# Patient Record
Sex: Male | Born: 1955
Health system: Southern US, Community
[De-identification: ages and names within clinical notes are randomized; demographics above are authoritative.]

## PROBLEM LIST (undated history)

## (undated) DIAGNOSIS — C61 Malignant neoplasm of prostate: Secondary | ICD-10-CM

## (undated) DIAGNOSIS — R911 Solitary pulmonary nodule: Secondary | ICD-10-CM

## (undated) DIAGNOSIS — I5022 Chronic systolic (congestive) heart failure: Secondary | ICD-10-CM

## (undated) DIAGNOSIS — Z923 Personal history of irradiation: Secondary | ICD-10-CM

## (undated) DIAGNOSIS — Z23 Encounter for immunization: Secondary | ICD-10-CM

## (undated) DIAGNOSIS — E785 Hyperlipidemia, unspecified: Secondary | ICD-10-CM

## (undated) DIAGNOSIS — I428 Other cardiomyopathies: Secondary | ICD-10-CM

## (undated) DIAGNOSIS — I509 Heart failure, unspecified: Secondary | ICD-10-CM

## (undated) DIAGNOSIS — I5042 Chronic combined systolic (congestive) and diastolic (congestive) heart failure: Secondary | ICD-10-CM

## (undated) DIAGNOSIS — N189 Chronic kidney disease, unspecified: Secondary | ICD-10-CM

## (undated) DIAGNOSIS — L409 Psoriasis, unspecified: Secondary | ICD-10-CM

## (undated) DIAGNOSIS — F1010 Alcohol abuse, uncomplicated: Secondary | ICD-10-CM

## (undated) DIAGNOSIS — I10 Essential (primary) hypertension: Secondary | ICD-10-CM

## (undated) DIAGNOSIS — M109 Gout, unspecified: Secondary | ICD-10-CM

## (undated) HISTORY — DX: Other cardiomyopathies: I42.8

## (undated) HISTORY — DX: Encounter for immunization: Z23

## (undated) HISTORY — PX: POLYPECTOMY: SHX149

## (undated) HISTORY — PX: SKIN GRAFT: SHX250

## (undated) HISTORY — DX: Personal history of irradiation: Z92.3

## (undated) HISTORY — PX: KNEE SURGERY: SHX244

## (undated) HISTORY — PX: COLONOSCOPY: SHX174

## (undated) HISTORY — DX: Heart failure, unspecified: I50.9

## (undated) HISTORY — DX: Chronic systolic (congestive) heart failure: I50.22

## (undated) HISTORY — DX: Hyperlipidemia, unspecified: E78.5

## (undated) HISTORY — DX: Solitary pulmonary nodule: R91.1

---

## 2004-08-31 ENCOUNTER — Ambulatory Visit (HOSPITAL_COMMUNITY): Admission: RE | Admit: 2004-08-31 | Discharge: 2004-08-31 | Payer: Self-pay | Admitting: *Deleted

## 2012-05-20 ENCOUNTER — Inpatient Hospital Stay (HOSPITAL_COMMUNITY)
Admission: EM | Admit: 2012-05-20 | Discharge: 2012-05-26 | DRG: 287 | Disposition: A | Discharge status: Home / Self Care | Payer: Medicaid Other | Attending: Family Medicine | Admitting: Family Medicine

## 2012-05-20 ENCOUNTER — Emergency Department (HOSPITAL_COMMUNITY): Payer: Medicaid Other

## 2012-05-20 ENCOUNTER — Encounter (HOSPITAL_COMMUNITY): Payer: Self-pay | Admitting: *Deleted

## 2012-05-20 DIAGNOSIS — I440 Atrioventricular block, first degree: Secondary | ICD-10-CM | POA: Diagnosis present

## 2012-05-20 DIAGNOSIS — I509 Heart failure, unspecified: Secondary | ICD-10-CM | POA: Diagnosis present

## 2012-05-20 DIAGNOSIS — I2510 Atherosclerotic heart disease of native coronary artery without angina pectoris: Secondary | ICD-10-CM | POA: Diagnosis present

## 2012-05-20 DIAGNOSIS — IMO0002 Reserved for concepts with insufficient information to code with codable children: Secondary | ICD-10-CM | POA: Diagnosis present

## 2012-05-20 DIAGNOSIS — I5021 Acute systolic (congestive) heart failure: Principal | ICD-10-CM | POA: Diagnosis present

## 2012-05-20 DIAGNOSIS — I5023 Acute on chronic systolic (congestive) heart failure: Secondary | ICD-10-CM

## 2012-05-20 DIAGNOSIS — I10 Essential (primary) hypertension: Secondary | ICD-10-CM | POA: Diagnosis present

## 2012-05-20 DIAGNOSIS — R0789 Other chest pain: Secondary | ICD-10-CM

## 2012-05-20 DIAGNOSIS — R7989 Other specified abnormal findings of blood chemistry: Secondary | ICD-10-CM

## 2012-05-20 DIAGNOSIS — R17 Unspecified jaundice: Secondary | ICD-10-CM | POA: Diagnosis present

## 2012-05-20 DIAGNOSIS — F17200 Nicotine dependence, unspecified, uncomplicated: Secondary | ICD-10-CM | POA: Diagnosis present

## 2012-05-20 DIAGNOSIS — E875 Hyperkalemia: Secondary | ICD-10-CM | POA: Diagnosis not present

## 2012-05-20 DIAGNOSIS — N289 Disorder of kidney and ureter, unspecified: Secondary | ICD-10-CM

## 2012-05-20 DIAGNOSIS — F1020 Alcohol dependence, uncomplicated: Secondary | ICD-10-CM | POA: Diagnosis present

## 2012-05-20 DIAGNOSIS — R945 Abnormal results of liver function studies: Secondary | ICD-10-CM | POA: Diagnosis present

## 2012-05-20 DIAGNOSIS — N179 Acute kidney failure, unspecified: Secondary | ICD-10-CM | POA: Diagnosis present

## 2012-05-20 DIAGNOSIS — K7030 Alcoholic cirrhosis of liver without ascites: Secondary | ICD-10-CM | POA: Diagnosis present

## 2012-05-20 DIAGNOSIS — L408 Other psoriasis: Secondary | ICD-10-CM | POA: Diagnosis present

## 2012-05-20 DIAGNOSIS — R0989 Other specified symptoms and signs involving the circulatory and respiratory systems: Secondary | ICD-10-CM

## 2012-05-20 DIAGNOSIS — R188 Other ascites: Secondary | ICD-10-CM | POA: Diagnosis present

## 2012-05-20 DIAGNOSIS — Z8249 Family history of ischemic heart disease and other diseases of the circulatory system: Secondary | ICD-10-CM

## 2012-05-20 DIAGNOSIS — R9430 Abnormal result of cardiovascular function study, unspecified: Secondary | ICD-10-CM | POA: Diagnosis present

## 2012-05-20 DIAGNOSIS — F1010 Alcohol abuse, uncomplicated: Secondary | ICD-10-CM

## 2012-05-20 DIAGNOSIS — R140 Abdominal distension (gaseous): Secondary | ICD-10-CM | POA: Diagnosis present

## 2012-05-20 HISTORY — DX: Psoriasis, unspecified: L40.9

## 2012-05-20 HISTORY — DX: Alcohol abuse, uncomplicated: F10.10

## 2012-05-20 HISTORY — DX: Essential (primary) hypertension: I10

## 2012-05-20 LAB — COMPREHENSIVE METABOLIC PANEL
ALT: 64 U/L — ABNORMAL HIGH (ref 0–53)
AST: 48 U/L — ABNORMAL HIGH (ref 0–37)
Albumin: 3.6 g/dL (ref 3.5–5.2)
Alkaline Phosphatase: 73 U/L (ref 39–117)
BUN: 25 mg/dL — ABNORMAL HIGH (ref 6–23)
Calcium: 9.7 mg/dL (ref 8.4–10.5)
Chloride: 104 mEq/L (ref 96–112)
GFR calc Af Amer: 53 mL/min — ABNORMAL LOW (ref >90)
Glucose, Bld: 104 mg/dL — ABNORMAL HIGH (ref 70–99)
Potassium: 4.7 mEq/L (ref 3.5–5.1)
Sodium: 142 mEq/L (ref 135–145)
Total Bilirubin: 1.2 mg/dL (ref 0.3–1.2)
Total Protein: 7 g/dL (ref 6.0–8.3)
Total Protein: 7.3 g/dL (ref 6.0–8.3)

## 2012-05-20 LAB — GLUCOSE, CAPILLARY: Glucose-Capillary: 105 mg/dL — ABNORMAL HIGH (ref 70–99)

## 2012-05-20 LAB — CBC
HCT: 48.9 % (ref 39.0–52.0)
Hemoglobin: 16.7 g/dL (ref 13.0–17.0)
WBC: 6.5 10*3/uL (ref 4.0–10.5)

## 2012-05-20 LAB — CBC WITH DIFFERENTIAL/PLATELET
Eosinophils Absolute: 0.2 10*3/uL (ref 0.0–0.7)
Eosinophils Relative: 2 % (ref 0–5)
HCT: 47.4 % (ref 39.0–52.0)
Lymphocytes Relative: 28 % (ref 12–46)
Lymphs Abs: 1.9 10*3/uL (ref 0.7–4.0)
MCH: 30.9 pg (ref 26.0–34.0)
MCV: 89.9 fL (ref 78.0–100.0)
Monocytes Absolute: 0.9 10*3/uL (ref 0.1–1.0)
RBC: 5.27 MIL/uL (ref 4.22–5.81)
RDW: 15.3 % (ref 11.5–15.5)
WBC: 6.7 10*3/uL (ref 4.0–10.5)

## 2012-05-20 LAB — CARDIAC PANEL(CRET KIN+CKTOT+MB+TROPI)
CK, MB: 2 ng/mL (ref 0.3–4.0)
Troponin I: <0.3 ng/mL (ref <0.30)

## 2012-05-20 LAB — PRO B NATRIURETIC PEPTIDE: Pro B Natriuretic peptide (BNP): 3473 pg/mL — ABNORMAL HIGH (ref 0–125)

## 2012-05-20 MED ORDER — SIMVASTATIN 20 MG PO TABS
20.0000 mg | ORAL_TABLET | Freq: Every day | ORAL | Status: DC
Start: 2012-05-21 — End: 2012-05-26
  Administered 2012-05-21 – 2012-05-25 (×5): 20 mg via ORAL
  Filled 2012-05-20 (×6): qty 1

## 2012-05-20 MED ORDER — SODIUM CHLORIDE 0.9 % IJ SOLN
3.0000 mL | Freq: Two times a day (BID) | INTRAMUSCULAR | Status: DC
  Administered 2012-05-20 – 2012-05-26 (×11): 3 mL via INTRAVENOUS

## 2012-05-20 MED ORDER — ASPIRIN 81 MG PO CHEW
324.0000 mg | CHEWABLE_TABLET | Freq: Once | ORAL | Status: AC
  Administered 2012-05-20: 324 mg via ORAL
  Filled 2012-05-20: qty 4

## 2012-05-20 MED ORDER — ALBUTEROL SULFATE HFA 108 (90 BASE) MCG/ACT IN AERS
2.0000 | INHALATION_SPRAY | RESPIRATORY_TRACT | Status: DC
  Administered 2012-05-21 (×3): 2 via RESPIRATORY_TRACT
  Filled 2012-05-20: qty 6.7

## 2012-05-20 MED ORDER — METOPROLOL TARTRATE 1 MG/ML IV SOLN: 5.0000 mg | Freq: Four times a day (QID) | INTRAVENOUS | Status: DC | PRN

## 2012-05-20 MED ORDER — ATENOLOL 100 MG PO TABS
100.0000 mg | ORAL_TABLET | Freq: Every day | ORAL | Status: DC
  Administered 2012-05-21: 100 mg via ORAL
  Filled 2012-05-20: qty 1

## 2012-05-20 MED ORDER — ACETAMINOPHEN 325 MG PO TABS: 650.0000 mg | ORAL_TABLET | ORAL | Status: DC | PRN

## 2012-05-20 MED ORDER — SODIUM CHLORIDE 0.9 % IJ SOLN: 3.0000 mL | INTRAMUSCULAR | Status: DC | PRN

## 2012-05-20 MED ORDER — LISINOPRIL 20 MG PO TABS
20.0000 mg | ORAL_TABLET | Freq: Every day | ORAL | Status: DC
  Administered 2012-05-21 – 2012-05-26 (×6): 20 mg via ORAL
  Filled 2012-05-20 (×6): qty 1

## 2012-05-20 MED ORDER — NITROGLYCERIN 0.4 MG SL SUBL: 0.4000 mg | SUBLINGUAL_TABLET | SUBLINGUAL | Status: DC | PRN

## 2012-05-20 MED ORDER — ASPIRIN EC 81 MG PO TBEC
81.0000 mg | DELAYED_RELEASE_TABLET | Freq: Every day | ORAL | Status: DC
  Administered 2012-05-21 – 2012-05-26 (×5): 81 mg via ORAL
  Filled 2012-05-20 (×6): qty 1

## 2012-05-20 MED ORDER — ONDANSETRON HCL 4 MG/2ML IJ SOLN: 4.0000 mg | Freq: Four times a day (QID) | INTRAMUSCULAR | Status: DC | PRN

## 2012-05-20 MED ORDER — ENOXAPARIN SODIUM 40 MG/0.4ML ~~LOC~~ SOLN
40.0000 mg | SUBCUTANEOUS | Status: DC
  Administered 2012-05-20 – 2012-05-24 (×5): 40 mg via SUBCUTANEOUS
  Filled 2012-05-20 (×6): qty 0.4

## 2012-05-20 MED ORDER — PANTOPRAZOLE SODIUM 40 MG PO TBEC
40.0000 mg | DELAYED_RELEASE_TABLET | Freq: Every day | ORAL | Status: DC
  Administered 2012-05-21 – 2012-05-26 (×6): 40 mg via ORAL
  Filled 2012-05-20 (×6): qty 1

## 2012-05-20 MED ORDER — SODIUM CHLORIDE 0.9 % IV SOLN: 250.0000 mL | INTRAVENOUS | Status: DC | PRN

## 2012-05-20 NOTE — ED Provider Notes (Cosign Needed)
History     CSN: [id number]  Arrival date & time 05/20/12  1524   First MD Initiated Contact with Patient 05/20/12 1655      Chief Complaint  Patient presents with  . Chest Pain  . Shortness of Breath    (Consider location/radiation/quality/duration/timing/severity/associated sxs/prior treatment) Patient is a 56 y.o. male presenting with chest pain and shortness of breath. The history is provided by the patient.  Chest Pain Primary symptoms include shortness of breath. Pertinent negatives for primary symptoms include no fever, no cough and no abdominal pain.    Shortness of Breath  Associated symptoms include chest pain and shortness of breath. Pertinent negatives include no fever and no cough.  pt c/o episodes chest tightness, and sob/exhaustion w minimal activity/exertion for the past month. States occurs w walking a block or less. w rest, symptoms will resolve. No hx cad. States had unspecific heart tests years ago which were okay then. Denies family hx premature cad. Pain when present is not pleuritic. Pain is intermittent, w exertion, lasting several minutes. No cp currently. Denies cough or uri c/o no fever or chills. No leg pain or swelling. No dvt or pe hx. No hx chf.     Past Medical History  Diagnosis Date  . Hypertension     History reviewed. No pertinent past surgical history.  No family history on file.  History  Substance Use Topics  . Smoking status: Current Some Day Smoker  . Smokeless tobacco: Not on file  . Alcohol Use: Yes     daily      Review of Systems  Constitutional: Negative for fever and chills.  HENT: Negative for neck pain.   Eyes: Negative for redness.  Respiratory: Positive for shortness of breath. Negative for cough.   Cardiovascular: Positive for chest pain. Negative for leg swelling.  Gastrointestinal: Negative for abdominal pain.  Genitourinary: Negative for flank pain.  Musculoskeletal: Negative for back pain.  Skin: Negative  for rash.  Neurological: Negative for headaches.  Hematological: Does not bruise/bleed easily.  Psychiatric/Behavioral: Negative for confusion.    Allergies  Review of patient's allergies indicates no known allergies.  Home Medications   Current Outpatient Rx  Name Route Sig Dispense Refill  . ATENOLOL 100 MG PO TABS Oral Take 100 mg by mouth daily.    Marland Kitchen LISINOPRIL 20 MG PO TABS Oral Take 20 mg by mouth daily.      BP 170/124  Pulse 60  Temp 97.8 F (36.6 C) (Oral)  Resp 21  SpO2 98%  Physical Exam  Nursing note and vitals reviewed. Constitutional: He is oriented to person, place, and time. He appears well-developed and well-nourished. No distress.  HENT:  Head: Atraumatic.  Nose: Nose normal.  Eyes: Conjunctivae are normal. Pupils are equal, round, and reactive to light.  Neck: Neck supple. No tracheal deviation present. No thyromegaly present.  Cardiovascular: Normal rate, regular rhythm, normal heart sounds and intact distal pulses.  Exam reveals no gallop and no friction rub.   No murmur heard. Pulmonary/Chest: Effort normal and breath sounds normal. No accessory muscle usage. No respiratory distress. He exhibits no tenderness.  Abdominal: Soft. Bowel sounds are normal. He exhibits no distension. There is no tenderness.  Musculoskeletal: Normal range of motion. He exhibits no edema and no tenderness.  Neurological: He is alert and oriented to person, place, and time.  Skin: Skin is warm and dry.  Psychiatric: He has a normal mood and affect.    ED Course  Procedures (including critical care time)  Labs Reviewed  CBC WITH DIFFERENTIAL - Abnormal; Notable for the following:    Monocytes Relative 13 (*)     All other components within normal limits  COMPREHENSIVE METABOLIC PANEL      MDM  Iv ns. Chewable asa. Labs. Cxr.   Date: 05/20/2012  Rate: 58  Rhythm: sinus bradycardia  QRS Axis: normal  Intervals: PR prolonged  ST/T Wave abnormalities: nonspecific  T wave changes  Conduction Disutrbances:first-degree A-V block   Narrative Interpretation:   Old EKG Reviewed: none available          Suzi Roots, MD 05/25/12 [phone]

## 2012-05-20 NOTE — Progress Notes (Signed)
PGY2 Progress Note:  CMet at admission showed K+ of 5.8. Prior value was 4.7 a few hours previously. Spoke with lab about the result and tech states there was no hemolysis. We will recheck Bmet to monitor K+. Patient is on telemetry currently. For now, I do not think he needs emergent treatment of his potassium. I will order Albuterol for him since he had dyspnea anyway. If next lab shows higher value, then more aggressive treatment will be necessary.  Yanni Ruberg M. Stace Peace, M.D. 05/20/2012 11:13 PM

## 2012-05-20 NOTE — ED Notes (Signed)
Pt to and from x-ray

## 2012-05-20 NOTE — ED Notes (Signed)
Pt is here with chest pressure and states DOE with walking about 50 feet.  PT states not feeling well for one month.  PT  Feels like head has a lot of pressure in it and light headed with walking.

## 2012-05-20 NOTE — H&P (Signed)
History and Physical Note Family Medicine Teaching Service Tony Name M. Lygia Olaes, MD Service Pager: [phone]  [name] is an 56 y.o. male.   Chief Complaint: Shortness of breath HPI: Patient is a 56 yo M with PMH of HTN who presented to the ED for one history of progressively worsening dyspnea and chest discomfort with exertion. Patient has history of HTN, which based on vitals since arrival, appears to be uncontrolled but otherwise he has no medical problems. This is a new problem for him and he has never experienced it before this episode which has lasted 7 days. He now states he cannot walk 20 feet without having SOB and chest tightness. It resolves with rest, and never comes while he is at rest. States the discomfort starts above his stomach and goes up to sides of his neck and felt like his head was swelling up. He said he felt like he was going to pass out. He does not take any medications for the pain. No known PMH of CAD although he states did have a cardiac workup a few years ago that was unremarkable. 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 Eating does not change the pain. Recently he cannot sleep flat, he has been using more pillows to help him breathe better. Had a recent cold 2 weeks ago with congestion and cough without fevers/chills.   Takes Atenolol and Lisinopril as an outpatient for his HTN. Until last week, he was not taking either of these for approx 6 months. Since then, he has been taking them daily. Denies any OTC medications.   In the ED, he had a negative POCT troponin. He had an EKG which showed flipped T waves in the lateral leads but no old EKG to compare it to. He did not require O2, but he was hypertensive to 170/120. Family medicine was called for admission for rule-out and possible further cardiac testing.   Past Medical History  Diagnosis Date  .  Hypertension    Surgical History: Left knee surgery in high school  Family History: Dad died in 79 from heart failure (age 38). Mother still living overall doing well at age 70. Uncle died in 36's from CHF  Social History: Lives alone. Truck driver, but typically short distances. Smokes Black and Milds occasionally. Drinks beer 2-3/day.  Allergies: No Known Allergies  Prior to Admission medications   Medication Sig Start Date End Date Taking? Authorizing Provider  atenolol (TENORMIN) 100 MG tablet Take 100 mg by mouth daily.   Yes Historical Provider, MD  lisinopril (PRINIVIL,ZESTRIL) 20 MG tablet Take 20 mg by mouth daily.   Yes Historical Provider, MD    Results for orders placed during the hospital encounter of 05/20/12 (from the past 48 hour(s))  COMPREHENSIVE METABOLIC PANEL     Status: Abnormal   Collection Time   05/20/12  4:02 PM      Component Value Range Comment   Sodium 139  135 - 145 mEq/L    Potassium 4.7  3.5 - 5.1 mEq/L    Chloride 104  96 - 112 mEq/L    CO2 25  19 - 32 mEq/L    Glucose, Bld 142 (*) 70 - 99 mg/dL    BUN 25 (*) 6 - 23 mg/dL    Creatinine, Ser 4.54 (*) 0.50 - 1.35 mg/dL    Calcium 9.3  8.4 - 09.8 mg/dL  Total Protein 7.0  6.0 - 8.3 g/dL    Albumin 3.6  3.5 - 5.2 g/dL    AST 48 (*) 0 - 37 U/L    ALT 65 (*) 0 - 53 U/L    Alkaline Phosphatase 73  39 - 117 U/L    Total Bilirubin 1.2  0.3 - 1.2 mg/dL    GFR calc non Af Amer 45 (*) >90 mL/min    GFR calc Af Amer 52 (*) >90 mL/min   CBC WITH DIFFERENTIAL     Status: Abnormal   Collection Time   05/20/12  4:02 PM      Component Value Range Comment   WBC 6.7  4.0 - 10.5 K/uL    RBC 5.27  4.22 - 5.81 MIL/uL    Hemoglobin 16.3  13.0 - 17.0 g/dL    HCT 40.9  81.1 - 91.4 %    MCV 89.9  78.0 - 100.0 fL    MCH 30.9  26.0 - 34.0 pg    MCHC 34.4  30.0 - 36.0 g/dL    RDW 78.2  95.6 - 21.3 %    Platelets 206  150 - 400 K/uL    Neutrophils Relative 56  43 - 77 %    Neutro Abs 3.7  1.7 - 7.7 K/uL     Lymphocytes Relative 28  12 - 46 %    Lymphs Abs 1.9  0.7 - 4.0 K/uL    Monocytes Relative 13 (*) 3 - 12 %    Monocytes Absolute 0.9  0.1 - 1.0 K/uL    Eosinophils Relative 2  0 - 5 %    Eosinophils Absolute 0.2  0.0 - 0.7 K/uL    Basophils Relative 1  0 - 1 %    Basophils Absolute 0.1  0.0 - 0.1 K/uL   POCT I-STAT TROPONIN I     Status: Normal   Collection Time   05/20/12  5:18 PM      Component Value Range Comment   Troponin i, poc 0.02  0.00 - 0.08 ng/mL    Comment 3            TROPONIN I     Status: Normal   Collection Time   05/20/12  5:30 PM      Component Value Range Comment   Troponin I <0.30  <0.30 ng/mL    Dg Chest 2 View  05/20/2012  *RADIOLOGY REPORT*  Clinical Data: Shortness of breath and headache.  CHEST - 2 VIEW  Comparison: None.  Findings: Trachea is midline.  Heart is enlarged.  Pulmonary arteries appear prominent.  Lungs are low in volume with probable vascular crowding rather than mild edema.  Tiny right pleural effusion.  Left hemidiaphragm is elevated with adjacent volume loss, likely chronic in nature.  Difficult to exclude tiny left pleural effusion.  IMPRESSION:  1.  Tiny right pleural effusion. 2.  Probable vascular crowding in the lungs, rather than true edema, in the setting of low lung volumes. 3.  Question pulmonary arterial hypertension.   Original Report Authenticated By: Reyes Ivan, M.D.    ROS Negative except as mentioned in HPI above  Blood pressure 125/79, pulse 60, temperature 98.2 F (36.8 C), temperature source Oral, resp. rate 18, SpO2 100.00%. Physical Exam  Constitutional: He is oriented to person, place, and time. He appears well-developed and well-nourished. No distress.  HENT:  Head: Normocephalic and atraumatic.  Mouth/Throat: Oropharynx is clear and moist.  Eyes: Pupils are equal,  round, and reactive to light.       Yellowing of sclera  Neck: Normal range of motion.  Cardiovascular: Normal rate and regular rhythm.   No murmur  heard. Respiratory: Effort normal and breath sounds normal. No respiratory distress. He has no wheezes.  GI: Soft. There is no tenderness.       Obese  Musculoskeletal: Normal range of motion. He exhibits no edema.  Lymphadenopathy:    He has no cervical adenopathy.  Neurological: He is alert and oriented to person, place, and time. No cranial nerve deficit.  Skin: Skin is warm and dry.       Hypopigmentation of lips, and fingers. Psoriasis plaques of all extremities, chest and back    Assessment/Plan 56 yo M presenting with progressively worsening dyspnea and chest discomfort x 1 week.   # Dyspnea- Although etiology is not certain differential diagnoses include ACS, new onset CHF, post-viral myocarditis, GERD and PE.  - Admit to telemetry, attending Dr. Jennette Kettle - Cycle cardiac enzymes x3 - EKG on admission, and repeat in the morning - Echocardiogram to evaluate EF as well as for myocarditis - Will check Pro-BNP - Risk stratification with A1C, TSH and fasting lipid panel - Start ASA 81mg  - Blood pressure control - Will also start Protonix daily - Supplemental O2 as needed - Will monitor daily weights and Ins/Outs and consider diuresis, if needed, although does not appear fluid overloaded on admission  # HTN- Uncontrolled but he has not been taking medications for 6 months - Restart home Metoprolol and Lisinopril - Hydralazine prn for hypertension - Monitor for chest pain  # FEN/GI- heart healthy diet  # PPx- Lovenox, Protonix  # Dispo- Pending further work up and clinical improvement. Will need to have close PCP follow up as an outpatient. Patient updated on plan at admission.  Code: Full PCP: [facility]  Deon Duer 05/20/2012, 9:02 PM

## 2012-05-20 NOTE — ED Notes (Signed)
MD at bedside. 

## 2012-05-21 ENCOUNTER — Encounter (HOSPITAL_COMMUNITY): Payer: Self-pay | Admitting: Physician Assistant

## 2012-05-21 ENCOUNTER — Inpatient Hospital Stay (HOSPITAL_COMMUNITY): Payer: Medicaid Other

## 2012-05-21 DIAGNOSIS — I10 Essential (primary) hypertension: Secondary | ICD-10-CM | POA: Diagnosis present

## 2012-05-21 DIAGNOSIS — I5021 Acute systolic (congestive) heart failure: Secondary | ICD-10-CM | POA: Diagnosis present

## 2012-05-21 DIAGNOSIS — I509 Heart failure, unspecified: Secondary | ICD-10-CM

## 2012-05-21 DIAGNOSIS — F1010 Alcohol abuse, uncomplicated: Secondary | ICD-10-CM | POA: Diagnosis present

## 2012-05-21 DIAGNOSIS — IMO0002 Reserved for concepts with insufficient information to code with codable children: Secondary | ICD-10-CM | POA: Diagnosis present

## 2012-05-21 DIAGNOSIS — N289 Disorder of kidney and ureter, unspecified: Secondary | ICD-10-CM | POA: Diagnosis present

## 2012-05-21 DIAGNOSIS — R140 Abdominal distension (gaseous): Secondary | ICD-10-CM | POA: Diagnosis present

## 2012-05-21 DIAGNOSIS — R9430 Abnormal result of cardiovascular function study, unspecified: Secondary | ICD-10-CM | POA: Diagnosis present

## 2012-05-21 DIAGNOSIS — R0602 Shortness of breath: Secondary | ICD-10-CM

## 2012-05-21 DIAGNOSIS — R7989 Other specified abnormal findings of blood chemistry: Secondary | ICD-10-CM | POA: Diagnosis present

## 2012-05-21 LAB — CARDIAC PANEL(CRET KIN+CKTOT+MB+TROPI)
CK, MB: 1.7 ng/mL (ref 0.3–4.0)
Troponin I: <0.3 ng/mL (ref <0.30)
Troponin I: <0.3 ng/mL (ref <0.30)

## 2012-05-21 LAB — LIPID PANEL
Cholesterol: 115 mg/dL (ref 0–200)
HDL: 30 mg/dL — ABNORMAL LOW (ref >39)
Total CHOL/HDL Ratio: 3.8 RATIO

## 2012-05-21 LAB — CBC
Platelets: 206 10*3/uL (ref 150–400)
RDW: 15.4 % (ref 11.5–15.5)
WBC: 6.2 10*3/uL (ref 4.0–10.5)

## 2012-05-21 LAB — BASIC METABOLIC PANEL
Chloride: 105 mEq/L (ref 96–112)
GFR calc Af Amer: 55 mL/min — ABNORMAL LOW (ref >90)
Potassium: 4.5 mEq/L (ref 3.5–5.1)
Sodium: 139 mEq/L (ref 135–145)

## 2012-05-21 LAB — HEMOGLOBIN A1C
Hgb A1c MFr Bld: 5.7 % — ABNORMAL HIGH (ref <5.7)
Mean Plasma Glucose: 117 mg/dL — ABNORMAL HIGH (ref <117)

## 2012-05-21 MED ORDER — TECHNETIUM TO 99M ALBUMIN AGGREGATED
3.0000 | Freq: Once | INTRAVENOUS | Status: AC | PRN
  Administered 2012-05-21: 3 via INTRAVENOUS

## 2012-05-21 MED ORDER — CARVEDILOL 6.25 MG PO TABS
6.2500 mg | ORAL_TABLET | Freq: Two times a day (BID) | ORAL | Status: DC
  Administered 2012-05-22: 6.25 mg via ORAL
  Filled 2012-05-21 (×4): qty 1

## 2012-05-21 MED ORDER — ALBUTEROL SULFATE HFA 108 (90 BASE) MCG/ACT IN AERS
2.0000 | INHALATION_SPRAY | Freq: Three times a day (TID) | RESPIRATORY_TRACT | Status: DC
  Administered 2012-05-21 – 2012-05-24 (×9): 2 via RESPIRATORY_TRACT
  Filled 2012-05-21: qty 6.7

## 2012-05-21 MED ORDER — FUROSEMIDE 10 MG/ML IJ SOLN
80.0000 mg | Freq: Once | INTRAMUSCULAR | Status: AC
  Administered 2012-05-21: 80 mg via INTRAVENOUS
  Filled 2012-05-21: qty 8

## 2012-05-21 MED ORDER — CARVEDILOL 6.25 MG PO TABS
6.2500 mg | ORAL_TABLET | Freq: Two times a day (BID) | ORAL | Status: DC
  Filled 2012-05-21 (×2): qty 1

## 2012-05-21 NOTE — Progress Notes (Signed)
Clinical Child psychotherapist (CSW) received a referral for medication assistance. CSW informed MD of referral needing to be for [facility]. CSW informed Unit CM of consult. CSW signing off but available if pt presents with any CSW needs.  Theresia Bough, MSW, Theresia Majors [phone]

## 2012-05-21 NOTE — H&P (Signed)
FMTS Attending Admission Note: Tony Levy MD [phone] pager office [phone] I  have seen and examined this patient, reviewed their chart. I have discussed this patient with the resident. I agree with the resident's findings, assessment and care plan. His symptoms are for about 2 weeks, worsening in last 1 week. His BNP is elevated. I think in the differential is pericarditis, myocarditis---both viral and other etiology such as alcohol, perhaps sarcoid (althoughhe has no known dx and no family members and sarcoid rarely affects heart--it is still in differential.); also unusual etiology such as amyloid etc. ECHO is pending. Unless that is extremely enlightening we will likely need cardiology consult. EKG is unusual with TWI---we have no old to compare with---and QTC slightly elevated. Formal cardiology  Reading is pending. 2. Regarding hi sclera--mild icterus. I agree with further workup ---and would consider hepatitis panel etc if the ECHO unrevealing.  3.  Pulmonary embolus is almost always in the differential with DOE, but I feel like it is very low on the list at this time. We are providing DVT prophylaxis. 4.Noted is vitiligo of skin.

## 2012-05-21 NOTE — Progress Notes (Signed)
Family Medicine Teaching Service Daily Progress Note Service Page: [phone]  Subjective:  Patient continues to report SOB with exertion and some associated chest discomfort. Patient is unable to sleep flat and has been using several pillows at night. No other complaints this am.  Objective: Temp:  [97.8 F (36.6 C)-98.2 F (36.8 C)] 97.8 F (36.6 C) (08/22 0800) Pulse Rate:  [55-115] 58  (08/22 0800) Resp:  [14-27] 17  (08/22 0800) BP: (125-171)/(79-124) 147/92 mmHg (08/22 0800) SpO2:  [92 %-100 %] 94 % (08/22 0800) Weight:  [201 lb (91.173 kg)] 201 lb (91.173 kg) (08/21 2100)  Exam: General: awake, alert, out of bed this am. Cardiovascular: Bradycardic.  Regular rhythm. No murmurs, rubs, or gallops auscultated. Respiratory: CTAB. No rales, rhonchi, or wheeze. Abdomen: soft, nontender, nondistended. +BS Extremities: 1+ pitting ankle edema bilaterally.   Skin: hypopigmentation of lips and fingers noted.  Psoriasis plaques noted on chest/back and extremities.  I have reviewed the patient's medications, labs, imaging, and diagnostic testing.  Notable results are summarized below.  CBC BMET   Lab 05/21/12 0200 05/20/12 2057 05/20/12 1602  WBC 6.2 6.5 6.7  HGB 15.5 16.7 16.3  HCT 45.3 48.9 47.4  PLT 206 216 206    Lab 05/21/12 0200 05/20/12 2057 05/20/12 1602  NA 139 142 139  K 4.5 5.8* 4.7  CL 105 105 104  CO2 22 25 25   BUN 26* 26* 25*  CREATININE 1.56* 1.61* 1.64*  GLUCOSE 105* 104* 142*  CALCIUM 9.0 9.7 9.3     Imaging/Diagnostic Tests:  Lipid Panel     Component Value Date/Time   CHOL 115 05/21/2012 0200   TRIG 75 05/21/2012 0200   HDL 30* 05/21/2012 0200   CHOLHDL 3.8 05/21/2012 0200   VLDL 15 05/21/2012 0200   LDLCALC 70 05/21/2012 0200    Lab 05/20/12 2057  TSH 2.362   ProBNP - 3473  Cardiac Panel (last 3 results)  Basename 05/21/12 0245 05/20/12 2057 05/20/12 1730  CKTOTAL 79 90 --  CKMB 1.9 2.0 --  TROPONINI <0.30 <0.30 <0.30  RELINDX RELATIVE  INDEX IS INVALID RELATIVE INDEX IS INVALID --   EKG: sinus bradycardia with 1st degree AV block, LVH, prolonged QT, anterolateral t wave inversion (V3-V6), inferior lead t wave inversion  Chest 2 View  05/20/2012   IMPRESSION:  1.  Tiny right pleural effusion. 2.  Probable vascular crowding in the lungs, rather than true edema, in the setting of low lung volumes. 3.  Question pulmonary arterial hypertension.   ECHO   - Left ventricle: The cavity size was mildly dilated. Wall thickness was normal. The estimated ejection fraction was 15%. Diffuse hypokinesis. Features are consistent with a pseudonormal left ventricular filling pattern, with concomitant abnormal relaxation and increased filling pressure (grade 2 diastolic dysfunction). - Mitral valve: Calcified annulus. - Left atrium: The atrium was mildly dilated. - Right ventricle: Systolic function was moderately reduced. - Right atrium: The atrium was mildly dilated. - Pulmonary arteries: Systolic pressure was mildly increased. PA peak pressure: 44mm Hg (S).  Assessment/Plan: 56 yo M presenting with progressively worsening dyspnea and chest discomfort x 1 week.   # Dyspnea - DDX: CHF, PE, post viral myocarditis, sarcoidosis, ACS - Cardiac enzymes negative x 3 making ACS highly unlikely - Elevated ProBNP of 3473.   - EKG this am revealed: sinus bradycardia with 1st degree AV block, LVH, prolonged QT, anterolateral t wave inversion (V3-V6), inferior lead t wave inversion - D dimer ordered for potential PE.  If positive will get VQ scan given elevated creatinine - Echo obtained today showed: EF 15 %, diffuse hypokinesis, grade 2 diastolic dysfunction - Will consult cardiology today  # HTN- Uncontrolled but he has not been taking medications for 6 months  - Restart home Metoprolol and Lisinopril  - Hydralazine prn for hypertension   # FEN/GI- heart healthy diet  # PPx- Lovenox, Protonix  # Dispo- Pending further work up and  clinical improvement. Will need to have close PCP follow up as an outpatient.  # Code Status: Full code  Everlene Other, DO 05/21/2012, 8:56 AM

## 2012-05-21 NOTE — Consult Note (Addendum)
CARDIOLOGY CONSULT NOTE  Patient ID: [name], MRN: [id number], DOB/AGE: 58-24-1957 58 y.o. 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 He reports progressive dyspnea with exertion to the point where he cannot walk more than 20 feet without having to stop and rest. He also has substernal chest discomfort/achiness when he walks. He also experiences this chest discomfort when lying flat and describes orthopnea. These symptoms resolve within 5-10 minutes of resting. He denies any rest chest pain. He mostly drives locally for his job but goes out of state 1x/month including a trip to [place] last month. His brother was recently diagnosed with a blood clot, further details unknown. He had been out of his lisinopril and atenolol for 6 months but restarted last week. Initially he thought he had chest congestion 2 weeks ago but now believes this was this current illness brewing (no fevers or chills). He says he's been told he looks like he lost about 20-30 lbs but the scale has told him -5lbs. He has not been trying to lose weight. He denies LEE (has trace), BRBPR, melena, hemoptysis, hematemesis, or syncope.  He was hypertensive to 170/120 on admission. CE's have been negative x 4. D-dimer is elevated at 3.15. VQ scan is pending as he also had some renal insufficiency on admission with BUN/Cr 26/1.56 by last labs (Cr 1.64 on admission). LFTs mildly elevated at 44/64. pBNP 3473. CXR showed enlarged heart, tiny R pleural effusion, probable vascular crowding, question pulmonary arterial hypertension. 2D Echo was obtained demonstrating  EF 15%, grade 2 diastolic dysfuntion, moderately reduced RV systolic function, elevated PA pressure of . EKG shows sinus bradycardia 1st degree AVB with LVH & infero/anterolateral T wave inversion with no prior to compare to. He has been continued on his atenolol, lisinopril, with addition of aspirin, zocor, and protonix. Most recent BP 140/80.  Past Medical History  Diagnosis Date  . Hypertension   . Psoriasis     Possible psoriasis in the past     Most Recent Cardiac Studies: 2D Echo Study Conclusions THIS ADMISSION - Left ventricle: The cavity size was mildly dilated. Wall thickness was normal. The estimated ejection fraction was 15%. Diffuse hypokinesis. Features are consistent with a pseudonormal left ventricular filling pattern, with concomitant abnormal relaxation and increased filling pressure (grade 2 diastolic dysfunction). - Mitral valve: Calcified annulus. - Left atrium: The atrium was mildly dilated. - Right ventricle: Systolic function was moderately reduced. - Right atrium: The atrium was mildly dilated. - Pulmonary arteries: Systolic pressure was mildly increased. PA peak pressure: 44mm Hg (S).   Surgical History:  Past Surgical History  Procedure Date  . Skin graft     L arm got caught in machine in 1979     Home Meds: Prior to Admission medications   Medication Sig Start Date End Date Taking? Authorizing Provider  atenolol (TENORMIN) 100 MG tablet Take 100 mg by mouth daily.   Yes Historical Provider, MD  lisinopril (PRINIVIL,ZESTRIL) 20 MG tablet Take 20 mg by mouth daily.   Yes Historical Provider, MD    Inpatient Medications:     . albuterol  2 puff Inhalation TID  . aspirin  324 mg Oral Once  . aspirin EC  81 mg Oral Daily  . atenolol  100 mg Oral Daily  . enoxaparin (LOVENOX) injection  40 mg Subcutaneous Q24H  . lisinopril  20 mg Oral Daily  . pantoprazole  40 mg Oral Q1200  . simvastatin  20 mg Oral q1800  . sodium chloride  3 mL Intravenous  Q12H  . DISCONTD: albuterol  2 puff Inhalation Q4H    Allergies: No Known Allergies  History   Social History  . Marital Status: Legally Separated    Spouse Name: N/A    Number of Children: N/A  . Years of Education: N/A   Occupational History  . Not on file.   Social History Main Topics  . Smoking status: Current Some Day Smoker    Types: Cigars  . Smokeless tobacco: Not on file   Comment: Rare cigar  . Alcohol Use: Yes     3-4 12oz beers per day  . Drug Use: No  . Sexually Active: Not on file   Other Topics Concern  . Not on file   Social History Narrative   Works as a Naval architect.     Family History  Problem Relation Age of Onset  . Heart failure Father   . Heart failure Other     Uncle. Possible MI?     Review of Systems: General: negative for chills, fever, night sweats. See above  Cardiovascular: see above Dermatological: reports he has been tx in past for psoriasis (he isnt sure if he had a biopsy) but could not afford medicines. Has also had lightening of hands Respiratory: negative for cough or wheezing Urologic: negative for hematuria Abdominal: negative for nausea, vomiting, diarrhea, bright red blood per rectum, melena, or hematemesis Neurologic: negative for visual changes, syncope, or dizziness All other systems reviewed and are otherwise negative except as noted above.  Labs:  [facility] 05/21/12 0901 05/21/12 0245 05/20/12 2057 05/20/12 1730  CKTOTAL 95 79 90 --  CKMB 1.7 1.9 2.0 --  TROPONINI <0.30 <0.30 <0.30 <0.30   Lab Results  Component Value Date   WBC 6.2 05/21/2012   HGB 15.5 05/21/2012   HCT 45.3 05/21/2012   MCV 89.5 05/21/2012   PLT 206 05/21/2012     Lab 05/21/12 0200 05/20/12 2057  NA 139 --  K 4.5 --  CL 105 --  CO2 22 --  BUN 26* --  CREATININE 1.56* --  CALCIUM 9.0 --  PROT -- 7.3  BILITOT -- 1.5*  ALKPHOS -- 76  ALT -- 64*  AST -- 44*  GLUCOSE 105* --   Lab Results  Component Value Date   CHOL 115 05/21/2012    HDL 30* 05/21/2012   LDLCALC 70 05/21/2012   TRIG 75 05/21/2012   Lab Results  Component Value Date   DDIMER 3.15* 05/21/2012    Radiology/Studies:  Dg Chest 2 View 05/20/2012  *RADIOLOGY REPORT*  Clinical Data: Shortness of breath and headache.  CHEST - 2 VIEW  Comparison: None.  Findings: Trachea is midline.  Heart is enlarged.  Pulmonary arteries appear prominent.  Lungs are low in volume with probable vascular crowding rather than mild edema.  Tiny right pleural effusion.  Left hemidiaphragm is elevated with adjacent volume loss, likely chronic in nature.  Difficult to exclude tiny left pleural effusion.  IMPRESSION:  1.  Tiny right pleural effusion. 2.  Probable vascular crowding in the lungs, rather than true  edema, in the setting of low lung volumes. 3.  Question pulmonary arterial hypertension.   Original Report Authenticated By: Reyes Ivan, M.D.    EKG:  8/21: sinus bradycardia 1st degree AVB 58bpm, LVH with TWI I, II, V4-V6. J pt elevation V4, nonspecific T wave changes III & avF. QTc 486 8/22: sinus bradycardia 1st degree AVB 56bpm, LVH with TWI I, II, avF, avL, V3-V6 (deepest at 2mm V2), no acute ST changes. QTc 490  Physical Exam: Blood pressure 140/80, pulse 61, temperature 98.1 F (36.7 C), temperature source Oral, resp. rate 17, height 5\' 11"  (1.803 m), weight 201 lb (91.173 kg), SpO2 95.00%. General: Well developed, well nourished AAM in no acute distress. Head: Normocephalic, atraumatic, no xanthomas, nares are without discharge. Scleral icteris is present. No macroglossia. Neck: Negative for carotid bruits. JVD is moderately elevated. Lungs: Bilateral crackles at bases. No wheezes or rhonchi. Breathing is unlabored. He does get dyspneic with recline of bed. Heart: RRR with S1 S2. No murmurs, rubs, or gallops appreciated. Abdomen: Soft, non-tender but protuberant and rounded with normoactive bowel sounds. No rebound/guarding. No obvious abdominal masses. Msk:  Strength  and tone appear normal for age. Extremities: No clubbing or cyanosis. Tr sockline edema.  Distal pedal pulses are 2+ and equal bilaterally. Skin: He has a patchy area of toughened skin on his L upper arm which he attributes to prior grafting. However, he also has various areas of patchy thickening without overt lichenification ranging in size from small and macular to patches. This is present on extremities and abdomen. No suppuration. He also has vitiliginous lightening of his hands and lips.  Neuro: Alert and oriented X 3. Moves all extremities spontaneously. Psych:  Responds to questions appropriately with a normal affect.   Assessment and Plan:   1. Dyspnea - may be multifactorial in the setting of newly diagnosed CHF but also quite concerning for pulmonary embolism. See below. 2. Newly diagnosed systolic congestive heart failure - would favor changing atenolol to Coreg 6.25mg  BID given low EF and renal function. Continue ACEI. Give Lasix 80mg  IV tonight and reassess Cr/volume in AM. We need to clarify ischemic versus nonischemic etiology in this gentleman given his HTN & family history, although this may be nonischemic due to EtOH use. Infiltrative cardiomyopathy is also a consideration although speckling not seen on 2D echo. Will consider cardiac catheterization to definitively rule out coronary disease based on what his creatinine does with diuresis. Will order abd [place] to r/o ascites. 3. Elevated d-dimer with recent long distance travel - agree with VQ scan to rule out PE given RV systolic dysfunction, elevated PA pressure, recent trip to , & family history of blood clot. If negative, would still consider LE dopplers to r/o DVT. 4. HTN - BP improved. We reinforced medication compliance today and he seems to understand. 5. Elevated transaminases with scleral icterus - primary team is considering w/u for hepatitis per their note, agree with this although this may represent alcoholic  hepatitis. Zocor has been added and would recommend to follow LFT's closely in this case.  6. EtOH abuse - consider CIWA protocol initiation. Watch LFTs with statin. Will need to abstain from alcohol completely. Check abd [place]. 7. Dermatoses (? Possible history of psoriasis) - might need dermatology eval to exclude infiltrative disease. 8. Acute renal insufficiency - watch Cr with diuresis. Question secondary to hypertensive nephropathy.   Signed, Ronie Spies PA-C 05/21/2012, 4:13 PM Patient seen and examined. I agree with the assessment and  plan as detailed above. See also my additional thoughts below.   Patient is seen and examined. His son was in the room. I have reviewed all the data with Ronie Spies PA-C. At this time he has CHF with volume overload related to his poor left ventricular function. The etiology is not yet clear. He does drink significant alcohol. We do not know if he has coronary disease. We will start to diuresis him and try to use appropriate medications for left ventricular dysfunction. I am hopeful that his renal function will improve. If this is the case we will be able to proceed with cardiac catheterization to rule in or out coronary disease.   It is important that pulmonary emboli be ruled out in this is to be done.   I had a long discussion with the patient and his son. It is extremely important that he have total abstinence from alcohol going forward.   I am hopeful that with diuresis his renal function will improve.  Willa Rough, MD, [facility] 05/21/2012 4:20 PM

## 2012-05-21 NOTE — Progress Notes (Addendum)
  Echocardiogram 2D Echocardiogram has been performed.  Jorje Guild 05/21/2012, 11:32 AM

## 2012-05-22 ENCOUNTER — Encounter (HOSPITAL_COMMUNITY): Payer: Self-pay | Admitting: Physician Assistant

## 2012-05-22 DIAGNOSIS — N289 Disorder of kidney and ureter, unspecified: Secondary | ICD-10-CM

## 2012-05-22 DIAGNOSIS — I10 Essential (primary) hypertension: Secondary | ICD-10-CM

## 2012-05-22 DIAGNOSIS — I5041 Acute combined systolic (congestive) and diastolic (congestive) heart failure: Secondary | ICD-10-CM

## 2012-05-22 LAB — BASIC METABOLIC PANEL
BUN: 23 mg/dL (ref 6–23)
CO2: 28 mEq/L (ref 19–32)
Calcium: 8.9 mg/dL (ref 8.4–10.5)
GFR calc non Af Amer: 41 mL/min — ABNORMAL LOW (ref >90)
Glucose, Bld: 80 mg/dL (ref 70–99)
Potassium: 4.2 mEq/L (ref 3.5–5.1)

## 2012-05-22 MED ORDER — CARVEDILOL 3.125 MG PO TABS
3.1250 mg | ORAL_TABLET | Freq: Two times a day (BID) | ORAL | Status: DC
  Administered 2012-05-22 – 2012-05-24 (×5): 3.125 mg via ORAL
  Filled 2012-05-22 (×9): qty 1

## 2012-05-22 MED ORDER — SPIRONOLACTONE 12.5 MG HALF TABLET
12.5000 mg | ORAL_TABLET | Freq: Every day | ORAL | Status: DC
  Administered 2012-05-22 – 2012-05-23 (×2): 12.5 mg via ORAL
  Filled 2012-05-22 (×2): qty 1

## 2012-05-22 MED ORDER — ISOSORBIDE MONONITRATE ER 30 MG PO TB24
30.0000 mg | ORAL_TABLET | Freq: Every day | ORAL | Status: DC
  Administered 2012-05-22 – 2012-05-26 (×5): 30 mg via ORAL
  Filled 2012-05-22 (×5): qty 1

## 2012-05-22 MED ORDER — FUROSEMIDE 10 MG/ML IJ SOLN
80.0000 mg | Freq: Two times a day (BID) | INTRAMUSCULAR | Status: DC
  Administered 2012-05-22 (×2): 80 mg via INTRAVENOUS
  Filled 2012-05-22 (×5): qty 8

## 2012-05-22 MED ORDER — ENSURE COMPLETE PO LIQD
237.0000 mL | Freq: Every day | ORAL | Status: DC
  Administered 2012-05-23 – 2012-05-25 (×3): 237 mL via ORAL

## 2012-05-22 MED ORDER — DIGOXIN 250 MCG PO TABS
0.2500 mg | ORAL_TABLET | Freq: Every day | ORAL | Status: DC
  Administered 2012-05-22 – 2012-05-26 (×5): 0.25 mg via ORAL
  Filled 2012-05-22 (×5): qty 1

## 2012-05-22 MED ORDER — HYDRALAZINE HCL 25 MG PO TABS
12.5000 mg | ORAL_TABLET | Freq: Three times a day (TID) | ORAL | Status: DC
  Administered 2012-05-22 – 2012-05-23 (×3): 12.5 mg via ORAL
  Filled 2012-05-22 (×6): qty 0.5

## 2012-05-22 NOTE — Care Management Note (Signed)
    Page 1 of 2   05/26/2012     11:51:48 AM   CARE MANAGEMENT NOTE 05/26/2012  Patient:  [facility]   Account Number:  [id number]  Date Initiated:  05/22/2012  Documentation initiated by:  GRAVES-BIGELOW,Roan Miklos  Subjective/Objective Assessment:   Pt admitted with SOB. Ef 15 %. New onset CHF. 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 Daughter stated that pt has PCP in [place].     Action/Plan:   Pt has been noncompliant with medicaitons and will benefit from [facility] RN for disease/medication management. It would be great if he could be f/u in the HF clinic due to no inurance and will need assistance with medications.   Anticipated DC Date:  05/26/2012   Anticipated DC Plan:  HOME W HOME HEALTH SERVICES      DC Planning Services  CM consult  Medication Assistance  Indigent [facility]      [facility] Choice  HOME HEALTH   Choice offered to / List presented to:  C-1 Patient        HH arranged  HH-1 RN  HH-10 DISEASE MANAGEMENT      HH agency  [facility]   Status of service:  Completed, signed off Medicare Important Message given?   (If response is "NO", the following Medicare IM given date fields will be blank) Date Medicare IM given:   Date Additional Medicare IM given:    Discharge Disposition:  HOME W HOME HEALTH SERVICES  Per UR Regulation:  Reviewed for med. necessity/level of care/duration of stay  If discussed at Long Length of Stay Meetings, dates discussed:   05/26/2012    Comments:  05-26-12 [address], RN,BSN [phone] CM set pt up with [facility] with [facility]. Will provide telephonic monitoring. CM did send Rx for medicaitons to outpatient pharmacy and RN will pick up when complete. CM did tocuh base with MD to make aware of plans. Pt to be d/c home today no further needs for CM at this time.   Tomi Bamberger, RN,BSN [phone] Pt is approved for 3 day supply of medicaitons via the zz fund. CM will  continue to f/u for disposition needs.

## 2012-05-22 NOTE — Progress Notes (Signed)
Patient ID: [name], male   DOB: 1956-08-19, 56 y.o.   MRN: [id number]   I have asked the heart failure team to see the patient today. It will be helpful to have their full input at this point.

## 2012-05-22 NOTE — Progress Notes (Signed)
Discussed in rounds on 8/22.  Also seen that day by attending, Dr. Denny Levy.  Agree with Dr. Patsey Berthold documentation and management.

## 2012-05-22 NOTE — Progress Notes (Signed)
INITIAL ADULT NUTRITION ASSESSMENT Date: 05/22/2012   Time: 10:58 AM  INTERVENTION:  Ensure Complete daily between meals (350 kcals, 13 gm protein per 8 fl oz bottle)  RD to continue to follow  Reason for Assessment: Malnutrition Screening Tool Report  ASSESSMENT: Male 56 y.o.  Dx: dyspnea, renal insufficiency, elevated transaminases   Hx:  Past Medical History  Diagnosis Date  . Hypertension   . Psoriasis     Possible psoriasis in the past    Related Meds:  Scheduled Meds:   . albuterol  2 puff Inhalation TID  . aspirin EC  81 mg Oral Daily  . carvedilol  6.25 mg Oral BID WC  . enoxaparin (LOVENOX) injection  40 mg Subcutaneous Q24H  . furosemide  80 mg Intravenous Once  . lisinopril  20 mg Oral Daily  . pantoprazole  40 mg Oral Q1200  . simvastatin  20 mg Oral q1800  . sodium chloride  3 mL Intravenous Q12H  . DISCONTD: atenolol  100 mg Oral Daily  . DISCONTD: carvedilol  6.25 mg Oral BID WC   Continuous Infusions:  PRN Meds:.sodium chloride, acetaminophen, metoprolol, nitroGLYCERIN, ondansetron (ZOFRAN) IV, sodium chloride, technetium albumin aggregated   Ht: 5\' 11"  (180.3 cm)  Wt: 195 lb 12.8 oz (88.814 kg)  Ideal Wt: 78.1 kg % Ideal Wt: 114%  Usual Wt: --- % Usual Wt: ---  Body mass index is 27.31 kg/(m^2).  Food/Nutrition Related Hx: recent weight loss and decreased appetite per admission nutrition screen  Labs:  CMP     Component Value Date/Time   NA 142 05/22/2012 0710   K 4.2 05/22/2012 0710   CL 102 05/22/2012 0710   CO2 28 05/22/2012 0710   GLUCOSE 80 05/22/2012 0710   BUN 23 05/22/2012 0710   CREATININE 1.74* 05/22/2012 0710   CALCIUM 8.9 05/22/2012 0710   PROT 7.3 05/20/2012 2057   ALBUMIN 3.8 05/20/2012 2057   AST 44* 05/20/2012 2057   ALT 64* 05/20/2012 2057   ALKPHOS 76 05/20/2012 2057   BILITOT 1.5* 05/20/2012 2057   GFRNONAA 41* 05/22/2012 0710   GFRAA 48* 05/22/2012 0710     Intake/Output Summary (Last 24 hours) at 05/22/12 1059 Last  data filed at 05/22/12 0800  Gross per 24 hour  Intake    640 ml  Output   5175 ml  Net  -4535 ml    CBG (last 3)   Basename 05/20/12 2102  GLUCAP 105*    Diet Order: Cardiac  Supplements/Tube Feeding: N/A  IVF: N/A  Estimated Nutritional Needs:   Kcal: 2100-2300 Protein: 110-120 gm Fluid: 2.1-2.3 L  Patient admitted for progressively worsening dyspnea and chest discomfort with exertion; states prior to admission, was eating poorly; some days he would eat nothing at all; patient is a truck driver; 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  NUTRITION DIAGNOSIS: No nutrition diagnosis at this time  RELATED TO: ---  AS EVIDENCE BY: ---  MONITORING/EVALUATION(Goals): Goal: Oral intake with meals & supplements to meet >/= 90% of estimated nutrition needs Monitor: PO & supplemental intake, weight, labs, I/O's  EDUCATION NEEDS: -No education needs identified at this time  Dietitian #: [phone]  DOCUMENTATION CODES Per approved criteria  -Not Applicable    [name] 05/22/2012, 10:58 AM

## 2012-05-22 NOTE — Progress Notes (Signed)
Family Medicine Teaching Service Daily Progress Note Service Page: [phone]  Subjective:  Patient continues to report SOB; he states that it has improved a little. No other complaints this am.  Objective: Temp:  [98.1 F (36.7 C)-98.9 F (37.2 C)] 98.3 F (36.8 C) (08/23 0400) Pulse Rate:  [58-84] 84  (08/23 0400) Resp:  [17-18] 18  (08/23 0400) BP: (126-162)/(68-110) 126/68 mmHg (08/23 0400) SpO2:  [92 %-97 %] 97 % (08/23 0400) Weight:  [195 lb 12.8 oz (88.814 kg)] 195 lb 12.8 oz (88.814 kg) (08/23 0500)   Intake/Output Summary (Last 24 hours) at 05/22/12 0843 Last data filed at 05/22/12 0458  Gross per 24 hour  Intake    400 ml  Output   5175 ml  Net  -4775 ml    Exam: General: awake, alert, out of bed this am. Cardiovascular: RRR. No murmurs, rubs, or gallops auscultated. Respiratory: Bibasilar rales. Abdomen: soft, nontender, nondistended. +BS Extremities: 2+ pitting ankle edema bilaterally.   Skin: hypopigmentation of lips and fingers noted.  Psoriasis plaques noted on chest/back and extremities.  I have reviewed the patient's medications, labs, imaging, and diagnostic testing.  Notable results are summarized below.  CBC BMET   Lab 05/21/12 0200 05/20/12 2057 05/20/12 1602  WBC 6.2 6.5 6.7  HGB 15.5 16.7 16.3  HCT 45.3 48.9 47.4  PLT 206 216 206    Lab 05/22/12 0710 05/21/12 0200 05/20/12 2057  NA 142 139 142  K 4.2 4.5 5.8*  CL 102 105 105  CO2 28 22 25   BUN 23 26* 26*  CREATININE 1.74* 1.56* 1.61*  GLUCOSE 80 105* 104*  CALCIUM 8.9 9.0 9.7     Imaging/Diagnostic Tests:  Lipid Panel     Component Value Date/Time   CHOL 115 05/21/2012 0200   TRIG 75 05/21/2012 0200   HDL 30* 05/21/2012 0200   CHOLHDL 3.8 05/21/2012 0200   VLDL 15 05/21/2012 0200   LDLCALC 70 05/21/2012 0200    Lab 05/20/12 2057  TSH 2.362   ProBNP - 3473  D-Dimer - 3.15  Cardiac Panel (last 3 results)  Basename 05/21/12 0901 05/21/12 0245 05/20/12 2057  CKTOTAL 95 79 90    CKMB 1.7 1.9 2.0  TROPONINI <0.30 <0.30 <0.30  RELINDX RELATIVE INDEX IS INVALID RELATIVE INDEX IS INVALID RELATIVE INDEX IS INVALID   EKG: sinus bradycardia with 1st degree AV block, LVH, prolonged QT, anterolateral t wave inversion (V3-V6), inferior lead t wave inversion  Chest 2 View  05/20/2012   IMPRESSION:  1.  Tiny right pleural effusion. 2.  Probable vascular crowding in the lungs, rather than true edema, in the setting of low lung volumes. 3.  Question pulmonary arterial hypertension.   ECHO   - Left ventricle: The cavity size was mildly dilated. Wall thickness was normal. The estimated ejection fraction was 15%. Diffuse hypokinesis. Features are consistent with a pseudonormal left ventricular filling pattern, with concomitant abnormal relaxation and increased filling pressure (grade 2 diastolic dysfunction). - Mitral valve: Calcified annulus. - Left atrium: The atrium was mildly dilated. - Right ventricle: Systolic function was moderately reduced. - Right atrium: The atrium was mildly dilated. - Pulmonary arteries: Systolic pressure was mildly increased. PA peak pressure: 44mm Hg (S).  Dg Chest 2 View  05/20/2012  *RADIOLOGY REPORT*  Clinical Data: Shortness of breath and headache.  CHEST - 2 VIEW  Comparison: None.  Findings: Trachea is midline.  Heart is enlarged.  Pulmonary arteries appear prominent.  Lungs are low in volume  with probable vascular crowding rather than mild edema.  Tiny right pleural effusion.  Left hemidiaphragm is elevated with adjacent volume loss, likely chronic in nature.  Difficult to exclude tiny left pleural effusion.  IMPRESSION:  1.  Tiny right pleural effusion. 2.  Probable vascular crowding in the lungs, rather than true edema, in the setting of low lung volumes. 3.  Question pulmonary arterial hypertension.   Original Report Authenticated By: Reyes Ivan, M.D.    Nm Pulmonary Perfusion  05/21/2012 IMPRESSION: Very low probability for  pulmonary embolism.     US Abdomen Complete  05/21/2012 IMPRESSION:  1.  Small volume abdominal ascites. 2.  Cannot exclude mild cirrhosis.  Consider dedicated cross- sectional imaging. 3.  No biliary ductal dilatation. 4.  Gallbladder wall thickening with possible sludge or nonshadowing stone.  No specific evidence of acute cholecystitis. 5. Decreased sensitivity and specificity exam due to technique related factors, as described above.       Assessment/Plan: 56 yo M presenting with progressively worsening dyspnea and chest discomfort x 1 week.  Echo was obtained and revealed significant CHF, EF 15 %.  # CHF, unknown etiology - Cardiac enzymes negative x 3  - Echo revealed low EF of 15 %.  - Concern for PE prompted D-dimer, which was elevated at 3.15. VQ scan was then done (given elevated creatinine) and was read as  Very low probability for pulmonary embolism.    - Cardiology following and we appreciate their help.  Patient given IV Lasix 80 mg with good diuresis. Patients weight is down 6 pounds (195 this am). Will consider further diuresis per cardiology recs. - Cardiology considering Cath when renal function improves.  # HTN - Continue Lisopril.  Metoprolol changed to Coreg 6.25 BID per cardiology yesterday. - Patient is normotensive this am - 126/68.  # Elevated Transaminases  - Mildly elevated AST & ALT - Patient does report daily alcohol use - CIWA protocol initiated - Cards ordered abdominal ultrasound which revealed a small volume abdominal ascites and possible mild cirrhosis.\ - Will continue to monitor AST/ALT given Statin therapy  # Renal insufficiency - Patients creatinrose ine was elevated on admission - 1.64; We have no comparison prior to admission - Creatinine to 1.74 following IV Lasix - Will continue to monitor and be careful with diuresis  # FEN/GI- heart healthy diet  # PPx- Lovenox, Protonix  # Dispo- Pending further work up and clinical improvement. Will need  to have close PCP follow up as an outpatient.  # Code Status: Full code  Everlene Other, DO 05/22/2012, 8:30 AM

## 2012-05-22 NOTE — Progress Notes (Signed)
Seen and examined.  I am impressed by his 15% EF for systolic dysfunction CHF.  I hope this is either temporary (as in viral myocarditis) or reversable (as in ischemic cardiomyopathy with hybernating myocardium)  We have consulted cards to help with WU.  Good response to diuresis.

## 2012-05-22 NOTE — Progress Notes (Signed)
UR Completed Ambre Kobayashi Graves-Bigelow, RN,BSN [phone]  

## 2012-05-22 NOTE — Progress Notes (Signed)
Advanced Heart Failure Rounding Note PCP: [facility]  Subjective:    [name] is a 56 y.o. gentlemen with history of HTN, daily EtOH and recent diagnosis of biventricular heart failure with EF 15%.  Admitted 8/21 with progressive SOB, orthopnea. pBNP 3473.  LFTs mildly elevated at 44/64  8/22 Echo: LVEF 15%, diffuse hypokinesis.  Grade 2 diastolic dysfunction.  LA mildly dilated.  RV moderately reduced.  RA mildly dilated.  PAPP 44 mmHg. 8/22 Abd u/s: Small volume abdominal ascites.  Cannot exclude mild cirrhosis.   8/22 VQ scan: Very low probability for pulmonary embolism.  Given IV lasix 80 mg with significant urine output.  Weight down 6 pounds. UOP>5L.  He is feeling some better but not back to where he was a month ago.  He continues to have abdominal distention and lower extremity edema.  Chest pressure resolved(troponin negative).    Objective:    Vital Signs:   Temp:  [98.1 F (36.7 C)-98.9 F (37.2 C)] 98.6 F (37 C) (08/23 0800) Pulse Rate:  [58-84] 70  (08/23 0800) Resp:  [17-18] 18  (08/23 0800) BP: (126-162)/(68-110) 157/94 mmHg (08/23 0800) SpO2:  [92 %-98 %] 98 % (08/23 0915) Weight:  [88.814 kg (195 lb 12.8 oz)] 88.814 kg (195 lb 12.8 oz) (08/23 0500) Last BM Date: 05/21/12  Weight change: Filed Weights   05/20/12 2100 05/22/12 0500  Weight: 91.173 kg (201 lb) 88.814 kg (195 lb 12.8 oz)    Intake/Output:   Intake/Output Summary (Last 24 hours) at 05/22/12 1007 Last data filed at 05/22/12 0458  Gross per 24 hour  Intake    400 ml  Output   5175 ml  Net  -4775 ml     Physical Exam: General:  Chronically ill appearing. No resp difficulty HEENT: normal with muddy sclera Neck: supple. JVP jaw. Carotids 2+ bilat; no bruits. No lymphadenopathy or thryomegaly appreciated. Cor: PMI laterally. Regular rate & rhythm. +S3 no RV lift Lungs: course breath sounds throughout  Abdomen: soft, nontender, + distention.  No hepatosplenomegaly. No  bruits or masses. Good bowel sounds. Extremities: no cyanosis, clubbing, rash, 1+ bilateral lower extremity edema.  Vitiligo noted at extremities.  Neuro: alert & orientedx3, cranial nerves grossly intact. moves all 4 extremities w/o difficulty. Affect pleasant  Telemetry: NSR 70-80s  Labs: Basic Metabolic Panel:  Lab 05/22/12 1610 05/21/12 0200 05/20/12 2057 05/20/12 1602  NA 142 139 142 139  K 4.2 4.5 5.8* 4.7  CL 102 105 105 104  CO2 28 22 25 25   GLUCOSE 80 105* 104* 142*  BUN 23 26* 26* 25*  CREATININE 1.74* 1.56* 1.61* 1.64*  CALCIUM 8.9 9.0 9.7 --  MG -- -- -- --  PHOS -- -- -- --    Liver Function Tests:  Lab 05/20/12 2057 05/20/12 1602  AST 44* 48*  ALT 64* 65*  ALKPHOS 76 73  BILITOT 1.5* 1.2  PROT 7.3 7.0  ALBUMIN 3.8 3.6   No results found for this basename: LIPASE:5,AMYLASE:5 in the last 168 hours No results found for this basename: AMMONIA:3 in the last 168 hours  CBC:  Lab 05/21/12 0200 05/20/12 2057 05/20/12 1602  WBC 6.2 6.5 6.7  NEUTROABS -- -- 3.7  HGB 15.5 16.7 16.3  HCT 45.3 48.9 47.4  MCV 89.5 90.6 89.9  PLT 206 216 206    Cardiac Enzymes:  Lab 05/21/12 0901 05/21/12 0245 05/20/12 2057 05/20/12 1730  CKTOTAL 95 79 90 --  CKMB 1.7 1.9 2.0 --  CKMBINDEX -- -- -- --  TROPONINI <0.30 <0.30 <0.30 <0.30    BNP: BNP (last 3 results)  Basename 05/20/12 2057  PROBNP 3473.0*     Imaging: Dg Chest 2 View  05/20/2012  *RADIOLOGY REPORT*  Clinical Data: Shortness of breath and headache.  CHEST - 2 VIEW  Comparison: None.  Findings: Trachea is midline.  Heart is enlarged.  Pulmonary arteries appear prominent.  Lungs are low in volume with probable vascular crowding rather than mild edema.  Tiny right pleural effusion.  Left hemidiaphragm is elevated with adjacent volume loss, likely chronic in nature.  Difficult to exclude tiny left pleural effusion.  IMPRESSION:  1.  Tiny right pleural effusion. 2.  Probable vascular crowding in the lungs,  rather than true edema, in the setting of low lung volumes. 3.  Question pulmonary arterial hypertension.   Original Report Authenticated By: Reyes Ivan, M.D.    Nm Pulmonary Perfusion  05/21/2012  *RADIOLOGY REPORT*  Clinical Data: Elevated D-dimer.  Short of breath  NM PULMONARY PERFUSION PARTICULATE  Radiopharmaceutical: CURIE MAA TECHNETIUM TO 56M ALBUMIN AGGREGATED  Comparison: Chest x-ray 05/20/2012  Findings: Elevated left hemidiaphragm is noted on the chest x-ray and accounts for the defect on the perfusion scan.  No segmental or subsegmental perfusion defects are identified. Ventilation was not performed due to the normal perfusion study.  IMPRESSION: Very low probability for pulmonary embolism.   Original Report Authenticated By: Camelia Phenes, M.D.    US Abdomen Complete  05/21/2012  *RADIOLOGY REPORT*  Clinical Data:  Evaluate liver.  Elevated liver function tests.  COMPLETE ABDOMINAL ULTRASOUND  Comparison:  None.  Findings:  Gallbladder:  Partially contracted.  Wall thickening at 7 mm. Nonshadowing stone versus area of tumefactive sludge within the fundus, including on image 64. Sonographic Murphy's sign was not elicited.  No pericholecystic fluid.  Common bile duct: Normal, 6 mm.  Liver: Cannot exclude mild cirrhosis.  No focal lesion identified.  IVC: Negative  Pancreas:  Poorly visualized due to overlying bowel gas.  Spleen:  Normal in size and echogenicity.  Right Kidney:  10.8 cm. No hydronephrosis.  Left Kidney:  12.3 cm. Poorly visualized due to overlying bowel gas.  No hydronephrosis.  Abdominal aorta:  Obscured distally by bowel gas.  No aneurysm.  Small volume ascites.  IMPRESSION:  1.  Small volume abdominal ascites. 2.  Cannot exclude mild cirrhosis.  Consider dedicated cross- sectional imaging. 3.  No biliary ductal dilatation. 4.  Gallbladder wall thickening with possible sludge or nonshadowing stone.  No specific evidence of acute cholecystitis. 5. Decreased  sensitivity and specificity exam due to technique related factors, as described above.   Original Report Authenticated By: Consuello Bossier, M.D.       Medications:     Scheduled Medications:    . albuterol  2 puff Inhalation TID  . aspirin EC  81 mg Oral Daily  . carvedilol  6.25 mg Oral BID WC  . enoxaparin (LOVENOX) injection  40 mg Subcutaneous Q24H  . furosemide  80 mg Intravenous Once  . lisinopril  20 mg Oral Daily  . pantoprazole  40 mg Oral Q1200  . simvastatin  20 mg Oral q1800  . sodium chloride  3 mL Intravenous Q12H  . DISCONTD: atenolol  100 mg Oral Daily  . DISCONTD: carvedilol  6.25 mg Oral BID WC     Infusions:     PRN Medications:  sodium chloride, acetaminophen, metoprolol, nitroGLYCERIN, ondansetron (ZOFRAN) IV, sodium  chloride, technetium albumin aggregated   Assessment:   1. Acute biventricular heart failure 2. Probable NICM, EF 15%    -- will need further cardiac work up    --possible EtOH induced 3. Hypertension 4. Alcohol abuse 5. Acute renal failure 6. Mild hypertransaminase 7. Hyperkalemia, resolved  Plan/Discussion:    Mr. Whiteley has had marked urine output with IV lasix 80 mg.  Will push IV diuresis at this time and monitor renal function closely.  With hypertension patient will need afterload reduction.  If urine output slows will start nesiritide 0.005 mcg/kg/min to help with afterload, if this is needed will hold lisinopril.  With decompensation will decrease carvedilol 3.125 mg BID.    Patient will require further work up with right and left heart cath once volume status is closer to baseline and Cr is stable.  Will continue to monitor, hopefully plan for first of the week.     Length of Stay: 2  Robbi Garter, [facility] 05/22/2012, 10:07 AM  Patient seen and examined with Ulyess Blossom, PA-C. We discussed all aspects of the encounter. I agree with the assessment and plan as stated above.   We had a long talk with Mr. Berrett about  his heart failure and possible etiologies. I suspect he likely has a hypertensive cardiomyopathy with a possible contribution from ETOH. He currently has severe LV dysfunction with significant volume overload. He remain hypertensive and ab u/s shows possible mild cirrhosis. Albumin is 3.8.  At this point he will need aggressive diuresis with lasix 80 IV bid. If not diuresing briskly or Cr continue to climb would have low threshold to add milrinone or nesiritide. I am hesitant to push ACE-I at this time given CRI and need for cath on Monday. Would start hydral/NTG and add low dose spiro and dig. Would not push b-blocker just yet.  We discussed need for control of his BP and total abstinence from ETOH. 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 Plan R & L heart cath Monday.  Watch for DTs.  We will follow.   Daniel Bensimhon,MD 3:01 PM

## 2012-05-23 LAB — COMPREHENSIVE METABOLIC PANEL
Albumin: 3.5 g/dL (ref 3.5–5.2)
BUN: 19 mg/dL (ref 6–23)
Chloride: 99 mEq/L (ref 96–112)
Creatinine, Ser: 1.59 mg/dL — ABNORMAL HIGH (ref 0.50–1.35)
GFR calc Af Amer: 54 mL/min — ABNORMAL LOW (ref >90)
GFR calc non Af Amer: 46 mL/min — ABNORMAL LOW (ref >90)
Glucose, Bld: 135 mg/dL — ABNORMAL HIGH (ref 70–99)
Total Bilirubin: 1.6 mg/dL — ABNORMAL HIGH (ref 0.3–1.2)

## 2012-05-23 LAB — CBC
MCV: 89.4 fL (ref 78.0–100.0)
Platelets: 184 10*3/uL (ref 150–400)
RBC: 4.81 MIL/uL (ref 4.22–5.81)
RDW: 14.8 % (ref 11.5–15.5)
WBC: 5.9 10*3/uL (ref 4.0–10.5)

## 2012-05-23 LAB — PROTIME-INR
INR: 1.27 (ref 0.00–1.49)
Prothrombin Time: 16.2 seconds — ABNORMAL HIGH (ref 11.6–15.2)

## 2012-05-23 MED ORDER — SPIRONOLACTONE 25 MG PO TABS
25.0000 mg | ORAL_TABLET | Freq: Every day | ORAL | Status: DC
  Administered 2012-05-24 – 2012-05-26 (×3): 25 mg via ORAL
  Filled 2012-05-23 (×3): qty 1

## 2012-05-23 MED ORDER — HYDRALAZINE HCL 25 MG PO TABS
25.0000 mg | ORAL_TABLET | Freq: Three times a day (TID) | ORAL | Status: DC
  Administered 2012-05-23 – 2012-05-25 (×6): 25 mg via ORAL
  Filled 2012-05-23 (×10): qty 1

## 2012-05-23 MED ORDER — POTASSIUM CHLORIDE CRYS ER 20 MEQ PO TBCR: 40.0000 meq | EXTENDED_RELEASE_TABLET | Freq: Once | ORAL | Status: DC

## 2012-05-23 MED ORDER — POTASSIUM CHLORIDE CRYS ER 20 MEQ PO TBCR
20.0000 meq | EXTENDED_RELEASE_TABLET | Freq: Two times a day (BID) | ORAL | Status: DC
  Administered 2012-05-24 – 2012-05-26 (×5): 20 meq via ORAL
  Filled 2012-05-23 (×6): qty 1

## 2012-05-23 MED ORDER — FUROSEMIDE 10 MG/ML IJ SOLN
40.0000 mg | Freq: Two times a day (BID) | INTRAMUSCULAR | Status: DC
  Administered 2012-05-23 – 2012-05-24 (×3): 40 mg via INTRAVENOUS
  Filled 2012-05-23 (×2): qty 4

## 2012-05-23 NOTE — Progress Notes (Signed)
Subjective:  Feels well this am. Rhythm is NSR. No evidence of DTs. No chest pain or dyspnea. No labs available this am. BP still high  Objective:  Vital Signs in the last 24 hours: Temp:  [98.1 F (36.7 C)-98.5 F (36.9 C)] 98.1 F (36.7 C) (08/24 0552) Pulse Rate:  [60-82] 69  (08/24 0821) Resp:  [18] 18  (08/24 0552) BP: (132-159)/(67-98) 132/67 mmHg (08/24 0821) SpO2:  [93 %-98 %] 94 % (08/24 0730) Weight:  [196 lb 10.4 oz (89.2 kg)] 196 lb 10.4 oz (89.2 kg) (08/24 0552)  Intake/Output from previous day: 08/23 0701 - 08/24 0700 In: 240 [P.O.:240] Out: 4050 [Urine:4050] Intake/Output from this shift:       . albuterol  2 puff Inhalation TID  . aspirin EC  81 mg Oral Daily  . carvedilol  3.125 mg Oral BID WC  . digoxin  0.25 mg Oral Daily  . enoxaparin (LOVENOX) injection  40 mg Subcutaneous Q24H  . feeding supplement  237 mL Oral Q1500  . furosemide  40 mg Intravenous BID  . hydrALAZINE  25 mg Oral Q8H  . isosorbide mononitrate  30 mg Oral Daily  . lisinopril  20 mg Oral Daily  . pantoprazole  40 mg Oral Q1200  . simvastatin  20 mg Oral q1800  . sodium chloride  3 mL Intravenous Q12H  . spironolactone  12.5 mg Oral Daily  . DISCONTD: carvedilol  6.25 mg Oral BID WC  . DISCONTD: furosemide  80 mg Intravenous BID  . DISCONTD: hydrALAZINE  12.5 mg Oral Q8H      Physical Exam: The patient appears to be in no distress.  Head and neck exam reveals that the pupils are equal and reactive.  The extraocular movements are full.  There is no scleral icterus.  Mouth and pharynx are benign.  No lymphadenopathy.  No carotid bruits.  The jugular venous pressure is elevated.  Thyroid is not enlarged or tender.  Chest reveals few basilar rales bilaterally.  Heart reveals no abnormal lift or heave.  First and second heart sounds are normal.  There is no murmur gallop rub or click.  The abdomen is soft and nontender.  Bowel sounds are normoactive.  There is no  hepatosplenomegaly or mass.  There are no abdominal bruits.  Extremities reveal no phlebitis and trace edema.  Pedal pulses are good.  There is no cyanosis or clubbing.  Neurologic exam is normal strength and no lateralizing weakness.  No sensory deficits. No DTs  Integument reveals no rash  Lab Results:  Basename 05/21/12 0200 05/20/12 2057  WBC 6.2 6.5  HGB 15.5 16.7  PLT 206 216    Basename 05/22/12 0710 05/21/12 0200  NA 142 139  K 4.2 4.5  CL 102 105  CO2 28 22  GLUCOSE 80 105*  BUN 23 26*  CREATININE 1.74* 1.56*    Basename 05/21/12 0901 05/21/12 0245  TROPONINI <0.30 <0.30   Hepatic Function Panel  Basename 05/20/12 2057  PROT 7.3  ALBUMIN 3.8  AST 44*  ALT 64*  ALKPHOS 76  BILITOT 1.5*  BILIDIR --  IBILI --    Basename 05/21/12 0200  CHOL 115   No results found for this basename: PROTIME in the last 72 hours  Imaging: Imaging results have been reviewed  Cardiac Studies: Telemetry shows NSR Assessment/Plan:  Patient Active Hospital Problem List: Acute systolic CHF (congestive heart failure) (05/21/2012)   Assessment: Improved clinically although no change in weight.  Plan: Reduce IV lasix to 40 mg BID because of rising creatinine. Repeat BMET today and daily. For cath Monday. Hypertension (05/21/2012)   Assessment: BP still high   Plan: Increase hydralazine to 25 mg q8h.            Will check digoxin level in am    LOS: 3 days    Cassell Clement 05/23/2012, 8:24 AM

## 2012-05-23 NOTE — Progress Notes (Signed)
Seen and examined.  Agree with Dr. Patsey Berthold documentation and management.  Cards - heart failure team - primarily managing at this point.  Cath planned in two days (Monday).  Diuresis is a bit confusing.  He has only lost 4 lbs since admit per weights.  I would anticipate larger weight loss given O>>> I.  Regardless, we are headed in the right direction.  Cont current rx.

## 2012-05-23 NOTE — Progress Notes (Signed)
Family Medicine Teaching Service Daily Progress Note Service Page: [phone]  Subjective:  Patient reports that he is feeling well this am. SOB improving with diuresis.  Objective: Temp:  [98.1 F (36.7 C)-98.5 F (36.9 C)] 98.1 F (36.7 C) (08/24 0552) Pulse Rate:  [60-82] 69  (08/24 0821) Resp:  [18] 18  (08/24 0552) BP: (132-159)/(67-98) 132/67 mmHg (08/24 0821) SpO2:  [93 %-98 %] 94 % (08/24 0730) Weight:  [196 lb 10.4 oz (89.2 kg)] 196 lb 10.4 oz (89.2 kg) (08/24 0552)  Net output of 8185 mL  Exam: General: awake, alert, out of bed this am. Cardiovascular: RRR. No murmurs, rubs, or gallops auscultated. Respiratory: Bibasilar rales noted.  Abdomen: soft, nontender, nondistended. +BS Extremities: 2+ pitting ankle edema bilaterally.   Skin: hypopigmentation of lips and fingers noted.  Psoriasis plaques noted on chest/back and extremities.  I have reviewed the patient's medications, labs, imaging, and diagnostic testing.  Notable results are summarized below.  CBC BMET   Lab 05/23/12 0833 05/21/12 0200 05/20/12 2057  WBC 5.9 6.2 6.5  HGB 14.8 15.5 16.7  HCT 43.0 45.3 48.9  PLT 184 206 216    Lab 05/23/12 0833 05/22/12 0710 05/21/12 0200  NA 141 142 139  K 3.3* 4.2 4.5  CL 99 102 105  CO2 30 28 22   BUN 19 23 26*  CREATININE 1.59* 1.74* 1.56*  GLUCOSE 135* 80 105*  CALCIUM 9.0 8.9 9.0     Imaging/Diagnostic Tests:  Lipid Panel     Component Value Date/Time   CHOL 115 05/21/2012 0200   TRIG 75 05/21/2012 0200   HDL 30* 05/21/2012 0200   CHOLHDL 3.8 05/21/2012 0200   VLDL 15 05/21/2012 0200   LDLCALC 70 05/21/2012 0200    Lab 05/20/12 2057  TSH 2.362   ProBNP - 3473  D-Dimer - 3.15  Cardiac Panel (last 3 results)  Basename 05/21/12 0901 05/21/12 0245 05/20/12 2057  CKTOTAL 95 79 90  CKMB 1.7 1.9 2.0  TROPONINI <0.30 <0.30 <0.30  RELINDX RELATIVE INDEX IS INVALID RELATIVE INDEX IS INVALID RELATIVE INDEX IS INVALID   EKG: sinus bradycardia with 1st  degree AV block, LVH, prolonged QT, anterolateral t wave inversion (V3-V6), inferior lead t wave inversion  Chest 2 View  05/20/2012   IMPRESSION:  1.  Tiny right pleural effusion. 2.  Probable vascular crowding in the lungs, rather than true edema, in the setting of low lung volumes. 3.  Question pulmonary arterial hypertension.   ECHO   - Left ventricle: The cavity size was mildly dilated. Wall thickness was normal. The estimated ejection fraction was 15%. Diffuse hypokinesis. Features are consistent with a pseudonormal left ventricular filling pattern, with concomitant abnormal relaxation and increased filling pressure (grade 2 diastolic dysfunction). - Mitral valve: Calcified annulus. - Left atrium: The atrium was mildly dilated. - Right ventricle: Systolic function was moderately reduced. - Right atrium: The atrium was mildly dilated. - Pulmonary arteries: Systolic pressure was mildly increased. PA peak pressure: 44mm Hg (S).  Dg Chest 2 View  05/20/2012  *RADIOLOGY REPORT*  Clinical Data: Shortness of breath and headache.  CHEST - 2 VIEW  Comparison: None.  Findings: Trachea is midline.  Heart is enlarged.  Pulmonary arteries appear prominent.  Lungs are low in volume with probable vascular crowding rather than mild edema.  Tiny right pleural effusion.  Left hemidiaphragm is elevated with adjacent volume loss, likely chronic in nature.  Difficult to exclude tiny left pleural effusion.  IMPRESSION:  1.  Tiny right pleural effusion. 2.  Probable vascular crowding in the lungs, rather than true edema, in the setting of low lung volumes. 3.  Question pulmonary arterial hypertension.   Original Report Authenticated By: Reyes Ivan, M.D.    Nm Pulmonary Perfusion  05/21/2012 IMPRESSION: Very low probability for pulmonary embolism.     US Abdomen Complete  05/21/2012 IMPRESSION:  1.  Small volume abdominal ascites. 2.  Cannot exclude mild cirrhosis.  Consider dedicated cross-  sectional imaging. 3.  No biliary ductal dilatation. 4.  Gallbladder wall thickening with possible sludge or nonshadowing stone.  No specific evidence of acute cholecystitis. 5. Decreased sensitivity and specificity exam due to technique related factors, as described above.       Assessment/Plan: 56 yo M presenting with progressively worsening dyspnea and chest discomfort x 1 week.  Echo was obtained and revealed significant CHF, EF 15 %.  # CHF, unknown etiology - Echo revealed low EF of 15 %.  - Cardiac enzymes negative x 3, VQ scan for PE negative - Cardiology following and we greatly appreciate their help. - Heart failure team now following. Patient currently on ASA, Coreg, Digoxin, Spirinolactone, Lisonopril, and Lasix 40 mg IV BID. - Patient will continue diuresis and will go for Cath on monday  # HTN - Continue Lisopril, Coreg.  Hydralazine increased to 25 mg TID.  # Elevated Transaminases  - Patient does report daily alcohol use - CIWA protocol - patient has not required any treatment. - Cards ordered abdominal ultrasound which revealed a small volume abdominal ascites and possible mild cirrhosis. - AST and ALT improving - within normal limits this am (32/44) - Likely secondary to volume overload  - Will continue to monitor  # Renal insufficiency - Patients creatinine was elevated on admission - 1.64; We have no comparison prior to admission - Creatinine improving 1.59 today (down from 1.74 on 8/23) - IV Lasix decreased to 40 mg BID - Will continue to monitor with daily BMP  # FEN/GI- heart healthy diet  # PPx- Lovenox, Protonix  # Dispo- Pending further work up and clinical improvement. Will need to have close PCP follow up as an outpatient.  # Code Status: Full code  Everlene Other, DO 05/23/2012, 10:11 AM

## 2012-05-24 LAB — BASIC METABOLIC PANEL
BUN: 18 mg/dL (ref 6–23)
Creatinine, Ser: 1.47 mg/dL — ABNORMAL HIGH (ref 0.50–1.35)
GFR calc non Af Amer: 51 mL/min — ABNORMAL LOW (ref >90)
Glucose, Bld: 85 mg/dL (ref 70–99)
Potassium: 3.5 mEq/L (ref 3.5–5.1)

## 2012-05-24 MED ORDER — SODIUM CHLORIDE 0.9 % IJ SOLN: 3.0000 mL | INTRAMUSCULAR | Status: DC | PRN

## 2012-05-24 MED ORDER — FUROSEMIDE 40 MG PO TABS
40.0000 mg | ORAL_TABLET | Freq: Two times a day (BID) | ORAL | Status: DC
  Administered 2012-05-24 – 2012-05-26 (×4): 40 mg via ORAL
  Filled 2012-05-24 (×7): qty 1

## 2012-05-24 MED ORDER — SODIUM CHLORIDE 0.9 % IV SOLN: 250.0000 mL | INTRAVENOUS | Status: DC | PRN

## 2012-05-24 MED ORDER — ASPIRIN 81 MG PO CHEW
324.0000 mg | CHEWABLE_TABLET | ORAL | Status: AC
  Administered 2012-05-25: 324 mg via ORAL
  Filled 2012-05-24: qty 4

## 2012-05-24 MED ORDER — SODIUM CHLORIDE 0.9 % IJ SOLN
3.0000 mL | Freq: Two times a day (BID) | INTRAMUSCULAR | Status: DC
  Administered 2012-05-24: 3 mL via INTRAVENOUS

## 2012-05-24 MED ORDER — ALBUTEROL SULFATE HFA 108 (90 BASE) MCG/ACT IN AERS: 2.0000 | INHALATION_SPRAY | Freq: Four times a day (QID) | RESPIRATORY_TRACT | Status: DC | PRN

## 2012-05-24 NOTE — Progress Notes (Addendum)
Subjective:  Feels well this am. Rhythm is NSR. No evidence of DTs. No chest pain or dyspnea. Weights are spurious--indicate 19 lb weight loss overnight! Will ask staff to recheck weight. Renal function improving. Digoxin level low.   Objective:  Vital Signs in the last 24 hours: Temp:  [98.3 F (36.8 C)-98.8 F (37.1 C)] 98.6 F (37 C) (08/25 0500) Pulse Rate:  [62-78] 78  (08/25 0819) Resp:  [16-20] 16  (08/25 0500) BP: (129-156)/(67-92) 145/77 mmHg (08/25 0819) SpO2:  [95 %-96 %] 96 % (08/25 0500) Weight:  [177 lb 4 oz (80.4 kg)] 177 lb 4 oz (80.4 kg) (08/25 0500)  Intake/Output from previous day: 08/24 0701 - 08/25 0700 In: 963 [P.O.:960; I.V.:3] Out: 4575 [Urine:4575] Intake/Output from this shift:       . albuterol  2 puff Inhalation TID  . aspirin EC  81 mg Oral Daily  . carvedilol  3.125 mg Oral BID WC  . digoxin  0.25 mg Oral Daily  . enoxaparin (LOVENOX) injection  40 mg Subcutaneous Q24H  . feeding supplement  237 mL Oral Q1500  . furosemide  40 mg Intravenous BID  . hydrALAZINE  25 mg Oral Q8H  . isosorbide mononitrate  30 mg Oral Daily  . lisinopril  20 mg Oral Daily  . pantoprazole  40 mg Oral Q1200  . potassium chloride  20 mEq Oral BID  . potassium chloride  40 mEq Oral Once  . simvastatin  20 mg Oral q1800  . sodium chloride  3 mL Intravenous Q12H  . spironolactone  25 mg Oral Daily  . DISCONTD: hydrALAZINE  12.5 mg Oral Q8H  . DISCONTD: spironolactone  12.5 mg Oral Daily      Physical Exam: The patient appears to be in no distress.  Head and neck exam reveals that the pupils are equal and reactive.  The extraocular movements are full.  There is no scleral icterus.  Mouth and pharynx are benign.  No lymphadenopathy.  No carotid bruits.  The jugular venous pressure is normal Thyroid is not enlarged or tender.  Chest is clear this am.  Heart reveals no abnormal lift or heave.  First and second heart sounds are normal.  There is no murmur  gallop rub or click.  The abdomen is soft and nontender.  Bowel sounds are normoactive.  There is no hepatosplenomegaly or mass.  There are no abdominal bruits.  Extremities reveal no phlebitis or edema Pedal pulses are good.  There is no cyanosis or clubbing.  Neurologic exam is normal strength and no lateralizing weakness.  No sensory deficits. No DTs  Integument reveals no rash  Lab Results:  [facility] 05/23/12 0833  WBC 5.9  HGB 14.8  PLT 184    Basename 05/24/12 0505 05/23/12 0833  NA 139 141  K 3.5 3.3*  CL 97 99  CO2 31 30  GLUCOSE 85 135*  BUN 18 19  CREATININE 1.47* 1.59*    Basename 05/21/12 0901  TROPONINI <0.30   Hepatic Function Panel  Basename 05/23/12 0833  PROT 6.7  ALBUMIN 3.5  AST 32  ALT 44  ALKPHOS 69  BILITOT 1.6*  BILIDIR --  IBILI --   No results found for this basename: CHOL in the last 72 hours No results found for this basename: PROTIME in the last 72 hours  Imaging: Imaging results have been reviewed  Cardiac Studies: Telemetry shows NSR Assessment/Plan:  Patient Active Hospital Problem List: Acute systolic CHF (congestive heart failure) (  05/21/2012)   Assessment: Clinically improved. Will ask for a repeat weight. Will change lasix to po.  For cath Monday. Repeat weight 182 lb. Hypertension (05/21/2012)   Assessment: BP improving   Plan: Continue current meds.    LOS: 4 days    Cassell Clement 05/24/2012, 8:20 AM

## 2012-05-24 NOTE — Progress Notes (Signed)
Seen and examined.  Agree with Dr. Antony Haste.  For cath tomorrow.

## 2012-05-24 NOTE — Progress Notes (Signed)
Patient ID: [name], male   DOB: January 21, 1956, 56 y.o.   MRN: [id number] Family Medicine Teaching Service Daily Progress Note Service Page: [phone]  Subjective:  Patient reports that he is feeling well this am. SOB improving with diuresis. Denies chest pain  Objective: Temp:  [98.3 F (36.8 C)-98.8 F (37.1 C)] 98.6 F (37 C) (08/25 0500) Pulse Rate:  [62-78] 78  (08/25 0819) Resp:  [16-20] 16  (08/25 0500) BP: (129-156)/(77-92) 145/77 mmHg (08/25 0819) SpO2:  [95 %-96 %] 96 % (08/25 0500) Weight:  [177 lb 4 oz (80.4 kg)] 177 lb 4 oz (80.4 kg) (08/25 0500)  Net output of 8185 mL  Exam: General: awake, alert, out of bed this am. Cardiovascular: RRR. No murmurs, rubs, or gallops auscultated. Respiratory: CTAB.  Extremities: no edema.   Skin: hypopigmentation of lips and fingers noted.  Psoriasis plaques noted on chest/back and extremities.  I have reviewed the patient's medications, labs, imaging, and diagnostic testing.  Notable results are summarized below.  CBC BMET   Lab 05/23/12 0833 05/21/12 0200 05/20/12 2057  WBC 5.9 6.2 6.5  HGB 14.8 15.5 16.7  HCT 43.0 45.3 48.9  PLT 184 206 216    Lab 05/24/12 0505 05/23/12 0833 05/22/12 0710  NA 139 141 142  K 3.5 3.3* 4.2  CL 97 99 102  CO2 31 30 28   BUN 18 19 23   CREATININE 1.47* 1.59* 1.74*  GLUCOSE 85 135* 80  CALCIUM 8.9 9.0 8.9     Imaging/Diagnostic Tests:  Lipid Panel     Component Value Date/Time   CHOL 115 05/21/2012 0200   TRIG 75 05/21/2012 0200   HDL 30* 05/21/2012 0200   CHOLHDL 3.8 05/21/2012 0200   VLDL 15 05/21/2012 0200   LDLCALC 70 05/21/2012 0200    Lab 05/20/12 2057  TSH 2.362   ProBNP - 3473  D-Dimer - 3.15  Cardiac Panel (last 3 results)  Results for orders placed during the hospital encounter of 05/20/12 (from the past 24 hour(s))  BASIC METABOLIC PANEL     Status: Abnormal   Collection Time   05/24/12  5:05 AM      Component Value Range   Sodium 139  135 - 145 mEq/L   Potassium 3.5  3.5 - 5.1 mEq/L   Chloride 97  96 - 112 mEq/L   CO2 31  19 - 32 mEq/L   Glucose, Bld 85  70 - 99 mg/dL   BUN 18  6 - 23 mg/dL   Creatinine, Ser 2.95 (*) 0.50 - 1.35 mg/dL   Calcium 8.9  8.4 - 62.1 mg/dL   GFR calc non Af Amer 51 (*) >90 mL/min   GFR calc Af Amer 59 (*) >90 mL/min  DIGOXIN LEVEL     Status: Abnormal   Collection Time   05/24/12  5:05 AM      Component Value Range   Digoxin Level 0.3 (*) 0.8 - 2.0 ng/mL    EKG: sinus bradycardia with 1st degree AV block, LVH, prolonged QT, anterolateral t wave inversion (V3-V6), inferior lead t wave inversion  Chest 2 View  05/20/2012   IMPRESSION:  1.  Tiny right pleural effusion. 2.  Probable vascular crowding in the lungs, rather than true edema, in the setting of low lung volumes. 3.  Question pulmonary arterial hypertension.   ECHO   - Left ventricle: The cavity size was mildly dilated. Wall thickness was normal. The estimated ejection fraction was 15%. Diffuse hypokinesis. Features are consistent  with a pseudonormal left ventricular filling pattern, with concomitant abnormal relaxation and increased filling pressure (grade 2 diastolic dysfunction). - Mitral valve: Calcified annulus. - Left atrium: The atrium was mildly dilated. - Right ventricle: Systolic function was moderately reduced. - Right atrium: The atrium was mildly dilated. - Pulmonary arteries: Systolic pressure was mildly increased. PA peak pressure: 44mm Hg (S).  Dg Chest 2 View  05/20/2012  *RADIOLOGY REPORT*  Clinical Data: Shortness of breath and headache.  CHEST - 2 VIEW  Comparison: None.  Findings: Trachea is midline.  Heart is enlarged.  Pulmonary arteries appear prominent.  Lungs are low in volume with probable vascular crowding rather than mild edema.  Tiny right pleural effusion.  Left hemidiaphragm is elevated with adjacent volume loss, likely chronic in nature.  Difficult to exclude tiny left pleural effusion.  IMPRESSION:  1.  Tiny  right pleural effusion. 2.  Probable vascular crowding in the lungs, rather than true edema, in the setting of low lung volumes. 3.  Question pulmonary arterial hypertension.   Original Report Authenticated By: Reyes Ivan, M.D.    Nm Pulmonary Perfusion  05/21/2012 IMPRESSION: Very low probability for pulmonary embolism.     US Abdomen Complete  05/21/2012 IMPRESSION:  1.  Small volume abdominal ascites. 2.  Cannot exclude mild cirrhosis.  Consider dedicated cross- sectional imaging. 3.  No biliary ductal dilatation. 4.  Gallbladder wall thickening with possible sludge or nonshadowing stone.  No specific evidence of acute cholecystitis. 5. Decreased sensitivity and specificity exam due to technique related factors, as described above.       Assessment/Plan: 56 yo M presenting with progressively worsening dyspnea and chest discomfort x 1 week.  Echo was obtained and revealed significant CHF, EF 15 %.  # CHF, unknown etiology - Echo revealed low EF of 15 %.  - Cardiac enzymes negative x 3, VQ scan for PE negative - Cardiology following and we greatly appreciate their help. - Heart failure team now following. Patient currently on ASA, Coreg, Digoxin, Spirinolactone, Lisonopril, and Lasix 40 mg IV BID. - Patient will continue diuresis and will go for Cath on monday  # HTN - Continue Lisopril, Coreg.  Hydralazine increased to 25 mg TID.  # Elevated Transaminases  - Patient does report daily alcohol use - CIWA protocol - patient has not required any treatment. - Cards ordered abdominal ultrasound which revealed a small volume abdominal ascites and possible mild cirrhosis. - AST and ALT improving - within normal limits this am (32/44) - Likely secondary to volume overload  - Will continue to monitor  # Renal insufficiency - Patients creatinine was elevated on admission - 1.64; We have no comparison prior to admission - Creatinine improving 1.47 today (down from 1.74 on 8/23) - PO  Lasix 40 mg BID - Will continue to monitor with daily BMP  # FEN/GI- heart healthy diet  # PPx- Lovenox, Protonix  # Dispo- Pending further work up and clinical improvement. Will need to have close PCP follow up as an outpatient.  # Code Status: Full code  Marikay Alar, MD 05/24/2012, 9:01 AM

## 2012-05-25 ENCOUNTER — Encounter (HOSPITAL_COMMUNITY)
Admission: EM | Disposition: A | Discharge status: Home / Self Care | Payer: Self-pay | Source: Home / Self Care | Attending: Family Medicine

## 2012-05-25 DIAGNOSIS — I509 Heart failure, unspecified: Secondary | ICD-10-CM

## 2012-05-25 HISTORY — PX: LEFT AND RIGHT HEART CATHETERIZATION WITH CORONARY ANGIOGRAM: SHX5449

## 2012-05-25 LAB — BASIC METABOLIC PANEL
BUN: 19 mg/dL (ref 6–23)
Creatinine, Ser: 1.46 mg/dL — ABNORMAL HIGH (ref 0.50–1.35)
GFR calc non Af Amer: 51 mL/min — ABNORMAL LOW (ref >90)
Glucose, Bld: 81 mg/dL (ref 70–99)
Potassium: 3.8 mEq/L (ref 3.5–5.1)

## 2012-05-25 LAB — POCT I-STAT 3, VENOUS BLOOD GAS (G3P V)
Acid-Base Excess: 3 mmol/L — ABNORMAL HIGH (ref 0.0–2.0)
Acid-Base Excess: 5 mmol/L — ABNORMAL HIGH (ref 0.0–2.0)
Bicarbonate: 25.1 mEq/L — ABNORMAL HIGH (ref 20.0–24.0)
O2 Saturation: 80 %
O2 Saturation: 70 %
O2 Saturation: 78 %
O2 Saturation: 79 %
TCO2: 28 mmol/L (ref 0–100)
TCO2: 29 mmol/L (ref 0–100)
pCO2, Ven: 41.2 mmHg — ABNORMAL LOW (ref 45.0–50.0)
pCO2, Ven: 47.2 mmHg (ref 45.0–50.0)
pCO2, Ven: 49.3 mmHg (ref 45.0–50.0)
pH, Ven: 7.418 — ABNORMAL HIGH (ref 7.250–7.300)
pO2, Ven: 46 mmHg — ABNORMAL HIGH (ref 30.0–45.0)

## 2012-05-25 LAB — CBC
HCT: 48.3 % (ref 39.0–52.0)
MCHC: 35 g/dL (ref 30.0–36.0)
MCV: 88.6 fL (ref 78.0–100.0)
Platelets: 197 10*3/uL (ref 150–400)
RDW: 14.7 % (ref 11.5–15.5)
WBC: 4.9 10*3/uL (ref 4.0–10.5)

## 2012-05-25 LAB — CREATININE, SERUM
GFR calc Af Amer: 75 mL/min — ABNORMAL LOW (ref >90)
GFR calc non Af Amer: 64 mL/min — ABNORMAL LOW (ref >90)

## 2012-05-25 LAB — POCT I-STAT 3, ART BLOOD GAS (G3+)
Acid-Base Excess: 5 mmol/L — ABNORMAL HIGH (ref 0.0–2.0)
Bicarbonate: 29.3 mEq/L — ABNORMAL HIGH (ref 20.0–24.0)
pH, Arterial: 7.473 — ABNORMAL HIGH (ref 7.350–7.450)

## 2012-05-25 SURGERY — LEFT AND RIGHT HEART CATHETERIZATION WITH CORONARY ANGIOGRAM
Anesthesia: LOCAL

## 2012-05-25 MED ORDER — ONDANSETRON HCL 4 MG/2ML IJ SOLN: 4.0000 mg | Freq: Four times a day (QID) | INTRAMUSCULAR | Status: DC | PRN

## 2012-05-25 MED ORDER — HYDRALAZINE HCL 20 MG/ML IJ SOLN
INTRAMUSCULAR | Status: AC
  Filled 2012-05-25: qty 1

## 2012-05-25 MED ORDER — HEPARIN (PORCINE) IN NACL 2-0.9 UNIT/ML-% IJ SOLN
INTRAMUSCULAR | Status: AC
  Filled 2012-05-25: qty 2000

## 2012-05-25 MED ORDER — ENOXAPARIN SODIUM 40 MG/0.4ML ~~LOC~~ SOLN
40.0000 mg | SUBCUTANEOUS | Status: DC
  Administered 2012-05-26: 40 mg via SUBCUTANEOUS
  Filled 2012-05-25: qty 0.4

## 2012-05-25 MED ORDER — CARVEDILOL 6.25 MG PO TABS
6.2500 mg | ORAL_TABLET | Freq: Two times a day (BID) | ORAL | Status: DC
  Administered 2012-05-25 – 2012-05-26 (×2): 6.25 mg via ORAL
  Filled 2012-05-25 (×4): qty 1

## 2012-05-25 MED ORDER — NITROGLYCERIN 0.2 MG/ML ON CALL CATH LAB
INTRAVENOUS | Status: AC
  Filled 2012-05-25: qty 1

## 2012-05-25 MED ORDER — HYDRALAZINE HCL 25 MG PO TABS
37.5000 mg | ORAL_TABLET | Freq: Three times a day (TID) | ORAL | Status: DC
  Administered 2012-05-25 – 2012-05-26 (×4): 37.5 mg via ORAL
  Filled 2012-05-25 (×6): qty 1.5

## 2012-05-25 MED ORDER — ACETAMINOPHEN 325 MG PO TABS: 650.0000 mg | ORAL_TABLET | ORAL | Status: DC | PRN

## 2012-05-25 MED ORDER — SODIUM CHLORIDE 0.9 % IV SOLN: INTRAVENOUS | Status: AC

## 2012-05-25 MED ORDER — LIDOCAINE HCL (PF) 1 % IJ SOLN
INTRAMUSCULAR | Status: AC
  Filled 2012-05-25: qty 30

## 2012-05-25 NOTE — Progress Notes (Signed)
[facility] Attending Daily Note: Denny Levy MD [phone] pager office [phone] I  have seen and examined this patient, reviewed their chart. I have discussed this patient with the resident. I agree with the resident's findings, assessment and care plan. Likely d/c home tomorrow. Will ask cardiology why no immediate need for ICD?

## 2012-05-25 NOTE — Progress Notes (Signed)
Family Medicine Teaching Service Daily Progress Note Service Page: [phone]  Subjective:  Patient is feeling well post catherization. No complaints.  Objective: Temp:  [98.1 F (36.7 C)-98.5 F (36.9 C)] 98.3 F (36.8 C) (08/26 0500) Pulse Rate:  [71-78] 72  (08/26 0500) Resp:  [16-18] 18  (08/26 0500) BP: (122-181)/(77-93) 150/90 mmHg (08/26 0500) SpO2:  [93 %-96 %] 93 % (08/26 0500) Weight:  [168 lb 10.4 oz (76.5 kg)] 168 lb 10.4 oz (76.5 kg) (08/26 0500)  Weight is down 9 lbs from yesterday.   Net output - 15 L over the past 24 hours.  Exam: General: awake, alert, out of bed this am. Cardiovascular: RRR. No murmurs, rubs, or gallops auscultated. Respiratory: CTAB. Extremities: no edema.   Skin: hypopigmentation of lips and fingers noted.  Psoriasis plaques noted on chest/back and extremities.  I have reviewed the patient's medications, labs, imaging, and diagnostic testing.  Notable results are summarized below.  CBC BMET   Lab 05/23/12 0833 05/21/12 0200 05/20/12 2057  WBC 5.9 6.2 6.5  HGB 14.8 15.5 16.7  HCT 43.0 45.3 48.9  PLT 184 206 216    Lab 05/25/12 0511 05/24/12 0505 05/23/12 0833  NA 138 139 141  K 3.8 3.5 3.3*  CL 98 97 99  CO2 32 31 30  BUN 19 18 19   CREATININE 1.46* 1.47* 1.59*  GLUCOSE 81 85 135*  CALCIUM 9.4 8.9 9.0     Imaging/Diagnostic Tests:  Lipid Panel     Component Value Date/Time   CHOL 115 05/21/2012 0200   TRIG 75 05/21/2012 0200   HDL 30* 05/21/2012 0200   CHOLHDL 3.8 05/21/2012 0200   VLDL 15 05/21/2012 0200   LDLCALC 70 05/21/2012 0200    Lab 05/20/12 2057  TSH 2.362   ProBNP - 3473  D-Dimer - 3.15  Cardiac Panel (last 3 results)  Results for orders placed during the hospital encounter of 05/20/12 (from the past 24 hour(s))  BASIC METABOLIC PANEL     Status: Abnormal   Collection Time   05/25/12  5:11 AM      Component Value Range   Sodium 138  135 - 145 mEq/L   Potassium 3.8  3.5 - 5.1 mEq/L   Chloride 98  96 -  112 mEq/L   CO2 32  19 - 32 mEq/L   Glucose, Bld 81  70 - 99 mg/dL   BUN 19  6 - 23 mg/dL   Creatinine, Ser 3.08 (*) 0.50 - 1.35 mg/dL   Calcium 9.4  8.4 - 65.7 mg/dL   GFR calc non Af Amer 51 (*) >90 mL/min   GFR calc Af Amer 59 (*) >90 mL/min    EKG: sinus bradycardia with 1st degree AV block, LVH, prolonged QT, anterolateral t wave inversion (V3-V6), inferior lead t wave inversion  Chest 2 View  05/20/2012   IMPRESSION:  1.  Tiny right pleural effusion. 2.  Probable vascular crowding in the lungs, rather than true edema, in the setting of low lung volumes. 3.  Question pulmonary arterial hypertension.   ECHO   - Left ventricle: The cavity size was mildly dilated. Wall thickness was normal. The estimated ejection fraction was 15%. Diffuse hypokinesis. Features are consistent with a pseudonormal left ventricular filling pattern, with concomitant abnormal relaxation and increased filling pressure (grade 2 diastolic dysfunction). - Mitral valve: Calcified annulus. - Left atrium: The atrium was mildly dilated. - Right ventricle: Systolic function was moderately reduced. - Right atrium: The atrium was  mildly dilated. - Pulmonary arteries: Systolic pressure was mildly increased. PA peak pressure: 44mm Hg (S).  Dg Chest 2 View  05/20/2012  *RADIOLOGY REPORT*  Clinical Data: Shortness of breath and headache.  CHEST - 2 VIEW  Comparison: None.  Findings: Trachea is midline.  Heart is enlarged.  Pulmonary arteries appear prominent.  Lungs are low in volume with probable vascular crowding rather than mild edema.  Tiny right pleural effusion.  Left hemidiaphragm is elevated with adjacent volume loss, likely chronic in nature.  Difficult to exclude tiny left pleural effusion.  IMPRESSION:  1.  Tiny right pleural effusion. 2.  Probable vascular crowding in the lungs, rather than true edema, in the setting of low lung volumes. 3.  Question pulmonary arterial hypertension.   Original Report  Authenticated By: Reyes Ivan, M.D.    Nm Pulmonary Perfusion  05/21/2012 IMPRESSION: Very low probability for pulmonary embolism.     US Abdomen Complete  05/21/2012 IMPRESSION:  1.  Small volume abdominal ascites. 2.  Cannot exclude mild cirrhosis.  Consider dedicated cross- sectional imaging. 3.  No biliary ductal dilatation. 4.  Gallbladder wall thickening with possible sludge or nonshadowing stone.  No specific evidence of acute cholecystitis. 5. Decreased sensitivity and specificity exam due to technique related factors, as described above.      Heart Cath: Coronaries are normal. Hemodynamics look pretty good despite severe non-ischemic cardiomyopathy (suspect hypertensive in nature). Cardiac output is high but no evidence of intracardiac shunting.   Assessment/Plan: 56 yo M presenting with progressively worsening dyspnea and chest discomfort x 1 week.  Echo was obtained and revealed significant CHF, EF 15 %.  # CHF, unknown etiology - Echo revealed low EF of 15 %.  - Cardiac enzymes negative x 3, VQ scan for PE negative - Cardiology following and we greatly appreciate their help. - Heart failure team now following. Patient currently on ASA, Coreg, Digoxin, Spirinolactone, Lisonopril, and Lasix 40 mg IV BID. - Heart Cath revealed normal coronary arteries.  - Likely D/C tomorrow with f/u with HF clinic  # HTN - Continue Lisopril, Coreg.  Hydralazine increased to 37.5 mg TID.  # Elevated Transaminases  - Patient does report daily alcohol use - CIWA protocol - patient has not required any treatment. - Cards ordered abdominal ultrasound which revealed a small volume abdominal ascites and possible mild cirrhosis. - AST and ALT- within normal limits on 8/24.  - Likely secondary to volume overload  - Will continue to monitor.   # Renal insufficiency - Patients creatinine was elevated on admission - 1.64; We have no comparison prior to admission - Creatinine continue to improve  -  1.46 - PO Lasix 40 mg BID - Will continue to monitor with daily BMP  # FEN/GI- heart healthy diet  # PPx- Lovenox, Protonix  # Dispo- Likely D/C tomorrow. # Code Status: Full code  Everlene Other, DO 05/25/2012, 7:34 AM

## 2012-05-25 NOTE — H&P (View-Only) (Signed)
 Subjective:  Feels well this am. Rhythm is NSR. No evidence of DTs. No chest pain or dyspnea. Weights are spurious--indicate 19 lb weight loss overnight! Will ask staff to recheck weight. Renal function improving. Digoxin level low.   Objective:  Vital Signs in the last 24 hours: Temp:  [98.3 F (36.8 C)-98.8 F (37.1 C)] 98.6 F (37 C) (08/25 0500) Pulse Rate:  [62-78] 78  (08/25 0819) Resp:  [16-20] 16  (08/25 0500) BP: (129-156)/(67-92) 145/77 mmHg (08/25 0819) SpO2:  [95 %-96 %] 96 % (08/25 0500) Weight:  [177 lb 4 oz (80.4 kg)] 177 lb 4 oz (80.4 kg) (08/25 0500)  Intake/Output from previous day: 08/24 0701 - 08/25 0700 In: 963 [P.O.:960; I.V.:3] Out: 4575 [Urine:4575] Intake/Output from this shift:       . albuterol  2 puff Inhalation TID  . aspirin EC  81 mg Oral Daily  . carvedilol  3.125 mg Oral BID WC  . digoxin  0.25 mg Oral Daily  . enoxaparin (LOVENOX) injection  40 mg Subcutaneous Q24H  . feeding supplement  237 mL Oral Q1500  . furosemide  40 mg Intravenous BID  . hydrALAZINE  25 mg Oral Q8H  . isosorbide mononitrate  30 mg Oral Daily  . lisinopril  20 mg Oral Daily  . pantoprazole  40 mg Oral Q1200  . potassium chloride  20 mEq Oral BID  . potassium chloride  40 mEq Oral Once  . simvastatin  20 mg Oral q1800  . sodium chloride  3 mL Intravenous Q12H  . spironolactone  25 mg Oral Daily  . DISCONTD: hydrALAZINE  12.5 mg Oral Q8H  . DISCONTD: spironolactone  12.5 mg Oral Daily      Physical Exam: The patient appears to be in no distress.  Head and neck exam reveals that the pupils are equal and reactive.  The extraocular movements are full.  There is no scleral icterus.  Mouth and pharynx are benign.  No lymphadenopathy.  No carotid bruits.  The jugular venous pressure is normal Thyroid is not enlarged or tender.  Chest is clear this am.  Heart reveals no abnormal lift or heave.  First and second heart sounds are normal.  There is no murmur  gallop rub or click.  The abdomen is soft and nontender.  Bowel sounds are normoactive.  There is no hepatosplenomegaly or mass.  There are no abdominal bruits.  Extremities reveal no phlebitis or edema Pedal pulses are good.  There is no cyanosis or clubbing.  Neurologic exam is normal strength and no lateralizing weakness.  No sensory deficits. No DTs  Integument reveals no rash  Lab Results:  Basename 05/23/12 0833  WBC 5.9  HGB 14.8  PLT 184    Basename 05/24/12 0505 05/23/12 0833  NA 139 141  K 3.5 3.3*  CL 97 99  CO2 31 30  GLUCOSE 85 135*  BUN 18 19  CREATININE 1.47* 1.59*    Basename 05/21/12 0901  TROPONINI <0.30   Hepatic Function Panel  Basename 05/23/12 0833  PROT 6.7  ALBUMIN 3.5  AST 32  ALT 44  ALKPHOS 69  BILITOT 1.6*  BILIDIR --  IBILI --   No results found for this basename: CHOL in the last 72 hours No results found for this basename: PROTIME in the last 72 hours  Imaging: Imaging results have been reviewed  Cardiac Studies: Telemetry shows NSR Assessment/Plan:  Patient Active Hospital Problem List: Acute systolic CHF (congestive heart failure) (  05/21/2012)   Assessment: Clinically improved. Will ask for a repeat weight. Will change lasix to po.  For cath Monday. Repeat weight 182 lb. Hypertension (05/21/2012)   Assessment: BP improving   Plan: Continue current meds.    LOS: 4 days    Jasime Westergren 05/24/2012, 8:20 AM    

## 2012-05-25 NOTE — Interval H&P Note (Signed)
History and Physical Interval Note:  05/25/2012 8:02 AM  [name]  has presented today for surgery, with the diagnosis of heart failure  The various methods of treatment have been discussed with the patient and family. After consideration of risks, benefits and other options for treatment, the patient has consented to  Procedure(s) (LRB): LEFT AND RIGHT HEART CATHETERIZATION WITH CORONARY ANGIOGRAM (N/A) as a surgical intervention .  The patient's history has been reviewed, patient examined, no change in status, stable for surgery.  I have reviewed the patient's chart and labs.  Questions were answered to the patient's satisfaction.     [name]

## 2012-05-25 NOTE — CV Procedure (Signed)
Cardiac Cath Procedure Note  Indication: Heart Failure  Procedures performed:  1) Right heart cathererization 2) Selective coronary angiography 3) Left heart catheterization 4) Left ventriculogram  Description of procedure:     The risks and indication of the procedure were explained. Consent was signed and placed on the chart. An appropriate timeout was taken prior to the procedure. The right groin was prepped and draped in the routine sterile fashion and anesthetized with 1% local lidocaine.   A 5 FR arterial sheath was placed in the right femoral artery using a modified Seldinger technique. Standard catheters including a JL4, JR4 and angled pigtail were used. All catheter exchanges were made over a wire. A 7 FR venous sheath was placed in the right femoral vein using a modified Seldinger technique. A standard Swan-Ganz catheter was used for the procedure.   Complications:  None apparent  Findings:  RA = 7 RV = 56/3/10 PA = 52/23 (34) PCW = 17 v= 25 Fick cardiac output/index = 7.8/3.9 Thermo CO/CI = 6.1/3.0 PVR = 2.1 Woods SVR = 1194  FA sat = 97% PA sat = 79%, 80% High SVC = 79% PA sat =  Ao Pressure: 159/100 (126) LV Pressure:  151/9/20 There was no signficant gradient across the aortic valve on pullback.  Left main: Normal  LAD: Mild plaque proximally otherwise normal  LCX: Normal  RCA: Dominant. Normal  LV-gram done in the RAO projection: Ejection fraction = 15%. Severe global hypokinesis  Assessment: 1. Minimal non-obstructive CAD 2. Severe LV dysfunction due to NICM 3. Relatively well compensated filling pressures  4. High cardiac output with no evidence of intracardiac shunt  Plan/Discussion:  Coronaries are normal. Hemodynamics look pretty good despite severe non-ischemic cardiomyopathy (suspect hypertensive in nature). Cardiac output is high but no evidence of intracardiac shunting. Possible d/c home tomorrow with close f/u in the HF clinic. Will  titrate hydralazine for HTN.  Arvilla Meres, MD 8:40 AM

## 2012-05-26 LAB — BASIC METABOLIC PANEL
BUN: 20 mg/dL (ref 6–23)
Calcium: 9.1 mg/dL (ref 8.4–10.5)
Creatinine, Ser: 1.43 mg/dL — ABNORMAL HIGH (ref 0.50–1.35)
GFR calc Af Amer: 61 mL/min — ABNORMAL LOW (ref >90)
GFR calc non Af Amer: 53 mL/min — ABNORMAL LOW (ref >90)

## 2012-05-26 MED ORDER — SIMVASTATIN 20 MG PO TABS: 20.0000 mg | ORAL_TABLET | Freq: Every day | ORAL | Status: DC

## 2012-05-26 MED ORDER — DIGOXIN 250 MCG PO TABS: 0.2500 mg | ORAL_TABLET | Freq: Every day | ORAL | Status: DC

## 2012-05-26 MED ORDER — LISINOPRIL 20 MG PO TABS: 20.0000 mg | ORAL_TABLET | Freq: Every day | ORAL | Status: DC

## 2012-05-26 MED ORDER — ASPIRIN 81 MG PO TBEC: 81.0000 mg | DELAYED_RELEASE_TABLET | Freq: Every day | ORAL | Status: DC

## 2012-05-26 MED ORDER — FUROSEMIDE 40 MG PO TABS: 40.0000 mg | ORAL_TABLET | Freq: Two times a day (BID) | ORAL | Status: DC

## 2012-05-26 MED ORDER — HYDRALAZINE HCL 25 MG PO TABS: 37.5000 mg | ORAL_TABLET | Freq: Three times a day (TID) | ORAL | Status: DC

## 2012-05-26 MED ORDER — SPIRONOLACTONE 25 MG PO TABS: 25.0000 mg | ORAL_TABLET | Freq: Every day | ORAL | Status: DC

## 2012-05-26 MED ORDER — CARVEDILOL 6.25 MG PO TABS: 6.2500 mg | ORAL_TABLET | Freq: Two times a day (BID) | ORAL | Status: DC

## 2012-05-26 MED ORDER — PANTOPRAZOLE SODIUM 40 MG PO TBEC: 40.0000 mg | DELAYED_RELEASE_TABLET | Freq: Every day | ORAL | Status: DC

## 2012-05-26 MED ORDER — ISOSORBIDE MONONITRATE ER 30 MG PO TB24: 30.0000 mg | ORAL_TABLET | Freq: Every day | ORAL | Status: DC

## 2012-05-26 NOTE — Progress Notes (Addendum)
Family Medicine Teaching Service Daily Progress Note Service Page: [phone]  Subjective:  Patient is feeling well this am and has no complaints.  Objective: Temp:  [97.4 F (36.3 C)-98.3 F (36.8 C)] 98.2 F (36.8 C) (08/27 0500) Pulse Rate:  [73-89] 76  (08/27 0553) Resp:  [18-20] 18  (08/26 1516) BP: (106-145)/(71-91) 121/91 mmHg (08/27 0553) SpO2:  [95 %-98 %] 95 % (08/27 0500) Weight:  [185 lb (83.915 kg)] 185 lb (83.915 kg) (08/27 0500)  Net output ~15 L over the past 24 hours.  Exam: General: awake, alert, out of bed this am. Cardiovascular: RRR. No murmurs, rubs, or gallops auscultated. Respiratory: CTAB. No rales, rhonchi, or wheeze. Extremities: no edema noted. Skin: hypopigmentation of lips and fingers noted.  Psoriasis plaques noted on chest/back and extremities.  I have reviewed the patient's medications, labs, imaging, and diagnostic testing.  Notable results are summarized below.  CBC BMET   Lab 05/25/12 1145 05/23/12 0833 05/21/12 0200  WBC 4.9 5.9 6.2  HGB 16.9 14.8 15.5  HCT 48.3 43.0 45.3  PLT 197 184 206    Lab 05/26/12 0520 05/25/12 1145 05/25/12 0511 05/24/12 0505  NA 137 -- 138 139  K 4.1 -- 3.8 3.5  CL 100 -- 98 97  CO2 26 -- 32 31  BUN 20 -- 19 18  CREATININE 1.43* 1.21 1.46* --  GLUCOSE 94 -- 81 85  CALCIUM 9.1 -- 9.4 8.9     Imaging/Diagnostic Tests:  Lipid Panel     Component Value Date/Time   CHOL 115 05/21/2012 0200   TRIG 75 05/21/2012 0200   HDL 30* 05/21/2012 0200   CHOLHDL 3.8 05/21/2012 0200   VLDL 15 05/21/2012 0200   LDLCALC 70 05/21/2012 0200    Lab 05/20/12 2057  TSH 2.362   ProBNP - 3473  D-Dimer - 3.15  Cardiac Panel (last 3 results)  EKG: sinus bradycardia with 1st degree AV block, LVH, prolonged QT, anterolateral t wave inversion (V3-V6), inferior lead t wave inversion  Chest 2 View  05/20/2012   IMPRESSION:  1.  Tiny right pleural effusion. 2.  Probable vascular crowding in the lungs, rather than true edema,  in the setting of low lung volumes. 3.  Question pulmonary arterial hypertension.   ECHO   - Left ventricle: The cavity size was mildly dilated. Wall thickness was normal. The estimated ejection fraction was 15%. Diffuse hypokinesis. Features are consistent with a pseudonormal left ventricular filling pattern, with concomitant abnormal relaxation and increased filling pressure (grade 2 diastolic dysfunction). - Mitral valve: Calcified annulus. - Left atrium: The atrium was mildly dilated. - Right ventricle: Systolic function was moderately reduced. - Right atrium: The atrium was mildly dilated. - Pulmonary arteries: Systolic pressure was mildly increased. PA peak pressure: 44mm Hg (S).  Dg Chest 2 View  05/20/2012  *RADIOLOGY REPORT*  Clinical Data: Shortness of breath and headache.  CHEST - 2 VIEW  Comparison: None.  Findings: Trachea is midline.  Heart is enlarged.  Pulmonary arteries appear prominent.  Lungs are low in volume with probable vascular crowding rather than mild edema.  Tiny right pleural effusion.  Left hemidiaphragm is elevated with adjacent volume loss, likely chronic in nature.  Difficult to exclude tiny left pleural effusion.  IMPRESSION:  1.  Tiny right pleural effusion. 2.  Probable vascular crowding in the lungs, rather than true edema, in the setting of low lung volumes. 3.  Question pulmonary arterial hypertension.   Original Report Authenticated By: Reyes Ivan,  M.D.    Nm Pulmonary Perfusion  05/21/2012 IMPRESSION: Very low probability for pulmonary embolism.     US Abdomen Complete  05/21/2012 IMPRESSION:  1.  Small volume abdominal ascites. 2.  Cannot exclude mild cirrhosis.  Consider dedicated cross- sectional imaging. 3.  No biliary ductal dilatation. 4.  Gallbladder wall thickening with possible sludge or nonshadowing stone.  No specific evidence of acute cholecystitis. 5. Decreased sensitivity and specificity exam due to technique related factors, as  described above.      Heart Cath: Coronaries are normal. Hemodynamics look pretty good despite severe non-ischemic cardiomyopathy (suspect hypertensive in nature). Cardiac output is high but no evidence of intracardiac shunting.   Assessment/Plan: 56 yo M presenting with progressively worsening dyspnea and chest discomfort x 1 week.  Echo was obtained and revealed significant CHF, EF 15 %.  # CHF, NICM - Echo revealed low EF of 15 %.  - Cardiac enzymes negative x 3, VQ scan for PE negative - Cardiology following and we greatly appreciate their help. - Heart failure team now following. Patient currently on ASA, Coreg, Digoxin, Spirinolactone, Imdur, Lisonopril, and Lasix 40 mg IV /BID. - Heart Cath on 8/27 revealed: Minimal non-obstructive CAD, Severe LV dysfunction due to NICM, Relatively well compensated filling pressures, High cardiac output with no evidence of intracardiac shunt.  - Likely D/C today pending Cardiology evaluation.  Per Cardiology, ICD guidelines require 3 months of optimal medical therapy before placement of ICD.  LifeVest not recommended due to lack of high risk features.   # HTN - Continue Lisopril, Coreg, and Hydralazine. - Patients HTN improving, 121/91 this am.  # Elevated Transaminases - abdominal ultrasound revealed a small volume abdominal ascites and possible mild cirrhosis. - Resolved. - Was likely secondary to volume overload from CHF - Will need to follow up at outpatient follow up  # Renal insufficiency - Patients creatinine was elevated on admission - 1.64; We have no comparison prior to admission - Creatinine 1.43 this am. - Will continue Lasix 40 mg BID per Cardiology - Will need close follow up  # FEN/GI- heart healthy diet  # PPx- Lovenox, Protonix  # Dispo- D/C today # Code Status: Full code  Everlene Other, DO 05/26/2012, 7:23 AM

## 2012-05-26 NOTE — Progress Notes (Addendum)
Advanced Heart Failure Rounding Note PCP: [facility]  Subjective:    [name] is a 56 y.o. gentlemen with history of HTN, daily EtOH and recent diagnosis of biventricular heart failure with EF 15%.  Admitted 8/21 with progressive SOB, orthopnea. pBNP 3473.  LFTs mildly elevated at 44/64  8/22 Echo: LVEF 15%, diffuse hypokinesis.  Grade 2 diastolic dysfunction.  LA mildly dilated.  RV moderately reduced.  RA mildly dilated.  PAPP 44 mmHg. 8/22 Abd u/s: Small volume abdominal ascites.  Cannot exclude mild cirrhosis.   8/22 VQ scan: Very low probability for pulmonary embolism.  05/25/12 RHC/LHC RA = 7  RV = 56/3/10  PA = 52/23 (34)  PCW = 17 v= 25  Fick cardiac output/index = 7.8/3.9  Thermo CO/CI = 6.1/3.0  PVR = 2.1 [place]  SVR = 1194  FA sat = 97%  PA sat = 79%, 80%  High SVC = 79% EF 15% Coronaries normal.    Objective:    Vital Signs:   Temp:  [97.4 F (36.3 C)-98.3 F (36.8 C)] 98.2 F (36.8 C) (08/27 0500) Pulse Rate:  [73-89] 76  (08/27 0553) Resp:  [18-20] 18  (08/26 1516) BP: (106-145)/(71-91) 121/91 mmHg (08/27 0553) SpO2:  [95 %-98 %] 95 % (08/27 0500) Weight:  [83.915 kg (185 lb)] 83.915 kg (185 lb) (08/27 0500) Last BM Date: 05/25/12  Weight change: Filed Weights   05/24/12 0500 05/25/12 0500 05/26/12 0500  Weight: 80.4 kg (177 lb 4 oz) 76.5 kg (168 lb 10.4 oz) 83.915 kg (185 lb)    Intake/Output:   Intake/Output Summary (Last 24 hours) at 05/26/12 0937 Last data filed at 05/26/12 0600  Gross per 24 hour  Intake    480 ml  Output      0 ml  Net    480 ml     Physical Exam: WT 180.6 General:  Chronically ill appearing. No resp difficulty HEENT: normal with muddy sclera Neck: supple. JVP 6. Carotids 2+ bilat; no bruits. No lymphadenopathy or thryomegaly appreciated. Cor: PMI laterally. Regular rate & rhythm. +S3 no RV lift Lungs: course breath sounds throughout  Abdomen: soft, nontender, + distention.  No  hepatosplenomegaly. No bruits or masses. Good bowel sounds. Extremities: no cyanosis, clubbing, rash, no lower extremity edema.  Vitiligo noted at extremities.  Neuro: alert & orientedx3, cranial nerves grossly intact. moves all 4 extremities w/o difficulty. Affect pleasant  Telemetry: NSR   Labs: Basic Metabolic Panel:  Lab 05/26/12 1610 05/25/12 1145 05/25/12 0511 05/24/12 0505 05/23/12 0833 05/22/12 0710  NA 137 -- 138 139 141 142  K 4.1 -- 3.8 3.5 3.3* 4.2  CL 100 -- 98 97 99 102  CO2 26 -- 32 31 30 28   GLUCOSE 94 -- 81 85 135* 80  BUN 20 -- 19 18 19 23   CREATININE 1.43* 1.21 1.46* 1.47* 1.59* --  CALCIUM 9.1 -- 9.4 8.9 -- --  MG -- -- -- -- -- --  PHOS -- -- -- -- -- --    Liver Function Tests:  Lab 05/23/12 0833 05/20/12 2057 05/20/12 1602  AST 32 44* 48*  ALT 44 64* 65*  ALKPHOS 69 76 73  BILITOT 1.6* 1.5* 1.2  PROT 6.7 7.3 7.0  ALBUMIN 3.5 3.8 3.6   No results found for this basename: LIPASE:5,AMYLASE:5 in the last 168 hours No results found for this basename: AMMONIA:3 in the last 168 hours  CBC:  Lab 05/25/12 1145 05/23/12 0833 05/21/12 0200 05/20/12 2057  05/20/12 1602  WBC 4.9 5.9 6.2 6.5 6.7  NEUTROABS -- -- -- -- 3.7  HGB 16.9 14.8 15.5 16.7 16.3  HCT 48.3 43.0 45.3 48.9 47.4  MCV 88.6 89.4 89.5 90.6 89.9  PLT 197 184 206 216 206    Cardiac Enzymes:  Lab 05/21/12 0901 05/21/12 0245 05/20/12 2057 05/20/12 1730  CKTOTAL 95 79 90 --  CKMB 1.7 1.9 2.0 --  CKMBINDEX -- -- -- --  TROPONINI <0.30 <0.30 <0.30 <0.30    BNP: BNP (last 3 results)  Basename 05/20/12 2057  PROBNP 3473.0*     Imaging: No results found.   Medications:     Scheduled Medications:    . aspirin EC  81 mg Oral Daily  . carvedilol  6.25 mg Oral BID WC  . digoxin  0.25 mg Oral Daily  . enoxaparin  40 mg Subcutaneous Q24H  . feeding supplement  237 mL Oral Q1500  . furosemide  40 mg Oral BID  . hydrALAZINE  37.5 mg Oral Q8H  . isosorbide mononitrate  30 mg Oral  Daily  . lisinopril  20 mg Oral Daily  . pantoprazole  40 mg Oral Q1200  . potassium chloride  20 mEq Oral BID  . potassium chloride  40 mEq Oral Once  . simvastatin  20 mg Oral q1800  . sodium chloride  3 mL Intravenous Q12H  . spironolactone  25 mg Oral Daily  . DISCONTD: carvedilol  3.125 mg Oral BID WC  . DISCONTD: enoxaparin (LOVENOX) injection  40 mg Subcutaneous Q24H  . DISCONTD: hydrALAZINE  25 mg Oral Q8H  . DISCONTD: sodium chloride  3 mL Intravenous Q12H    Infusions:    . sodium chloride      PRN Medications: sodium chloride, acetaminophen, albuterol, metoprolol, nitroGLYCERIN, ondansetron (ZOFRAN) IV, sodium chloride, DISCONTD: sodium chloride, DISCONTD: acetaminophen, DISCONTD: ondansetron (ZOFRAN) IV, DISCONTD: sodium chloride   Assessment:   1. Acute biventricular heart failure 2. Probable NICM, EF 15%    -- will need further cardiac work up    --possible EtOH induced 3. Hypertension 4. Alcohol abuse 5. Acute renal failure 6. Mild hypertransaminase 7. Hyperkalemia, resolved  Plan/Discussion:    Results of cath reviewed. Relatively well tuned. Ok for d/c today on current regimen. Will see in HF clinic next week. Will be candidate for GUIDE-IT trial. We checked weight personally at d/c on standing scale 180.6 pounds  ICD guidelines require at least 3 months of optimal medical therapy following presentation for acute HF prior to implanting ICD to see if EF will recover. Could consider LifeVest but he has not had high risk features in hospital so we did not pursue. 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  We reviewed his medications prior to d/c but I am not sure he understands fully. Have asked for Home Health to follow with him and for him to bring his meds and one of his daughters to his f/u appt next week.   Gayle Martinez,MD 9:40 AM

## 2012-05-26 NOTE — Discharge Summary (Signed)
Family Medicine Teaching [facility] Discharge Summary  Patient name: [name] Medical record number: [id number] Date of birth: 10-07-1955 Age: 56 y.o. Gender: male Date of Admission: 05/20/2012  Date of Discharge: 05/25/12 Admitting Physician: Nestor Ramp, MD  Primary Care Provider: Dr. Felix Pacini, [facility]  Indication for Hospitalization: SOB, Chest discomfort Discharge Diagnoses:  Acute Systolic CHF Non-Ischemic Cardiomyopathy HTN Elevated LFT's Renal Insufficiency  Brief Hospital Course: 56 year old African American male with a PMH of HTN who presented with 1 week history of worsening dyspnea and chest discomfort with exertion.  1) Acute Systolic CHF, Non-Ischemic Cardiomyopathy Given patient's acute onset of symptoms, PMH, physical exam findings (lower extremity edema, rales) there was concern for underlying cardiopulmonary disease.  Initial work up included EKG, BNP, Cardiac Enzymes, Chest Xray, and D-Dimer.  EKG was remarkable LVH, prolonged QT, anterolateral  t wave inversion (V3-V6), and inferior lead t wave inversion.  Cardiac Enzymes were cycled and were negative.  Chest xray was notable for small pleural effusion and questionable PAH.  D-Dimer was elevated prompting further imaging with VQ scan, which was read as very low probability for PE.  BNP was significantly elevated at 3473.    Following elevated BNP, Echocardiogram was completed and revealed an EF of 15% with LV dilation and global hypokinesis.  Cardiology was then consulted.  Patient's acute CHF was treated with ASA, Simvastatin, Coreg, Digoxin, Spironolactone, Imdur, Lisinopril, and aggressive diuresis with Lasix.  Patient's symptoms greatly improved following aggressive diuresis and medical management.  Heart catherization was done during admission to evaluate for underlying CAD that could be causing CHF.  Cath revealed on mild nonobstructive CAD.  It was, therefore, determined that patient had non-ischemic  cardiomyopathy.    Patient was doing well at discharge and will follow up with Dr. Gala Romney at the heart failure clinic.  2) HTN Patient has a history of HTN and had not been taking his medication recently. Patient's HTN was treated with Coreg, Lisinopril, and Hydralazine.    3) Elevated LFT's Upon initial work up patient was found to have elevated AST and ALT (48 and 65 respectively).  Patient did report daily alcohol use.  Given elevated LFT's, cardiology ordered abdominal US which revealed a small amount of ascites and possible mild cirrhosis.   LFT's were monitored during hospitalization and returned to normal following diuresis.  Elevation was likely secondary to volume overload.  Patient was instructed to abstain from Alcohol given that it may be a cause of underlying cardiomyopathy.  4) Renal insufficiency Patient's creatinine was elevated at 1.64 on admission (no priors for comparison).  Creatinine was carefully monitored during admission and improved following diuresis.  Creatinine on discharge was 1.43.  Unsure if patient has underlying chronic disease.  Will need close follow up.   Significant Labs and Imaging:   CBC BMET   Lab 05/25/12 1145 05/23/12 0833 05/21/12 0200  WBC 4.9 5.9 6.2  HGB 16.9 14.8 15.5  HCT 48.3 43.0 45.3  PLT 197 184 206    Lab 05/26/12 0520 05/25/12 1145 05/25/12 0511 05/24/12 0505  NA 137 -- 138 139  K 4.1 -- 3.8 3.5  CL 100 -- 98 97  CO2 26 -- 32 31  BUN 20 -- 19 18  CREATININE 1.43* 1.21 1.46* --  GLUCOSE 94 -- 81 85  CALCIUM 9.1 -- 9.4 8.9     Cardiac Panel (last 3 results)   Basename  05/21/12 0901  05/21/12 0245  05/20/12 2057   CKTOTAL  95  79  90   CKMB  1.7  1.9  2.0   TROPONINI  <0.30  <0.30  <0.30   RELINDX  RELATIVE INDEX IS INVALID  RELATIVE INDEX IS INVALID  RELATIVE INDEX IS INVALID     Lipid Panel    Component  Value  Date/Time    CHOL  115  05/21/2012 0200    TRIG  75  05/21/2012 0200    HDL  30*  05/21/2012 0200     CHOLHDL  3.8  05/21/2012 0200    VLDL  15  05/21/2012 0200    LDLCALC  70  05/21/2012 0200     Lab  05/20/12 2057   TSH  2.362    ProBNP - 3473   A1C - 5.7  D-Dimer - 3.15   EKG: sinus bradycardia with 1st degree AV block, LVH, prolonged QT, anterolateral t wave inversion (V3-V6), inferior lead t wave inversion   ECHO  - Left ventricle: The cavity size was mildly dilated. Wall thickness was normal. The estimated ejection fraction was 15%. Diffuse hypokinesis. Features are consistent with a pseudonormal left ventricular filling pattern, with concomitant abnormal relaxation and increased filling pressure (grade 2 diastolic dysfunction). - Mitral valve: Calcified annulus. - Left atrium: The atrium was mildly dilated. - Right ventricle: Systolic function was moderately reduced. - Right atrium: The atrium was mildly dilated. - Pulmonary arteries: Systolic pressure was mildly increased. PA peak pressure: 44mm Hg (S).  Chest 2 View  05/20/2012 IMPRESSION: 1. Tiny right pleural effusion. 2. Probable vascular crowding in the lungs, rather than true edema, in the setting of low lung volumes. 3. Question pulmonary arterial hypertension.   Nm Pulmonary Perfusion  05/21/2012 IMPRESSION: Very low probability for pulmonary embolism.   US Abdomen Complete  05/21/2012 IMPRESSION: 1. Small volume abdominal ascites. 2. Cannot exclude mild cirrhosis. Consider dedicated cross- sectional imaging. 3. No biliary ductal dilatation. 4. Gallbladder wall thickening with possible sludge or nonshadowing stone. No specific evidence of acute cholecystitis.   Heart Cath:  1. Minimal non-obstructive CAD  2. Severe LV dysfunction due to NICM  3. Relatively well compensated filling pressures  4. High cardiac output with no evidence of intracardiac shunt   Procedures: R and L Heart Catherization  Consultations: [facility] and Heart Failure Team (Dr. Gala Romney)  Discharge Medications:  Medication List   As of 05/26/2012  6:43 PM   STOP taking these medications         atenolol 100 MG tablet         TAKE these medications         aspirin 81 MG EC tablet   Take 1 tablet (81 mg total) by mouth daily.      carvedilol 6.25 MG tablet   Commonly known as: COREG   Take 1 tablet (6.25 mg total) by mouth 2 (two) times daily with a meal.      digoxin 0.25 MG tablet   Commonly known as: LANOXIN   Take 1 tablet (0.25 mg total) by mouth daily.      furosemide 40 MG tablet   Commonly known as: LASIX   Take 1 tablet (40 mg total) by mouth 2 (two) times daily.      hydrALAZINE 25 MG tablet   Commonly known as: APRESOLINE   Take 1.5 tablets (37.5 mg total) by mouth every 8 (eight) hours.      isosorbide mononitrate 30 MG 24 hr tablet   Commonly known as: IMDUR  Take 1 tablet (30 mg total) by mouth daily.      lisinopril 20 MG tablet   Commonly known as: PRINIVIL,ZESTRIL   Take 1 tablet (20 mg total) by mouth daily.      pantoprazole 40 MG tablet   Commonly known as: PROTONIX   Take 1 tablet (40 mg total) by mouth daily at 12 noon.      simvastatin 20 MG tablet   Commonly known as: ZOCOR   Take 1 tablet (20 mg total) by mouth daily at 6 PM.      spironolactone 25 MG tablet   Commonly known as: ALDACTONE   Take 1 tablet (25 mg total) by mouth daily.           Issues for Follow Up:  1) Medication compliance and ability to afford medication (after 30 day supply is up - patient received meds from heart failure fund) should be readdressed. 2) Volume status needs to be reassessed.   3) Follow up BMP recommended to assess potassium, given patient is on Lasix. 4) Please make sure patient has attended appointment with [facility] Failure Clinic (Dr. Gala Romney).   Outstanding Results: None  Discharge Instructions: Patient was counseled important signs and symptoms that should prompt return to medical care, changes in medications, dietary instructions, activity restrictions, and follow up  appointments.    Follow-up Information    Follow up with Arvilla Meres, MD on 06/02/2012. (10:30 Garage Code [id number])    Contact information:   [address] [place] [address] [phone]       Follow up with Felix Pacini, DO on 06/03/2012. (2:00 )    Contact information:   [address] [place] [address] [phone]          Discharge Condition: Stable. Discharged Home.  Everlene Other, DO 05/26/2012, 6:43 PM

## 2012-05-26 NOTE — Progress Notes (Signed)
FMTS Attending Daily Note: Sara Neal MD [phone] pager office [phone] I  have seen and examined this patient, reviewed their chart. I have discussed this patient with the resident. I agree with the resident's findings, assessment and care plan. 

## 2012-05-27 NOTE — Discharge Summary (Signed)
Family Medicine Teaching Service  Discharge Note : Attending Ashaun Gaughan MD Pager [phone] Office [phone] I have seen and examined this patient, reviewed their chart and discussed discharge planning wit the resident at the time of discharge. I agree with the discharge plan as above.  

## 2012-06-02 ENCOUNTER — Telehealth (HOSPITAL_COMMUNITY): Payer: Self-pay | Admitting: Adult Health

## 2012-06-02 ENCOUNTER — Encounter (HOSPITAL_COMMUNITY): Payer: Self-pay

## 2012-06-02 ENCOUNTER — Ambulatory Visit (HOSPITAL_COMMUNITY)
Admit: 2012-06-02 | Discharge: 2012-06-02 | Disposition: A | Discharge status: Home / Self Care | Payer: Medicaid Other | Attending: Internal Medicine | Admitting: Internal Medicine

## 2012-06-02 VITALS — BP 130/80 | HR 76 | Ht 71.0 in | Wt 182.4 lb

## 2012-06-02 DIAGNOSIS — I5022 Chronic systolic (congestive) heart failure: Secondary | ICD-10-CM | POA: Insufficient documentation

## 2012-06-02 DIAGNOSIS — I10 Essential (primary) hypertension: Secondary | ICD-10-CM

## 2012-06-02 DIAGNOSIS — I5042 Chronic combined systolic (congestive) and diastolic (congestive) heart failure: Secondary | ICD-10-CM

## 2012-06-02 HISTORY — DX: Chronic combined systolic (congestive) and diastolic (congestive) heart failure: I50.42

## 2012-06-02 HISTORY — DX: Chronic systolic (congestive) heart failure: I50.22

## 2012-06-02 LAB — BASIC METABOLIC PANEL
CO2: 28 mEq/L (ref 19–32)
Calcium: 9.8 mg/dL (ref 8.4–10.5)
Chloride: 99 mEq/L (ref 96–112)
GFR calc Af Amer: 45 mL/min — ABNORMAL LOW (ref >90)
Sodium: 138 mEq/L (ref 135–145)

## 2012-06-02 MED ORDER — SPIRONOLACTONE 25 MG PO TABS: 12.5000 mg | ORAL_TABLET | Freq: Every day | ORAL | Status: DC

## 2012-06-02 MED ORDER — CARVEDILOL 6.25 MG PO TABS: 9.3750 mg | ORAL_TABLET | Freq: Two times a day (BID) | ORAL | Status: DC

## 2012-06-02 MED ORDER — FUROSEMIDE 40 MG PO TABS: 40.0000 mg | ORAL_TABLET | Freq: Every day | ORAL | Status: DC

## 2012-06-02 NOTE — Addendum Note (Signed)
Addended by: Noralee Space on: 06/02/2012 05:05 PM   Modules accepted: Orders

## 2012-06-02 NOTE — Assessment & Plan Note (Addendum)
Reviewed discharge summary. NYHA III. Occasional dyspnea with exertion. Volume status stable. Increase carvedilol 9.375 mg twice a day. Reinforced daily weights, limiting fluid intake to less than 2 liters per day, and low salt food choices. Provided pill box to assist with medication compliance. I have asked him to check with [facility] for medications due to expense of medications at his local pharmacy. Contacted financial counselor regarding appointment tomorrow. Plan to repeat ECHO the end of November. If EF remains <35% after optimal medical management will need referral for ICD. Check BMET today.  Follow up in 2 weeks to continue to titrate medications.

## 2012-06-02 NOTE — Progress Notes (Signed)
Patient ID: [name], male   DOB: 30-Aug-1956, 56 y.o.   MRN: [id number]  Weight Range  180  Baseline proBNP  3473    HPI: [name] is a 56 y.o. gentlemen with history of HTN, daily EtOH and recent diagnosis of biventricular heart failure with EF 15%. Admitted 8/21 with progressive SOB, orthopnea. pBNP 3473. LFTs mildly elevated at 44/64 .   8/22 Echo: LVEF 15%, diffuse hypokinesis. Grade 2 diastolic dysfunction. LA mildly dilated. RV moderately reduced. RA mildly dilated. PAPP 44 mmHg.  8/22 Abd u/s: Small volume abdominal ascites. Cannot exclude mild cirrhosis.  8/22 VQ scan: Very low probability for pulmonary embolism.   05/25/12 RHC/LHC  RA = 7  RV = 56/3/10  PA = 52/23 (34)  PCW = 17 v= 25  Fick cardiac output/index = 7.8/3.9  Thermo CO/CI = 6.1/3.0  PVR = 2.1 Woods  SVR = 1194  FA sat = 97%  PA sat = 79%, 80%  High SVC = 79%  EF 15% Coronaries normal.  He returns for post hospitalization follow up with his daughter. Feels better. Denies SOB/PND/Orthopnea/CP. Occasional dyspnea when walkinglWeight at home 183-184 pounds. Followed by [facility] for HF management. He will follow up with financial aide counselor tomorrow for [facility] application.  Compliant with medications. Remains alcohol free. Following low salt diet.      ROS: All systems negative except as listed in HPI, PMH and Problem List.  Past Medical History  Diagnosis Date  . Hypertension   . Psoriasis     Possible psoriasis in the past  . Alcohol abuse     Current Outpatient Prescriptions  Medication Sig Dispense Refill  . aspirin EC 81 MG EC tablet Take 1 tablet (81 mg total) by mouth daily.  30 tablet  0  . carvedilol (COREG) 6.25 MG tablet Take 1 tablet (6.25 mg total) by mouth 2 (two) times daily with a meal.  30 tablet  0  . digoxin (LANOXIN) 0.25 MG tablet Take 1 tablet (0.25 mg total) by mouth daily.  30 tablet  0  . furosemide (LASIX) 40 MG tablet Take 1 tablet (40 mg total) by mouth 2 (two) times  daily.  60 tablet  0  . hydrALAZINE (APRESOLINE) 25 MG tablet Take 1.5 tablets (37.5 mg total) by mouth every 8 (eight) hours.  135 tablet  0  . isosorbide mononitrate (IMDUR) 30 MG 24 hr tablet Take 1 tablet (30 mg total) by mouth daily.  30 tablet  0  . lisinopril (PRINIVIL,ZESTRIL) 20 MG tablet Take 1 tablet (20 mg total) by mouth daily.  30 tablet  0  . pantoprazole (PROTONIX) 40 MG tablet Take 1 tablet (40 mg total) by mouth daily at 12 noon.  30 tablet  0  . simvastatin (ZOCOR) 20 MG tablet Take 1 tablet (20 mg total) by mouth daily at 6 PM.  30 tablet  0  . spironolactone (ALDACTONE) 25 MG tablet Take 1 tablet (25 mg total) by mouth daily.  30 tablet  0     PHYSICAL EXAM: Filed Vitals:   06/02/12 1017  BP: 130/80  Pulse: 76  Height: 5\' 11"  (1.803 m)  Weight: 182 lb 6.4 oz (82.736 kg)  SpO2: 96%    General:  Well appearing. No resp difficulty ( Daughter present) HEENT: normal Neck: supple. JVP 5-6 Carotids 2+ bilaterally; no bruits. No lymphadenopathy or thryomegaly appreciated. Cor: PMI normal. Regular rate & rhythm. No rubs,  Murmurs. +S3 Lungs: clear Abdomen: soft, nontender, nondistended.  No hepatosplenomegaly. No bruits or masses. Good bowel sounds. Extremities: no cyanosis, clubbing, rash, edema Neuro: alert & orientedx3, cranial nerves grossly intact. Moves all 4 extremities w/o difficulty. Affect pleasant.    ASSESSMENT & PLAN:

## 2012-06-02 NOTE — Telephone Encounter (Signed)
Left message to call back  

## 2012-06-02 NOTE — Patient Instructions (Addendum)
Follow up in 2 weeks  Take Carvedilol 9.375 mg (1 1/2 tabs) twice a day   Do the following things EVERYDAY: 1) Weigh yourself in the morning before breakfast. Write it down and keep it in a log. 2) Take your medicines as prescribed 3) Eat low salt foods-Limit salt (sodium) to 2000 mg per day.  4) Stay as active as you can everyday 5) Limit all fluids for the day to less than 2 liters

## 2012-06-02 NOTE — Telephone Encounter (Signed)
Pt aware and verbalzied understanding

## 2012-06-02 NOTE — Telephone Encounter (Signed)
Will need to hold lasix for the next 2 days then cut lasix back to 40 mg daily. Reduce Spironolactone 12.5 mg daily. Will need repeat labs next week.

## 2012-06-02 NOTE — Assessment & Plan Note (Signed)
Stable; continue current regimen.

## 2012-06-03 ENCOUNTER — Encounter: Payer: Self-pay | Admitting: Family Medicine

## 2012-06-03 ENCOUNTER — Ambulatory Visit (INDEPENDENT_AMBULATORY_CARE_PROVIDER_SITE_OTHER): Payer: Self-pay | Admitting: Family Medicine

## 2012-06-03 VITALS — BP 90/54 | HR 84 | Ht 71.0 in | Wt 182.0 lb

## 2012-06-03 DIAGNOSIS — I5021 Acute systolic (congestive) heart failure: Secondary | ICD-10-CM

## 2012-06-03 DIAGNOSIS — I509 Heart failure, unspecified: Secondary | ICD-10-CM

## 2012-06-03 NOTE — Patient Instructions (Addendum)
Heart Failure Heart failure (HF) is a condition in which the heart has trouble pumping blood. This means your heart does not pump blood efficiently for your body to work well. In some cases of HF, fluid may back up into your lungs or you may have swelling (edema) in your lower legs. HF is a long-term (chronic) condition. It is important for you to take good care of yourself and follow your caregiver's treatment plan. CAUSES   Health conditions:   High blood pressure (hypertension) causes the heart muscle to work harder than normal. When pressure in the blood vessels is high, the heart needs to pump (contract) with more force in order to circulate blood throughout the body. High blood pressure eventually causes the heart to become stiff and weak.   Coronary artery disease (CAD) is the buildup of cholesterol and fat (plaques) in the arteries of the heart. The blockage in the arteries deprives the heart muscle of oxygen and blood. This can cause chest pain and may lead to a heart attack. High blood pressure can also contribute to CAD.   Heart attack (myocardial infarction) occurs when 1 or more arteries in the heart become blocked. The loss of oxygen damages the muscle tissue of the heart. When this happens, part of the heart muscle dies. The injured tissue does not contract as well and weakens the heart's ability to pump blood.   Abnormal heart valves can cause HF when the heart valves do not open and close properly. This makes the heart muscle pump harder to keep the blood flowing.   Heart muscle disease (cardiomyopathy or myocarditis) is damage to the heart muscle from a variety of causes. These can include drug or alcohol abuse, infections, or unknown reasons. These can increase the risk of HF.   Lung disease makes the heart work harder because the lungs do not work properly. This can cause a strain on the heart leading it to fail.   Diabetes increases the risk of HF. High blood sugar contributes  to high fat (lipid) levels in the blood. Diabetes can also cause slow damage to tiny blood vessels that carry important nutrients to the heart muscle. When the heart does not get enough oxygen and food, it can cause the heart to become weak and stiff. This leads to a heart that does not contract efficiently.   Other diseases can contribute to HF. These include abnormal heart rhythms, thyroid problems, and low blood counts (anemia).   Unhealthy lifestyle habits:   Obesity.   Smoking.   Eating foods high in fat and cholesterol.   Eating or drinking beverages high in salt.   Drug or alcohol abuse.   Lack of exercise.  SYMPTOMS  HF symptoms may vary and can be hard to detect. Symptoms may include:  Shortness of breath with activity, such as climbing stairs.   Persistent cough.   Swelling of the feet, ankles, legs, or abdomen.   Unexplained weight gain.   Difficulty breathing when lying flat.   Waking from sleep because of the need to sit up and get more air.   Rapid heartbeat.   Fatigue and loss of energy.   Feeling lightheaded or close to fainting.  DIAGNOSIS  A diagnosis of HF is based on your history, symptoms, physical examination, and diagnostic tests. Diagnostic tests for HF may include:  EKG.   Chest X-ray.   Blood tests.   Exercise stress test.   Blood oxygen test (arterial blood gas).   Evaluation   by a heart doctor (cardiologist).   Ultrasound evaluation of the heart (echocardiogram).   Heart artery test to look for blockages (angiogram).   Radioactive imaging to look at the heart (radionuclide test).  TREATMENT  Treatment is aimed at managing the symptoms of HF. Medicines, lifestyle changes, or surgical intervention may be necessary to treat HF.  Medicines to help treat HF may include:   Angiotensin-converting enzyme (ACE) inhibitors. These block the effects of a blood protein called angiotensin-converting enzyme. ACE inhibitors relax (dilate) the  blood vessels and help lower blood pressure. This decreases the workload of the heart, slows the progression of HF, and improves symptoms.   Angiotensin receptor blockers (ARBs). These medications work similar to ACE inhibitors. ARBs may be an alternative for people who cannot tolerate an ACE inhibitor.   Aldosterone antagonists. This medication helps get rid of extra fluid from your body. This lowers the volume of blood the heart has to pump.   Water pills (diuretics). Diuretics cause the kidneys to remove salt and water from the blood. The extra fluid is removed by urination. By removing extra fluid from the body, diuretics help lower the workload of the heart and help prevent fluid buildup in the lungs so breathing is easier.   Beta blockers. These prevent the heart from beating too fast and improve heart muscle strength. Beta blockers help maintain a normal heart rate, control blood pressure, and improve HF symptoms.   Digitalis. This increases the force of the heartbeat and may be helpful to people with HF or heart rhythm problems.   Healthy lifestyle changes include:   Stopping smoking.   Eating a healthy diet. Avoid foods high in fat. Avoid foods fried in oil or made with fat. A dietician can help with healthy food choices.   Limiting how much salt you eat.   Limiting alcohol intake to no more than 1 drink per day for women and 2 drinks per day for men. Drinking more than that is harmful to your heart. If your heart has already been damaged by alcohol or you have severe HF, drinking alcohol should be stopped completely.   Exercising as directed by your caregiver.   Surgical treatment for HF may include:   Procedures to open blocked arteries, repair damaged heart valves, or remove damaged heart muscle tissue.   A pacemaker to help heart muscle function and to control certain abnormal heart rhythms.   A defibrillator to possibly prevent sudden cardiac death.  HOME CARE  INSTRUCTIONS   Activity level. Your caregiver can help you determine what type of exercise program may be helpful. It is important to maintain your strength. Pace your physical activity to avoid shortness of breath or chest pain. Rest for 1 hour before and after meals. A cardiac rehabilitation program may be helpful to some people with HF.   Diet. Eat a heart healthy diet. Food choices should be low in saturated fat and cholesterol. Talk to a dietician to learn about heart healthy foods.   Salt intake. When you have HF, you need to limit the amount of salt you eat. Eat less than 1500 milligrams (mg) of salt per day or as recommended by your caregiver.   Weight monitoring. Weigh yourself every day. You should weigh yourself in the morning after you urinate and before you eat breakfast. Wear the same amount of clothing each time you weigh yourself. Record your weight daily. Bring your recorded weights to your clinic visits. Tell your caregiver right away if   you have gained 3 lb/1.4 kg in 1 day, or 5 lb/2.3 kg in a week or whatever amount you were told to report.   Blood pressure monitoring. This should be done as directed by your caregiver. A home blood pressure cuff can be purchased at a drugstore. Record your blood pressure numbers and bring them to your clinic visits. Tell your caregiver if you become dizzy or lightheaded upon standing up.   Smoking. If you are currently a smoker, it is time to quit. Nicotine makes your heart work harder by causing your blood vessels to constrict. Do not use nicotine gum or patches before talking to your caregiver.   Follow up. Be sure to schedule a follow-up visit with your caregiver. Keep all your appointments.  SEEK MEDICAL CARE IF:   Your weight increases by 3 lb/1.4 kg in 1 day or 5 lb/2.3 kg in a week.   You notice increasing shortness of breath that is unusual for you. This may happen during rest, sleep, or with activity.   You cough more than normal,  especially with physical activity.   You notice more swelling in your hands, feet, ankles, or belly (abdomen).   You are unable to sleep because it is hard to breathe.   You cough up bloody mucus (sputum).   You begin to feel "jumping" or "fluttering" sensations (palpitations) in your chest.  SEEK IMMEDIATE MEDICAL CARE IF:   You have severe chest pain or pressure which may include symptoms such as:   Pain or pressure in the arms, neck, jaw, or back.   Feeling sweaty.   Feeling sick to your stomach (nauseous).   Feeling short of breath while at rest.   Having a fast or irregular heartbeat.   You experience stroke symptoms. These symptoms include:   Facial weakness or numbness.   Weakness or numbness in an arm, leg, or on one side of your body.   Blurred vision.   Difficulty talking or thinking.   Dizziness or fainting.   Severe headache.  THESE ARE MEDICAL EMERGENCIES. Do not wait to see if the symptoms go away. Call your local emergency services (911 in [place] DO NOT drive yourself to the hospital. IMPORTANT  Make a list of every medicine, vitamin, or herbal supplement you are taking. Keep the list with you at all times. Show it to your caregiver at every visit. Keep the list up-to-date.   Ask your caregiver or pharmacist to write an explanation of each medicine you are taking. This should include:   Why you are taking it.   The possible side effects.   The best time of day to take it.   Foods to take with it or what foods to avoid.   When to stop taking it.  MAKE SURE YOU:   Understand these instructions.   Will watch your condition.   Will get help right away if you are not doing well or get worse.  Document Released: 09/16/2005 Document Revised: 09/05/2011 Document Reviewed: 12/29/2009 Exit[employer] Patient Information 2012 [employer]. 

## 2012-06-03 NOTE — Assessment & Plan Note (Signed)
-   New patient to the clinic, being seen after hospital stay for CHF - Discussed cardiology follow ups with [facility] and medications.  - Dr.Bensimhgn is caring for him there and taking care of all medications at this point.  - Discussed his EF of 15% and other employment opportunities. 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 He did not seem to realize this from his hospital discharge. He took the information well, and stated "he will do what he has to do." - Discussed application for medicaid- in the process discussed application for [employer] (orange card)- information and application given  - Discussed new employment possibilities and let him know we are here if he needs anything from [place].  - Will f/u in 3-4 weeks for progress on applications, discuss medications and start preventive measures.

## 2012-06-03 NOTE — Progress Notes (Signed)
Subjective:     Patient ID: [name], male   DOB: [birth year], 56 y.o.   MRN: [id number]  HPI [name] is new to the practice today, being seen after a hospital stay for acute CHF. He is doing well and feels much better than when he was discharged from the hospital. His shortness of breath have improved greatly and he is able to get around the house with out dyspnea. He however can not walk up stairs or a city block without dyspnea. He reports he feels a slight pressure in his chest at these times.  He has good support at home with his daughters helping in his care. He is concerned about being out of work. He will need an income eventually and soon. His next cardiology appointment is 9/16. He denies nausea, chest pain, shortness of breath at rest, swelling of his extremities, headache or bowel changes.   He has not had a tetanus shot in over ten years. He does not recall ever getting a flu or pneumonia vaccination. He has never had a colonoscopy.  Review of Systems See HPI above    Objective:   Physical Exam  Constitutional: He is oriented to person, place, and time. He appears well-developed and well-nourished. No distress.  HENT:  Head: Normocephalic and atraumatic.  Nose: Nose normal.  Mouth/Throat: Oropharynx is clear and moist.  Eyes: Conjunctivae and EOM are normal. Pupils are equal, round, and reactive to light. Right eye exhibits no discharge. Left eye exhibits no discharge. No scleral icterus.  Neck: Normal range of motion. Neck supple. No tracheal deviation present. No thyromegaly present.  Cardiovascular: Normal rate, regular rhythm and intact distal pulses.  Exam reveals no gallop and no friction rub.   No murmur heard. Pulmonary/Chest: Effort normal and breath sounds normal. No stridor. No respiratory distress. He has no wheezes. He has no rales. He exhibits no tenderness.  Abdominal: Soft. Bowel sounds are normal. He exhibits no distension and no mass. There is no  tenderness. There is no rebound and no guarding.  Musculoskeletal: He exhibits no edema and no tenderness.  Lymphadenopathy:    He has no cervical adenopathy.  Neurological: He is alert and oriented to person, place, and time.  Skin: Skin is warm and dry.       Vitiligo  Psychiatric: He has a normal mood and affect. His behavior is normal. Judgment and thought content normal.

## 2012-06-10 DIAGNOSIS — I509 Heart failure, unspecified: Secondary | ICD-10-CM

## 2012-06-15 ENCOUNTER — Ambulatory Visit (HOSPITAL_COMMUNITY)
Admission: RE | Admit: 2012-06-15 | Discharge: 2012-06-15 | Disposition: A | Discharge status: Home / Self Care | Payer: Medicaid Other | Source: Ambulatory Visit | Attending: Internal Medicine | Admitting: Internal Medicine

## 2012-06-15 ENCOUNTER — Encounter (HOSPITAL_COMMUNITY): Payer: Self-pay

## 2012-06-15 VITALS — BP 142/96 | HR 71 | Ht 71.0 in | Wt 189.8 lb

## 2012-06-15 DIAGNOSIS — N289 Disorder of kidney and ureter, unspecified: Secondary | ICD-10-CM | POA: Insufficient documentation

## 2012-06-15 DIAGNOSIS — I10 Essential (primary) hypertension: Secondary | ICD-10-CM | POA: Insufficient documentation

## 2012-06-15 DIAGNOSIS — I5022 Chronic systolic (congestive) heart failure: Secondary | ICD-10-CM | POA: Insufficient documentation

## 2012-06-15 DIAGNOSIS — F1010 Alcohol abuse, uncomplicated: Secondary | ICD-10-CM | POA: Insufficient documentation

## 2012-06-15 LAB — BASIC METABOLIC PANEL
CO2: 28 mEq/L (ref 19–32)
GFR calc non Af Amer: 52 mL/min — ABNORMAL LOW (ref >90)
Glucose, Bld: 109 mg/dL — ABNORMAL HIGH (ref 70–99)
Potassium: 4.5 mEq/L (ref 3.5–5.1)
Sodium: 136 mEq/L (ref 135–145)

## 2012-06-15 LAB — PRO B NATRIURETIC PEPTIDE: Pro B Natriuretic peptide (BNP): 149.4 pg/mL — ABNORMAL HIGH (ref 0–125)

## 2012-06-15 MED ORDER — HYDRALAZINE HCL 50 MG PO TABS: 50.0000 mg | ORAL_TABLET | Freq: Three times a day (TID) | ORAL | Status: DC

## 2012-06-15 NOTE — Assessment & Plan Note (Signed)
Congratulated him on alcohol cessation.

## 2012-06-15 NOTE — Addendum Note (Signed)
Encounter addended by: Sherald Hess, NP on: 06/15/2012  1:09 PM<BR>     Documentation filed: Follow-up Section, LOS Section

## 2012-06-15 NOTE — Assessment & Plan Note (Addendum)
NYHA III. He continues to complain of exertional dyspnea and fatigue. Volume status stable. SBP > 140. Increase hydralazine 50 mg tid. BMET results noted.  Reduce lasix to 40 mg daily. Will not titrate carvedilol due to fatigue. Reinforced daily weights and limiting fluid intake to < 2 liters per day.  Will need to schedule CPX due to ongoing fatigue. Will need repeat ECHO in early December. Follow up in 3 weeks.

## 2012-06-15 NOTE — Progress Notes (Signed)
Patient ID: [name], male   DOB: [birth year], 56 y.o.   MRN: [id number]   Weight Range  180  Baseline proBNP  3473    HPI: [name] is a 56 y.o. gentlemen with history of HTN, daily EtOH and recent diagnosis of biventricular heart failure with EF 15%. Admitted 8/21 with progressive SOB, orthopnea. pBNP 3473. LFTs mildly elevated at 44/64 .   8/22 Echo: LVEF 15%, diffuse hypokinesis. Grade 2 diastolic dysfunction. LA mildly dilated. RV moderately reduced. RA mildly dilated. PAPP 44 mmHg.  8/22 Abd u/s: Small volume abdominal ascites. Cannot exclude mild cirrhosis.  8/22 VQ scan: Very low probability for pulmonary embolism.   05/25/12 RHC/LHC  RA = 7  RV = 56/3/10  PA = 52/23 (34)  PCW = 17 v= 25  Fick cardiac output/index = 7.8/3.9  Thermo CO/CI = 6.1/3.0  PVR = 2.1 Woods  SVR = 1194  FA sat = 97%  PA sat = 79%, 80%  High SVC = 79%  EF 15%  Coronaries normal.  06/02/12 Potassium 4.8 Creatinine 1.8 06/15/12 Potassium 4.5 Creatinine 1.45  He returns for follow up with his daughter. On 06/02/12 he was instructed to reduce lasix to 40 mg daily and cut spironolactone to 12.5 mg daily due to elevated creatinine. He continued to take lasix 40 mg twice a day. Complains of fatigue. SOB with exertion.  +Orthopnea. Denies PND.  Dizziness in am. Weight at home 180-183 pounds.  Followed by [facility] for HF management. He will follow up with financial aide counselor today.  Compliant with medications. Remains alcohol free. Following low salt diet.      ROS: All systems negative except as listed in HPI, PMH and Problem List.  Past Medical History  Diagnosis Date  . Hypertension   . Psoriasis     Possible psoriasis in the past  . Alcohol abuse     Current Outpatient Prescriptions  Medication Sig Dispense Refill  . aspirin EC 81 MG EC tablet Take 1 tablet (81 mg total) by mouth daily.  30 tablet  0  . carvedilol (COREG) 6.25 MG tablet Take 1.5 tablets (9.375 mg total) by mouth 2 (two) times  daily with a meal.  90 tablet  3  . digoxin (LANOXIN) 0.25 MG tablet Take 1 tablet (0.25 mg total) by mouth daily.  30 tablet  0  . furosemide (LASIX) 40 MG tablet Take 1 tablet (40 mg total) by mouth daily.  60 tablet  0  . hydrALAZINE (APRESOLINE) 25 MG tablet Take 1.5 tablets (37.5 mg total) by mouth every 8 (eight) hours.  135 tablet  0  . isosorbide mononitrate (IMDUR) 30 MG 24 hr tablet Take 1 tablet (30 mg total) by mouth daily.  30 tablet  0  . lisinopril (PRINIVIL,ZESTRIL) 20 MG tablet Take 1 tablet (20 mg total) by mouth daily.  30 tablet  0  . pantoprazole (PROTONIX) 40 MG tablet Take 1 tablet (40 mg total) by mouth daily at 12 noon.  30 tablet  0  . simvastatin (ZOCOR) 20 MG tablet Take 1 tablet (20 mg total) by mouth daily at 6 PM.  30 tablet  0  . spironolactone (ALDACTONE) 25 MG tablet Take 0.5 tablets (12.5 mg total) by mouth daily.  30 tablet  0     PHYSICAL EXAM: Filed Vitals:   06/15/12 1002  BP: 142/96  Pulse: 71  Height: 5\' 11"  (1.803 m)  Weight: 189 lb 12.8 oz (86.093 kg)  SpO2: 98%  General:  Well appearing. No resp difficulty ( Daughter present) HEENT: normal Neck: supple. JVP 5-6 Carotids 2+ bilaterally; no bruits. No lymphadenopathy or thryomegaly appreciated. Cor: PMI normal. Regular rate & rhythm. No rubs,  Murmurs. +S3 Lungs: clear Abdomen: soft, nontender, nondistended. No hepatosplenomegaly. No bruits or masses. Good bowel sounds. Extremities: no cyanosis, clubbing, rash, edema Neuro: alert & orientedx3, cranial nerves grossly intact. Moves all 4 extremities w/o difficulty. Affect pleasant.    ASSESSMENT & PLAN:

## 2012-06-15 NOTE — Assessment & Plan Note (Signed)
SBP >140. Increase hydralazine to 50 mg tid.

## 2012-06-15 NOTE — Assessment & Plan Note (Signed)
Creatinine improved. Reduce lasix to 40 mg daily.

## 2012-06-15 NOTE — Patient Instructions (Addendum)
Take Hydralazine 50 mg tid  Follow up in 2 weeks  Do the following things EVERYDAY: 1) Weigh yourself in the morning before breakfast. Write it down and keep it in a log. 2) Take your medicines as prescribed 3) Eat low salt foods-Limit salt (sodium) to 2000 mg per day.  4) Stay as active as you can everyday 5) Limit all fluids for the day to less than 2 liters

## 2012-06-19 ENCOUNTER — Telehealth (HOSPITAL_COMMUNITY): Payer: Self-pay | Admitting: Cardiology

## 2012-06-19 ENCOUNTER — Other Ambulatory Visit (HOSPITAL_COMMUNITY): Payer: Self-pay | Admitting: *Deleted

## 2012-06-19 ENCOUNTER — Telehealth (HOSPITAL_COMMUNITY): Payer: Self-pay | Admitting: Adult Health

## 2012-06-19 MED ORDER — SIMVASTATIN 20 MG PO TABS: 20.0000 mg | ORAL_TABLET | Freq: Every day | ORAL | Status: DC

## 2012-06-19 MED ORDER — PANTOPRAZOLE SODIUM 40 MG PO TBEC: 40.0000 mg | DELAYED_RELEASE_TABLET | Freq: Every day | ORAL | Status: DC

## 2012-06-19 MED ORDER — FUROSEMIDE 40 MG PO TABS: 40.0000 mg | ORAL_TABLET | Freq: Two times a day (BID) | ORAL | Status: DC

## 2012-06-19 MED ORDER — ASPIRIN 81 MG PO TBEC: 81.0000 mg | DELAYED_RELEASE_TABLET | Freq: Every day | ORAL | Status: DC

## 2012-06-19 NOTE — Telephone Encounter (Signed)
Reviewed [facility] tele-monitoring. Weight trending up to 188 pounds.   He says that he is eating constantly. Reinforced low salt food choices.   Instructed to take Lasix 40 mg BID. Will ask [facility] to repeat BMET 06/30/12.   [name] verbalized understanding.

## 2012-06-19 NOTE — Telephone Encounter (Signed)
PT STATES HE WAS UNABLE TO GET THE PROTONIX AND ZOCOR AT THE [facility]. REQUESTED TO HAVE MEDS SENT, ALONG WITH [facility] TO THE [facility] ON [address].

## 2012-06-23 NOTE — Telephone Encounter (Signed)
Pt called back to request that a cheaper med be called into [facility] as the pantropazole and simvastatin are no longer available on the $4 lisr

## 2012-06-30 ENCOUNTER — Encounter (HOSPITAL_COMMUNITY): Payer: Self-pay

## 2012-06-30 ENCOUNTER — Ambulatory Visit (HOSPITAL_COMMUNITY)
Admission: RE | Admit: 2012-06-30 | Discharge: 2012-06-30 | Disposition: A | Discharge status: Home / Self Care | Payer: Medicaid Other | Source: Ambulatory Visit | Attending: Internal Medicine | Admitting: Internal Medicine

## 2012-06-30 VITALS — BP 110/70 | HR 68 | Ht 71.0 in | Wt 193.4 lb

## 2012-06-30 DIAGNOSIS — I509 Heart failure, unspecified: Secondary | ICD-10-CM | POA: Insufficient documentation

## 2012-06-30 DIAGNOSIS — I5022 Chronic systolic (congestive) heart failure: Secondary | ICD-10-CM | POA: Insufficient documentation

## 2012-06-30 MED ORDER — PRAVASTATIN SODIUM 20 MG PO TABS: 20.0000 mg | ORAL_TABLET | Freq: Every day | ORAL | Status: DC

## 2012-06-30 MED ORDER — OMEPRAZOLE 40 MG PO CPDR: 40.0000 mg | DELAYED_RELEASE_CAPSULE | Freq: Every day | ORAL | Status: DC

## 2012-06-30 NOTE — Assessment & Plan Note (Addendum)
NYHA III. Continues to complain of profound fatigue. Volume status stable. Continue current diuretic regimen. Will not titrate beta blocker due to fatigue. Check CPX to determine if circulatory system contributing to fatigue. Follow up in 3 weeks with Dr Gala Romney.

## 2012-06-30 NOTE — Progress Notes (Signed)
Patient ID: [name], male   DOB: 04/23/1956, 56 y.o.   MRN: [id number]  Weight Range  180  Baseline proBNP  3473    HPI: [name] is a 56 y.o. gentlemen with history of HTN, daily EtOH and recent diagnosis of biventricular heart failure with EF 15%. Admitted 8/21 with progressive SOB, orthopnea. pBNP 3473. LFTs mildly elevated at 44/64 .   8/22 Echo: LVEF 15%, diffuse hypokinesis. Grade 2 diastolic dysfunction. LA mildly dilated. RV moderately reduced. RA mildly dilated. PAPP 44 mmHg.  8/22 Abd u/s: Small volume abdominal ascites. Cannot exclude mild cirrhosis.  8/22 VQ scan: Very low probability for pulmonary embolism.   05/25/12 RHC/LHC  RA = 7  RV = 56/3/10  PA = 52/23 (34)  PCW = 17 v= 25  Fick cardiac output/index = 7.8/3.9  Thermo CO/CI = 6.1/3.0  PVR = 2.1 Woods  SVR = 1194  FA sat = 97%  PA sat = 79%, 80%  High SVC = 79%  EF 15%  Coronaries normal.  06/02/12 Potassium 4.8 Creatinine 1.8 06/15/12 Potassium 4.5 Creatinine 1.45  He returns for follow up. Last visit hydralazine increased  50 mg tid and  lasix cut back to  40 mg daily but weight went back up and he went back to lasix 40 mg twice a day.  Continues to complain of fatigue. Intermittent dizziness in am. Dyspnea going up hills but he feels better. + Orthopnea.  Weight at home 186-188 pounds. Complaint with medications. He has one beer. SBP at home 109-181. Followed by [facility].   ROS: All systems negative except as listed in HPI, PMH and Problem List.  Past Medical History  Diagnosis Date  . Hypertension   . Psoriasis     Possible psoriasis in the past  . Alcohol abuse     Current Outpatient Prescriptions  Medication Sig Dispense Refill  . aspirin 81 MG EC tablet Take 1 tablet (81 mg total) by mouth daily.  30 tablet  6  . carvedilol (COREG) 6.25 MG tablet Take 1.5 tablets (9.375 mg total) by mouth 2 (two) times daily with a meal.  90 tablet  3  . digoxin (LANOXIN) 0.25 MG tablet Take 1 tablet (0.25 mg  total) by mouth daily.  30 tablet  0  . furosemide (LASIX) 40 MG tablet Take 1 tablet (40 mg total) by mouth 2 (two) times daily.  60 tablet  2  . hydrALAZINE (APRESOLINE) 50 MG tablet Take 1 tablet (50 mg total) by mouth 3 (three) times daily.  135 tablet  0  . isosorbide mononitrate (IMDUR) 30 MG 24 hr tablet Take 1 tablet (30 mg total) by mouth daily.  30 tablet  0  . lisinopril (PRINIVIL,ZESTRIL) 20 MG tablet Take 1 tablet (20 mg total) by mouth daily.  30 tablet  0  . pantoprazole (PROTONIX) 40 MG tablet Take 1 tablet (40 mg total) by mouth daily at 12 noon.  30 tablet  6  . simvastatin (ZOCOR) 20 MG tablet Take 1 tablet (20 mg total) by mouth daily at 6 PM.  30 tablet  6  . spironolactone (ALDACTONE) 25 MG tablet Take 0.5 tablets (12.5 mg total) by mouth daily.  30 tablet  0     PHYSICAL EXAM: Filed Vitals:   06/30/12 0852  BP: 110/70  Pulse: 68  Height: 5\' 11"  (1.803 m)  Weight: 193 lb 6.4 oz (87.726 kg)  SpO2: 96%    General:  Chronically ill appearing. No resp difficulty  HEENT: normal Neck: supple. JVP flat. Carotids 2+ bilaterally; no bruits. No lymphadenopathy or thryomegaly appreciated. Cor: PMI normal. Regular rate & rhythm. No rubs,  Murmurs. +S3 Lungs: clear Abdomen: soft, nontender, nondistended. No hepatosplenomegaly. No bruits or masses. Good bowel sounds. Extremities: no cyanosis, clubbing, rash, edema Neuro: alert & orientedx3, cranial nerves grossly intact. Moves all 4 extremities w/o difficulty. Affect pleasant.    ASSESSMENT & PLAN:

## 2012-06-30 NOTE — Patient Instructions (Signed)
Your physician has recommended that you have a cardiopulmonary stress test (CPX). CPX testing is a non-invasive measurement of heart and lung function. It replaces a traditional treadmill stress test. This type of test provides a tremendous amount of information that relates not only to your present condition but also for future outcomes. This test combines measurements of you ventilation, respiratory gas exchange in the lungs, electrocardiogram (EKG), blood pressure and physical response before, during, and following an exercise protocol.  Your physician recommends that you schedule a follow-up appointment in: 3 weeks

## 2012-07-02 ENCOUNTER — Ambulatory Visit (INDEPENDENT_AMBULATORY_CARE_PROVIDER_SITE_OTHER): Payer: Self-pay | Admitting: Family Medicine

## 2012-07-02 ENCOUNTER — Encounter: Payer: Self-pay | Admitting: Family Medicine

## 2012-07-02 VITALS — BP 120/70 | HR 63 | Temp 98.1°F | Ht 71.75 in | Wt 191.0 lb

## 2012-07-02 DIAGNOSIS — I509 Heart failure, unspecified: Secondary | ICD-10-CM

## 2012-07-02 DIAGNOSIS — I5021 Acute systolic (congestive) heart failure: Secondary | ICD-10-CM

## 2012-07-02 NOTE — Assessment & Plan Note (Addendum)
-   patient being followed by cardiology closely - Last echo: 15% EF, repeat Echo in December.  - Doing well on medications, BP stable - Cardiac Stress scheduled Oct. 15 - F/U: in 6 months unless needed

## 2012-07-02 NOTE — Patient Instructions (Addendum)
Cardiac Diet A cardiac diet can help stop heart disease or a stroke from happening. It involves eating less unhealthy fats and eating more healthy fats.  FOODS TO AVOID OR LIMIT  Limit saturated fats. This type of fat is found in oils and dairy products, such as:  Coconut oil.  Palm oil.  Cocoa butter.  Butter.  Avoid trans-fat or hydrogenated oils. These are found in fried or pre-made baked goods, such as:  Margarine.  Pre-made cookies, cakes, and crackers.  Limit processed meats (hot dogs, deli meats, sausage) to 3 ounces a week.  Limit high-fat meats (marbled meats, fried chicken, or chicken with skin) to 3 ounces a week.  Limit salt (sodium) to 1500 milligrams a day.   Limit sweets and drinks with added sugar to no more than 5 servings a week. One serving is:  1 tablespoon of sugar.  1 tablespoon of jelly or jam.   cup sorbet.  1 cup lemonade.   cup regular soda. EAT MORE OF THE FOLLOWING FOODS Fruit  Eat 4to 5 servings a day. One serving of fruit is:  1 medium whole fruit.   cup dried fruit.   cup of fresh, frozen, or canned fruit.   cup 100% fruit juice. Vegetables  Eat 4 to 5 servings a day. One serving is:  1 cup raw leafy vegetables.   cup raw or cooked, cut-up vegetables.   cup vegetable juice. Whole Grains  Eat 3 servings a day (1 ounce equals 1 serving). Legumes (such as beans, peas, and lentils)   Eat at least 4 servings a week ( cup equals 1 serving). Nuts and Seeds   Eat at least 4 servings a week ( cup equals 1 serving). Dietary Fiber  Eat 20 to 30 grams a day. Some foods high in dietary fiber include:  Dried beans.  Citrus fruits.  Apples, bananas.  Broccoli, Brussels sprouts, and eggplant.  Oats. Omega-3 Fats  Eat food with omega-3 fats. You can also take a dietary pill (supplement) that has 1 gram of DHA and EPA. Have 3.5 ounces of fatty fish a week, such as:  Salmon.  Mackerel.  [place]  tuna.  Sardines.  Lake trout.  Herring. PREPARING YOUR FOOD  Broil, bake, steam, or roast foods. Do not fry food. Do not cook food in butter (fat).  Use non-stick cooking sprays.  Remove skin from poultry, such as chicken and [place].  Remove fat from meat.  Take the fat off the top of stews, soups, and gravy.  Use lemon or herbs to flavor food instead of using butter or margarine.  Use nonfat yogurt, salsa, or low-fat dressings for salads. Document Released: 03/17/2012 Document Reviewed: 12/10/2011 [facility] Patient Information 2013 [place], [place].   - colonoscopy - Flu vaccine - Pneumococcal vaccine -Tetanus (Tdap)

## 2012-07-02 NOTE — Progress Notes (Signed)
Patient ID: [name], male   DOB: [birth year], 56 y.o.   MRN: [id number]   Subjective:  CHF: [name] is a new patient to the clinic. This is the second time I have met him, first time being the follow-up to his CHF hospital stay. Today we are discussing follow up to his cardiology appointment and the process of getting his medicaid card and [place] card (orange card). He is scheduled for a stress test on one week on the 15th. He is still feeling the same and getting winded on activity. He reports he is not getting any worse,but not feeling much better than the last time I saw him either.  He has had a few medications changes, which have been noted in his chart, and scheduled for a stress test. He will have a repeat echo in December.   Social: He has started the paperwork for both medicaid and orange card and will call back in when they come through to start the preventive measures he is interested in but declines due to financial circumstances currently.    Objective: BP 120/70  Pulse 63  Temp 98.1 F (36.7 C) (Oral)  Ht 5' 11.75" (1.822 m)  Wt 86.637 kg (191 lb)  BMI 26.09 kg/m2 Gen: Very nice gentleman. Happy and dealing with his medical issues and his problems due to being out of work do to medical issues.  Heart: RRR. 2/6 SM. No gallops or rubs. Lungs: CTAB.  Abd: Soft. NT.ND. No HSM. Ext: No edema.

## 2012-07-03 ENCOUNTER — Telehealth (HOSPITAL_COMMUNITY): Payer: Self-pay | Admitting: Cardiology

## 2012-07-03 NOTE — Telephone Encounter (Signed)
Per Tonye Becket, NP continue service, Raynelle Fanning is aware and will go out weekly for several more weeks

## 2012-07-03 NOTE — Telephone Encounter (Signed)
Is it ok to continue seeing pt or can they d/c patient? Original orders for 2-3 weeks and pt is doing very well. Independent   Ok to give verbal and will continue services  Or D/C

## 2012-07-07 ENCOUNTER — Telehealth (HOSPITAL_COMMUNITY): Payer: Self-pay | Admitting: *Deleted

## 2012-07-07 MED ORDER — PRAVASTATIN SODIUM 40 MG PO TABS: 40.0000 mg | ORAL_TABLET | Freq: Every day | ORAL | Status: DC

## 2012-07-07 NOTE — Telephone Encounter (Signed)
Pt states he went to [facility] to pick up the simvastatin and omeprazole but the meds were $60 so he could not afford them, have called in pravastatin 40 mg which is on the $4 list in place of simva and have advised him to look over the counter for omeprazole or prevacid he is agreeable and verbalizes understanding

## 2012-07-09 ENCOUNTER — Encounter: Payer: Self-pay | Admitting: Internal Medicine

## 2012-07-14 ENCOUNTER — Ambulatory Visit (HOSPITAL_COMMUNITY): Payer: Medicaid Other | Attending: Internal Medicine

## 2012-07-14 DIAGNOSIS — I5022 Chronic systolic (congestive) heart failure: Secondary | ICD-10-CM

## 2012-07-24 ENCOUNTER — Ambulatory Visit (HOSPITAL_COMMUNITY)
Admission: RE | Admit: 2012-07-24 | Discharge: 2012-07-24 | Disposition: A | Discharge status: Home / Self Care | Payer: Medicaid Other | Source: Ambulatory Visit | Attending: Internal Medicine | Admitting: Internal Medicine

## 2012-07-24 ENCOUNTER — Encounter (HOSPITAL_COMMUNITY): Payer: Self-pay

## 2012-07-24 VITALS — BP 126/88 | HR 76 | Wt 197.8 lb

## 2012-07-24 DIAGNOSIS — I5022 Chronic systolic (congestive) heart failure: Secondary | ICD-10-CM | POA: Insufficient documentation

## 2012-07-24 MED ORDER — CARVEDILOL 12.5 MG PO TABS: 12.5000 mg | ORAL_TABLET | Freq: Two times a day (BID) | ORAL | Status: DC

## 2012-07-24 MED ORDER — CARVEDILOL 6.25 MG PO TABS: 12.5000 mg | ORAL_TABLET | Freq: Two times a day (BID) | ORAL | Status: DC

## 2012-07-24 NOTE — Assessment & Plan Note (Addendum)
Volume status mildly elevated.  Will give extra dose of lasix for weight 191 pounds or greater.  Increase carvedilol 2 tabs (12.5 mg) twice daily.  Will repeat echo.  Have discussed results of CPX in full, all questions answered.    Patient seen and examined with Ulyess Blossom, PA-C. We discussed all aspects of the encounter. I agree with the assessment and plan as stated above.  He is mildly volume overloaded. Will give extra lasix. Reinforced need for daily weights and reviewed use of sliding scale diuretics. Increase carvedilol. CPX test looks good. No need for advanced therapies at this point.

## 2012-07-24 NOTE — Patient Instructions (Addendum)
Increase carvedilol 2 tabs twice daily until you pick up refill then take 1 twice daily.  Ideal weight 187-190.  If weight is 191 or more then increase lasix to 2 tabs in the morning and 1 in the afternoon.    Your physician has requested that you have an echocardiogram. Echocardiography is a painless test that uses sound waves to create images of your heart. It provides your doctor with information about the size and shape of your heart and how well your heart's chambers and valves are working. This procedure takes approximately one hour. There are no restrictions for this procedure.  Do the following things EVERYDAY: 1) Weigh yourself in the morning before breakfast. Write it down and keep it in a log. 2) Take your medicines as prescribed 3) Eat low salt foods-Limit salt (sodium) to 2000 mg per day.  4) Stay as active as you can everyday 5) Limit all fluids for the day to less than 2 liters

## 2012-07-24 NOTE — Progress Notes (Signed)
Weight Range  180  Baseline proBNP  3473    HPI: [name] is a 56 y.o. gentlemen with history of HTN, daily EtOH and recent diagnosis of biventricular heart failure with EF 15%. Admitted 8/21 with progressive SOB, orthopnea. pBNP 3473. LFTs mildly elevated at 44/64 .   8/22 Echo: LVEF 15%, diffuse hypokinesis. Grade 2 diastolic dysfunction. LA mildly dilated. RV moderately reduced. RA mildly dilated. PAPP 44 mmHg.  8/22 Abd u/s: Small volume abdominal ascites. Cannot exclude mild cirrhosis.  8/22 VQ scan: Very low probability for pulmonary embolism.   05/25/12 RHC/LHC  RA = 7  RV = 56/3/10  PA = 52/23 (34)  PCW = 17 v= 25  Fick cardiac output/index = 7.8/3.9  Thermo CO/CI = 6.1/3.0  PVR = 2.1 Woods  SVR = 1194  FA sat = 97%  PA sat = 79%, 80%  High SVC = 79%  EF 15%  Coronaries normal.  06/02/12 Potassium 4.8 Creatinine 1.8 06/15/12 Potassium 4.5 Creatinine 1.45  CPX 07/14/12: Peak VO2: 21.1 % predicted peak VO2: 65.1%, VE/VCO2 slope: 25.0, OUES: 2.27, Peak RER: 1.12, Ventilatory Threshold: 12.4 % predicted peak VO2: 38.3%.    He returns for follow up. He feels all right.  He is able to ambulate 100 feet prior to getting tired.  He denies orthopnea/PND.  Weight at home 189-192.  He has not taken extra lasix.  Compliant with meds.  No edema.     ROS: All systems negative except as listed in HPI, PMH and Problem List.  Past Medical History  Diagnosis Date  . Hypertension   . Psoriasis     Possible psoriasis in the past  . Alcohol abuse     Current Outpatient Prescriptions  Medication Sig Dispense Refill  . aspirin 81 MG EC tablet Take 1 tablet (81 mg total) by mouth daily.  30 tablet  6  . carvedilol (COREG) 6.25 MG tablet Take 1.5 tablets (9.375 mg total) by mouth 2 (two) times daily with a meal.  90 tablet  3  . digoxin (LANOXIN) 0.25 MG tablet Take 1 tablet (0.25 mg total) by mouth daily.  30 tablet  0  . furosemide (LASIX) 40 MG tablet Take 1 tablet (40 mg total)  by mouth 2 (two) times daily.  60 tablet  2  . hydrALAZINE (APRESOLINE) 50 MG tablet Take 1 tablet (50 mg total) by mouth 3 (three) times daily.  135 tablet  0  . isosorbide mononitrate (IMDUR) 30 MG 24 hr tablet Take 1 tablet (30 mg total) by mouth daily.  30 tablet  0  . lansoprazole (PREVACID) 15 MG capsule Take 15 mg by mouth daily.      Marland Kitchen lisinopril (PRINIVIL,ZESTRIL) 20 MG tablet Take 1 tablet (20 mg total) by mouth daily.  30 tablet  0  . pravastatin (PRAVACHOL) 40 MG tablet Take 1 tablet (40 mg total) by mouth daily.  30 tablet  6  . spironolactone (ALDACTONE) 25 MG tablet Take 0.5 tablets (12.5 mg total) by mouth daily.  30 tablet  0     PHYSICAL EXAM: Filed Vitals:   07/24/12 1000  BP: 126/88  Pulse: 76  Weight: 197 lb 12.8 oz (89.721 kg)  SpO2: 97%    General:  No resp difficulty  HEENT: normal Neck: supple. JVP flat. Carotids 2+ bilaterally; no bruits. No lymphadenopathy or thryomegaly appreciated. Cor: PMI normal. Regular rate & rhythm. No rubs,  Murmurs. +S3 Lungs: clear Abdomen: soft, nontender, nondistended. No hepatosplenomegaly. No  bruits or masses. Good bowel sounds. Extremities: no cyanosis, clubbing, rash, edema Neuro: alert & orientedx3, cranial nerves grossly intact. Moves all 4 extremities w/o difficulty. Affect pleasant.    ASSESSMENT & PLAN:

## 2012-07-28 ENCOUNTER — Telehealth (HOSPITAL_COMMUNITY): Payer: Self-pay | Admitting: Cardiology

## 2012-07-29 ENCOUNTER — Telehealth (HOSPITAL_COMMUNITY): Payer: Self-pay | Admitting: Cardiology

## 2012-07-29 NOTE — Telephone Encounter (Signed)
Needs orders to continue seeing pt or orders to discharge. Ok to give verbal

## 2012-07-29 NOTE — Telephone Encounter (Signed)
Gave ok to continue to see pt

## 2012-07-30 NOTE — Telephone Encounter (Signed)
Opened in error; duplicate.

## 2012-08-06 ENCOUNTER — Ambulatory Visit (HOSPITAL_COMMUNITY)
Admission: RE | Admit: 2012-08-06 | Discharge: 2012-08-06 | Disposition: A | Discharge status: Home / Self Care | Payer: Medicaid Other | Source: Ambulatory Visit | Attending: Internal Medicine | Admitting: Internal Medicine

## 2012-08-06 DIAGNOSIS — I5022 Chronic systolic (congestive) heart failure: Secondary | ICD-10-CM

## 2012-08-06 DIAGNOSIS — I509 Heart failure, unspecified: Secondary | ICD-10-CM | POA: Insufficient documentation

## 2012-08-06 DIAGNOSIS — Z87891 Personal history of nicotine dependence: Secondary | ICD-10-CM | POA: Insufficient documentation

## 2012-08-06 DIAGNOSIS — I517 Cardiomegaly: Secondary | ICD-10-CM

## 2012-08-06 DIAGNOSIS — I10 Essential (primary) hypertension: Secondary | ICD-10-CM | POA: Insufficient documentation

## 2012-08-06 NOTE — Progress Notes (Signed)
  Echocardiogram 2D Echocardiogram has been performed.  Kerra Guilfoil FRANCES 08/06/2012, 10:01 AM

## 2012-08-20 ENCOUNTER — Telehealth: Payer: Self-pay | Admitting: *Deleted

## 2012-08-20 NOTE — Telephone Encounter (Signed)
Spoke to [employer]  On 08/14/12, concerning 48 hour holter monitor ordered on 08/03/12. Per Herbert Seta cancel order for monitor .

## 2012-08-25 ENCOUNTER — Encounter (HOSPITAL_COMMUNITY): Payer: Self-pay

## 2012-08-26 DIAGNOSIS — I509 Heart failure, unspecified: Secondary | ICD-10-CM

## 2012-08-27 NOTE — Addendum Note (Signed)
Encounter addended by: Dolores Patty, MD on: 08/27/2012 11:12 PM<BR>     Documentation filed: Follow-up Section, LOS Section

## 2012-09-02 ENCOUNTER — Ambulatory Visit (HOSPITAL_COMMUNITY)
Admission: RE | Admit: 2012-09-02 | Discharge: 2012-09-02 | Disposition: A | Discharge status: Home / Self Care | Payer: Medicaid Other | Source: Ambulatory Visit | Attending: Internal Medicine | Admitting: Internal Medicine

## 2012-09-02 ENCOUNTER — Other Ambulatory Visit: Payer: Self-pay | Admitting: Physician Assistant

## 2012-09-02 VITALS — BP 138/70 | HR 75 | Wt 198.2 lb

## 2012-09-02 DIAGNOSIS — I10 Essential (primary) hypertension: Secondary | ICD-10-CM

## 2012-09-02 DIAGNOSIS — I428 Other cardiomyopathies: Secondary | ICD-10-CM

## 2012-09-02 MED ORDER — CARVEDILOL 12.5 MG PO TABS: 12.5000 mg | ORAL_TABLET | Freq: Two times a day (BID) | ORAL | Status: DC

## 2012-09-02 MED ORDER — FUROSEMIDE 40 MG PO TABS: 40.0000 mg | ORAL_TABLET | Freq: Two times a day (BID) | ORAL | Status: DC

## 2012-09-02 MED ORDER — SPIRONOLACTONE 25 MG PO TABS: 25.0000 mg | ORAL_TABLET | Freq: Every day | ORAL | Status: DC

## 2012-09-02 NOTE — Patient Instructions (Addendum)
Stop digoxin  Increase spironolactone 1 tablet daily.    Follow up 6 weeks.   Will call tomorrow after I discuss with Dr. Gala Romney.

## 2012-09-02 NOTE — Progress Notes (Addendum)
Weight Range  180  Baseline proBNP  3473    HPI: [name] is a 56 y.o. gentlemen with history of HTN, daily EtOH and recent diagnosis of biventricular heart failure with EF 15%. Admitted 8/21 with progressive SOB, orthopnea. pBNP 3473. LFTs mildly elevated at 44/64 .   8/22 Echo: LVEF 15%, diffuse hypokinesis. Grade 2 diastolic dysfunction. LA mildly dilated. RV moderately reduced. RA mildly dilated. PAPP 44 mmHg.  8/22 Abd u/s: Small volume abdominal ascites. Cannot exclude mild cirrhosis.  8/22 VQ scan: Very low probability for pulmonary embolism.   05/25/12 RHC/LHC  RA = 7  RV = 56/3/10  PA = 52/23 (34)  PCW = 17 v= 25  Fick cardiac output/index = 7.8/3.9  Thermo CO/CI = 6.1/3.0  PVR = 2.1 Woods  SVR = 1194  FA sat = 97%  PA sat = 79%, 80%  High SVC = 79%  EF 15%  Coronaries normal.  06/02/12 Potassium 4.8 Creatinine 1.8 06/15/12 Potassium 4.5 Creatinine 1.45  CPX 07/14/12: Peak VO2: 21.1 % predicted peak VO2: 65.1%, VE/VCO2 slope: 25.0, OUES: 2.27, Peak RER: 1.12, Ventilatory Threshold: 12.4 % predicted peak VO2: 38.3%.    Echo 08/06/12: LVEF 50%, grade 1 diastolic dysfunction.  RV recovered.   [name] returns for follow up today.  [name] feels well.  [name] is ambulating 400 feet without trouble.  [name] denies orthopnea/PND or edema.  [name] is compliant with his meds.  His weight at home 190-192 pounds.       ROS: All systems negative except as listed in HPI, PMH and Problem List.  Past Medical History  Diagnosis Date  . Hypertension   . Psoriasis     Possible psoriasis in the past  . Alcohol abuse     Current Outpatient Prescriptions  Medication Sig Dispense Refill  . aspirin 81 MG EC tablet Take 1 tablet (81 mg total) by mouth daily.  30 tablet  6  . carvedilol (COREG) 12.5 MG tablet Take 1 tablet (12.5 mg total) by mouth 2 (two) times daily with a meal.  60 tablet  2  . digoxin (LANOXIN) 0.25 MG tablet Take 1 tablet (0.25 mg total) by mouth daily.  30 tablet  0  . furosemide  (LASIX) 40 MG tablet Take 1 tablet (40 mg total) by mouth 2 (two) times daily.  60 tablet  2  . hydrALAZINE (APRESOLINE) 50 MG tablet Take 1 tablet (50 mg total) by mouth 3 (three) times daily.  135 tablet  0  . isosorbide mononitrate (IMDUR) 30 MG 24 hr tablet Take 1 tablet (30 mg total) by mouth daily.  30 tablet  0  . lansoprazole (PREVACID) 15 MG capsule Take 15 mg by mouth daily.      Marland Kitchen lisinopril (PRINIVIL,ZESTRIL) 20 MG tablet Take 1 tablet (20 mg total) by mouth daily.  30 tablet  0  . pravastatin (PRAVACHOL) 40 MG tablet Take 1 tablet (40 mg total) by mouth daily.  30 tablet  6  . spironolactone (ALDACTONE) 25 MG tablet Take 0.5 tablets (12.5 mg total) by mouth daily.  30 tablet  0     PHYSICAL EXAM: Filed Vitals:   09/02/12 1124  BP: 138/70  Pulse: 75  Weight: 198 lb 4 oz (89.926 kg)  SpO2: 94%    General:  No resp difficulty  HEENT: normal Neck: supple. JVP flat. Carotids 2+ bilaterally; no bruits. No lymphadenopathy or thryomegaly appreciated. Cor: PMI normal. Regular rate & rhythm. No rubs, gallops, murmurs.  Lungs: clear Abdomen: soft, nontender, nondistended. No hepatosplenomegaly. No bruits or masses. Good bowel sounds. Extremities: no cyanosis, clubbing, rash, edema Neuro: alert & orientedx3, cranial nerves grossly intact. Moves all 4 extremities w/o difficulty. Affect pleasant.    ASSESSMENT & PLAN:

## 2012-09-04 DIAGNOSIS — I428 Other cardiomyopathies: Secondary | ICD-10-CM | POA: Insufficient documentation

## 2012-09-04 HISTORY — DX: Other cardiomyopathies: I42.8

## 2012-09-04 NOTE — Assessment & Plan Note (Addendum)
Have discussed results of echo with patient, left and right systolic function has recovered.  Will stop digoxin.  With hypertension will increase spironolactone to 25 mg daily.  Labs next week with [facility].  Have also discussed sliding scale lasix and holding lasix if weight falls or with symptoms of orthostatic hypotension.

## 2012-09-04 NOTE — Assessment & Plan Note (Signed)
Will increase spironolactone 25 mg daily.  Follow up BMET next week.

## 2012-09-07 ENCOUNTER — Telehealth (HOSPITAL_COMMUNITY): Payer: Self-pay | Admitting: Cardiology

## 2012-09-07 NOTE — Telephone Encounter (Signed)
Called to complain of increase in discomfort in chest. Normally the discomfort would come and go however recently it is not going away.  Arm pain "Feels like someone is pulling on arm". Mild SOB, dizziness

## 2012-09-07 NOTE — Telephone Encounter (Signed)
Spoke w/pt he states he is having the same kind of chest discomfort that he has been having, but he feels like it is always there now, he describes it as "someone is holding my heart and squeezing it tight" he states it does seem to get worse when he is up doing things, sometimes he feels that he can feel his heart beating through his whole body which is worse when he lays down and improves when he sits up, advised coronaries clean by cath in Aug, and EF improved, he states his wt is 191 today which is a stable place for him, no edema, will discuss w/Dr Bensimhon and call him back

## 2012-09-18 ENCOUNTER — Telehealth (HOSPITAL_COMMUNITY): Payer: Self-pay | Admitting: Cardiology

## 2012-09-18 NOTE — Telephone Encounter (Signed)
Can try to take extra lasix 40 mg in the morning for the next 2 days to see if weight comes down.  As last office note states weight should be closer to 190-192 pounds.  Have them call back Monday if weight is not dropping.  Thanks.

## 2012-09-18 NOTE — Telephone Encounter (Signed)
[facility] nurse called to let us know that his weight is slowly going up. Weight today 197.2 no edema, no SOB, denies chest pains, etc   Please advise if necessary

## 2012-09-21 NOTE — Telephone Encounter (Signed)
Spoke w/pt on 12/9 and advised didn't sound cardiac and coronaries clean f/u w/pcp if needed

## 2012-09-24 DIAGNOSIS — I509 Heart failure, unspecified: Secondary | ICD-10-CM

## 2012-10-02 ENCOUNTER — Other Ambulatory Visit (HOSPITAL_COMMUNITY): Payer: Self-pay | Admitting: Adult Health

## 2012-10-02 DIAGNOSIS — I5021 Acute systolic (congestive) heart failure: Secondary | ICD-10-CM

## 2012-10-06 ENCOUNTER — Ambulatory Visit (INDEPENDENT_AMBULATORY_CARE_PROVIDER_SITE_OTHER): Payer: Medicaid Other | Admitting: Family Medicine

## 2012-10-06 VITALS — BP 139/74 | HR 73 | Temp 97.9°F | Wt 199.0 lb

## 2012-10-06 DIAGNOSIS — L309 Dermatitis, unspecified: Secondary | ICD-10-CM

## 2012-10-06 DIAGNOSIS — L259 Unspecified contact dermatitis, unspecified cause: Secondary | ICD-10-CM

## 2012-10-06 DIAGNOSIS — I10 Essential (primary) hypertension: Secondary | ICD-10-CM

## 2012-10-06 DIAGNOSIS — I428 Other cardiomyopathies: Secondary | ICD-10-CM

## 2012-10-06 LAB — BASIC METABOLIC PANEL
Glucose, Bld: 90 mg/dL (ref 70–99)
Potassium: 4.3 mEq/L (ref 3.5–5.3)
Sodium: 139 mEq/L (ref 135–145)

## 2012-10-06 MED ORDER — DESONIDE 0.05 % EX GEL: Freq: Two times a day (BID) | CUTANEOUS | Status: DC

## 2012-10-06 MED ORDER — CALCIPOTRIENE 0.005 % EX CREA: TOPICAL_CREAM | Freq: Two times a day (BID) | CUTANEOUS | Status: DC

## 2012-10-06 NOTE — Assessment & Plan Note (Signed)
-   BMET today 

## 2012-10-06 NOTE — Progress Notes (Signed)
Subjective:     Patient ID: [name], male   DOB: 26-Mar-1956, 57 y.o.   MRN: [id number]  HPI  Chest discomfort: Reports he felt SOB and chest "squeeze" upon standing and feels like his head is "swollen", on the  24th of December and the again a week after. He reports he could not catch his breath and could not walk even a few feet in front him. This relieved after sitting back down and resting for a "couple" of minutes. He states he called his cardiologist office during these times, explaining the situation and his concern and fear, and feels that the representative did not take him seriously. He also endorses he has been feeling palpitations about twice a week. He denies caffeine use. He reports these are the same symptoms he was having prior to going into the hospital to begin with and is worried. His cardiac cath was "clean," and his EF during hospital stay was 15%. His EF has improved, with his last EF recorded at 50% with a grade 1 diastolic dysfunction. The cardiology office have discontinued the patients digoxin and increased his spirolactone to 1 full 25 mg tablet daily. He currently is not experiencing any of these symptoms, but does admit to feeling weaker than normal. His hypertension has been controlled well on medications and is stable today. However, this patient feels it is still higher than his normal. He admits he has strayed off his normal diet for the holidays as well.  He has filled out his papers for disability and is waiting to be approved for what I understand was due to his EF being only 15% and the condition he was in after hospital stay from a cardiac stand point.    The patient has another appointment with the cardiologist on 10/14/2012.  Eczema: Patient has chronic eczema on his chest, arms and back. He has used Dovonex and  Desonide  In the past and would like a prescription for both today.    Review of Systems  All other systems reviewed and are negative.        Objective:   Physical Exam  Constitutional: He appears well-developed and well-nourished. No distress.       Seems tired more than usual.  Very nice gentleman.  Heart: RRR. 1/6 SM. No gallops or rubs.  Lungs: CTAB.  Abd: Soft. NT.ND. No HSM.  Ext: No edema.  BP 139/74  Pulse 73  Temp 97.9 F (36.6 C) (Oral)  Wt 199 lb (90.266 kg)

## 2012-10-06 NOTE — Patient Instructions (Signed)
Chest Pain (Nonspecific) It is often hard to give a specific diagnosis for the cause of chest pain. There is always a chance that your pain could be related to something serious, such as a heart attack or a blood clot in the lungs. You need to follow up with your caregiver for further evaluation. CAUSES   Heartburn.  Pneumonia or bronchitis.  Anxiety or stress.  Inflammation around your heart (pericarditis) or lung (pleuritis or pleurisy).  A blood clot in the lung.  A collapsed lung (pneumothorax). It can develop suddenly on its own (spontaneous pneumothorax) or from injury (trauma) to the chest.  Shingles infection (herpes zoster virus). The chest wall is composed of bones, muscles, and cartilage. Any of these can be the source of the pain.  The bones can be bruised by injury.  The muscles or cartilage can be strained by coughing or overwork.  The cartilage can be affected by inflammation and become sore (costochondritis). DIAGNOSIS  Lab tests or other studies, such as X-rays, electrocardiography, stress testing, or cardiac imaging, may be needed to find the cause of your pain.  TREATMENT   Treatment depends on what may be causing your chest pain. Treatment may include:  Acid blockers for heartburn.  Anti-inflammatory medicine.  Pain medicine for inflammatory conditions.  Antibiotics if an infection is present.  You may be advised to change lifestyle habits. This includes stopping smoking and avoiding alcohol, caffeine, and chocolate.  You may be advised to keep your head raised (elevated) when sleeping. This reduces the chance of acid going backward from your stomach into your esophagus.  Most of the time, nonspecific chest pain will improve within 2 to 3 days with rest and mild pain medicine. HOME CARE INSTRUCTIONS   If antibiotics were prescribed, take your antibiotics as directed. Finish them even if you start to feel better.  For the next few days, avoid physical  activities that bring on chest pain. Continue physical activities as directed.  Do not smoke.  Avoid drinking alcohol.  Only take over-the-counter or prescription medicine for pain, discomfort, or fever as directed by your caregiver.  Follow your caregiver's suggestions for further testing if your chest pain does not go away.  Keep any follow-up appointments you made. If you do not go to an appointment, you could develop lasting (chronic) problems with pain. If there is any problem keeping an appointment, you must call to reschedule. SEEK MEDICAL CARE IF:   You think you are having problems from the medicine you are taking. Read your medicine instructions carefully.  Your chest pain does not go away, even after treatment.  You develop a rash with blisters on your chest. SEEK IMMEDIATE MEDICAL CARE IF:   You have increased chest pain or pain that spreads to your arm, neck, jaw, back, or abdomen.  You develop shortness of breath, an increasing cough, or you are coughing up blood.  You have severe back or abdominal pain, feel nauseous, or vomit.  You develop severe weakness, fainting, or chills.  You have a fever. THIS IS AN EMERGENCY. Do not wait to see if the pain will go away. Get medical help at once. Call your local emergency services (911 in [place] Do not drive yourself to the hospital. MAKE SURE YOU:   Understand these instructions.  Will watch your condition.  Will get help right away if you are not doing well or get worse. Document Released: 06/26/2005 Document Revised: 12/09/2011 Document Reviewed: 04/21/2008 [employer] Patient Information 2013 [employer].  

## 2012-10-07 ENCOUNTER — Telehealth: Payer: Self-pay | Admitting: Family Medicine

## 2012-10-07 NOTE — Telephone Encounter (Signed)
Will forward to white team.  

## 2012-10-07 NOTE — Telephone Encounter (Signed)
Pt cannot afford the dovanex and wants to know if she can call in something that is on the list - is also asking about the nitro pills

## 2012-10-08 ENCOUNTER — Encounter: Payer: Self-pay | Admitting: Family Medicine

## 2012-10-08 MED ORDER — TRIAMCINOLONE ACETONIDE 0.5 % EX OINT: TOPICAL_OINTMENT | Freq: Two times a day (BID) | CUTANEOUS | Status: DC

## 2012-10-08 MED ORDER — NITROGLYCERIN 0.4 MG SL SUBL: 0.4000 mg | SUBLINGUAL_TABLET | SUBLINGUAL | Status: DC | PRN

## 2012-10-08 NOTE — Assessment & Plan Note (Addendum)
-   Patient states he never filled Nitro previously because he was scared to use it. However, after explaining its purpose and administration to him, he has decided he will carry it on him.  - Will write for another prescription for nitro tabs today. - Explained emergency protocol if nitro x2 does not improve his symptoms .  - Asked him to follow up with me after cardio appt

## 2012-10-08 NOTE — Telephone Encounter (Signed)
Patient can not afford medication he requested for his eczema. I have called in triamcinolone ointment. Please advise patient to not use this on his face. Please call the patient and inform him his prescription has been placed and I re-entered the [facility] prescription as well. He has any problems to please call back. Thank you.

## 2012-10-08 NOTE — Assessment & Plan Note (Signed)
-   Patient has called back and stated he can not afford the desonide or dovonex he requested and would like another prescription called in.  - Will write for triamcinolone - but will not be able to use this on his face.

## 2012-10-09 NOTE — Telephone Encounter (Signed)
Called and informed pt that Dr. Claiborne Billings has changed the rx for his eczema and NOT to apply this to his face also she re-sent the nitro tabs. Pt is asking that Dr. Claiborne Billings call in something that can be applied to his face for the eczema. I told him that I would forward this to her.Laureen Ochs, Viann Shove

## 2012-10-12 MED ORDER — HYDROCORTISONE 1 % EX LOTN: TOPICAL_LOTION | Freq: Two times a day (BID) | CUTANEOUS | Status: DC

## 2012-10-12 NOTE — Addendum Note (Signed)
Addended by: Felix Pacini A on: 10/12/2012 03:23 PM   Modules accepted: Orders

## 2012-10-12 NOTE — Telephone Encounter (Signed)
Advised pt of risks of hydrocortisone and to use cetaphil on a daily- per Dr Claiborne Billings. Pt voiced understanding.

## 2012-10-12 NOTE — Telephone Encounter (Signed)
I have called in hydrocortisone lotion 1% for his face. However, even low dose steroid products should not be used long term on the face. I suggest using this product when his eczema is flared, otherwise I recommend Cetaphil cream that is sold over the counter for daily moisturizing directly after bathing/showering, without towel drying.

## 2012-10-14 ENCOUNTER — Ambulatory Visit (HOSPITAL_COMMUNITY)
Admission: RE | Admit: 2012-10-14 | Discharge: 2012-10-14 | Disposition: A | Discharge status: Home / Self Care | Payer: Medicaid Other | Source: Ambulatory Visit | Attending: Internal Medicine | Admitting: Internal Medicine

## 2012-10-14 ENCOUNTER — Encounter (HOSPITAL_COMMUNITY): Payer: Self-pay

## 2012-10-14 VITALS — BP 134/90 | HR 64 | Wt 190.8 lb

## 2012-10-14 DIAGNOSIS — R140 Abdominal distension (gaseous): Secondary | ICD-10-CM

## 2012-10-14 DIAGNOSIS — I5022 Chronic systolic (congestive) heart failure: Secondary | ICD-10-CM

## 2012-10-14 DIAGNOSIS — R072 Precordial pain: Secondary | ICD-10-CM | POA: Insufficient documentation

## 2012-10-14 DIAGNOSIS — I5021 Acute systolic (congestive) heart failure: Secondary | ICD-10-CM

## 2012-10-14 DIAGNOSIS — I10 Essential (primary) hypertension: Secondary | ICD-10-CM | POA: Insufficient documentation

## 2012-10-14 NOTE — Progress Notes (Signed)
Weight Range  180  Baseline proBNP  3473    HPI: [name] is a 57 y.o. gentlemen with history of HTN, daily EtOH and recent diagnosis of biventricular heart failure with EF 15%. Admitted 8/21 with progressive SOB, orthopnea. pBNP 3473. LFTs mildly elevated at 44/64 .   8/22 Echo: LVEF 15%, diffuse hypokinesis. Grade 2 diastolic dysfunction. LA mildly dilated. RV moderately reduced. RA mildly dilated. PAPP 44 mmHg.  8/22 Abd u/s: Small volume abdominal ascites. Cannot exclude mild cirrhosis.  8/22 VQ scan: Very low probability for pulmonary embolism.   05/25/12 RHC/LHC  RA = 7  RV = 56/3/10  PA = 52/23 (34)  PCW = 17 v= 25  Fick cardiac output/index = 7.8/3.9  Thermo CO/CI = 6.1/3.0  PVR = 2.1 [place]  SVR = 1194  FA sat = 97%  PA sat = 79%, 80%  High SVC = 79%  EF 15%  Coronaries normal.  06/02/12 Potassium 4.8 Creatinine 1.8 06/15/12 Potassium 4.5 Creatinine 1.45  CPX 07/14/12: Peak VO2: 21.1 % predicted peak VO2: 65.1%, VE/VCO2 slope: 25.0, OUES: 2.27, Peak RER: 1.12, Ventilatory Threshold: 12.4 % predicted peak VO2: 38.3%.    Echo 08/06/12: LVEF 50%, grade 1 diastolic dysfunction.  RV recovered.   He returns for follow up today.  He feels ok today. Main compliant is constant chest tightness. Says it is worse since last time. It is there 24/7. No change with exertion. No dyspnea or diaphoresis.  Rates pain as 9/10.  Weight at home 192.8 this am.  Has gotten up to 197 over the holidays but this has improved.  Denies edema, orthopnea, or edema.  Compliant with meds.    ROS: All systems negative except as listed in HPI, PMH and Problem List.  Past Medical History  Diagnosis Date  . Hypertension   . Psoriasis     Possible psoriasis in the past  . Alcohol abuse     Current Outpatient Prescriptions  Medication Sig Dispense Refill  . aspirin 81 MG EC tablet Take 1 tablet (81 mg total) by mouth daily.  30 tablet  6  . carvedilol (COREG) 12.5 MG tablet Take 1 tablet (12.5 mg  total) by mouth 2 (two) times daily with a meal.  60 tablet  2  . furosemide (LASIX) 40 MG tablet Take 1 tablet (40 mg total) by mouth 2 (two) times daily.  60 tablet  2  . hydrALAZINE (APRESOLINE) 50 MG tablet TAKE 1 TABLET BY MOUTH 3 TIMES DAILY  102 tablet  PRN  . isosorbide mononitrate (IMDUR) 30 MG 24 hr tablet Take 1 tablet (30 mg total) by mouth daily.  30 tablet  0  . lisinopril (PRINIVIL,ZESTRIL) 20 MG tablet TAKE 1 TABLET BY MOUTH DAILY  34 tablet  PRN  . pravastatin (PRAVACHOL) 40 MG tablet Take 1 tablet (40 mg total) by mouth daily.  30 tablet  6  . spironolactone (ALDACTONE) 25 MG tablet Take 1 tablet (25 mg total) by mouth daily.  30 tablet  0  . hydrocortisone 1 % lotion Apply topically 2 (two) times daily.  118 mL  3  . lansoprazole (PREVACID) 15 MG capsule Take 15 mg by mouth daily.      . nitroGLYCERIN (NITROSTAT) 0.4 MG SL tablet Place 1 tablet (0.4 mg total) under the tongue every 5 (five) minutes as needed for chest pain.  30 tablet  3  . triamcinolone ointment (KENALOG) 0.5 % Apply topically 2 (two) times daily. Do not use  on the face.  30 g  0     PHYSICAL EXAM: Filed Vitals:   10/14/12 1029  BP: 134/90  Pulse: 64  Weight: 190 lb 12.8 oz (86.546 kg)  SpO2: 98%   BP checked personally 122/76 R 120/80 L   General:  No resp difficulty  HEENT: normal Neck: supple. JVP flat. Carotids 2+ bilaterally; no bruits. No lymphadenopathy or thryomegaly appreciated. Cor: PMI normal. Regular rate & rhythm. No rubs, gallops, murmurs.  Lungs: clear Abdomen: soft, nontender, nondistended. No hepatosplenomegaly. No bruits or masses. Good bowel sounds. Aortic impulse not widened or overly prominent Extremities: no cyanosis, clubbing, rash, edema. Multiple areas of vitiligo and skin lesions Neuro: alert & orientedx3, cranial nerves grossly intact. Moves all 4 extremities w/o difficulty. Affect pleasant.    ASSESSMENT & PLAN:

## 2012-10-14 NOTE — Assessment & Plan Note (Signed)
Blood pressure well controlled on my manual check. Continue current regimen.

## 2012-10-14 NOTE — Assessment & Plan Note (Signed)
Patient seen and examined with Ulyess Blossom, PA-C. We discussed all aspects of the encounter. I agree with the assessment as stated above. My thoughts below.  EF recovered. No clinical HF. Continue current regimen. Need to keep BP controlled.

## 2012-10-14 NOTE — Assessment & Plan Note (Addendum)
Doubt this is angina as recent cath was fine. ECG is normal today in clinic except for LVH. Had VQ a few months ago which was normal. Given h/o severe HTN is at risk for Ao dissection or AAA. BP equal in both arms.  Will plan CT chest and abdomen to further evaluate. Aware of risk of contrast nephropathy.

## 2012-10-14 NOTE — Patient Instructions (Addendum)
[place] [facility] 10/16/12 @ 8:45 Non-Cardiac CT scanning, (CAT scanning), is a noninvasive, special x-ray that produces cross-sectional images of the body using x-rays and a computer. CT scans help physicians diagnose and treat medical conditions. For some CT exams, a contrast material is used to enhance visibility in the area of the body being studied. CT scans provide greater clarity and reveal more details than regular x-ray exams.

## 2012-10-15 ENCOUNTER — Ambulatory Visit (HOSPITAL_COMMUNITY): Payer: Medicaid Other

## 2012-10-15 ENCOUNTER — Encounter (HOSPITAL_COMMUNITY): Payer: Self-pay

## 2012-10-15 ENCOUNTER — Ambulatory Visit (HOSPITAL_COMMUNITY): Admission: RE | Admit: 2012-10-15 | Payer: Medicaid Other | Source: Ambulatory Visit

## 2012-10-15 ENCOUNTER — Ambulatory Visit (HOSPITAL_COMMUNITY)
Admission: RE | Admit: 2012-10-15 | Discharge: 2012-10-15 | Disposition: A | Discharge status: Home / Self Care | Payer: Medicaid Other | Source: Ambulatory Visit | Attending: Internal Medicine | Admitting: Internal Medicine

## 2012-10-15 ENCOUNTER — Telehealth (HOSPITAL_COMMUNITY): Payer: Self-pay | Admitting: Cardiology

## 2012-10-15 DIAGNOSIS — I2510 Atherosclerotic heart disease of native coronary artery without angina pectoris: Secondary | ICD-10-CM | POA: Insufficient documentation

## 2012-10-15 DIAGNOSIS — R0789 Other chest pain: Secondary | ICD-10-CM | POA: Insufficient documentation

## 2012-10-15 DIAGNOSIS — R140 Abdominal distension (gaseous): Secondary | ICD-10-CM

## 2012-10-15 DIAGNOSIS — K5730 Diverticulosis of large intestine without perforation or abscess without bleeding: Secondary | ICD-10-CM | POA: Insufficient documentation

## 2012-10-15 DIAGNOSIS — R142 Eructation: Secondary | ICD-10-CM | POA: Insufficient documentation

## 2012-10-15 DIAGNOSIS — R0602 Shortness of breath: Secondary | ICD-10-CM | POA: Insufficient documentation

## 2012-10-15 DIAGNOSIS — I700 Atherosclerosis of aorta: Secondary | ICD-10-CM | POA: Insufficient documentation

## 2012-10-15 DIAGNOSIS — R141 Gas pain: Secondary | ICD-10-CM | POA: Insufficient documentation

## 2012-10-15 DIAGNOSIS — R911 Solitary pulmonary nodule: Secondary | ICD-10-CM | POA: Insufficient documentation

## 2012-10-15 MED ORDER — IOHEXOL 350 MG/ML SOLN
100.0000 mL | Freq: Once | INTRAVENOUS | Status: AC | PRN
  Administered 2012-10-15: 100 mL via INTRAVENOUS

## 2012-10-15 NOTE — Telephone Encounter (Signed)
CALLED TO REQUEST FURTHER ORDER TO CONTINUE SEEING PT

## 2012-10-15 NOTE — Telephone Encounter (Signed)
Spoke w/RN from [facility] she states it is time to d/c pt unless we feel strongly otherwise, pt has been stable gave ok to d/c from home care

## 2012-10-16 ENCOUNTER — Other Ambulatory Visit (HOSPITAL_COMMUNITY): Payer: Medicaid Other

## 2012-10-19 ENCOUNTER — Telehealth (HOSPITAL_COMMUNITY): Payer: Self-pay | Admitting: *Deleted

## 2012-10-19 NOTE — Telephone Encounter (Signed)
Opened in error

## 2012-10-20 ENCOUNTER — Encounter (HOSPITAL_COMMUNITY): Payer: Self-pay | Admitting: *Deleted

## 2012-11-03 ENCOUNTER — Encounter: Payer: Self-pay | Admitting: Family Medicine

## 2012-11-03 ENCOUNTER — Ambulatory Visit (INDEPENDENT_AMBULATORY_CARE_PROVIDER_SITE_OTHER): Payer: Medicaid Other | Admitting: Family Medicine

## 2012-11-03 VITALS — BP 168/87 | HR 65 | Temp 98.1°F | Ht 71.75 in | Wt 197.1 lb

## 2012-11-03 DIAGNOSIS — R911 Solitary pulmonary nodule: Secondary | ICD-10-CM

## 2012-11-03 DIAGNOSIS — R0989 Other specified symptoms and signs involving the circulatory and respiratory systems: Secondary | ICD-10-CM

## 2012-11-03 DIAGNOSIS — I10 Essential (primary) hypertension: Secondary | ICD-10-CM

## 2012-11-03 DIAGNOSIS — R0600 Dyspnea, unspecified: Secondary | ICD-10-CM | POA: Insufficient documentation

## 2012-11-03 DIAGNOSIS — R0609 Other forms of dyspnea: Secondary | ICD-10-CM

## 2012-11-03 HISTORY — DX: Solitary pulmonary nodule: R91.1

## 2012-11-03 NOTE — Assessment & Plan Note (Signed)
-   Chest pain essentially ruled out by cardiology. EF 15% --> 50% improved.  - Baseline PFT through Dr. Raymondo Band recommended. Will pursue pulmonology etiology for dyspnea pending results. - Incidental pulmonary nodule reveled on CTA 09/2012 - former smoker (quit date 03/2012)  - F/U: pending results.

## 2012-11-03 NOTE — Patient Instructions (Addendum)
Pulmonary Nodule A pulmonary (lung) nodule is small, round growth in the lung. The size of a pulmonary nodule can be as small as a pencil eraser (1/5 inch or 4 millmeters) to a little bigger than your biggest toenail (1 inch or 25 millimeters). A pulmonary nodule is usually an unplanned finding. It may be found on a chest X-ray or a computed tomography (CT) scan when you have imaging tests of your lungs done. When a pulmonary nodule is found, tests will be done to determine if the nodule is benign (not cancerous) or malignant (cancerous). Follow-up treatment or testing is based on the size of the pulmonary nodule and your risk of getting lung cancer.  CAUSES Causes of pulmonary nodules can vary.  Benign pulmonary nodules  can be caused from different things. Some of these things include:  Infection. This can be a common cause of a benign pulmonary nodule. The infection may be active (a current infection) or an old infection that is no longer active. Three types of infections can cause a pulmonary nodule. These are:  Bacterial Infection.  Fungal infection.  Viral Infections.  Hematoma. This is a bruise in the lung. A hematoma can happen from an injury to your chest.  Some common diseases can lead to benign pulmonary nodules. For example, rheumatoid arthritis can be a cause of a pulmonary nodule.  Other unusual things can cause a benign pulmonary nodule. These can include:  Having had tuberculosis.  Rare diseases, such as a lung cyst. Malignant pulmonary nodules.  These are cancerous growths. The cancer may have:  Started in the lung. Some lung cancers first detected as a pulmonary nodule.  Spread to the lung from cancer somewhere else in the body. This is called metastatic cancer.  Certain risk factors make a cancerous pulmonary nodule more likely. They include:  Age. As people get older, a pulmonary nodule is more likely to be cancerous.  Cancer history. If one of your immediate  family members has had cancer, you have a higher risk of developing cancer.  Smoking. This includes people who currently smoke and those who have quit. DIAGNOSIS To diagnose whether a pulmonary nodule is benign or malignant, a variety of tests will be done. This includes things such as:  Health history. Questions regarding your current health, past health, and family health will be asked.  Blood tests. Results of blood work can show:  Tumor markers for cancer.  Any type of infection.  A skin test called a tuberculin (TB) test may be done. This test can tell if you have been exposed to the germ that causes tuberculosis.  Imaging tests. These take pictures of your lungs. Types of imaging tests include:  Chest X-ray. This can help in several ways. An X-ray gives a close-up look at the pulmonary nodule. A new X-ray can be compared with any X-rays you have had in the past.   Computed tomography  (CT) scan. This test shows smaller pulmonary nodules more clearly than an X-ray.  Positron emission tomography  (PET) scan. This is a test that uses a radioactive substance to identify a pulmonary nodule. A safe amount of radioactive substance is injected into the blood stream. Then, the scan takes a picture of the pulmonary nodule. A malignant pulmonary nodule will absorb the substance faster than a benign pulmonary nodule. The radioactive substance is eliminated from your body in your urine.  Biopsy.  This removes a tiny piece of the pulmonary nodule so it can be checked  under a microscope. Medicine will be given to help keep you relaxed and pain free when a biopsy is done. Types of biopsies include:  Bronchoscopy . This is a surgical procedure. It can be used for pulmonary nodules that are close to the airways in the lung. It uses a scope (a thin tube) with a tiny camera and light on the end. The scope is put in the windpipe. Your caregiver can then see inside the lung. A tiny tool put through the  scope is used to take a small sample of the pulmonary nodule tissue.  Transthoracic needle aspiration . This method is used if the pulmonary nodule is far away from the air passages in the lung. A long, thin needle is put through the chest into the lung nodule. A CT scan is done at the same time which can make it easier to locate the pulmonary nodule.  Surgical lung biopsy . This is a surgical procedure in which the pulmonary nodule is removed. This is usually recommended when the pulmonary nodule is most likely malignant or a biopsy cannot be obtained by either bronchoscopy or transthoracic needle aspiration. PULMONARY NODULE FOLLOW-UP RECOMMENDATIONS The frequency of pulmonary nodule follow-up is based on your risk factors and size of the pulmonary nodule. If your caregiver suspects the pulmonary nodule is cancerous or the pulmonary nodule changes during any of the follow-up CT scans, additional testing or biopsies will be done.   If you have no or low risk of getting lung cancer (non-smoker, no personal cancer history), recommended follow-up is based on the following pulmonary nodule size:  A pulmonary nodule that is < 4 mm does not require any follow-up.  A pulmonary nodule that is 4 to 6 mm should be re-imaged by CT scan in 12 months.  A pulmonary nodule that is 6 to 8 mm should be re-imaged by CT scan at 6 to 12 months and then again at 18 to 24 months if no change in size.  A pulmonary nodule > 8 mm in size should be followed closely and re-imaged by CT scan at 3, 9, and 24 months.   If you are at risk of getting lung cancer (current or former smoker, family history of cancer), recommended follow-up is based on the following pulmonary nodule size:  A pulmonary nodule that is < 4 mm in size should be re-imaged by CT scan in 12 months.  A pulmonary nodule that is 4 to 6 mm in size should be re-imaged by CT scan at 6 to 12 months and again at 18 to 24 months.  A pulmonary nodule that is  6 to 8 mm in size should be re-imaged by CT scan at 3, 9, and 24 months.  A pulmonary nodule > 8 mm in size should be followed closely and re-imaged by CT scan at 3, 9, and 24 months. SEEK MEDICAL CARE IF: While waiting for test results to determine what type of pulmonary nodule you have, be sure to contact your caregiver if you:  Have trouble breathing when you are active.  Feel sick or unusually tired.  Do not feel like eating.  Lose weight without trying to.  Develop chills or night sweats.  Mild or moderate fevers generally have no long-term effects and often do not require treatment. There are a few exceptions (see below). SEEK IMMEDIATE MEDICAL CARE IF:  You cannot catch your breath or you begin wheezing.  You cannot stop coughing.  You cough up blood.  You feel like you are going to pass out or become dizzy.  You have sudden chest pain.  You have a fever or persistent symptoms for more than 72 hours.  You have a fever and your symptoms suddenly get worse. MAKE SURE YOU   Understand these instructions.  Will watch your condition.  Will get help right away if you are not doing well or get worse. Document Released: 07/14/2009 Document Revised: 12/09/2011 Document Reviewed: 07/14/2009 [facility] Patient Information 2013 [place], [place].

## 2012-11-03 NOTE — Assessment & Plan Note (Addendum)
-   BP today: 168/87. Patient states this is his normal for the morning and it will fall to normal range mid-morning. Has been well controlled at many office visits over the last two weeks.   - Continue current medication therapy.  - F/U: 6 mos

## 2012-11-03 NOTE — Progress Notes (Signed)
Subjective:     Patient ID: [name], male   DOB: [birth year], 57 y.o.   MRN: [id number]  HPI Patient presents today for f/u on his cardiac condition and HTN. He has recently been seen at his cardiologist and had a CTA (ABd/pelvis/aorta) performed . He has been essentially cleared, from the cardiac prospective, for his recurrent chest pain. He is experiencing some frustration with medical team not being able to pinpoint the exact cause of chest pain. He continues to experience chest pain, "worse than his hospital admission" on his left side (2 times since last visit 10/06/12). He has constant complaints of  intermittent episodes of shortness of breath, but endorses he can walk to his mailbox daily and up a flight a steps without difficulty most days. He feels like his heart is racing and skipping at times. This also has not occurred as frequent (about 1 times since last appointment). He describes his head as "swollen" feeling during these episodes. He rarely uses caffeine, nonsmoker (quit 03/2012) and denies current use of alcohol.   Lung Nodule: RLL 4mm noncalcified nodule (former smoker/alcoholic history) incidental  finding on CTA. Discussed finding with patient today, given his history this is potentially concerning. Discussed the importance of monitoring this closely over the next year.  Patient expressed understanding.   Review of Systems  Constitutional: Negative for fever.  Cardiovascular: Negative for leg swelling.  Neurological: Negative for dizziness.      Objective:   Physical Exam  Constitutional: He is oriented to person, place, and time.       Pleasant. NAD.   Cardiovascular: Normal rate and regular rhythm.        1/6 SM   Pulmonary/Chest: Effort normal and breath sounds normal. No respiratory distress. He has no wheezes. He has no rales. He exhibits no tenderness.  Abdominal: Soft. Bowel sounds are normal. He exhibits no distension and no mass. There is no hepatosplenomegaly.  There is tenderness in the right lower quadrant. There is no rebound and no guarding.       Mild RLQ pain'  Musculoskeletal: He exhibits no edema.  Neurological: He is alert and oriented to person, place, and time.  Psychiatric: He has a normal mood and affect.  BP 168/87  Pulse 65  Temp 98.1 F (36.7 C) (Oral)  Ht 5' 11.75" (1.822 m)  Wt 197 lb 1.6 oz (89.404 kg)  BMI 26.92 kg/m2

## 2012-11-03 NOTE — Assessment & Plan Note (Signed)
-   incidental find on CTA RLL 4mm noncalcified - Will closely monitor with repeat CT in 6 months d/t his smoking history -f/u 6 months or PRN

## 2012-11-03 NOTE — Assessment & Plan Note (Signed)
-   EF improved to 50% - Patient has the potential of returning to work if cleared from a cardiac standpoint. He is a [employer] and seems to be pleased with the idea of getting back to work. - Advised him to contact cardiology office to receive a letter of clearance to be submitted to his employer.  - Would recommend the possibility of cardiopulmonary rehab to recondition him. Advised him this is something that would have to be discussed with his cardiologist.  - [facility] would help in anyway possible to help with his return

## 2012-11-09 ENCOUNTER — Telehealth: Payer: Self-pay | Admitting: Family Medicine

## 2012-11-09 DIAGNOSIS — L309 Dermatitis, unspecified: Secondary | ICD-10-CM

## 2012-11-09 MED ORDER — TRIAMCINOLONE ACETONIDE 0.5 % EX OINT: TOPICAL_OINTMENT | Freq: Two times a day (BID) | CUTANEOUS | Status: DC

## 2012-11-09 NOTE — Telephone Encounter (Signed)
Please advise patient, he is already receiving the largest available tube size via the selections I have available through epic. I have attempted to enter a larger amount and we can see if they placed it in a larger tube when he refills this next prescript. Pleaser remind him this should be used sparingly and only two times a day.

## 2012-11-09 NOTE — Telephone Encounter (Signed)
Wants to know if he can get the Triamcinolone in a larger tube - he runs out too quickly on the small ones.  pls advise

## 2012-11-09 NOTE — Telephone Encounter (Signed)
Left detailed message on voicemail,request for a rtn call if any question. Macarius Ruark, Virgel Bouquet

## 2012-11-10 ENCOUNTER — Other Ambulatory Visit: Payer: Self-pay | Admitting: Family Medicine

## 2012-11-10 ENCOUNTER — Other Ambulatory Visit (HOSPITAL_COMMUNITY): Payer: Self-pay | Admitting: Physician Assistant

## 2012-11-19 ENCOUNTER — Encounter: Payer: Self-pay | Admitting: Pharmacist

## 2012-11-19 ENCOUNTER — Ambulatory Visit (INDEPENDENT_AMBULATORY_CARE_PROVIDER_SITE_OTHER): Payer: Medicaid Other | Admitting: Pharmacist

## 2012-11-19 VITALS — BP 126/84 | HR 69 | Ht 71.5 in | Wt 203.5 lb

## 2012-11-19 DIAGNOSIS — R0609 Other forms of dyspnea: Secondary | ICD-10-CM

## 2012-11-19 NOTE — Progress Notes (Signed)
Patient ID: [name], male   DOB: 1956/01/04, 57 y.o.   MRN: [id number] Reviewed: Agree with Dr. Macky Lower documentation and management.

## 2012-11-19 NOTE — Assessment & Plan Note (Signed)
Spirometry evaluation with Pre and Post Bronchodilator reveals near normal but lower than average lung function.  Patient has been experiencing shortness of breath for similar time as cardiac disease (heart failure)  and continues to have dyspnea despite therapy for his heart disease.  He denies use or benefit from any respiratory inhalers. PFT demonstrated minimal (< %5) change with albuterol.  Continue current treatment plan at this time.  Reviewed results of pulmonary function tests.  Pt verbalized understanding of results.  Written pt instructions provided.  F/U Clinic visit PRN Dr. Claiborne Billings.  Total time in face to face counseling 45 minutes.  Patient seen with Drue Stager PharmD, Resident.

## 2012-11-19 NOTE — Progress Notes (Signed)
  Subjective:    Patient ID: [name], male    DOB: March 30, 1956, 57 y.o.   MRN: [id number]  HPI Patient arrives in good spirits for FPT evaluation at the request of Dr. Claiborne Billings.   Patient reports cardiac history of several years duration and states his shortness of breath and difficulty breathing was much worse when his heart failure was uncontrolled.    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   He is unsure if he has had PFT testing in the past.  He denies any use of any inhalation medications.    Review of Systems     Objective:   Physical Exam    See Documentation Flowsheet (discrete results - PFTs) for complete Spirometry results. Patient provided good effort while attempting spirometry.   Lung Age = 89 Albuterol Neb  Lot# [id number]     Exp. 12/2013       Assessment & Plan:  Spirometry evaluation with Pre and Post Bronchodilator reveals near normal but lower than average lung function.  Patient has been experiencing shortness of breath for similar time as cardiac disease (heart failure)  and continues to have dyspnea despite therapy for his heart disease.  He denies use or benefit from any respiratory inhalers. PFT demonstrated minimal (< %5) change with albuterol.  Continue current treatment plan at this time.  Reviewed results of pulmonary function tests.  Pt verbalized understanding of results.  Written pt instructions provided.  F/U Clinic visit PRN Dr. Claiborne Billings.  Total time in face to face counseling 45 minutes.  Patient seen with Drue Stager PharmD, Resident.

## 2012-11-19 NOTE — Patient Instructions (Addendum)
It was nice to meet you today, here are some goals for you to work on  1. Quit smoking to help improve your breathing 2. Increase your exercise focusing on walking more frequently 3. Come back to see Dr. Claiborne Billings for follow up on your progress

## 2013-04-07 ENCOUNTER — Telehealth: Payer: Self-pay | Admitting: Family Medicine

## 2013-04-07 NOTE — Telephone Encounter (Signed)
Patient calling to inquire about financial assistance.  States he was not able to qualify for disability and lost his Medicaid.  Not working at this time and receives financial support/assistance from his children.  Patient informed to call our eligibility specialist Abundio Miu) for an appt when she returns next week.  Gaylene Brooks, RN

## 2013-04-13 ENCOUNTER — Telehealth: Payer: Self-pay | Admitting: Family Medicine

## 2013-04-13 ENCOUNTER — Emergency Department (HOSPITAL_COMMUNITY): Payer: Medicaid Other

## 2013-04-13 ENCOUNTER — Emergency Department (HOSPITAL_COMMUNITY)
Admission: EM | Admit: 2013-04-13 | Discharge: 2013-04-13 | Disposition: A | Discharge status: Home / Self Care | Payer: Medicaid Other | Attending: Emergency Medicine | Admitting: Emergency Medicine

## 2013-04-13 ENCOUNTER — Encounter (HOSPITAL_COMMUNITY): Payer: Self-pay | Admitting: Family Medicine

## 2013-04-13 DIAGNOSIS — F17200 Nicotine dependence, unspecified, uncomplicated: Secondary | ICD-10-CM | POA: Insufficient documentation

## 2013-04-13 DIAGNOSIS — R079 Chest pain, unspecified: Secondary | ICD-10-CM | POA: Insufficient documentation

## 2013-04-13 DIAGNOSIS — Z8679 Personal history of other diseases of the circulatory system: Secondary | ICD-10-CM | POA: Insufficient documentation

## 2013-04-13 DIAGNOSIS — Z9119 Patient's noncompliance with other medical treatment and regimen: Secondary | ICD-10-CM | POA: Insufficient documentation

## 2013-04-13 DIAGNOSIS — Z79899 Other long term (current) drug therapy: Secondary | ICD-10-CM | POA: Insufficient documentation

## 2013-04-13 DIAGNOSIS — Z7982 Long term (current) use of aspirin: Secondary | ICD-10-CM | POA: Insufficient documentation

## 2013-04-13 DIAGNOSIS — Z872 Personal history of diseases of the skin and subcutaneous tissue: Secondary | ICD-10-CM | POA: Insufficient documentation

## 2013-04-13 DIAGNOSIS — Z91199 Patient's noncompliance with other medical treatment and regimen due to unspecified reason: Secondary | ICD-10-CM | POA: Insufficient documentation

## 2013-04-13 DIAGNOSIS — I10 Essential (primary) hypertension: Secondary | ICD-10-CM | POA: Insufficient documentation

## 2013-04-13 LAB — BASIC METABOLIC PANEL
BUN: 10 mg/dL (ref 6–23)
Chloride: 103 mEq/L (ref 96–112)
Creatinine, Ser: 1.46 mg/dL — ABNORMAL HIGH (ref 0.50–1.35)
GFR calc Af Amer: 59 mL/min — ABNORMAL LOW (ref >90)
Glucose, Bld: 98 mg/dL (ref 70–99)
Potassium: 4 mEq/L (ref 3.5–5.1)

## 2013-04-13 LAB — CBC
HCT: 41.6 % (ref 39.0–52.0)
Hemoglobin: 13.8 g/dL (ref 13.0–17.0)
MCHC: 33.2 g/dL (ref 30.0–36.0)
MCV: 88.9 fL (ref 78.0–100.0)
RDW: 13.1 % (ref 11.5–15.5)
WBC: 5.3 10*3/uL (ref 4.0–10.5)

## 2013-04-13 LAB — POCT I-STAT TROPONIN I: Troponin i, poc: 0.08 ng/mL (ref 0.00–0.08)

## 2013-04-13 MED ORDER — FUROSEMIDE 40 MG PO TABS: 40.0000 mg | ORAL_TABLET | Freq: Two times a day (BID) | ORAL | Status: DC

## 2013-04-13 MED ORDER — LISINOPRIL 20 MG PO TABS: 20.0000 mg | ORAL_TABLET | Freq: Every day | ORAL | Status: DC

## 2013-04-13 MED ORDER — HYDRALAZINE HCL 100 MG PO TABS: 50.0000 mg | ORAL_TABLET | Freq: Three times a day (TID) | ORAL | Status: DC

## 2013-04-13 MED ORDER — CARVEDILOL 12.5 MG PO TABS: 12.5000 mg | ORAL_TABLET | Freq: Two times a day (BID) | ORAL | Status: DC

## 2013-04-13 MED ORDER — PRAVASTATIN SODIUM 40 MG PO TABS: 40.0000 mg | ORAL_TABLET | Freq: Every day | ORAL | Status: DC

## 2013-04-13 MED ORDER — ISOSORBIDE MONONITRATE ER 30 MG PO TB24: 30.0000 mg | ORAL_TABLET | Freq: Every day | ORAL | Status: DC

## 2013-04-13 MED ORDER — SPIRONOLACTONE 25 MG PO TABS: 25.0000 mg | ORAL_TABLET | Freq: Every day | ORAL | Status: DC

## 2013-04-13 MED ORDER — ASPIRIN EC 81 MG PO TBEC: 81.0000 mg | DELAYED_RELEASE_TABLET | Freq: Every day | ORAL | Status: DC

## 2013-04-13 NOTE — Care Management Note (Addendum)
  Page 1 of 1   04/13/2013     3:37:19 PM   CARE MANAGEMENT NOTE 04/13/2013  Patient:  [facility]   Account Number:  [id number]  Date Initiated:  04/13/2013  Documentation initiated by:  Andreana Klingerman  Subjective/Objective Assessment:     Action/Plan:   Anticipated DC Date:     Anticipated DC Plan:        DC Planning Services  MATCH Program  Follow-up appt scheduled      Choice offered to / List presented to:             Status of service:   Medicare Important Message given?   (If response is "NO", the following Medicare IM given date fields will be blank) Date Medicare IM given:   Date Additional Medicare IM given:    Discharge Disposition:    Per UR Regulation:    If discussed at Long Length of Stay Meetings, dates discussed:    Comments:  Medication assistance provided using the [facility] program. The patient understands this is a once per calandar year assistance. The patient verbalized not being able to pay the $3 copay per medication.  I have assisted him through [facility] for this as well.  The patient has been seen in the heart failure clinic in the past and has an appointment for Thursday July 17th at 11:20.  The patient verbalized the importance of keeping this appointment.  04/13/13 @ 1700   Patients Medicaid is valid until the end of August 2014.  The patient was instructed which pharmacy could help with this copay.

## 2013-04-13 NOTE — ED Provider Notes (Signed)
History    CSN: [id number] Arrival date & time 04/13/13  1130  First MD Initiated Contact with Patient 04/13/13 1145     Chief Complaint  Patient presents with  . Chest Pain   (Consider location/radiation/quality/duration/timing/severity/associated sxs/prior Treatment) HPI Comments: [name] is a 57 y.o. Male evaluation of syncope. The syncope occurred 10 days ago. It happened in the evening while he was at a party. He had a preceding sensation of dizziness. He thinks he was out for a long period. He did not injure himself. Since that time . He is able to do his usual activities.. he has ongoing chronic daily chest pain, that lasts all day long. It feels like "a knot." He's been evaluated by his cardiologist and his PCP, for the problem. Earlier this year. He was evaluated for CT of the chest and abdomen to evaluate for aortic dissection. It was negative. He has cardiomyopathy, but hasn't improved. Ejection fraction to nearly normal on his last cardiac echo. Get cardiac catheterization within the last 2 years, that was reassuring, and did not require any treatment. He stopped taking all of his medicines, except sublingual nitroglycerin, 2 months ago, when he ran out of them. He does not have the financial ability to get them. He does not currently have any insurance. He denies headache, neck pain, back pain, shortness of breath, dizziness, paresthesias, nausea, vomiting, anorexia, change in bowel or urinary habits, at this time. There no other known modifying factors.  Patient is a 57 y.o. male presenting with chest pain. The history is provided by the patient.  Chest Pain  Past Medical History  Diagnosis Date  . Hypertension   . Psoriasis     Possible psoriasis in the past  . Alcohol abuse    Past Surgical History  Procedure Laterality Date  . Skin graft      L arm got caught in machine in 1979   Family History  Problem Relation Age of Onset  . Heart failure Father   . Heart  failure Other     Uncle. Possible MI?   History  Substance Use Topics  . Smoking status: Current Some Day Smoker    Types: Cigars  . Smokeless tobacco: Not on file     Comment: Rare cigar (black-n-milds)  . Alcohol Use: Yes     Comment: 3-4 12oz beers per day    Review of Systems  Cardiovascular: Positive for chest pain.  All other systems reviewed and are negative.    Allergies  Review of patient's allergies indicates no known allergies.  Home Medications   Current Outpatient Rx  Name  Route  Sig  Dispense  Refill  . nitroGLYCERIN (NITROSTAT) 0.4 MG SL tablet   Sublingual   Place 1 tablet (0.4 mg total) under the tongue every 5 (five) minutes as needed for chest pain.   30 tablet   3   . aspirin EC 81 MG tablet   Oral   Take 1 tablet (81 mg total) by mouth daily.   34 tablet   0   . carvedilol (COREG) 12.5 MG tablet   Oral   Take 1 tablet (12.5 mg total) by mouth 2 (two) times daily with a meal.   68 tablet   0   . furosemide (LASIX) 40 MG tablet   Oral   Take 1 tablet (40 mg total) by mouth 2 (two) times daily.   68 tablet   0   . hydrALAZINE (APRESOLINE) 100 MG  tablet   Oral   Take 0.5 tablets (50 mg total) by mouth 3 (three) times daily.   102 tablet   0   . isosorbide mononitrate (IMDUR) 30 MG 24 hr tablet   Oral   Take 1 tablet (30 mg total) by mouth daily.   34 tablet   0   . lisinopril (PRINIVIL,ZESTRIL) 20 MG tablet   Oral   Take 1 tablet (20 mg total) by mouth daily.   34 tablet   0   . pravastatin (PRAVACHOL) 40 MG tablet   Oral   Take 1 tablet (40 mg total) by mouth daily.   34 tablet   0   . spironolactone (ALDACTONE) 25 MG tablet   Oral   Take 1 tablet (25 mg total) by mouth daily.   34 tablet   0    BP 191/101  Pulse 73  Temp(Src) 98.3 F (36.8 C) (Oral)  Resp 21  SpO2 98% Physical Exam  Nursing note and vitals reviewed. Constitutional: He is oriented to person, place, and time. He appears well-developed and  well-nourished.  HENT:  Head: Normocephalic and atraumatic.  Right Ear: External ear normal.  Left Ear: External ear normal.  Eyes: Conjunctivae and EOM are normal. Pupils are equal, round, and reactive to light.  Neck: Normal range of motion and phonation normal. Neck supple.  Cardiovascular: Normal rate, regular rhythm, normal heart sounds and intact distal pulses.   Pulmonary/Chest: Effort normal and breath sounds normal. He exhibits no bony tenderness.  Abdominal: Soft. Normal appearance. There is no tenderness.  Musculoskeletal: Normal range of motion.  Neurological: He is alert and oriented to person, place, and time. He has normal strength. No cranial nerve deficit or sensory deficit. He exhibits normal muscle tone. Coordination normal.  Skin: Skin is warm, dry and intact.  Psychiatric: His behavior is normal. Judgment and thought content normal.  anxious    ED Course  Procedures (including critical care time)     _________________________________________________________________________ Prior Prescribed Medications- Not taking now except NTG SL: Current Outpatient Prescriptions   Medication  Sig  Dispense  Refill   .  aspirin 81 MG EC tablet  Take 1 tablet (81 mg total) by mouth daily.  30 tablet  6   .  carvedilol (COREG) 12.5 MG tablet  Take 1 tablet (12.5 mg total) by mouth 2 (two) times daily with a meal.  60 tablet  2   .  furosemide (LASIX) 40 MG tablet  Take 1 tablet (40 mg total) by mouth 2 (two) times daily.  60 tablet  2   .  hydrALAZINE (APRESOLINE) 50 MG tablet  TAKE 1 TABLET BY MOUTH 3 TIMES DAILY  102 tablet  PRN   .  isosorbide mononitrate (IMDUR) 30 MG 24 hr tablet  Take 1 tablet (30 mg total) by mouth daily.  30 tablet  0   .  lisinopril (PRINIVIL,ZESTRIL) 20 MG tablet  TAKE 1 TABLET BY MOUTH DAILY  34 tablet  PRN   .  pravastatin (PRAVACHOL) 40 MG tablet  Take 1 tablet (40 mg total) by mouth daily.  30 tablet  6   .  spironolactone (ALDACTONE) 25 MG tablet   Take 1 tablet (25 mg total) by mouth daily.  30 tablet  0   .  hydrocortisone 1 % lotion  Apply topically 2 (two) times daily.  118 mL  3   .  lansoprazole (PREVACID) 15 MG capsule  Take 15 mg by mouth daily.     Marland Kitchen  nitroGLYCERIN (NITROSTAT) 0.4 MG SL tablet  Place 1 tablet (0.4 mg total) under the tongue every 5 (five) minutes as needed for chest pain.  30 tablet  3   .  triamcinolone ointment (KENALOG) 0.5 %  Apply topically 2 (two) times daily. Do not use on the face.  30 g  0   ______________________________________________________________________________  1550- care management arranged for the patient to refill his cardiac and CHF Medications. They also arranged follow up in the congestive heart failure clinic in 2 days.    Date: 04/13/13  Rate: 83  Rhythm: normal sinus rhythm  QRS Axis: normal  PR and QT Intervals: normal  ST/T Wave abnormalities: normal  PR and QRS Conduction Disutrbances:none  Narrative Interpretation:   Old EKG Reviewed: unchanged- 05/20/12    Labs Reviewed  BASIC METABOLIC PANEL - Abnormal; Notable for the following:    Creatinine, Ser 1.46 (*)    GFR calc non Af Amer 51 (*)    GFR calc Af Amer 59 (*)    All other components within normal limits  PRO B NATRIURETIC PEPTIDE - Abnormal; Notable for the following:    Pro B Natriuretic peptide (BNP) 321.3 (*)    All other components within normal limits  CBC  POCT I-STAT TROPONIN I  POCT I-STAT TROPONIN I   Dg Chest 2 View  04/13/2013   *RADIOLOGY REPORT*  Clinical Data: Right sided chest pain  CHEST - 2 VIEW  Comparison: May 20, 2012  Findings: There is no focal infiltrate, pulmonary edema, or pleural effusion.  The aorta is tortuous.  The heart size is normal.  There is chronic elevation of the left hemidiaphragm unchanged.  The soft tissue and osseous structures are stable.  IMPRESSION: No acute cardiopulmonary disease identified.   Original Report Authenticated By: Sherian Rein, M.D.   1. Hypertension    2. Chest pain     MDM  Attention was medication, noncompliance. ACS, PE, or pneumonia.  Doubt metabolic instability, serious bacterial infection or impending vascular collapse; the patient is stable for discharge.   Nursing Notes Reviewed/ Care Coordinated, and agree without changes. Applicable Imaging Reviewed.  Interpretation of Laboratory Data incorporated into ED treatment    Plan: Home Medications- refills on medication; with arrangements for him to pick them up, at no cost to him; Home Treatments and Observation- recuperate at home and restart medications; return here if the recommended treatment, does not improve the symptoms; Recommended follow up- to followup in congestive heart failure clinic at 11:20 AM on 04/15/13    Flint Melter, MD 04/13/13 [phone]

## 2013-04-13 NOTE — Telephone Encounter (Signed)
error 

## 2013-04-13 NOTE — ED Notes (Signed)
Per pt sts he has been having intermittent chest pain since July 5th and SOB. sts feels like a knot inside of his chest that is squeezing. Hx of CHF.

## 2013-04-13 NOTE — Telephone Encounter (Signed)
Pt reports" that 2 or 3 times in the past 3-4 months that he wakes up and people are helping him off the ground and there is a golf ball in his heart, if I don't move around I'm going to pass out."   Recommended that pt go directly to ED for further evaluation of chest pain and syncopal episodes. Pt verbalized understanding. Wyatt Haste, RN-BSN

## 2013-04-14 ENCOUNTER — Other Ambulatory Visit: Payer: Self-pay | Admitting: Family Medicine

## 2013-04-15 ENCOUNTER — Ambulatory Visit (HOSPITAL_COMMUNITY)
Admit: 2013-04-15 | Discharge: 2013-04-15 | Disposition: A | Discharge status: Home / Self Care | Payer: Medicaid Other | Source: Ambulatory Visit | Attending: Cardiology | Admitting: Cardiology

## 2013-04-15 ENCOUNTER — Encounter (HOSPITAL_COMMUNITY): Payer: Self-pay

## 2013-04-15 VITALS — BP 128/76 | HR 68 | Wt 196.4 lb

## 2013-04-15 DIAGNOSIS — I5022 Chronic systolic (congestive) heart failure: Secondary | ICD-10-CM

## 2013-04-15 DIAGNOSIS — R0789 Other chest pain: Secondary | ICD-10-CM | POA: Insufficient documentation

## 2013-04-15 DIAGNOSIS — R55 Syncope and collapse: Secondary | ICD-10-CM | POA: Insufficient documentation

## 2013-04-15 DIAGNOSIS — R911 Solitary pulmonary nodule: Secondary | ICD-10-CM | POA: Insufficient documentation

## 2013-04-15 DIAGNOSIS — I10 Essential (primary) hypertension: Secondary | ICD-10-CM | POA: Insufficient documentation

## 2013-04-15 DIAGNOSIS — R079 Chest pain, unspecified: Secondary | ICD-10-CM

## 2013-04-15 MED ORDER — FUROSEMIDE 40 MG PO TABS: 40.0000 mg | ORAL_TABLET | Freq: Every day | ORAL | Status: DC

## 2013-04-15 MED ORDER — ISOSORBIDE MONONITRATE ER 60 MG PO TB24: 60.0000 mg | ORAL_TABLET | Freq: Every day | ORAL | Status: DC

## 2013-04-15 NOTE — Patient Instructions (Addendum)
Decrease Furosemide to 40 mg daily  Increase Isosorbide to 60 mg daily  Your physician has recommended that you wear an event monitor. Event monitors are medical devices that record the heart's electrical activity. Doctors most often [place] these monitors to diagnose arrhythmias. Arrhythmias are problems with the speed or rhythm of the heartbeat. The monitor is a small, portable device. You can wear one while you do your normal daily activities. This is usually used to diagnose what is causing palpitations/syncope (passing out).  Your physician has requested that you have an echocardiogram. Echocardiography is a painless test that uses sound waves to create images of your heart. It provides your doctor with information about the size and shape of your heart and how well your heart's chambers and valves are working. This procedure takes approximately one hour. There are no restrictions for this procedure.  Your physician recommends that you schedule a follow-up appointment in: 1 month

## 2013-04-15 NOTE — Progress Notes (Signed)
Discussed w/Dr Shirlee Latch will arrange for patient to have a 30 day event monitor over a 24 or 48 hour holter in order to development a more comprehensive evaluation of his syncope

## 2013-04-15 NOTE — Progress Notes (Signed)
Patient ID: [name], male   DOB: July 06, 1956, 57 y.o.   MRN: [id number]   Weight Range  180  Baseline proBNP  3473    HPI: [name] is a 57 y.o. gentlemen with history of HTN, daily EtOH and previous diagnosis of biventricular heart failure with EF 15%. He has chronic atypical chest pain.  Admitted 04/2012 with progressive SOB, orthopnea. pBNP 3473. LFTs mildly elevated at 44/64 .   04/2012 Echo: LVEF 15%, diffuse hypokinesis. Grade 2 diastolic dysfunction. LA mildly dilated. RV moderately reduced. RA mildly dilated. PAPP 44 mmHg.  04/2012 Abd u/s: Small volume abdominal ascites. Cannot exclude mild cirrhosis.  04/2012 VQ scan: Very low probability for pulmonary embolism.   05/25/12 RHC/LHC  RA = 7  RV = 56/3/10  PA = 52/23 (34)  PCW = 17 v= 25  Fick cardiac output/index = 7.8/3.9  Thermo CO/CI = 6.1/3.0  PVR = 2.1 [place]  SVR = 1194  FA sat = 97%  PA sat = 79%, 80%  High SVC = 79%  EF 15%  Coronaries normal.  CPX 07/14/12: Peak VO2: 21.1 % predicted peak VO2: 65.1%, VE/VCO2 slope: 25.0, OUES: 2.27, Peak RER: 1.12, Ventilatory Threshold: 12.4 % predicted peak VO2: 38.3%.    Echo 08/06/12: LVEF 50%, grade 1 diastolic dysfunction.  RV recovered.  CT Chest/Abdomen 09/2012: normal with no dissection.   Follow up:  Was seen in the ED on 04/13/13 for syncope and CP with unremarkable evaluation.  His medications were restarted at that time. He had not been taking medications due to running out of them and no follow up. Reports that he has had a CP on the side of his L chest that he describes as a knot that is pushing down. It gets worse when he is ambulating then when sitting still. Rates 7/10 pain. Chest pain has been present nearly constantly for a couple of years at least.  Occasional SOB. Denies edema, weight gain or orthopnea. Taking nitrostat 1-2x day. Did not have money to get hydralazine refilled. Reports since February has had 3 syncopal episodes. He says it happens if he stands  still in one place too long. No tachypalpitations preceding syncope.  He will get lightheaded before passing out.  Drinks 1 ETOH beverage a day.   06/02/12 Potassium 4.8 Creatinine 1.8 06/15/12 Potassium 4.5 Creatinine 1.45 7/14 Potassium 4 Creatinine 1.46  ROS: All systems negative except as listed in HPI, PMH and Problem List.  Past Medical History  Diagnosis Date  . Hypertension   . Psoriasis     Possible psoriasis in the past  . Alcohol abuse     Current Outpatient Prescriptions  Medication Sig Dispense Refill  . aspirin EC 81 MG tablet Take 1 tablet (81 mg total) by mouth daily.  34 tablet  0  . carvedilol (COREG) 12.5 MG tablet Take 1 tablet (12.5 mg total) by mouth 2 (two) times daily with a meal.  68 tablet  0  . furosemide (LASIX) 40 MG tablet Take 1 tablet (40 mg total) by mouth 2 (two) times daily.  68 tablet  0  . hydrALAZINE (APRESOLINE) 100 MG tablet Take 0.5 tablets (50 mg total) by mouth 3 (three) times daily.  102 tablet  0  . isosorbide mononitrate (IMDUR) 30 MG 24 hr tablet Take 1 tablet (30 mg total) by mouth daily.  34 tablet  0  . lisinopril (PRINIVIL,ZESTRIL) 20 MG tablet Take 1 tablet (20 mg total) by mouth daily.  34 tablet  0  . nitroGLYCERIN (NITROSTAT) 0.4 MG SL tablet Place 1 tablet (0.4 mg total) under the tongue every 5 (five) minutes as needed for chest pain.  30 tablet  3  . pravastatin (PRAVACHOL) 40 MG tablet Take 1 tablet (40 mg total) by mouth daily.  34 tablet  0  . spironolactone (ALDACTONE) 25 MG tablet Take 1 tablet (25 mg total) by mouth daily.  34 tablet  0   No current facility-administered medications for this encounter.     PHYSICAL EXAM: Filed Vitals:   04/15/13 1114  BP: 120/68  Pulse: 68  Weight: 196 lb 6.4 oz (89.086 kg)  SpO2: 97%    General:  No resp difficulty; daughters present HEENT: normal Neck: supple. JVP flat. Carotids 2+ bilaterally; no bruits. No lymphadenopathy or thryomegaly appreciated. Cor: PMI normal. Regular  rate & rhythm. No rubs, gallops, murmurs.  Lungs: clear Abdomen: soft, nontender, nondistended. No hepatosplenomegaly. No bruits or masses. Good bowel sounds. Aortic impulse not widened or overly prominent Extremities: no cyanosis, clubbing, rash, edema. Multiple areas of vitiligo and skin lesions Neuro: alert & orientedx3, cranial nerves grossly intact. Moves all 4 extremities w/o difficulty. Affect pleasant. MSK: Chest wall is not tender   ASSESSMENT & PLAN: 1. Chest pain: Chronic, constant left lateral chest pain like a "knot."  Chest wall is not tender.  He has had extensive workup of this.  Normal coronaries 8/13, normal V/Q scan, no evidence for dissection on CT chest/abdomen in 1/14.  Cardiac enzymes negative when recently in the ER.  He is not clear whether or not nitrates help.  If this is microvascular angina increasing his Imdur may help.  However, my suspicion is that this is noncardiac.  - Continue ASA 81 - I will increase his Imdur to 60 mg daily to see if this helps any.  2. Syncope: Occurs after prolonged standing.  Has happened 3 times since February. He is not very clear with history.  No tachypalpitations.  Given history of cardiomyopathy, I will get an echocardiogram and will also have him wear a 30 day monitor.  ?vasovagal => he should avoid prolonged standing in one place.  3. HTN: Reasonable control since restarting his meds.  4. Cardiomyopathy: Thought to be secondary to ETOH + HTN.  He does not drink heavily like he did in the past.  Last echo in 11/13 showed EF improved to 50%.  As above, I am going to repeat an echo.  He will continue Coreg, lisinopril, spironolactone, hydralazine/Imdur (refill hydralazine today).  K was stable recently.  Given spironolactone use, will need more regular BMET.  He does not appear significantly volume overloaded and had been off Lasix for a number of weeks until it was restarted earlier this week.  I do not think he needs 40 mg bid.  I will  decrease Lasix to 40 mg daily.  5. Pulmonary nodule: Small, noted on CT chest in 1/14.  Needs repeat noncontrast chest CT in 1/15 to follow.   Marca Ancona 04/16/2013

## 2013-04-16 DIAGNOSIS — R079 Chest pain, unspecified: Secondary | ICD-10-CM | POA: Insufficient documentation

## 2013-04-20 ENCOUNTER — Encounter: Payer: Self-pay | Admitting: *Deleted

## 2013-04-20 ENCOUNTER — Encounter (INDEPENDENT_AMBULATORY_CARE_PROVIDER_SITE_OTHER): Payer: Medicaid Other

## 2013-04-20 ENCOUNTER — Ambulatory Visit (HOSPITAL_COMMUNITY)
Admission: RE | Admit: 2013-04-20 | Discharge: 2013-04-20 | Disposition: A | Discharge status: Home / Self Care | Payer: Medicaid Other | Source: Ambulatory Visit | Attending: Family Medicine | Admitting: Family Medicine

## 2013-04-20 DIAGNOSIS — R209 Unspecified disturbances of skin sensation: Secondary | ICD-10-CM | POA: Insufficient documentation

## 2013-04-20 DIAGNOSIS — R55 Syncope and collapse: Secondary | ICD-10-CM

## 2013-04-20 DIAGNOSIS — I10 Essential (primary) hypertension: Secondary | ICD-10-CM | POA: Insufficient documentation

## 2013-04-20 DIAGNOSIS — I509 Heart failure, unspecified: Secondary | ICD-10-CM | POA: Insufficient documentation

## 2013-04-20 DIAGNOSIS — I5022 Chronic systolic (congestive) heart failure: Secondary | ICD-10-CM

## 2013-04-20 NOTE — Progress Notes (Signed)
Patient ID: [name], male   DOB: [birth year], 57 y.o.   MRN: [id number] [employer] ACT 30 day event monitor applied to patient.  Patient to call [employer] from land line phone today to verify baseline event sent from office.

## 2013-04-26 ENCOUNTER — Telehealth: Payer: Self-pay | Admitting: Family Medicine

## 2013-04-26 DIAGNOSIS — Z299 Encounter for prophylactic measures, unspecified: Secondary | ICD-10-CM

## 2013-04-26 NOTE — Telephone Encounter (Signed)
Patient needs a referral for a Colonoscopy before the end of August which is when his Medicaid will end.

## 2013-04-28 ENCOUNTER — Telehealth (HOSPITAL_COMMUNITY): Payer: Self-pay | Admitting: Adult Health

## 2013-04-28 NOTE — Telephone Encounter (Signed)
I have placed a referral to GI today. I am not certain they will be able to get him in that quickly though. Thanks.

## 2013-04-28 NOTE — Telephone Encounter (Signed)
Message copied by Tonye Becket D on Wed Apr 28, 2013  4:04 PM ------      Message from: Laurey Morale      Created: Sun Apr 25, 2013  9:39 PM       EF stable at 50-55%. ------

## 2013-04-28 NOTE — Telephone Encounter (Signed)
Provided with ECHO results.   EF 50-55%.   Mr Sondgeroth verbalized understanding.   Jordie Schreur 4:06 PM

## 2013-04-29 NOTE — Telephone Encounter (Signed)
Placed in [employer]

## 2013-04-30 ENCOUNTER — Encounter: Payer: Self-pay | Admitting: Internal Medicine

## 2013-05-26 ENCOUNTER — Ambulatory Visit (HOSPITAL_COMMUNITY)
Admission: RE | Admit: 2013-05-26 | Discharge: 2013-05-26 | Disposition: A | Discharge status: Home / Self Care | Payer: Medicaid Other | Source: Ambulatory Visit | Attending: Internal Medicine | Admitting: Internal Medicine

## 2013-05-26 VITALS — BP 116/74 | HR 72 | Wt 204.5 lb

## 2013-05-26 DIAGNOSIS — R0989 Other specified symptoms and signs involving the circulatory and respiratory systems: Secondary | ICD-10-CM | POA: Insufficient documentation

## 2013-05-26 DIAGNOSIS — Z7982 Long term (current) use of aspirin: Secondary | ICD-10-CM | POA: Insufficient documentation

## 2013-05-26 DIAGNOSIS — I129 Hypertensive chronic kidney disease with stage 1 through stage 4 chronic kidney disease, or unspecified chronic kidney disease: Secondary | ICD-10-CM | POA: Insufficient documentation

## 2013-05-26 DIAGNOSIS — R0600 Dyspnea, unspecified: Secondary | ICD-10-CM

## 2013-05-26 DIAGNOSIS — Z79899 Other long term (current) drug therapy: Secondary | ICD-10-CM | POA: Insufficient documentation

## 2013-05-26 DIAGNOSIS — R0609 Other forms of dyspnea: Secondary | ICD-10-CM | POA: Insufficient documentation

## 2013-05-26 DIAGNOSIS — N183 Chronic kidney disease, stage 3 unspecified: Secondary | ICD-10-CM | POA: Insufficient documentation

## 2013-05-26 DIAGNOSIS — R55 Syncope and collapse: Secondary | ICD-10-CM

## 2013-05-26 DIAGNOSIS — I5022 Chronic systolic (congestive) heart failure: Secondary | ICD-10-CM | POA: Insufficient documentation

## 2013-05-26 NOTE — Progress Notes (Signed)
Patient ID: [name], male   DOB: [birth year], 57 y.o.   MRN: [id number]   Weight Range  180  Baseline proBNP  3473    HPI: [name] is a 57 y.o. gentlemen with history of HTN, daily EtOH and previous diagnosis of biventricular heart failure with EF 15%. He has chronic atypical chest pain.  Admitted 04/2012 with progressive SOB, orthopnea. pBNP 3473. LFTs mildly elevated at 44/64 .   04/2012 Echo: LVEF 15%, diffuse hypokinesis. Grade 2 diastolic dysfunction. LA mildly dilated. RV moderately reduced. RA mildly dilated. PAPP 44 mmHg.  04/2012 Abd u/s: Small volume abdominal ascites. Cannot exclude mild cirrhosis.  04/2012 VQ scan: Very low probability for pulmonary embolism.   05/25/12 RHC/LHC  RA = 7  RV = 56/3/10  PA = 52/23 (34)  PCW = 17 v= 25  Fick cardiac output/index = 7.8/3.9  Thermo CO/CI = 6.1/3.0  PVR = 2.1 Woods  SVR = 1194  FA sat = 97%  PA sat = 79%, 80%  High SVC = 79%  EF 15%  Coronaries normal.  CPX 07/14/12: Peak VO2: 21.57ml/kg/min  % predicted peak VO2: 65.1%, VE/VCO2 slope: 25.0, OUES: 2.27, Peak RER: 1.12, Ventilatory Threshold: 12.4 % predicted peak VO2: 38.3%.    Echo 08/06/12: LVEF 50%, grade 1 diastolic dysfunction.  RV recovered.  CT Chest/Abdomen 09/2012: normal with no dissection.  Echo 7/14: LVEF 50-55% Even monitor: lots of transmitted episodes. Tracing all NSR with no ectopy.   Follow up:  At last visit c/o syncopal episode. Wore monitor with NSR and no ectopy. Echo last month EF 50-55%. Says he feels horrible. Short of breath with walking just a few hundred feet. Occasional orthopnea. Weights himself only 1x/week. (doesn't have a scale at home). No edema. No furthersyncope.   06/02/12 Potassium 4.8 Creatinine 1.8 06/15/12 Potassium 4.5 Creatinine 1.45 7/14 Potassium 4 Creatinine 1.46  ROS: All systems negative except as listed in HPI, PMH and Problem List.  Past Medical History  Diagnosis Date  . Hypertension   . Psoriasis     Possible  psoriasis in the past  . Alcohol abuse     Current Outpatient Prescriptions  Medication Sig Dispense Refill  . aspirin EC 81 MG tablet Take 1 tablet (81 mg total) by mouth daily.  34 tablet  0  . carvedilol (COREG) 12.5 MG tablet Take 1 tablet (12.5 mg total) by mouth 2 (two) times daily with a meal.  68 tablet  0  . furosemide (LASIX) 40 MG tablet Take 1 tablet (40 mg total) by mouth daily.  68 tablet  0  . hydrALAZINE (APRESOLINE) 100 MG tablet Take 0.5 tablets (50 mg total) by mouth 3 (three) times daily.  102 tablet  0  . isosorbide mononitrate (IMDUR) 60 MG 24 hr tablet Take 1 tablet (60 mg total) by mouth daily.  34 tablet  0  . lisinopril (PRINIVIL,ZESTRIL) 20 MG tablet Take 1 tablet (20 mg total) by mouth daily.  34 tablet  0  . nitroGLYCERIN (NITROSTAT) 0.4 MG SL tablet Place 1 tablet (0.4 mg total) under the tongue every 5 (five) minutes as needed for chest pain.  30 tablet  3  . pravastatin (PRAVACHOL) 40 MG tablet Take 1 tablet (40 mg total) by mouth daily.  34 tablet  0  . spironolactone (ALDACTONE) 25 MG tablet Take 1 tablet (25 mg total) by mouth daily.  34 tablet  0  . triamcinolone ointment (KENALOG) 0.5 % APPLY TO AFFECTED AREA(S) TWICE DAILY.  DO NOT USE ON THE FACE.  45 g  0   No current facility-administered medications for this encounter.     PHYSICAL EXAM: Filed Vitals:   05/26/13 1311  BP: 116/74  Pulse: 72  Weight: 204 lb 8 oz (92.761 kg)  SpO2: 96%    General:  No resp difficulty;  HEENT: normal Neck: supple. JVP flat. Carotids 2+ bilaterally; no bruits. No lymphadenopathy or thryomegaly appreciated. Cor: PMI normal. Regular rate & rhythm. No rubs, gallops, murmurs.  Lungs: clear Abdomen: soft, nontender, nondistended. No hepatosplenomegaly. No bruits or masses. Good bowel sounds. Aortic impulse not widened or overly prominent Extremities: no cyanosis, clubbing, rash, edema. Multiple areas of vitiligo and skin lesions Neuro: alert & orientedx3, cranial  nerves grossly intact. Moves all 4 extremities w/o difficulty. Affect pleasant. MSK: Chest wall is not tender   ASSESSMENT & PLAN: 1. Dyspnea 2. Chronic systolic HF - EF now recovered 3. Recent syncope - monitor reviewed. It is normal. Suspect vaso-vagal 4. HTN - controlled 5. H/o ETOH use 6. Chronic renal failure, class III  I have reviewed all of his studies carefully today, including his echo which shows recovered LV function. On exam volume status looks good and there is no S3. Unfortunately, he continues to have NYHA III-IIIb dyspnea. I told him and his daughter that I am at a loss to explain why he feels so bad. They got mad at me as they felt I was not taking his symptoms seriously and they felt I thought he was just was asking for disability. I told them I was concerned about his dyspnea but i was just unable to explain why he felt so bad based on the information I had. We discussed the role of repeat CPX testing vs cath. We have decided to repeat his CPX next week for further evaluation. We will see them back in several weeks to discuss.   Reuel Boom Cedrik Heindl,MD 3:09 PM    Arvilla Meres 05/26/2013

## 2013-05-26 NOTE — Patient Instructions (Addendum)
Your physician has recommended that you have a cardiopulmonary stress test (CPX). CPX testing is a non-invasive measurement of heart and lung function. It replaces a traditional treadmill stress test. This type of test provides a tremendous amount of information that relates not only to your present condition but also for future outcomes. This test combines measurements of you ventilation, respiratory gas exchange in the lungs, electrocardiogram (EKG), blood pressure and physical response before, during, and following an exercise protocol.  Your physician recommends that you schedule a follow-up appointment in: 1 month  Do the following things EVERYDAY: 1) Weigh yourself in the morning before breakfast. Write it down and keep it in a log. 2) Take your medicines as prescribed 3) Eat low salt foods-Limit salt (sodium) to 2000 mg per day.  4) Stay as active as you can everyday 5) Limit all fluids for the day to less than 2 liters 6)

## 2013-05-29 DIAGNOSIS — R55 Syncope and collapse: Secondary | ICD-10-CM | POA: Insufficient documentation

## 2013-06-02 ENCOUNTER — Ambulatory Visit (HOSPITAL_COMMUNITY): Payer: Medicaid Other | Attending: Internal Medicine

## 2013-06-02 DIAGNOSIS — I5022 Chronic systolic (congestive) heart failure: Secondary | ICD-10-CM | POA: Insufficient documentation

## 2013-06-03 ENCOUNTER — Encounter (HOSPITAL_COMMUNITY): Payer: Medicaid Other

## 2013-06-29 ENCOUNTER — Ambulatory Visit (HOSPITAL_COMMUNITY): Payer: Medicaid Other

## 2013-07-14 ENCOUNTER — Encounter: Payer: Medicaid Other | Admitting: Internal Medicine

## 2013-07-16 ENCOUNTER — Other Ambulatory Visit: Payer: Self-pay | Admitting: Family Medicine

## 2013-07-19 ENCOUNTER — Telehealth: Payer: Self-pay | Admitting: Family Medicine

## 2013-07-19 ENCOUNTER — Other Ambulatory Visit: Payer: Self-pay | Admitting: Family Medicine

## 2013-07-19 NOTE — Telephone Encounter (Signed)
Refill request for Kenalog ointment. Patient would like to pick this up from pharmacy today.

## 2013-08-02 ENCOUNTER — Other Ambulatory Visit (HOSPITAL_COMMUNITY): Payer: Self-pay | Admitting: *Deleted

## 2013-08-02 MED ORDER — SPIRONOLACTONE 25 MG PO TABS: 25.0000 mg | ORAL_TABLET | Freq: Every day | ORAL | Status: DC

## 2013-08-02 MED ORDER — ASPIRIN EC 81 MG PO TBEC: 81.0000 mg | DELAYED_RELEASE_TABLET | Freq: Every day | ORAL | Status: DC

## 2013-08-02 MED ORDER — CARVEDILOL 12.5 MG PO TABS: 12.5000 mg | ORAL_TABLET | Freq: Two times a day (BID) | ORAL | Status: DC

## 2013-08-02 MED ORDER — PRAVASTATIN SODIUM 40 MG PO TABS: 40.0000 mg | ORAL_TABLET | Freq: Every day | ORAL | Status: DC

## 2013-08-02 MED ORDER — LISINOPRIL 20 MG PO TABS: 20.0000 mg | ORAL_TABLET | Freq: Every day | ORAL | Status: DC

## 2013-08-02 MED ORDER — ISOSORBIDE MONONITRATE ER 60 MG PO TB24: 60.0000 mg | ORAL_TABLET | Freq: Every day | ORAL | Status: DC

## 2013-09-01 ENCOUNTER — Telehealth: Payer: Self-pay | Admitting: Family Medicine

## 2013-09-01 ENCOUNTER — Other Ambulatory Visit: Payer: Self-pay | Admitting: Family Medicine

## 2013-09-01 MED ORDER — TRIAMCINOLONE ACETONIDE 0.5 % EX OINT: TOPICAL_OINTMENT | Freq: Two times a day (BID) | CUTANEOUS | Status: DC

## 2013-09-01 NOTE — Telephone Encounter (Signed)
Pt called and needs refills on his Kenalog, and nitrostat sent to his pharmacy. jw

## 2013-09-02 ENCOUNTER — Other Ambulatory Visit: Payer: Self-pay | Admitting: Family Medicine

## 2013-11-30 ENCOUNTER — Other Ambulatory Visit (HOSPITAL_COMMUNITY): Payer: Self-pay | Admitting: Internal Medicine

## 2013-12-30 ENCOUNTER — Other Ambulatory Visit: Payer: Self-pay | Admitting: Family Medicine

## 2014-01-28 ENCOUNTER — Other Ambulatory Visit: Payer: Self-pay | Admitting: Family Medicine

## 2014-03-26 ENCOUNTER — Other Ambulatory Visit (HOSPITAL_COMMUNITY): Payer: Self-pay | Admitting: Internal Medicine

## 2014-03-26 ENCOUNTER — Other Ambulatory Visit: Payer: Self-pay | Admitting: Family Medicine

## 2014-04-01 ENCOUNTER — Other Ambulatory Visit: Payer: Self-pay | Admitting: Family Medicine

## 2014-05-06 ENCOUNTER — Ambulatory Visit (INDEPENDENT_AMBULATORY_CARE_PROVIDER_SITE_OTHER): Payer: Medicaid Other | Admitting: Family Medicine

## 2014-05-06 ENCOUNTER — Encounter: Payer: Self-pay | Admitting: Family Medicine

## 2014-05-06 VITALS — BP 176/99 | HR 83 | Temp 98.3°F | Ht 71.0 in | Wt 202.4 lb

## 2014-05-06 DIAGNOSIS — I5022 Chronic systolic (congestive) heart failure: Secondary | ICD-10-CM

## 2014-05-06 DIAGNOSIS — K089 Disorder of teeth and supporting structures, unspecified: Secondary | ICD-10-CM

## 2014-05-06 DIAGNOSIS — I10 Essential (primary) hypertension: Secondary | ICD-10-CM

## 2014-05-06 NOTE — Progress Notes (Signed)
Patient ID: [name], male   DOB: [birth year], 58 y.o.   MRN: [id number]     Subjective  [name] is a 58 y.o. male that presents for a form to be filled for his dentist.  1. Hypertension: currently adherent to medication regimen of carvedilol, hydralazine and lisinopril  2. Hx of CHF: Patient currently stable with no increased dyspnea. Compliant with regimen.    3. Poor dentition: Patient states he has neglected his health because he has been putting his children first. Now he has time to work on himself. Most of his teeth are out. He has gone for an evaluation for what seems to be dental implants. No fevers, chills. No pain or swelling of gums  History  Substance Use Topics  . Smoking status: Current Some Day Smoker    Types: Cigars  . Smokeless tobacco: Not on file     Comment: Rare cigar (black-n-milds)  . Alcohol Use: Yes     Comment: 3-4 12oz beers per day   ROS Per HPI  Objective  BP 176/99  Pulse 83  Temp(Src) 98.3 F (36.8 C) (Oral)  Ht 5\' 11"  (1.803 m)  Wt 202 lb 6.4 oz (91.808 kg)  BMI 28.24 kg/m2  Gen: comfortable in chair HEENT: Oral cavity moist, dentition is poor with most teeth missing. Lungs: clear to auscultation Heart: regular rate and rhythm  Assessment and Plan   Please refer to problem based charting of assessment and plan

## 2014-05-06 NOTE — Patient Instructions (Signed)
[name], it was a pleasure seeing you today. Today we talked about your medical form for your upcoming dental surgery. I will have this form filled out and faxed to the dentist's office.   Please schedule an appointment to see your PCP, Dr. Raoul [name] for regular follow-up.  If you have any questions or concerns, please do not hesitate to call the office at [phone].  Sincerely,  Cordelia Poche, MD

## 2014-05-08 DIAGNOSIS — K089 Disorder of teeth and supporting structures, unspecified: Secondary | ICD-10-CM | POA: Insufficient documentation

## 2014-05-08 NOTE — Assessment & Plan Note (Signed)
Appears to be resolved. Per cardiology, keep medication regimen to control blood pressure.

## 2014-05-08 NOTE — Assessment & Plan Note (Signed)
Filled out patient's form and faxed information to patient's Dentist's office.

## 2014-05-08 NOTE — Assessment & Plan Note (Signed)
Currently elevated today. No symptoms. Will follow-up with PCP.

## 2014-05-28 ENCOUNTER — Other Ambulatory Visit (HOSPITAL_COMMUNITY): Payer: Self-pay | Admitting: Internal Medicine

## 2014-06-14 ENCOUNTER — Ambulatory Visit: Payer: Medicaid Other | Admitting: Family Medicine

## 2014-09-08 ENCOUNTER — Encounter (HOSPITAL_COMMUNITY): Payer: Self-pay | Admitting: Internal Medicine

## 2014-09-14 ENCOUNTER — Other Ambulatory Visit: Payer: Self-pay | Admitting: *Deleted

## 2014-09-15 MED ORDER — TRIAMCINOLONE ACETONIDE 0.5 % EX OINT: TOPICAL_OINTMENT | CUTANEOUS | Status: DC

## 2014-12-29 ENCOUNTER — Other Ambulatory Visit (HOSPITAL_COMMUNITY): Payer: Self-pay | Admitting: *Deleted

## 2014-12-29 ENCOUNTER — Encounter (HOSPITAL_COMMUNITY): Payer: Self-pay | Admitting: *Deleted

## 2014-12-29 ENCOUNTER — Other Ambulatory Visit: Payer: Self-pay | Admitting: Family Medicine

## 2014-12-29 MED ORDER — LISINOPRIL 20 MG PO TABS: 20.0000 mg | ORAL_TABLET | Freq: Every day | ORAL | Status: DC

## 2015-03-10 ENCOUNTER — Other Ambulatory Visit: Payer: Self-pay | Admitting: Family Medicine

## 2015-10-02 MED FILL — TRIAMCINOLONE 0.5% OINTMENT: 0.5 | 15 days supply | Qty: 45 | Fill #7

## 2015-11-24 MED FILL — TRIAMCINOLONE 0.5% OINTMENT: 0.5 | 15 days supply | Qty: 45 | Fill #8

## 2015-12-08 ENCOUNTER — Ambulatory Visit (HOSPITAL_COMMUNITY)
Admission: RE | Admit: 2015-12-08 | Discharge: 2015-12-08 | Disposition: A | Discharge status: Home / Self Care | Payer: Medicare Other | Source: Ambulatory Visit | Attending: Family Medicine | Admitting: Family Medicine

## 2015-12-08 ENCOUNTER — Ambulatory Visit (INDEPENDENT_AMBULATORY_CARE_PROVIDER_SITE_OTHER): Payer: Medicare Other | Admitting: Family Medicine

## 2015-12-08 ENCOUNTER — Encounter: Payer: Self-pay | Admitting: Family Medicine

## 2015-12-08 VITALS — BP 186/116 | Temp 98.1°F | Wt 193.2 lb

## 2015-12-08 DIAGNOSIS — Z1159 Encounter for screening for other viral diseases: Secondary | ICD-10-CM

## 2015-12-08 DIAGNOSIS — I517 Cardiomegaly: Secondary | ICD-10-CM | POA: Diagnosis not present

## 2015-12-08 DIAGNOSIS — Z114 Encounter for screening for human immunodeficiency virus [HIV]: Secondary | ICD-10-CM

## 2015-12-08 DIAGNOSIS — R911 Solitary pulmonary nodule: Secondary | ICD-10-CM

## 2015-12-08 DIAGNOSIS — I5022 Chronic systolic (congestive) heart failure: Secondary | ICD-10-CM

## 2015-12-08 DIAGNOSIS — R079 Chest pain, unspecified: Secondary | ICD-10-CM | POA: Diagnosis not present

## 2015-12-08 DIAGNOSIS — I10 Essential (primary) hypertension: Secondary | ICD-10-CM

## 2015-12-08 LAB — LIPID PANEL
CHOL/HDL RATIO: 4.8 ratio (ref <=5.0)
Cholesterol: 148 mg/dL (ref 125–200)
HDL: 31 mg/dL — AB (ref >=40)
LDL Cholesterol: 83 mg/dL (ref <130)
TRIGLYCERIDES: 168 mg/dL — AB (ref <150)
VLDL: 34 mg/dL — ABNORMAL HIGH (ref <30)

## 2015-12-08 LAB — CBC
HCT: 40.7 % (ref 39.0–52.0)
Hemoglobin: 14 g/dL (ref 13.0–17.0)
MCH: 28.9 pg (ref 26.0–34.0)
MCHC: 34.4 g/dL (ref 30.0–36.0)
MCV: 83.9 fL (ref 78.0–100.0)
MPV: 10.4 fL (ref 8.6–12.4)
PLATELETS: 192 10*3/uL (ref 150–400)
RBC: 4.85 MIL/uL (ref 4.22–5.81)
RDW: 14.6 % (ref 11.5–15.5)
WBC: 3.4 10*3/uL — AB (ref 4.0–10.5)

## 2015-12-08 LAB — BASIC METABOLIC PANEL WITH GFR
BUN: 15 mg/dL (ref 7–25)
CALCIUM: 8.5 mg/dL — AB (ref 8.6–10.3)
CHLORIDE: 100 mmol/L (ref 98–110)
CO2: 27 mmol/L (ref 20–31)
Creat: 1.16 mg/dL (ref 0.70–1.25)
GFR, EST NON AFRICAN AMERICAN: 68 mL/min (ref >=60)
GFR, Est African American: 78 mL/min (ref >=60)
Glucose, Bld: 91 mg/dL (ref 65–99)
Potassium: 3.6 mmol/L (ref 3.5–5.3)
SODIUM: 139 mmol/L (ref 135–146)

## 2015-12-08 MED ORDER — NITROGLYCERIN 0.4 MG SL SUBL: SUBLINGUAL_TABLET | SUBLINGUAL | Status: DC

## 2015-12-08 MED ORDER — ASPIRIN 81 MG PO TBEC: DELAYED_RELEASE_TABLET | ORAL | Status: DC

## 2015-12-08 MED ORDER — CARVEDILOL 12.5 MG PO TABS: 12.5000 mg | ORAL_TABLET | Freq: Two times a day (BID) | ORAL | Status: DC

## 2015-12-08 MED ORDER — FUROSEMIDE 40 MG PO TABS: 40.0000 mg | ORAL_TABLET | Freq: Two times a day (BID) | ORAL | Status: DC

## 2015-12-08 MED FILL — CARVEDILOL 12.5 MG TABLET: 12.5 | 30 days supply | Qty: 60 | Fill #0

## 2015-12-08 MED FILL — NITROGLYCERIN 0.4 MG TAB SL: 0.4 | 13 days supply | Qty: 25 | Fill #0

## 2015-12-08 MED FILL — FUROSEMIDE 40 MG TABLET: 40 | 34 days supply | Qty: 68 | Fill #0

## 2015-12-08 NOTE — Patient Instructions (Signed)
Thank you for coming in,   I am restarting some of your medications today.  If you experience chest pain that is crushing in nature, associated with nausea or vomiting then please seek immediate care at the emergency department.  I have hoping that some of the medications that we are starting today with help with her cough.  Please follow-up on Monday with someone in our clinic. I am not inclined except please schedule somewhat else.  Please bring all of your medications with you to each visit.   Sign up for My Chart to have easy access to your labs results, and communication with your Primary care physician   Please feel free to call with any questions or concerns at any time, at [phone]. --Dr. Raeford Razor

## 2015-12-08 NOTE — Progress Notes (Signed)
Subjective:    [name] - 60 y.o. male MRN [id number]  Date of birth: January 10, 1956  CC HTN,   HPI  [name] is here for HTN.   HTN Disease Monitoring: Home BP Monitoring none  Medications:coreg, lasix, Imdur, lisinopril, spironolactone  Chest pain- having a squeezing sensation in his left chest.      Dyspnea- yes and getting worse.  Compliance-  No adherent .  Lightheadedness-  no  Edema- no  Chest pain:  Feels this squeezing sensation.  This is constant in duration.  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  This has been ongoing for several months.  No nausea, vomiting or diaphoresis  Reports that it feels like his heart wants to stop  Doesn't report any palpitations.  Unsure of how longs it lasts  Doesn't specify a trigger.  These symptoms are the same thing he was experiencing when his cardiologist before.   Chronic systolic HF Having some orthopnea and no PND  He has increase the number of pillows behind his bed.  He sleeps propped up on the couch.  Denies any leg swelling or dyspnea    CT findings 2014 Indeterminate 4-mm nodule within the right lower lobe. If the patient is at high risk for bronchogenic carcinoma, follow-up chest CT at 1 year is recommended. If the patient is at low risk, no follow-up is needed. He has a history of drinking beer everyday  He would drink 4-5 beers per day.  He quit 3-4 years ago.   SH: smoked in the 70's for two years. Smoke black and milds for one year. drank alcohol for a long time   PMH: HFrEF, HTN, CKD III,   Health Maintenance:  - Hep C today   - HIV today  Health Maintenance Due  Topic Date Due  . HIV Screening  03/29/1969  . TETANUS/TDAP  03/29/1973  . COLONOSCOPY  03/29/2004  . ZOSTAVAX  03/29/2014    Review of Systems See HPI     Objective:   Physical Exam BP 186/116 mmHg  Temp(Src) 98.1 F  (36.7 C) (Oral)  Wt 193 lb 3.2 oz (87.635 kg) Gen: NAD, alert, cooperative with exam,  CV: RRR, good S1/S2, no murmur, no edema,  Resp: CTABL, no wheezes, non-labored  Neuro: no gross deficits.     Assessment & Plan:   Hypertension Uncontrolled  Non adherent with medications.  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  EKG with LVH and comparable to last  He is having a dry cough as well which could be associated with his HF  - restart coreg, lasix today  - have his follow up on Monday of next week. May need to get a chest x-ray if still having cough and no improvement with the lasix   - has seen Dr. Tempie Hoist and probably needs a referral back to cardiology  - may need ECHO soon as well  - discussed with Dr. Ree Kida   Chest pain Reports his chest pain is chronic and has not changed recently  Reports he quit taking his medications and following up since he lost faith in some of his doctors as they were not able to help him feel better.  EKG comparable to last  - encouraged to take his ASA daily  - refilled his nitro  - given indications for going to ED   Pulmonary nodule, right Has not had follow up CT scan for his finding  May be associated with his new dyspnea  - needs a CT scan in the future once he gets his cardiovascular status in check

## 2015-12-09 LAB — TSH: TSH: 1.26 m[IU]/L (ref 0.40–4.50)

## 2015-12-09 LAB — HEPATITIS C ANTIBODY: HCV Ab: NEGATIVE

## 2015-12-10 NOTE — Assessment & Plan Note (Addendum)
Reports his chest pain is chronic and has not changed recently  Reports he quit taking his medications and following up since he lost faith in some of his doctors as they were not able to help him feel better.  EKG comparable to last  - encouraged to take his ASA daily  - refilled his nitro  - given indications for going to ED

## 2015-12-10 NOTE — Assessment & Plan Note (Signed)
Has not had follow up CT scan for his finding  May be associated with his new dyspnea  - needs a CT scan in the future once he gets his cardiovascular status in check

## 2015-12-10 NOTE — Assessment & Plan Note (Signed)
Uncontrolled  Non adherent with medications.  - restart coreg and lasix today

## 2015-12-10 NOTE — Assessment & Plan Note (Addendum)
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  EKG with LVH and comparable to last  He is having a dry cough as well which could be associated with his HF  - restart coreg, lasix today  - have his follow up on Monday of next week. May need to get a chest x-ray if still having cough and no improvement with the lasix   - has seen Dr. Tempie Hoist and probably needs a referral back to cardiology  - may need ECHO soon as well  - discussed with Dr. Ree Kida

## 2015-12-11 ENCOUNTER — Ambulatory Visit (INDEPENDENT_AMBULATORY_CARE_PROVIDER_SITE_OTHER): Payer: Medicare Other | Admitting: Family Medicine

## 2015-12-11 ENCOUNTER — Encounter: Payer: Self-pay | Admitting: Family Medicine

## 2015-12-11 VITALS — BP 142/90 | HR 103 | Temp 98.6°F | Ht 71.0 in | Wt 198.6 lb

## 2015-12-11 DIAGNOSIS — I10 Essential (primary) hypertension: Secondary | ICD-10-CM

## 2015-12-11 DIAGNOSIS — I5022 Chronic systolic (congestive) heart failure: Secondary | ICD-10-CM | POA: Diagnosis present

## 2015-12-11 DIAGNOSIS — R079 Chest pain, unspecified: Secondary | ICD-10-CM

## 2015-12-11 DIAGNOSIS — I429 Cardiomyopathy, unspecified: Secondary | ICD-10-CM | POA: Diagnosis not present

## 2015-12-11 DIAGNOSIS — I428 Other cardiomyopathies: Secondary | ICD-10-CM

## 2015-12-11 NOTE — Progress Notes (Signed)
   Subjective:    Patient ID: [name], male    DOB: [birth year], 60 y.o.   MRN: [id number]  HPI  CC: follow up   # Hypertension:  Restarted medications: coreg, lasix  Improved  Chest pains as below  # Chest pains / known heart failure  Persistent chest pains, says have been present for several years.   Pain can be related to exertion, but also that it can happen when sitting and not doing anything  Pain is central, doesn't usually radiate  Takes nitro tablets, he is not sure if they help all that much  Previously a patient of Dr. Haroldine Laws, says he just stopped going and hasn't followed up ROS: no shortness of breath, no leg swelling, voiding "slightly" improved on lasix but denies issues with starting/stopping/leakage/weak stream  # Cough  Has been present for at least a week  Some runny nose, congestion  No ear pain  Has tried some OTC without much relief  Social Hx: former smoker, says quit around 2013 when he stopped driving (would smoke to keep himself awake)  Review of Systems   See HPI for ROS.   Past medical history, surgical, family, and social history reviewed and updated in the EMR as appropriate. Objective:  BP 142/90 mmHg  Pulse 103  Temp(Src) 98.6 F (37 C) (Oral)  Ht 5\' 11"  (1.803 m)  Wt 198 lb 9.6 oz (90.084 kg)  BMI 27.71 kg/m2  SpO2 97% Vitals and nursing note reviewed  General: no apparent distress  CV: normal rate (borderline tachycardia), regular rhythm, normal heart sounds, no murmur appreciated Resp: clear to auscultation bilaterally, no wheezes, rhonchi or crackles, normal effort Extremities: no LE edema  Assessment & Plan:  Hypertension Improved and close to target goal on just restarting coreg. He has previously also been on hydralazine, imdur, lisinopril, spironolactone all of which he has been off since not returning to HF clinic several years ago. Will keep only coreg on for now.  Chronic systolic heart failure  ([facility]) Improved somewhat symptoms with lasix. Feel cough more likely related to viral URI or bronchitis. He is not on lisinopril currently so do not feel this is as likely. Discussed with him that it has been several years since he has had an echo or stress test, would be prudent to have him return and re-establish with HF clinic, referral placed today.  Cough Would favor viral bronchitis/URI as contributing cause to his current cough. Not SOB currently and normal oxygen saturation. Continue OTC, hand hygiene, follow up if not improving over next few weeks

## 2015-12-11 NOTE — Assessment & Plan Note (Signed)
Improved somewhat symptoms with lasix. Feel cough more likely related to viral URI or bronchitis. He is not on lisinopril currently so do not feel this is as likely. Discussed with him that it has been several years since he has had an echo or stress test, would be prudent to have him return and re-establish with HF clinic, referral placed today.

## 2015-12-11 NOTE — Assessment & Plan Note (Signed)
Improved and close to target goal on just restarting coreg. He has previously also been on hydralazine, imdur, lisinopril, spironolactone all of which he has been off since not returning to HF clinic several years ago. Will keep only coreg on for now.

## 2015-12-11 NOTE — Patient Instructions (Signed)
We made a referral to the cardiologist office to re-establish with them.  Your blood pressure was much better today.  Reasons to return include: new fever, worsening shortness of breath, worsening chest pain.

## 2016-01-01 MED FILL — TRIAMCINOLONE 0.5% OINTMENT: 0.5 | 15 days supply | Qty: 45 | Fill #9

## 2016-01-18 ENCOUNTER — Ambulatory Visit (HOSPITAL_COMMUNITY)
Admission: RE | Admit: 2016-01-18 | Discharge: 2016-01-18 | Disposition: A | Discharge status: Home / Self Care | Payer: Medicare Other | Source: Ambulatory Visit | Attending: Internal Medicine | Admitting: Internal Medicine

## 2016-01-18 ENCOUNTER — Encounter (HOSPITAL_COMMUNITY): Payer: Self-pay | Admitting: Internal Medicine

## 2016-01-18 VITALS — BP 174/92 | HR 81 | Wt 197.8 lb

## 2016-01-18 DIAGNOSIS — I428 Other cardiomyopathies: Secondary | ICD-10-CM | POA: Insufficient documentation

## 2016-01-18 DIAGNOSIS — F1010 Alcohol abuse, uncomplicated: Secondary | ICD-10-CM | POA: Diagnosis not present

## 2016-01-18 DIAGNOSIS — I159 Secondary hypertension, unspecified: Secondary | ICD-10-CM

## 2016-01-18 DIAGNOSIS — N183 Chronic kidney disease, stage 3 (moderate): Secondary | ICD-10-CM | POA: Insufficient documentation

## 2016-01-18 DIAGNOSIS — I129 Hypertensive chronic kidney disease with stage 1 through stage 4 chronic kidney disease, or unspecified chronic kidney disease: Secondary | ICD-10-CM | POA: Insufficient documentation

## 2016-01-18 DIAGNOSIS — I5022 Chronic systolic (congestive) heart failure: Secondary | ICD-10-CM | POA: Diagnosis not present

## 2016-01-18 DIAGNOSIS — Z7982 Long term (current) use of aspirin: Secondary | ICD-10-CM | POA: Insufficient documentation

## 2016-01-18 DIAGNOSIS — I429 Cardiomyopathy, unspecified: Secondary | ICD-10-CM | POA: Diagnosis not present

## 2016-01-18 DIAGNOSIS — Z79899 Other long term (current) drug therapy: Secondary | ICD-10-CM | POA: Insufficient documentation

## 2016-01-18 MED ORDER — LISINOPRIL 20 MG PO TABS: 20.0000 mg | ORAL_TABLET | Freq: Every day | ORAL | Status: DC

## 2016-01-18 MED FILL — LISINOPRIL 20 MG TABLET: 20 | 30 days supply | Qty: 30 | Fill #0

## 2016-01-18 NOTE — Progress Notes (Signed)
Advanced Heart Failure Medication Review by a Pharmacist  Does the patient  feel that his/her medications are working for him/her?  yes  Has the patient been experiencing any side effects to the medications prescribed?  no  Does the patient measure his/her own blood pressure or blood glucose at home?  no   Does the patient have any problems obtaining medications due to transportation or finances?   no  Understanding of regimen: fair Understanding of indications: fair Potential of compliance: poor Patient understands to avoid NSAIDs. Patient understands to avoid decongestants.  Issues to address at subsequent visits: Compliance with meds and visits   Pharmacist comments:  [name] is a pleasant 60 yo M presenting without a medication list. He admits to not taking his medications for over 2 years 2/2 running out of refills and not wanting to come to his appointments since they are too expensive for him. We did provide him with one month refills of lasix and carvedilol last month which he states he has been taking appropriately. We discussed the importance of appropriate follow up and he verbalized understanding and was grateful for the explanation.   Ruta Hinds. Velva Harman, PharmD, BCPS, CPP Clinical Pharmacist Pager: [phone] Phone: [phone] 01/18/2016 3:13 PM      Time with patient: 10 minutes Preparation and documentation time: 2 minutes Total time: 12 minutes

## 2016-01-18 NOTE — Patient Instructions (Signed)
Start Lisinopril 20 mg daily  Labs in 10 days  Your physician has requested that you have an echocardiogram. Echocardiography is a painless test that uses sound waves to create images of your heart. It provides your doctor with information about the size and shape of your heart and how well your heart's chambers and valves are working. This procedure takes approximately one hour. There are no restrictions for this procedure.  We will contact you in 1 year to schedule your next appointment.

## 2016-01-18 NOTE — Progress Notes (Signed)
ADVANCED HF CLINIC NOTE  Patient ID: Quency Kitzinger, male   DOB: [birth year], 60 y.o.   MRN: [id number] PCP: [place].   Weight Range  180  Baseline proBNP  3473    HPI: Mr. Rutt is a 60 y.o. gentlemen with history of HTN, daily EtOH and previous diagnosis of biventricular heart failure with EF 15% with recovered EF. He has chronic atypical chest pain.  He has not been seen in clinic since 2014.  He called last month requesting refills and was given 1 month supply of all his meds but he only picked up carvedilol and lasix. Mild dyspnea walking. Denies PND/Orthopnea. Weight at home 190-195 pounds.  Leans on the cart walking in the grocery store. Drinks 2 acholic drinks a week.    05/25/12 RHC/LHC  RA = 7  RV = 56/3/10  PA = 52/23 (34)  PCW = 17 v= 25  Fick cardiac output/index = 7.8/3.9  Thermo CO/CI = 6.1/3.0  PVR = 2.1 Woods  SVR = 1194  FA sat = 97%  PA sat = 79%, 80%  High SVC = 79%  EF 15%  Coronaries normal.  CPX 07/14/12: Peak VO2: 21.14ml/kg/min  % predicted peak VO2: 65.1%, VE/VCO2 slope: 25.0, OUES: 2.27, Peak RER: 1.12, Ventilatory Threshold: 12.4 % predicted peak VO2: 38.3%.    04/2012 Echo: LVEF 15%, diffuse hypokinesis. Grade 2 diastolic dysfunction. LA mildly dilated. RV moderately reduced. RA mildly dilated. PAPP 44 mmHg.  04/2012 Abd u/s: Small volume abdominal ascites. Cannot exclude mild cirrhosis.  04/2012 VQ scan: Very low probability for pulmonary embolism.  Echo 08/06/12: LVEF [id number], grade 1 diastolic dysfunction.  RV recovered.  CT Chest/Abdomen 09/2012: normal with no dissection.  Echo 7/14: LVEF 50-55% Even monitor: lots of transmitted episodes. Tracing all NSR with no ectopy.   06/02/12 Potassium 4.8 Creatinine 1.8 06/15/12 Potassium 4.5 Creatinine 1.45 7/14 Potassium 4 Creatinine 1.46 12/08/2015: K 3.6 Creatinine 1.16   ROS: All systems negative except as listed in HPI, PMH and Problem List.  Past Medical History  Diagnosis Date  .  Hypertension   . Psoriasis     Possible psoriasis in the past  . Alcohol abuse     Current Outpatient Prescriptions  Medication Sig Dispense Refill  . aspirin 81 MG EC tablet TAKE 1 TABLET (81 MG TOTAL) BY MOUTH DAILY. 30 tablet 3  . carvedilol (COREG) 12.5 MG tablet Take 1 tablet (12.5 mg total) by mouth 2 (two) times daily with a meal. MUST HAVE FOLLOW UP APPOINTMENT FOR FURTHER REFILLS 60 tablet 0  . furosemide (LASIX) 40 MG tablet Take 1 tablet (40 mg total) by mouth 2 (two) times daily. 68 tablet 0  . nitroGLYCERIN (NITROSTAT) 0.4 MG SL tablet PLACE 1 TABLET UNDER THE TONGUE EVERY 5 MINUTES AS NEEDED FOR CHEST PAIN. (Patient not taking: Reported on 01/18/2016) 10 tablet 0   No current facility-administered medications for this encounter.     PHYSICAL EXAM: Filed Vitals:   01/18/16 1453  BP: 174/92  Pulse: 81  Weight: 197 lb 12 oz (89.699 kg)  SpO2: 98%    General:  No resp difficulty;  HEENT: normal Neck: supple. JVP flat. Carotids 2+ bilaterally; no bruits. No lymphadenopathy or thryomegaly appreciated. Cor: PMI normal. Regular rate & rhythm. No rubs, gallops, murmurs.  Lungs: clear Abdomen: soft, nontender, nondistended. No hepatosplenomegaly. No bruits or masses. Good bowel sounds. Aortic impulse not widened or overly prominent Extremities: no cyanosis, clubbing, rash, edema. Multiple areas of vitiligo  and skin lesions Neuro: alert & orientedx3, cranial nerves grossly intact. Moves all 4 extremities w/o difficulty. Affect pleasant.    ASSESSMENT & PLAN: 1. Uncontrolled HTN- Continue current regimen. Add 20 mg lisinopril daily. BMET in 10 days.  2. H/O NICM - EF recovered on echo 2014. NYHA II-III. Volume status stable. Continue lasix 40 mg twice a day. Continue coreg 12.5 mg twice a day. Restart lisinopril 20 mg daily. Repeat ECHO. IF EF down will need to continue HF meds.  3. H/O syncope - resolved.  4. H/O ETOH- resolved.  5. Chronic renal failure, class  III  Follow up 2 weeks for BMET. Repeat ECHO. IF is low again will need to have a visit in a month. If EF normal can follow up  In 1 year.    Amy Clegg NP-C

## 2016-01-26 ENCOUNTER — Ambulatory Visit (HOSPITAL_COMMUNITY)
Admission: RE | Admit: 2016-01-26 | Discharge: 2016-01-26 | Disposition: A | Discharge status: Home / Self Care | Payer: Medicare Other | Source: Ambulatory Visit | Attending: Cardiology | Admitting: Cardiology

## 2016-01-26 DIAGNOSIS — I5022 Chronic systolic (congestive) heart failure: Secondary | ICD-10-CM | POA: Insufficient documentation

## 2016-01-26 LAB — BASIC METABOLIC PANEL
ANION GAP: 10 (ref 5–15)
BUN: 16 mg/dL (ref 6–20)
CALCIUM: 8.7 mg/dL — AB (ref 8.9–10.3)
CO2: 28 mmol/L (ref 22–32)
Chloride: 104 mmol/L (ref 101–111)
Creatinine, Ser: 1.58 mg/dL — ABNORMAL HIGH (ref 0.61–1.24)
GFR calc non Af Amer: 46 mL/min — ABNORMAL LOW (ref >60)
GFR, EST AFRICAN AMERICAN: 53 mL/min — AB (ref >60)
Glucose, Bld: 98 mg/dL (ref 65–99)
Potassium: 3.5 mmol/L (ref 3.5–5.1)
Sodium: 142 mmol/L (ref 135–145)

## 2016-02-01 ENCOUNTER — Ambulatory Visit (HOSPITAL_COMMUNITY): Payer: Medicare Other | Attending: Cardiology

## 2016-02-01 ENCOUNTER — Other Ambulatory Visit: Payer: Self-pay

## 2016-02-01 DIAGNOSIS — R29898 Other symptoms and signs involving the musculoskeletal system: Secondary | ICD-10-CM | POA: Insufficient documentation

## 2016-02-01 DIAGNOSIS — I509 Heart failure, unspecified: Secondary | ICD-10-CM | POA: Diagnosis present

## 2016-02-01 DIAGNOSIS — I5022 Chronic systolic (congestive) heart failure: Secondary | ICD-10-CM

## 2016-02-01 DIAGNOSIS — I110 Hypertensive heart disease with heart failure: Secondary | ICD-10-CM | POA: Diagnosis not present

## 2016-02-01 DIAGNOSIS — I340 Nonrheumatic mitral (valve) insufficiency: Secondary | ICD-10-CM | POA: Insufficient documentation

## 2016-02-01 MED FILL — TRIAMCINOLONE 0.5% OINTMENT: 0.5 | 15 days supply | Qty: 45 | Fill #10

## 2016-03-01 MED FILL — LISINOPRIL 20 MG TABLET: 20 | 30 days supply | Qty: 30 | Fill #1

## 2016-03-01 MED FILL — TRIAMCINOLONE 0.5% OINTMENT: 0.5 | 15 days supply | Qty: 45 | Fill #11

## 2016-03-22 ENCOUNTER — Encounter: Payer: Self-pay | Admitting: Family Medicine

## 2016-03-22 ENCOUNTER — Ambulatory Visit (INDEPENDENT_AMBULATORY_CARE_PROVIDER_SITE_OTHER): Payer: Medicare Other | Admitting: Family Medicine

## 2016-03-22 VITALS — BP 158/100 | HR 88 | Temp 98.5°F | Wt 199.0 lb

## 2016-03-22 DIAGNOSIS — I159 Secondary hypertension, unspecified: Secondary | ICD-10-CM

## 2016-03-22 DIAGNOSIS — M109 Gout, unspecified: Secondary | ICD-10-CM | POA: Insufficient documentation

## 2016-03-22 DIAGNOSIS — M10071 Idiopathic gout, right ankle and foot: Secondary | ICD-10-CM | POA: Diagnosis not present

## 2016-03-22 DIAGNOSIS — I5022 Chronic systolic (congestive) heart failure: Secondary | ICD-10-CM | POA: Diagnosis not present

## 2016-03-22 MED ORDER — CARVEDILOL 12.5 MG PO TABS: 12.5000 mg | ORAL_TABLET | Freq: Two times a day (BID) | ORAL | Status: DC

## 2016-03-22 MED ORDER — COLCHICINE 0.6 MG PO TABS: ORAL_TABLET | ORAL | Status: DC

## 2016-03-22 MED FILL — CARVEDILOL 12.5 MG TABLET: 12.5 | 30 days supply | Qty: 60 | Fill #0

## 2016-03-22 MED FILL — COLCRYS 0.6 MG TABLET: 0.6 | 5 days supply | Qty: 10 | Fill #0

## 2016-03-22 NOTE — Assessment & Plan Note (Signed)
Uncontrolled due to patient being out of medications. -patient has refills at pharmacy, told him to call today -refill of Coreg provided -follow up with PCP in 1-2 weeks for BP check

## 2016-03-22 NOTE — Assessment & Plan Note (Signed)
Right MTP joint pain consistent with gout flare. -treat acute flare with colchicine -consider prophylactic therapy at follow up.

## 2016-03-22 NOTE — Patient Instructions (Addendum)
It was nice to meet you today.  Right foot pain - likely gout, start Colchicine as prescribed  Blood pressure - elevated today, you have refills of Lisinopril at your pharmacy (please call them). Start to take Coreg 12.5 mg twice daily (I have sent in a refill).  Make a follow up appointment with Dr. Raeford Razor to recheck blood pressure.

## 2016-03-22 NOTE — Progress Notes (Signed)
   Subjective:    Patient ID: [name], male    DOB: 11/23/1955, 60 y.o.   MRN: [id number]  HPI 60 y/o male presents for right great toe pain.  Pain in right great toe - present for 2-3 days, had similar experience a few months ago, no injury to toe, no fevers  HTN - has been out of BP medication for past few weeks, out of Lisinopril, stable chest pain (seen by cardiology), no headaches, no acute vision changes, no acute weakness/numbness.   Social - 3-4 drinks per week, occasional red meat.  Review of Systems  Constitutional: Negative for fever, chills and fatigue.  Respiratory: Negative for cough and shortness of breath.   Gastrointestinal: Negative for nausea, vomiting and diarrhea.       Objective:   Physical Exam BP 158/100 mmHg  Pulse 88  Temp(Src) 98.5 F (36.9 C) (Oral)  Wt 199 lb (90.266 kg)  SpO2 96% Gen: pleasant male, NAD Cardiac: RRR, S1 and S2 present, no murmur Resp: CTAB, normal effort Ext: right MTP joint tender to palpation, mild erythema, 2+ DP pulse  Reviewed most recent BMP on 4/28 (Cr 1.58, GFR 46)     Assessment & Plan:  Gout Right MTP joint pain consistent with gout flare. -treat acute flare with colchicine -consider prophylactic therapy at follow up.   Hypertension Uncontrolled due to patient being out of medications. -patient has refills at pharmacy, told him to call today -refill of Coreg provided -follow up with PCP in 1-2 weeks for BP check

## 2016-03-23 LAB — BASIC METABOLIC PANEL WITH GFR
BUN: 17 mg/dL (ref 7–25)
CO2: 23 mmol/L (ref 20–31)
CREATININE: 1.35 mg/dL — AB (ref 0.70–1.25)
Calcium: 9 mg/dL (ref 8.6–10.3)
Chloride: 104 mmol/L (ref 98–110)
GFR, EST AFRICAN AMERICAN: 65 mL/min (ref >=60)
GFR, Est Non African American: 56 mL/min — ABNORMAL LOW (ref >=60)
Glucose, Bld: 88 mg/dL (ref 65–99)
POTASSIUM: 4.4 mmol/L (ref 3.5–5.3)
Sodium: 141 mmol/L (ref 135–146)

## 2016-03-25 ENCOUNTER — Ambulatory Visit: Payer: Medicare Other | Admitting: Family Medicine

## 2016-03-25 ENCOUNTER — Encounter: Payer: Self-pay | Admitting: Family Medicine

## 2016-03-26 MED FILL — COLCRYS 0.6 MG TABLET: 0.6 | 5 days supply | Qty: 10 | Fill #1

## 2016-04-03 ENCOUNTER — Ambulatory Visit (INDEPENDENT_AMBULATORY_CARE_PROVIDER_SITE_OTHER): Payer: Medicare Other | Admitting: Family Medicine

## 2016-04-03 ENCOUNTER — Other Ambulatory Visit: Payer: Self-pay | Admitting: Family Medicine

## 2016-04-03 VITALS — BP 162/88 | HR 79 | Temp 98.0°F | Ht 71.0 in | Wt 198.0 lb

## 2016-04-03 DIAGNOSIS — I159 Secondary hypertension, unspecified: Secondary | ICD-10-CM

## 2016-04-03 DIAGNOSIS — L309 Dermatitis, unspecified: Secondary | ICD-10-CM

## 2016-04-03 DIAGNOSIS — I5022 Chronic systolic (congestive) heart failure: Secondary | ICD-10-CM

## 2016-04-03 MED ORDER — TRIAMCINOLONE ACETONIDE 0.5 % EX CREA: 1.0000 "application " | TOPICAL_CREAM | Freq: Every day | CUTANEOUS | Status: DC | PRN

## 2016-04-03 MED FILL — TRIAMCINOLONE 0.5% CREAM: 0.5 | 30 days supply | Qty: 30 | Fill #0

## 2016-04-03 NOTE — Assessment & Plan Note (Signed)
Reports history of psoriasis, appears previously treated here for "eczema" with topical high potency triamcinolone ointment Has apparently seen derm in the past Refill triamcinolone cream today - instructed patient to follow up with new PCP to discuss this further He may benefit from derm referral & more psoriasis-specific therapies.

## 2016-04-03 NOTE — Assessment & Plan Note (Signed)
Last CHF clinic note reviewed. Echo performed in May of this year with reduced EF compared to prior. Plan was for patient to follow up in CHF clinic 1 month later if EF down, though this hasn't been scheduled. Not taking lasix, though volume status looks good today & weight is stable. -Instructed patient to call CHF clinic & schedule appointment

## 2016-04-03 NOTE — Assessment & Plan Note (Signed)
Blood pressure elevated, including on recheck. Patient did not take medications yet today. Will have him return for RN blood pressure check in a week or so on a day he's taken his medications.

## 2016-04-03 NOTE — Progress Notes (Signed)
Date of Visit: 04/03/2016   HPI:  Patient presents to follow up on hypertension.  Seen on 6/23 by Dr. Ree Kida for gout flare, blood pressure noted elevated at that visit. Had been out of blood pressure medications.  Last CHF clinic note reviewed - had echo done. EF [id number], grade 2 diastolic dysfunction.  Supposed to take coreg 12.5mg  twice daily, lasix 40mg  twice daily, and lisinopril 20mg  daily. Patient reports not taking lasix as prescribed, as he doesn't have it. Denies swelling. Does get a little winded when he walks, has to rest. Reports chronic chest pain, nothing new or different, same pain for which he's been previously evaluated.  Also requests refill on his medicine for psoriasis. Unable to recall the name of it but it starts with a T. Reports he's been to derm in the past, but in recent years has been just treated here at [facility] with this topical ointment.  ROS: See HPI.  [place]: history of hypertension, chronic systolic CHF, NICM, gout, eczema, alcohol abuse  PHYSICAL EXAM: BP 162/88 mmHg  Pulse 79  Temp(Src) 98 F (36.7 C) (Oral)  Ht 5\' 11"  (1.803 m)  Wt 198 lb (89.812 kg)  BMI 27.63 kg/m2  repeat 160/94 Gen: NAD, pleasant, cooperative HEENT: normocephalic, atraumatic, moist mucous membranes  Heart: regular rate and rhythm, no murmur Lungs: clear to auscultation bilaterally, normal work of breathing  Neuro: alert, grossly nonfocal, speech normal Ext: No appreciable lower extremity edema bilaterally Skin: vitiligo/loss of pigmentation present on left anterior upper arm and on bilateral hands. Multiple skin-colored dime-sized raised plaques on bilateral arms.  ASSESSMENT/PLAN:  Hypertension Blood pressure elevated, including on recheck. Patient did not take medications yet today. Will have him return for RN blood pressure check in a week or so on a day he's taken his medications.  Chronic systolic heart failure (HCC) Last CHF clinic note reviewed. Echo  performed in May of this year with reduced EF compared to prior. Plan was for patient to follow up in CHF clinic 1 month later if EF down, though this hasn't been scheduled. Not taking lasix, though volume status looks good today & weight is stable. -Instructed patient to call CHF clinic & schedule appointment  Chronic eczema Reports history of psoriasis, appears previously treated here for "eczema" with topical high potency triamcinolone ointment Has apparently seen derm in the past Refill triamcinolone cream today - instructed patient to follow up with new PCP to discuss this further He may benefit from derm referral & more psoriasis-specific therapies.   FOLLOW UP: Follow up in 1 week for RN blood pressure check Schedule follow up in CHF clinic Schedule appointment to meet new PCP  [place] J. Ardelia Mems, [place]

## 2016-04-03 NOTE — Patient Instructions (Signed)
Your blood pressure is up but it might be since you didn't take your medication yet this morning.  Schedule a nurse blood pressure check for a day when you've taken your medicine.  Call the CHF clinic to set up an appointment. Your heart pumping function was a little reduced when they did the ultrasound.  Sent in refill on the cream for your skin (triamcinolone). Please follow up with your new primary doctor, Dr. Emmaline Life to talk about your blood pressure and skin issues.  Be well, Dr. Ardelia Mems

## 2016-04-09 ENCOUNTER — Encounter (HOSPITAL_COMMUNITY): Payer: Self-pay | Admitting: Emergency Medicine

## 2016-04-09 ENCOUNTER — Emergency Department (HOSPITAL_COMMUNITY): Payer: Medicare Other

## 2016-04-09 ENCOUNTER — Other Ambulatory Visit: Payer: Self-pay

## 2016-04-09 ENCOUNTER — Ambulatory Visit (INDEPENDENT_AMBULATORY_CARE_PROVIDER_SITE_OTHER): Payer: Medicare Other | Admitting: *Deleted

## 2016-04-09 ENCOUNTER — Encounter: Payer: Self-pay | Admitting: Obstetrics and Gynecology

## 2016-04-09 ENCOUNTER — Other Ambulatory Visit (HOSPITAL_COMMUNITY): Payer: Self-pay

## 2016-04-09 ENCOUNTER — Ambulatory Visit (HOSPITAL_COMMUNITY)
Admission: RE | Admit: 2016-04-09 | Discharge: 2016-04-09 | Disposition: A | Discharge status: Home / Self Care | Payer: Medicare Other | Source: Ambulatory Visit | Attending: Family Medicine | Admitting: Family Medicine

## 2016-04-09 ENCOUNTER — Ambulatory Visit (INDEPENDENT_AMBULATORY_CARE_PROVIDER_SITE_OTHER): Payer: Medicare Other | Admitting: Obstetrics and Gynecology

## 2016-04-09 ENCOUNTER — Observation Stay (HOSPITAL_COMMUNITY)
Admission: EM | Admit: 2016-04-09 | Discharge: 2016-04-10 | Disposition: A | Discharge status: Home / Self Care | Payer: Medicare Other | Attending: Family Medicine | Admitting: Family Medicine

## 2016-04-09 VITALS — BP 158/88 | HR 70 | Wt 198.0 lb

## 2016-04-09 VITALS — BP 158/88 | HR 70

## 2016-04-09 DIAGNOSIS — R9431 Abnormal electrocardiogram [ECG] [EKG]: Secondary | ICD-10-CM

## 2016-04-09 DIAGNOSIS — G8929 Other chronic pain: Principal | ICD-10-CM | POA: Insufficient documentation

## 2016-04-09 DIAGNOSIS — R079 Chest pain, unspecified: Secondary | ICD-10-CM | POA: Diagnosis present

## 2016-04-09 DIAGNOSIS — I10 Essential (primary) hypertension: Secondary | ICD-10-CM

## 2016-04-09 DIAGNOSIS — I428 Other cardiomyopathies: Secondary | ICD-10-CM

## 2016-04-09 DIAGNOSIS — N183 Chronic kidney disease, stage 3 (moderate): Secondary | ICD-10-CM | POA: Diagnosis not present

## 2016-04-09 DIAGNOSIS — I429 Cardiomyopathy, unspecified: Secondary | ICD-10-CM | POA: Insufficient documentation

## 2016-04-09 DIAGNOSIS — R0789 Other chest pain: Secondary | ICD-10-CM | POA: Insufficient documentation

## 2016-04-09 DIAGNOSIS — N289 Disorder of kidney and ureter, unspecified: Secondary | ICD-10-CM

## 2016-04-09 DIAGNOSIS — I159 Secondary hypertension, unspecified: Secondary | ICD-10-CM

## 2016-04-09 DIAGNOSIS — Z7982 Long term (current) use of aspirin: Secondary | ICD-10-CM | POA: Insufficient documentation

## 2016-04-09 DIAGNOSIS — R05 Cough: Secondary | ICD-10-CM | POA: Diagnosis not present

## 2016-04-09 DIAGNOSIS — Z87891 Personal history of nicotine dependence: Secondary | ICD-10-CM | POA: Insufficient documentation

## 2016-04-09 DIAGNOSIS — Z0130 Encounter for examination of blood pressure without abnormal findings: Secondary | ICD-10-CM

## 2016-04-09 DIAGNOSIS — J9811 Atelectasis: Secondary | ICD-10-CM | POA: Insufficient documentation

## 2016-04-09 DIAGNOSIS — Z136 Encounter for screening for cardiovascular disorders: Secondary | ICD-10-CM

## 2016-04-09 DIAGNOSIS — M109 Gout, unspecified: Secondary | ICD-10-CM | POA: Diagnosis not present

## 2016-04-09 DIAGNOSIS — I130 Hypertensive heart and chronic kidney disease with heart failure and stage 1 through stage 4 chronic kidney disease, or unspecified chronic kidney disease: Secondary | ICD-10-CM | POA: Insufficient documentation

## 2016-04-09 DIAGNOSIS — I5022 Chronic systolic (congestive) heart failure: Secondary | ICD-10-CM | POA: Diagnosis present

## 2016-04-09 DIAGNOSIS — I5042 Chronic combined systolic (congestive) and diastolic (congestive) heart failure: Secondary | ICD-10-CM

## 2016-04-09 DIAGNOSIS — R0602 Shortness of breath: Secondary | ICD-10-CM | POA: Diagnosis not present

## 2016-04-09 HISTORY — DX: Chronic combined systolic (congestive) and diastolic (congestive) heart failure: I50.42

## 2016-04-09 LAB — CK TOTAL AND CKMB (NOT AT ARMC)
CK, MB: 2.4 ng/mL (ref 0.5–5.0)
RELATIVE INDEX: 2.3 (ref 0.0–2.5)
Total CK: 103 U/L (ref 49–397)

## 2016-04-09 LAB — BRAIN NATRIURETIC PEPTIDE: B Natriuretic Peptide: 1299.2 pg/mL — ABNORMAL HIGH (ref 0.0–100.0)

## 2016-04-09 LAB — BASIC METABOLIC PANEL
Anion gap: 8 (ref 5–15)
BUN: 16 mg/dL (ref 6–20)
CALCIUM: 8.6 mg/dL — AB (ref 8.9–10.3)
CO2: 25 mmol/L (ref 22–32)
CREATININE: 1.48 mg/dL — AB (ref 0.61–1.24)
Chloride: 102 mmol/L (ref 101–111)
GFR calc non Af Amer: 49 mL/min — ABNORMAL LOW (ref >60)
GFR, EST AFRICAN AMERICAN: 57 mL/min — AB (ref >60)
Glucose, Bld: 106 mg/dL — ABNORMAL HIGH (ref 65–99)
Potassium: 3.9 mmol/L (ref 3.5–5.1)
SODIUM: 135 mmol/L (ref 135–145)

## 2016-04-09 LAB — CREATININE, SERUM
CREATININE: 1.36 mg/dL — AB (ref 0.61–1.24)
GFR calc non Af Amer: 54 mL/min — ABNORMAL LOW (ref >60)

## 2016-04-09 LAB — I-STAT TROPONIN, ED: TROPONIN I, POC: 0.02 ng/mL (ref 0.00–0.08)

## 2016-04-09 LAB — CBC WITH DIFFERENTIAL/PLATELET
BASOS PCT: 0 %
Basophils Absolute: 0 10*3/uL (ref 0.0–0.1)
EOS ABS: 0.3 10*3/uL (ref 0.0–0.7)
Eosinophils Relative: 4 %
HCT: 40.5 % (ref 39.0–52.0)
Hemoglobin: 13.5 g/dL (ref 13.0–17.0)
LYMPHS ABS: 1.1 10*3/uL (ref 0.7–4.0)
Lymphocytes Relative: 14 %
MCH: 29.3 pg (ref 26.0–34.0)
MCHC: 33.3 g/dL (ref 30.0–36.0)
MCV: 87.9 fL (ref 78.0–100.0)
MONO ABS: 1.1 10*3/uL — AB (ref 0.1–1.0)
MONOS PCT: 14 %
Neutro Abs: 5.2 10*3/uL (ref 1.7–7.7)
Neutrophils Relative %: 68 %
Platelets: 241 10*3/uL (ref 150–400)
RBC: 4.61 MIL/uL (ref 4.22–5.81)
RDW: 14 % (ref 11.5–15.5)
WBC: 7.6 10*3/uL (ref 4.0–10.5)

## 2016-04-09 LAB — TROPONIN I: Troponin I: 0.03 ng/mL (ref <0.03)

## 2016-04-09 LAB — CBC
HCT: 42.1 % (ref 39.0–52.0)
HEMOGLOBIN: 14 g/dL (ref 13.0–17.0)
MCH: 29.4 pg (ref 26.0–34.0)
MCHC: 33.3 g/dL (ref 30.0–36.0)
MCV: 88.3 fL (ref 78.0–100.0)
Platelets: 232 10*3/uL (ref 150–400)
RBC: 4.77 MIL/uL (ref 4.22–5.81)
RDW: 13.9 % (ref 11.5–15.5)
WBC: 7.4 10*3/uL (ref 4.0–10.5)

## 2016-04-09 LAB — TSH: TSH: 3.429 u[IU]/mL (ref 0.350–4.500)

## 2016-04-09 MED ORDER — GI COCKTAIL ~~LOC~~: 30.0000 mL | Freq: Four times a day (QID) | ORAL | Status: DC | PRN

## 2016-04-09 MED ORDER — NITROGLYCERIN 0.4 MG SL SUBL
0.4000 mg | SUBLINGUAL_TABLET | SUBLINGUAL | Status: DC | PRN
  Administered 2016-04-09 (×2): 0.4 mg via SUBLINGUAL

## 2016-04-09 MED ORDER — MORPHINE SULFATE 10 MG/ML IJ SOLN
1.0000 mg | Freq: Once | INTRAMUSCULAR | Status: AC
  Administered 2016-04-09: 1 mg via INTRAVENOUS

## 2016-04-09 MED ORDER — MORPHINE SULFATE (PF) 2 MG/ML IV SOLN
2.0000 mg | INTRAVENOUS | Status: DC | PRN
  Administered 2016-04-09: 2 mg via INTRAVENOUS
  Filled 2016-04-09: qty 1

## 2016-04-09 MED ORDER — ASPIRIN EC 81 MG PO TBEC
81.0000 mg | DELAYED_RELEASE_TABLET | Freq: Every day | ORAL | Status: DC
  Administered 2016-04-10: 81 mg via ORAL
  Filled 2016-04-09: qty 1

## 2016-04-09 MED ORDER — CARVEDILOL 12.5 MG PO TABS
12.5000 mg | ORAL_TABLET | Freq: Two times a day (BID) | ORAL | Status: DC
  Filled 2016-04-09: qty 1

## 2016-04-09 MED ORDER — HYDRALAZINE HCL 20 MG/ML IJ SOLN
5.0000 mg | INTRAMUSCULAR | Status: DC | PRN
  Administered 2016-04-09 – 2016-04-10 (×3): 5 mg via INTRAVENOUS
  Filled 2016-04-09 (×3): qty 1

## 2016-04-09 MED ORDER — ASPIRIN 325 MG PO TABS
325.0000 mg | ORAL_TABLET | Freq: Every day | ORAL | Status: DC
  Administered 2016-04-09: 325 mg via ORAL

## 2016-04-09 MED ORDER — ENOXAPARIN SODIUM 40 MG/0.4ML ~~LOC~~ SOLN
40.0000 mg | Freq: Every day | SUBCUTANEOUS | Status: DC
  Administered 2016-04-09: 40 mg via SUBCUTANEOUS
  Filled 2016-04-09 (×2): qty 0.4

## 2016-04-09 MED ORDER — ONDANSETRON HCL 4 MG/2ML IJ SOLN: 4.0000 mg | Freq: Four times a day (QID) | INTRAMUSCULAR | Status: DC | PRN

## 2016-04-09 MED ORDER — LISINOPRIL 20 MG PO TABS
20.0000 mg | ORAL_TABLET | Freq: Every day | ORAL | Status: DC
  Administered 2016-04-10: 20 mg via ORAL
  Filled 2016-04-09: qty 1

## 2016-04-09 MED ORDER — ACETAMINOPHEN 325 MG PO TABS
650.0000 mg | ORAL_TABLET | ORAL | Status: DC | PRN
  Administered 2016-04-10: 650 mg via ORAL
  Filled 2016-04-09: qty 2

## 2016-04-09 MED ORDER — CARVEDILOL 12.5 MG PO TABS
12.5000 mg | ORAL_TABLET | Freq: Two times a day (BID) | ORAL | Status: DC
  Administered 2016-04-09: 12.5 mg via ORAL

## 2016-04-09 MED ORDER — FUROSEMIDE 10 MG/ML IJ SOLN
40.0000 mg | INTRAMUSCULAR | Status: AC
  Administered 2016-04-09: 40 mg via INTRAVENOUS
  Filled 2016-04-09: qty 4

## 2016-04-09 NOTE — ED Notes (Signed)
Patient returned from radiology

## 2016-04-09 NOTE — H&P (Signed)
[facility] Admission History and Physical Service Pager: [phone]  Patient name: [name] Medical record number: [id number] Date of birth: [birth year] Age: 60 y.o. Gender: male  Primary Care Provider: Lockie Pares, MD Consultants: Cardiology Code Status: Full  Chief Complaint: Chest Pain  Assessment and Plan: [name] is a 59 y.o. male presenting with Chest Pain. PMH is significant for HFrEF, NICM, and HTN  Chest Pain. Concern for cardiac etiology. Initial troponin negative. EKG with nonspecific T wave inversions. Heart score 3-4. Aspirin, nitroglycerin, and morphine given at [facility]. CXR with atelectasis in LLL base, but no other clinical signs of PNA. Wells score 0. May be a progression/acute exacerbation of patient's chronic chest pain. Normal cath in 2013.  - Cardiology following, appreciate assistance - Telemetry - Trend troponin x3 - Repeat EKG in AM - Check lipid panel, TSH, A1c  Hypertension. 190s/90s on admission. On coreg and lisinopril at home. - Resume home meds, increase as tolerated - IV hydralazine 5mg  prn SBP>180  HFrEF, NICM. Last echo with EF 35-40% EF three months ago. Has been as low as 15% EF four years ago. Does not appear clinically overloaded, though has some possible pulmonary edema on CXR. BNP elevated, baseline unknown. Weight stable over the past 3-4 months. - Lasix 40mg  IV x1 given in ED - Re-evaluate volume status in AM, dose diuretic as needed - I/Os, daily weights.   CKD 3A. Cr 1.48 on admission. Baseline 1.5-1.6 - Continue home lisinopril - Trend BMP - Avoid nephrotoxic medications  FEN/GI: Heart healthy diet, SLIV Prophylaxis: Lovenox  Disposition: Admitted pending above management.   History of Present Illness:  [name] is a 60 y.o. male presenting with chest pain.  Symptoms started 4 days ago when patient noticed an acute worsening of pain in the left side of his chest. Pain described as a  pressure-like sensation. Patient was watching TV when he first noticed the pain. No obvious precipitating events. No fevers or chills. Has been a little short of breath, but no more than normal. No cough, fevers, or chills. Pain is worse with movement and better with rest. No LE edema or worsened orthopnea. No nausea, vomiting, constipation, diarrhea, or abdominal pain. No recent weight gain.   Patient presented to the [facility] today and was found to have an EKG with concerning findings for ACS. He was given a dose of morphine, ASA, nitroglycerin and sent to the ED for further evaluation.  Currently, patient thinks that his pain is about the same.   Review Of Systems: Per HPI, otherwise the remainder of the systems were negative.  Patient Active Problem List   Diagnosis Date Noted  . Left sided chest pain 04/09/2016  . Gout 03/22/2016  . Poor dentition 05/08/2014  . Syncope 05/29/2013  . Chest pain 04/16/2013  . Pulmonary nodule, right 11/03/2012  . Dyspnea 11/03/2012  . Substernal chest pain 10/14/2012  . Chronic eczema 10/06/2012  . NICM (nonischemic cardiomyopathy) ([place]) 09/04/2012  . Chronic combined systolic and diastolic heart failure, NYHA class 3 ([place]) 06/02/2012  . Hypertension 05/21/2012  . Renal insufficiency 05/21/2012  . Alcohol abuse 05/21/2012  . Cardiac LV ejection fraction 10-20% 05/21/2012    Past Medical History: Past Medical History  Diagnosis Date  . Hypertension   . Psoriasis     Possible psoriasis in the past  . Alcohol abuse   . NICM (nonischemic cardiomyopathy) ([place]) 09/04/2012  . Chronic systolic heart failure ([place]) 06/02/2012  . Chronic combined systolic  and diastolic heart failure, NYHA class 3 ([place]) 06/02/2012    Past Surgical History: Past Surgical History  Procedure Laterality Date  . Skin graft      L arm got caught in machine in 1979  . Left and right heart catheterization with coronary angiogram N/A 05/25/2012    Procedure: LEFT AND RIGHT HEART  CATHETERIZATION WITH CORONARY ANGIOGRAM;  Surgeon: Jolaine Artist, MD;  Location: [facility] CATH LAB;  Service: Cardiovascular;  Laterality: N/A;    Social History: Social History  Substance Use Topics  . Smoking status: Former Smoker    Types: Cigars  . Smokeless tobacco: None     Comment: Rare cigar (black-n-milds)  . Alcohol Use: 0.0 oz/week    0 Standard drinks or equivalent per week     Comment: 3-4 12oz beers per day   Additional social history: None  Please also refer to relevant sections of EMR.  Family History: Family History  Problem Relation Age of Onset  . Heart failure Father   . Heart failure Other     Uncle. Possible MI?   Allergies and Medications: No Known Allergies No current facility-administered medications on file prior to encounter.   Current Outpatient Prescriptions on File Prior to Encounter  Medication Sig Dispense Refill  . aspirin 81 MG EC tablet TAKE 1 TABLET (81 MG TOTAL) BY MOUTH DAILY. 30 tablet 3  . carvedilol (COREG) 12.5 MG tablet Take 1 tablet (12.5 mg total) by mouth 2 (two) times daily with a meal. MUST HAVE FOLLOW UP APPOINTMENT FOR FURTHER REFILLS 60 tablet 2  . lisinopril (PRINIVIL,ZESTRIL) 20 MG tablet Take 1 tablet (20 mg total) by mouth daily. 30 tablet 12  . nitroGLYCERIN (NITROSTAT) 0.4 MG SL tablet PLACE 1 TABLET UNDER THE TONGUE EVERY 5 MINUTES AS NEEDED FOR CHEST PAIN. 10 tablet 0  . triamcinolone cream (KENALOG) 0.5 % Apply 1 application topically daily as needed. 30 g 0  . furosemide (LASIX) 40 MG tablet Take 1 tablet (40 mg total) by mouth 2 (two) times daily. (Patient not taking: Reported on 04/03/2016) 68 tablet 0   Objective: BP 188/96 mmHg  Pulse 66  Temp(Src) 98.5 F (36.9 C) (Oral)  Resp 20  SpO2 93% Exam: General: 60 year old man in NAD, lying in hospital bed Eyes: PERRL, EOMI ENTM: MMM Neck: Supple, FROM Cardiovascular: RRR, no murmurs noted Respiratory: NWOB, Very faint bibasilar crackles, otherwise  CTAB Abdomen: S, NT, ND MSK: No edema appreciated, no cyanosis Skin: Scattered psoriatic lesions across back. Warm, dry Neuro: Alert and oriented. No focal neurological deficits.  Psych: Appropriate mood and affect.   Labs and Imaging: CBC BMET   Recent Labs Lab 04/09/16 1107  WBC 7.6  HGB 13.5  HCT 40.5  PLT 241    Recent Labs Lab 04/09/16 1107  NA 135  K 3.9  CL 102  CO2 25  BUN 16  CREATININE 1.48*  GLUCOSE 106*  CALCIUM 8.6*     BNP 1299 Trop 0.02  EKG: NSR, LVH, TWI in V1, V2, V4, V5, V6, and aVF  Dg Chest 2 View  04/09/2016  CLINICAL DATA:  LEFT side squeezing chest pain, shortness of breath, loss of appetite and dry cough for 3 days, history non ischemic cardiomyopathy, chronic systolic heart failure, hypertension EXAM: CHEST  2 VIEW COMPARISON:  04/13/2013 FINDINGS: Enlargement of cardiac silhouette with pulmonary vascular congestion. Mediastinal contours normal. Chronic elevation of LEFT diaphragm with increased LEFT basilar atelectasis versus developing infiltrate. No additional infiltrate, pleural  effusion or pneumothorax. Bones unremarkable. IMPRESSION: Enlargement of cardiac silhouette with pulmonary vascular congestion. Increased atelectasis versus question developing infiltrate at LEFT lung base. Electronically Signed   By: Lavonia Dana M.D.   On: 04/09/2016 11:44   Vivi Barrack, MD 04/09/2016, 8:48 PM PGY-3, [place] Intern pager: [phone], text pages welcome

## 2016-04-09 NOTE — Progress Notes (Signed)
   Subjective:   Patient ID: [name], male    DOB: [birth year], 60 y.o.   MRN: [id number]  Patient presents for Same Day Appointment  Chief Complaint  Patient presents with  . Chest Pain  . Shortness of Breath    HPI: # CHEST PAIN Chest pain is chronic issue for patient However states that over the weekend chest pain got really bad He presented today in clinic for nurse visit for BP check but endorsed chest pain Pain feels like someone is squeezing his heart Did not take any nitro this morning Radiation: No Having some dizziness and shortness of breath when walking Has not followed up with cardiology in a while  Symptoms Nausea/vomiting: no Shortness of breath: yes Pleuritic pain: no Cough: no Swelling of legs: no Syncope: no  Patient Active Problem List   Diagnosis Date Noted  . Gout 03/22/2016  . Poor dentition 05/08/2014  . Syncope 05/29/2013  . Chest pain 04/16/2013  . Pulmonary nodule, right 11/03/2012  . Dyspnea 11/03/2012  . Substernal chest pain 10/14/2012  . Chronic eczema 10/06/2012  . NICM (nonischemic cardiomyopathy) ([place]) 09/04/2012  . Chronic systolic heart failure ([place]) 06/02/2012  . Hypertension 05/21/2012  . Renal insufficiency 05/21/2012  . Alcohol abuse 05/21/2012  . Cardiac LV ejection fraction 10-20% 05/21/2012    Review of Symptoms - see HPI  Past medical history, surgical, family, and social history reviewed and updated in the EMR as appropriate.  Objective:  BP 158/88 mmHg  Pulse 70  Wt 198 lb (89.812 kg)  SpO2 95% Vitals and nursing note reviewed  Physical Exam  Constitutional: He is well-developed, well-nourished, and in no distress.  Cardiovascular: Normal rate, regular rhythm and intact distal pulses.   Pulmonary/Chest: Effort normal and breath sounds normal. No respiratory distress. He exhibits no tenderness.  Skin: He is not diaphoretic.    Assessment & Plan:  1. Chest pain, unspecified chest pain type Concern for  ACS. EKG with new ST depressions and T wave inversions in anterolateral leads that is new from his EKG in March 2017. Also with prolonged QTc. HEART score already >7 without having a troponin. This puts patient in the highly suspicious category. Patient is stable and well-appearing at this time.   In clinic patient given morphine, ASA, Nitro x2, and placed on oxygen. IV placed. Called EMS to come take patient to ER for further evaluation and treatment; possible PCI.  Also discussed with ED charge nurse who is aware patient is coming. With informed consent patient's daughter called and informed.   Precepted with attending Dr. Doyne Keel, DO 04/09/2016, 10:08 AM PGY-3, [place]

## 2016-04-09 NOTE — ED Notes (Signed)
Per EMS, patient came from his doctors office.   Patient had stated he started having chest pains on Friday of last week.  They are intermittent.   Patient states only laying down helps with the pain.   Patient states some shortness of breath with the pain.   Denies N/V/D, or other symptoms.

## 2016-04-09 NOTE — Patient Instructions (Signed)
Going to ER

## 2016-04-09 NOTE — ED Provider Notes (Signed)
PROGRESS NOTE                                                                                                                 This is a sign-out from [place] at shift change: [name] is a 60 y.o. male presenting with CP. Plan is to follow-up cardiology recommendations. Please refer to previous note for full HPI, ROS, PMH and PE.   Cardiology has seen this patient, would like a family practice admission to trend the troponins.  Discussed with resident Dr. Jerline Pain who accepts admission for attending Dr. Andria Frames. Holding orders placed.   Monico Blitz, PA-C 04/09/16 1945  Merrily Pew, MD 04/10/16 1224

## 2016-04-09 NOTE — Progress Notes (Signed)
   Patient in nurse clinic for blood pressure.  Patient was complaining of chest pain, SOB and dizziness with standing.  Patient reported that chest pain started over the weekend.  Per patient, it was the worse he felt.  He just laid around on the couch the whole weekend.  He did not want to eat anything.  Patient stated "felt like someone is squeezing my heart with their hand."  Patient reported taking Nitro this weekend without any relief.  Patient visit was transferred over to a provider visit for further evaluation.  Derl Barrow, RN

## 2016-04-09 NOTE — ED Notes (Signed)
Lab states they will add on CKMB

## 2016-04-09 NOTE — Consult Note (Signed)
CARDIOLOGY CONSULT NOTE   Patient ID: [name] MRN: [id number] DOB/AGE: September 03, 1956 60 y.o.  Admit date: 04/09/2016  Primary Physician   Asiyah Criss Rosales, MD Primary Cardiologist   Dr Haroldine Laws Reason for Consultation   Chest pain Requesting MD: [place]. [place], [place]  [name] is a 60 y.o. year old male with a history of HTN, daily EtOH and previous diagnosis of biventricular heart failure with EF 15% with recovered EF, chronic atypical CP.   01/18/2016, seen in CHF clinic (first time since 2014). Drinking less, but had not taken meds in >2 years. Echo w/ EF now 35-40% w/ grade 2 dd, med and ov compliance emphasized.   Saw Fam Med RN today and pt c/o CP. Changed to MD visit and pt evaluated then sent to ER. Cards asked to see.   Pt has been having L chest pain for 10 years. The pain is constant at a 3-4/10. It is never a 0/10. This is not related to exertion and not associated with meals. It is worse lying down, better at an incline, not necessarily better leaning forward. There is no radiation. At its usual level, it is not associated with SOB, N&V or diaphoresis.     About once/month, it will increase to a 6-7/10. This past Friday, it went to >10/10. It has never been this bad before. It was constant at that level. He was miserable all weekend. No real change with any of his meds or actions. He tried SL NTG without relief.   When he went to the IM office today, the pain was a 6-7/10. It is a sqeezing pain. Since in the ER, he has gotten Lasix 40 mg, no change in his pain.  His weight has been running about the same. He does not weigh regularly, but does get on the scales 1-2 x week. No LE edema, no orthopnea, no PND. Can do pretty much what he wants. Activity level is not high, but has not worsened recently.   Past Medical History  Diagnosis Date  . Hypertension   . Psoriasis     Possible psoriasis in the past  . Alcohol abuse   . NICM (nonischemic cardiomyopathy)  ([place]) 09/04/2012  . Chronic systolic heart failure ([place]) 06/02/2012  . Chronic combined systolic and diastolic heart failure, NYHA class 3 ([place]) 06/02/2012     Past Surgical History  Procedure Laterality Date  . Skin graft      L arm got caught in machine in 1979  . Left and right heart catheterization with coronary angiogram N/A 05/25/2012    Procedure: LEFT AND RIGHT HEART CATHETERIZATION WITH CORONARY ANGIOGRAM;  Surgeon: Jolaine Artist, MD;  Location: [facility] CATH LAB;  Service: Cardiovascular;  Laterality: N/A;    No Known Allergies  I have reviewed the patient's current medications Prior to Admission medications   Medication Sig Start Date End Date Taking? Authorizing Provider  aspirin 81 MG EC tablet TAKE 1 TABLET (81 MG TOTAL) BY MOUTH DAILY. 12/08/15  Yes Rosemarie Ax, MD  carvedilol (COREG) 12.5 MG tablet Take 1 tablet (12.5 mg total) by mouth 2 (two) times daily with a meal. MUST HAVE FOLLOW UP APPOINTMENT FOR FURTHER REFILLS 03/22/16  Yes Lupita Dawn, MD  lisinopril (PRINIVIL,ZESTRIL) 20 MG tablet Take 1 tablet (20 mg total) by mouth daily. 01/18/16  Yes Shaune Pascal Bensimhon, MD  nitroGLYCERIN (NITROSTAT) 0.4 MG SL tablet PLACE 1 TABLET UNDER THE TONGUE EVERY 5 MINUTES AS NEEDED  FOR CHEST PAIN. 12/08/15  Yes Rosemarie Ax, MD  triamcinolone cream (KENALOG) 0.5 % Apply 1 application topically daily as needed. 04/03/16  Yes Leeanne Rio, MD  furosemide (LASIX) 40 MG tablet Take 1 tablet (40 mg total) by mouth 2 (two) times daily. Patient not taking: Reported on 04/03/2016 12/08/15   Rosemarie Ax, MD     Social History   Social History  . Marital Status: Legally Separated    Spouse Name: N/A  . Number of Children: N/A  . Years of Education: N/A   Occupational History  . Not on file.   Social History Main Topics  . Smoking status: Former Smoker    Types: Cigars  . Smokeless tobacco: Not on file     Comment: Rare cigar (black-n-milds)  . Alcohol Use: 0.0 oz/week     0 Standard drinks or equivalent per week     Comment: 3-4 12oz beers per day  . Drug Use: No  . Sexual Activity: Not on file   Other Topics Concern  . Not on file   Social History Narrative   Works as a Administrator.    Family Status  Relation Status Death Age  . Father Deceased    Family History  Problem Relation Age of Onset  . Heart failure Father   . Heart failure Other     Uncle. Possible MI?     ROS:  Full 14 point review of systems complete and found to be negative unless listed above.  Physical Exam: Blood pressure 188/96, pulse 66, temperature 98.5 F (36.9 C), temperature source Oral, resp. rate 20, SpO2 93 %.  General: Well developed, well nourished, male in no acute distress Head: Eyes PERRLA, No xanthomas.   Normocephalic and atraumatic, oropharynx without edema or exudate. Dentition: poor Lungs: decreased BS bases with some rales Heart: HRRR S1 S2, no rub/gallop, no murmur. pulses are 2+ all 4 extrem.   Neck: No carotid bruits. No lymphadenopathy.  JVD not elevated Abdomen: Bowel sounds present, abdomen soft and non-tender without masses or hernias noted. Msk:  No spine or cva tenderness. No weakness, no joint deformities or effusions. Extremities: No clubbing or cyanosis. No edema.  Neuro: Alert and oriented X 3. No focal deficits noted. Psych:  Good affect, responds appropriately Skin: No rashes or lesions noted.  Labs:   Lab Results  Component Value Date   WBC 7.6 04/09/2016   HGB 13.5 04/09/2016   HCT 40.5 04/09/2016   MCV 87.9 04/09/2016   PLT 241 04/09/2016     Recent Labs Lab 04/09/16 1107  NA 135  K 3.9  CL 102  CO2 25  BUN 16  CREATININE 1.48*  CALCIUM 8.6*  GLUCOSE 106*   Lab Results  Component Value Date   CKTOTAL 103 04/09/2016   CKMB 2.4 04/09/2016   TROPONINI <0.30 05/21/2012   B NATRIURETIC PEPTIDE  Date/Time Value Ref Range Status  04/09/2016 11:07 AM 1299.2* 0.0 - 100.0 pg/mL Final   TSH  Date/Time Value Ref  Range Status  12/08/2015 02:52 PM 1.26 0.40 - 4.50 mIU/L Final   Echo: - Left ventricle: The cavity size was normal. Wall thickness was  normal. Systolic function was moderately reduced. The estimated  ejection fraction was in the range of 35% to 40%. Diffuse  hypokinesis. There is akinesis of the anteroseptal and  inferoseptal myocardium. Features are consistent with a  pseudonormal left ventricular filling pattern, with concomitant  abnormal relaxation and increased filling pressure (  grade 2  diastolic dysfunction). - Aortic valve: Trileaflet; mildly thickened leaflets. - Mitral valve: There was mild regurgitation. - Left atrium: The atrium was mildly dilated. - Right ventricle: The cavity size was moderately dilated. Wall  thickness was normal. Impressions: - When compared to prior, EF is reduced (prior 50%)  ECG:  03/10/2016 SR, LVH with strain Minor changes from 11/2015  Cath: 04/2012 Findings: RA = 7 RV = 56/3/10 PA = 52/23 (34) PCW = 17 v= 25 Fick cardiac output/index = 7.8/3.9 Thermo CO/CI = 6.1/3.0 PVR = 2.1 [place] SVR = 1194  FA sat = 97% PA sat = 79%, 80% High SVC = 79% PA sat = Ao Pressure: 159/100 (126) LV Pressure: 151/9/20 There was no signficant gradient across the aortic valve on pullback. Left main: Normal LAD: Mild plaque proximally otherwise normal LCX: Normal RCA: Dominant. Normal LV-gram done in the RAO projection: Ejection fraction = 15%. Severe global hypokinesis Assessment: 1. Minimal non-obstructive CAD 2. Severe LV dysfunction due to NICM 3. Relatively well compensated filling pressures  4. High cardiac output with no evidence of intracardiac shunt Plan/Discussion: Coronaries are normal. Hemodynamics look pretty good despite severe non-ischemic cardiomyopathy (suspect hypertensive in nature). Cardiac output is high but no evidence of intracardiac shunting. Possible d/c home tomorrow with close f/u in the HF clinic. Will titrate  hydralazine for HTN.  Radiology:  Dg Chest 2 View 04/09/2016  CLINICAL DATA:  LEFT side squeezing chest pain, shortness of breath, loss of appetite and dry cough for 3 days, history non ischemic cardiomyopathy, chronic systolic heart failure, hypertension EXAM: CHEST  2 VIEW COMPARISON:  04/13/2013 FINDINGS: Enlargement of cardiac silhouette with pulmonary vascular congestion. Mediastinal contours normal. Chronic elevation of LEFT diaphragm with increased LEFT basilar atelectasis versus developing infiltrate. No additional infiltrate, pleural effusion or pneumothorax. Bones unremarkable. IMPRESSION: Enlargement of cardiac silhouette with pulmonary vascular congestion. Increased atelectasis versus question developing infiltrate at LEFT lung base. Electronically Signed   By: Lavonia Dana M.D.   On: 04/09/2016 11:44    ASSESSMENT AND PLAN:   The patient was seen today by Dr Tamala Julian, the patient evaluated and the data reviewed.   Principal Problem: 1.  Left sided chest pain - has been hurting at a 10/10 for over 48 hours without ez elevation.  - worsening of his chronic pain that has not been a 0/10 in 10 years - clean cath 2013 - ECG is slightly different from 11/2015, but may be all related to LVH - will order morphine till pt seen by primary team and more definitive pain management can be pursued.  - CKMB normal, not sure if other inflammatory markers would help.  Active Problems: 2.  Hypertension - BP is very high, will leave to FM  3.  Renal insufficiency - chronic, per FM  4.  Chronic combined systolic and diastolic heart failure, NYHA class 3 (HCC) - no recent weight change - no edema, no JVD - however, BNP is elevated (no old in system) and CXR with vascular congestion - received Lasix 40 mg IV in ER - follow exam, sx   5.  NICM (nonischemic cardiomyopathy) ([place]) - EF 35-40% by recent echo - on ACE/BB - continue these, titrate for better BP control  Plan: FM to admit, have ordered  cyclic troponins. If they remain negative or mildly elevated, doubt ischemic eval indicated.  BP control may help symptoms, will leave BP control, pain management, and other issues to the [facility] Medicine team.   Signed:  Lenoard Aden 04/09/2016 6:27 PM Beeper [id number]  Co-Sign MD The patient has been seen in conjunction with Rosaria Ferries, PAC. All aspects of care have been considered and discussed. The patient has been personally interviewed, examined, and all clinical data has been reviewed.   ER visit initiated by PCP who became concerned about ECG appearance. No significant cheat pain but elevated BP and BNP.  Plan admit by PCP with serial cardiac markers and diuresis as our only recommendation. If elevated markers, may need ischemic w/u. Most recent cath from 2014 showed no evidence of obstructive CAD.  We will follow.

## 2016-04-09 NOTE — ED Provider Notes (Signed)
CSN: [id number]     Arrival date & time 04/09/16  1055 History   First MD Initiated Contact with Patient 04/09/16 1056     Chief Complaint  Patient presents with  . Chest Pain     (Consider location/radiation/quality/duration/timing/severity/associated sxs/prior Treatment) HPI Comments: Patient with past medical history remarkable for hypertension, CHF, and nonischemic cardiomyopathy presents to the emergency department with chief complaint of chest pain. He states that he has had left-sided chest pain since Friday.  He states that it has progressively worsened throughout the weekend. He complains of associated shortness of breath, but denies any nausea, vomiting, diaphoresis, radiating pain. He denies any lower extremity swelling. He states that he had some improvement with the nitroglycerin and aspirin given him by EMS. He denies any measured fevers. Denies any productive cough.  Patient seen by family practice service this morning, and advised to come to the emergency department after discovering new EKG changes including ST depressions and T-wave inversions which are new since March.  The history is provided by the patient. No language interpreter was used.    Past Medical History  Diagnosis Date  . Hypertension   . Psoriasis     Possible psoriasis in the past  . Alcohol abuse   . NICM (nonischemic cardiomyopathy) ([place]) 09/04/2012  . Chronic systolic heart failure ([place]) 06/02/2012   Past Surgical History  Procedure Laterality Date  . Skin graft      L arm got caught in machine in 1979  . Left and right heart catheterization with coronary angiogram N/A 05/25/2012    Procedure: LEFT AND RIGHT HEART CATHETERIZATION WITH CORONARY ANGIOGRAM;  Surgeon: Jolaine Artist, MD;  Location: [facility] CATH LAB;  Service: Cardiovascular;  Laterality: N/A;   Family History  Problem Relation Age of Onset  . Heart failure Father   . Heart failure Other     Uncle. Possible MI?   Social History   Substance Use Topics  . Smoking status: Former Smoker    Types: Cigars  . Smokeless tobacco: None     Comment: Rare cigar (black-n-milds)  . Alcohol Use: 0.0 oz/week    0 Standard drinks or equivalent per week     Comment: 3-4 12oz beers per day    Review of Systems  Respiratory: Positive for shortness of breath.   Cardiovascular: Positive for chest pain.  All other systems reviewed and are negative.     Allergies  Review of patient's allergies indicates no known allergies.  Home Medications   Prior to Admission medications   Medication Sig Start Date End Date Taking? Authorizing Provider  aspirin 81 MG EC tablet TAKE 1 TABLET (81 MG TOTAL) BY MOUTH DAILY. 12/08/15   Rosemarie Ax, MD  carvedilol (COREG) 12.5 MG tablet Take 1 tablet (12.5 mg total) by mouth 2 (two) times daily with a meal. MUST HAVE FOLLOW UP APPOINTMENT FOR FURTHER REFILLS 03/22/16   Lupita Dawn, MD  furosemide (LASIX) 40 MG tablet Take 1 tablet (40 mg total) by mouth 2 (two) times daily. Patient not taking: Reported on 04/03/2016 12/08/15   Rosemarie Ax, MD  lisinopril (PRINIVIL,ZESTRIL) 20 MG tablet Take 1 tablet (20 mg total) by mouth daily. 01/18/16   Shaune Pascal Bensimhon, MD  nitroGLYCERIN (NITROSTAT) 0.4 MG SL tablet PLACE 1 TABLET UNDER THE TONGUE EVERY 5 MINUTES AS NEEDED FOR CHEST PAIN. 12/08/15   Rosemarie Ax, MD  triamcinolone cream (KENALOG) 0.5 % Apply 1 application topically daily as needed. 04/03/16  Leeanne Rio, MD   BP 178/100 mmHg  Pulse 68  Temp(Src) 98.5 F (36.9 C) (Oral)  Resp 17  SpO2 95% Physical Exam  Constitutional: He is oriented to person, place, and time. He appears well-developed and well-nourished.  HENT:  Head: Normocephalic and atraumatic.  Eyes: Conjunctivae and EOM are normal. Pupils are equal, round, and reactive to light. Right eye exhibits no discharge. Left eye exhibits no discharge. No scleral icterus.  Neck: Normal range of motion. Neck supple. No JVD  present.  Cardiovascular: Normal rate, regular rhythm and normal heart sounds.  Exam reveals no gallop and no friction rub.   No murmur heard. Pulmonary/Chest: Effort normal and breath sounds normal. No respiratory distress. He has no wheezes. He has no rales. He exhibits no tenderness.  Abdominal: Soft. He exhibits no distension and no mass. There is no tenderness. There is no rebound and no guarding.  Musculoskeletal: Normal range of motion. He exhibits no edema or tenderness.  Neurological: He is alert and oriented to person, place, and time.  Skin: Skin is warm and dry.  Psychiatric: He has a normal mood and affect. His behavior is normal. Judgment and thought content normal.  Nursing note and vitals reviewed.   ED Course  Procedures (including critical care time) Results for orders placed or performed during the hospital encounter of 04/09/16  CBC with Differential/Platelet  Result Value Ref Range   WBC 7.6 4.0 - 10.5 K/uL   RBC 4.61 4.22 - 5.81 MIL/uL   Hemoglobin 13.5 13.0 - 17.0 g/dL   HCT 40.5 39.0 - 52.0 %   MCV 87.9 78.0 - 100.0 fL   MCH 29.3 26.0 - 34.0 pg   MCHC 33.3 30.0 - 36.0 g/dL   RDW 14.0 11.5 - 15.5 %   Platelets 241 150 - 400 K/uL   Neutrophils Relative % 68 %   Neutro Abs 5.2 1.7 - 7.7 K/uL   Lymphocytes Relative 14 %   Lymphs Abs 1.1 0.7 - 4.0 K/uL   Monocytes Relative 14 %   Monocytes Absolute 1.1 (H) 0.1 - 1.0 K/uL   Eosinophils Relative 4 %   Eosinophils Absolute 0.3 0.0 - 0.7 K/uL   Basophils Relative 0 %   Basophils Absolute 0.0 0.0 - 0.1 K/uL  Basic metabolic panel  Result Value Ref Range   Sodium 135 135 - 145 mmol/L   Potassium 3.9 3.5 - 5.1 mmol/L   Chloride 102 101 - 111 mmol/L   CO2 25 22 - 32 mmol/L   Glucose, Bld 106 (H) 65 - 99 mg/dL   BUN 16 6 - 20 mg/dL   Creatinine, Ser 1.48 (H) 0.61 - 1.24 mg/dL   Calcium 8.6 (L) 8.9 - 10.3 mg/dL   GFR calc non Af Amer 49 (L) >60 mL/min   GFR calc Af Amer 57 (L) >60 mL/min   Anion gap 8 5 - 15    Dg Chest 2 View  04/09/2016  CLINICAL DATA:  LEFT side squeezing chest pain, shortness of breath, loss of appetite and dry cough for 3 days, history non ischemic cardiomyopathy, chronic systolic heart failure, hypertension EXAM: CHEST  2 VIEW COMPARISON:  04/13/2013 FINDINGS: Enlargement of cardiac silhouette with pulmonary vascular congestion. Mediastinal contours normal. Chronic elevation of LEFT diaphragm with increased LEFT basilar atelectasis versus developing infiltrate. No additional infiltrate, pleural effusion or pneumothorax. Bones unremarkable. IMPRESSION: Enlargement of cardiac silhouette with pulmonary vascular congestion. Increased atelectasis versus question developing infiltrate at LEFT lung base. Electronically Signed  By: Lavonia Dana M.D.   On: 04/09/2016 11:44    I have personally reviewed and evaluated these images and lab results as part of my medical decision-making.   EKG Interpretation None      MDM   Final diagnoses:  Chest pain, unspecified chest pain type   Patient sent to ED by family practice office today over concern for left-sided chest pain 3 days as well as new ST depressions and T-wave inversions on EKG when compared to March this year. Patient follows with Dr. Tempie Hoist for CHF.  He denies any lower extremity swelling or productive cough. He states that he has had some shortness of breath.   Patient discussed with Dr. Ashok Cordia, who recommends cardiology consultation.  Trop is 0.02, but not crossing in epic.  Cardiology to see patient.  Montine Circle, PA-C 04/10/16 [id number]  Lajean Saver, MD 04/12/16 [id number]

## 2016-04-10 DIAGNOSIS — I429 Cardiomyopathy, unspecified: Secondary | ICD-10-CM | POA: Diagnosis not present

## 2016-04-10 DIAGNOSIS — Z87891 Personal history of nicotine dependence: Secondary | ICD-10-CM | POA: Diagnosis not present

## 2016-04-10 DIAGNOSIS — N183 Chronic kidney disease, stage 3 (moderate): Secondary | ICD-10-CM | POA: Diagnosis not present

## 2016-04-10 DIAGNOSIS — G8929 Other chronic pain: Secondary | ICD-10-CM | POA: Diagnosis not present

## 2016-04-10 DIAGNOSIS — I130 Hypertensive heart and chronic kidney disease with heart failure and stage 1 through stage 4 chronic kidney disease, or unspecified chronic kidney disease: Secondary | ICD-10-CM | POA: Diagnosis not present

## 2016-04-10 DIAGNOSIS — R0602 Shortness of breath: Secondary | ICD-10-CM | POA: Diagnosis not present

## 2016-04-10 DIAGNOSIS — R0789 Other chest pain: Secondary | ICD-10-CM | POA: Diagnosis not present

## 2016-04-10 DIAGNOSIS — R079 Chest pain, unspecified: Secondary | ICD-10-CM | POA: Diagnosis not present

## 2016-04-10 DIAGNOSIS — M109 Gout, unspecified: Secondary | ICD-10-CM | POA: Diagnosis not present

## 2016-04-10 DIAGNOSIS — I10 Essential (primary) hypertension: Secondary | ICD-10-CM | POA: Diagnosis not present

## 2016-04-10 DIAGNOSIS — J9811 Atelectasis: Secondary | ICD-10-CM | POA: Diagnosis not present

## 2016-04-10 DIAGNOSIS — I5042 Chronic combined systolic (congestive) and diastolic (congestive) heart failure: Secondary | ICD-10-CM | POA: Diagnosis not present

## 2016-04-10 DIAGNOSIS — Z7982 Long term (current) use of aspirin: Secondary | ICD-10-CM | POA: Diagnosis not present

## 2016-04-10 LAB — LIPID PANEL
CHOL/HDL RATIO: 6 ratio
CHOLESTEROL: 161 mg/dL (ref 0–200)
HDL: 27 mg/dL — ABNORMAL LOW (ref >40)
LDL Cholesterol: 111 mg/dL — ABNORMAL HIGH (ref 0–99)
Triglycerides: 117 mg/dL (ref <150)
VLDL: 23 mg/dL (ref 0–40)

## 2016-04-10 LAB — HEMOGLOBIN A1C
Hgb A1c MFr Bld: 5.6 % (ref 4.8–5.6)
MEAN PLASMA GLUCOSE: 114 mg/dL

## 2016-04-10 LAB — TROPONIN I

## 2016-04-10 MED ORDER — FUROSEMIDE 40 MG PO TABS: 40.0000 mg | ORAL_TABLET | Freq: Two times a day (BID) | ORAL | Status: DC

## 2016-04-10 MED ORDER — FUROSEMIDE 10 MG/ML IJ SOLN
40.0000 mg | INTRAMUSCULAR | Status: AC
  Administered 2016-04-10: 40 mg via INTRAVENOUS
  Filled 2016-04-10: qty 4

## 2016-04-10 MED FILL — FUROSEMIDE 40 MG TABLET: 40 | 30 days supply | Qty: 60 | Fill #0

## 2016-04-10 NOTE — Care Management Obs Status (Signed)
[place] NOTIFICATION   Patient Details  Name: Jacody Capito MRN: [id number] Date of Birth: 31-Dec-1955   Medicare Observation Status Notification Given:  Yes    Bethena Roys, RN 04/10/2016, 5:35 PM

## 2016-04-10 NOTE — Progress Notes (Signed)
Family Medicine Teaching Service Daily Progress Note Intern Pager: [phone]  Patient name: [name] Medical record number: [id number] Date of birth: May 17, 1956 Age: 60 y.o. Gender: male  Primary Care Provider: Lockie Pares, MD Consultants: Cardiology Code Status: FULL  Pt Overview and Major Events to Date:  7/12 admitted to inpatient, cardiology following  Assessment and Plan:  Chest Pain. Concern for cardiac etiology. Initial troponin negative. EKG with nonspecific T wave inversions. Heart score 3-4. Aspirin, nitroglycerin, and morphine given at [facility]. CXR with atelectasis in LLL base, but no other clinical signs of PNA. Wells score 0. May be a progression/acute exacerbation of patient's chronic chest pain. Normal cath in 2013. Troponin 0.03>0.03>0.03  LDL: 27, TSH: 3.4.  Pain was 8/10 today and was given tylenol without relief.  - Cardiology following, will not pursue any ischemic workup, will plan to d/c - Telemetry - Trend troponin x3 - f/u A1c  Hypertension. 190s/90s on admission. On coreg and lisinopril at home. SBP in the 170s this evening.  - Resume home meds, increase as tolerated - IV hydralazine 5mg  prn SBP>180  HFrEF, NICM. Last echo with EF 35-40% EF three months ago. Has been as low as 15% EF four years ago. Does not appear clinically overloaded, though has some possible pulmonary edema on CXR. BNP elevated, baseline unknown. Weight stable over the past 3-4 months.  RLL crackles on exam.  RLL crackles heard, given 40mg  IV lasix this AM.   - I/Os, daily weights, IV lasix as appropriate.   CKD 3A. Cr 1.48 on admission. Baseline 1.5-1.6 - Continue home lisinopril - Trend BMP - Avoid nephrotoxic medications  FEN/GI: Heart healthy diet, SLIV Prophylaxis: Lovenox  Disposition: Pending above management  Subjective:  Patient continues to endorse chest pain.  He says that it feels like his heart is being squeezed.  Endorses some shortness of breath as well.    Objective: Temp:  [98.1 F (36.7 C)-98.6 F (37 C)] 98.3 F (36.8 C) (07/12 0745) Pulse Rate:  [57-77] 77 (07/12 1000) Resp:  [14-22] 22 (07/12 1439) BP: (153-190)/(82-107) 166/85 mmHg (07/12 1439) SpO2:  [87 %-97 %] 95 % (07/12 1000) Weight:  [190 lb 3.2 oz (86.274 kg)-190 lb 4.8 oz (86.32 kg)] 190 lb 3.2 oz (86.274 kg) (07/12 0403) Physical Exam: General: middle aged gentleman in NAD, lying in bed Cardiovascular: EOMI, PERRLA Respiratory: CTABL, except for RLL crackles, normal work of breathing Abdomen: soft, non-tender, non-distended,  Extremities: warm and well perfused MSK:  No tender, or edema Psych: normal mood and affect Neuro: AAOx3  Laboratory:  Recent Labs Lab 04/09/16 1107 04/09/16 2224  WBC 7.6 7.4  HGB 13.5 14.0  HCT 40.5 42.1  PLT 241 232    Recent Labs Lab 04/09/16 1107 04/09/16 2224  NA 135  --   K 3.9  --   CL 102  --   CO2 25  --   BUN 16  --   CREATININE 1.48* 1.36*  CALCIUM 8.6*  --   GLUCOSE 106*  --       Imaging/Diagnostic Tests: No results found.  Eloise Levels, MD 04/10/2016, 5:30 PM PGY-1, [place] Intern pager: [phone], text pages welcome

## 2016-04-10 NOTE — Discharge Summary (Signed)
[facility] Discharge Summary  Patient name: Kainon Dooney Medical record number: [id number] Date of birth: 04/28/1956 Age: 60 y.o. Gender: male Date of Admission: 04/09/2016  Date of Discharge: 04/10/16 Admitting Physician: Zenia Resides, MD  Primary Care Provider: Lockie Pares, MD Consultants: Cardiology  Indication for Hospitalization: Chest pain  Discharge Diagnoses/Problem List:  Patient Active Problem List   Diagnosis Date Noted  . Pain in the chest   . Left sided chest pain 04/09/2016  . Gout 03/22/2016  . Poor dentition 05/08/2014  . Syncope 05/29/2013  . Chest pain 04/16/2013  . Pulmonary nodule, right 11/03/2012  . Dyspnea 11/03/2012  . Substernal chest pain 10/14/2012  . Chronic eczema 10/06/2012  . NICM (nonischemic cardiomyopathy) ([place]) 09/04/2012  . Chronic combined systolic and diastolic heart failure, NYHA class 3 ([place]) 06/02/2012  . Hypertension 05/21/2012  . Renal insufficiency 05/21/2012  . Alcohol abuse 05/21/2012  . Cardiac LV ejection fraction 10-20% 05/21/2012     Disposition: Home  Discharge Condition: Stable, improved  Discharge Exam:  General: middle aged gentleman in NAD, lying in bed Cardiovascular: EOMI, PERRLA Respiratory: CTABL, except for RLL crackles, normal work of breathing Abdomen: soft, non-tender, non-distended,  Extremities: warm and well perfused MSK: No tender, or edema Psych: normal mood and affect Neuro: AAOx3  Brief Hospital Course:  Chest Pain: Patient's symptoms started 4 days prior to admission when he noted an acute worsening of his chest pain, ontop of his chronic chest pain. There was a concern for cardiac etiology and his initial troponins were negative.  He had an EKG with non-specific t-wave inversion.  Heart score was 3-4.  Aspirin, nitro and morphine given at the [facility].  Low suspicion for PNA, CXR clear, aside from the LLL atelectasis and wells score 0.  O  Concern for cardiac  etiology. Initial troponin negative. EKG with nonspecific T wave inversions. Heart score 3-4. Aspirin, nitroglycerin, and morphine given at [facility]. CXR with atelectasis in LLL base, but no other clinical signs of PNA. Wells score 0.  Patient had a normal cath in 2013.  Seen by cardiology who determined they would not proceed with an ischemic workup.  It was determined that this was an acute exacerbation of his chronic chest pain.  We scheduled a follow up appointment with his Cardiologist, Dr. Haroldine Laws and recommended that he continues his lasix 40mg  BID and other home medications.   Hypertension. 190s/90s on admission. Asymptomatic, no CP, SOB, visual changes.  Was given IV hydralazine 5mg  for SBP >180.  On coreg and lisinopril at home, unclear if patient is compliant. Patient to resume home meds upon discharge and to follow up with primary care doctor.   HFrEF, NICM. Last echo with EF 35-40% EF three months ago. Has been as low as 15% EF four years ago. Does not appear clinically overloaded, though has some possible pulmonary edema on CXR. BNP elevated, baseline unknown. Weight stable over the past 3-4 months. LLL crackles on exam, given 40mg  IV lasix with good UOP. Patient discharged with lasix 40mg  BID and asked to follow up with cardiologist and primary care doctor.    Issues for Follow Up:  1. Follow up with Cardiology on 04/30/16 at 12:00PM for a hospital follow up for chest pain and for chronic issues. 2. Follow up with primary care doctor for general hospital follow up-- patient will call.   Significant Procedures: None  Significant Labs and Imaging:   Recent Labs Lab 04/09/16 1107 04/09/16 2224  WBC 7.6 7.4  HGB 13.5 14.0  HCT 40.5 42.1  PLT 241 232    Recent Labs Lab 04/09/16 1107 04/09/16 2224  NA 135  --   K 3.9  --   CL 102  --   CO2 25  --   GLUCOSE 106*  --   BUN 16  --   CREATININE 1.48* 1.36*  CALCIUM 8.6*  --     Results/Tests Pending at Time of Discharge:  None  Discharge Medications:    Medication List    STOP taking these medications        triamcinolone cream 0.5 %  Commonly known as:  KENALOG      TAKE these medications        aspirin 81 MG EC tablet  TAKE 1 TABLET (81 MG TOTAL) BY MOUTH DAILY.     carvedilol 12.5 MG tablet  Commonly known as:  COREG  Take 1 tablet (12.5 mg total) by mouth 2 (two) times daily with a meal. MUST HAVE FOLLOW UP APPOINTMENT FOR FURTHER REFILLS     furosemide 40 MG tablet  Commonly known as:  LASIX  Take 1 tablet (40 mg total) by mouth 2 (two) times daily.     furosemide 40 MG tablet  Commonly known as:  LASIX  Take 1 tablet (40 mg total) by mouth 2 (two) times daily.     lisinopril 20 MG tablet  Commonly known as:  PRINIVIL,ZESTRIL  Take 1 tablet (20 mg total) by mouth daily.     nitroGLYCERIN 0.4 MG SL tablet  Commonly known as:  NITROSTAT  PLACE 1 TABLET UNDER THE TONGUE EVERY 5 MINUTES AS NEEDED FOR CHEST PAIN.        Discharge Instructions: Please refer to Patient Instructions section of EMR for full details.  Patient was counseled important signs and symptoms that should prompt return to medical care, changes in medications, dietary instructions, activity restrictions, and follow up appointments.   Follow-Up Appointments: Follow-up Information    Follow up with Asiyah Criss Rosales, MD. Schedule an appointment as soon as possible for a visit in 1 week.   Specialty:  Family Medicine   Why:  For hospital follow-up    Contact information:   [place] [address] [phone]       Follow up with Glori Bickers, MD. Go on 04/30/2016.   Specialty:  Cardiology   Why:  For hospital follow-up at 12 PM   Contact information:   [place] [address] [phone]       Eloise Levels, MD 04/12/2016, 10:33 PM PGY-1, [place]

## 2016-04-10 NOTE — Discharge Instructions (Signed)
You were admitted for chest pain. You were evaluated by a cardiologist (heart doctor), and they found no new problems with your heart. You can continue to take Tylenol as needed for your pain.   Please take your Lasix (furosemide or water pills) 40 mg twice a day every day.   Please follow up with your regular doctor (Dr. Emmaline Life) within the next week to make sure you are improving.

## 2016-04-10 NOTE — Progress Notes (Addendum)
Subjective: Still with chest pain   Objective: Vital signs in last 24 hours: Temp:  [98.1 F (36.7 C)-98.6 F (37 C)] 98.3 F (36.8 C) (07/12 0745) Pulse Rate:  [57-74] 64 (07/12 0823) Resp:  [14-24] 19 (07/12 0823) BP: (156-196)/(87-120) 190/101 mmHg (07/12 0823) SpO2:  [87 %-97 %] 94 % (07/12 0823) Weight:  [190 lb 3.2 oz (86.274 kg)-190 lb 4.8 oz (86.32 kg)] 190 lb 3.2 oz (86.274 kg) (07/12 0403) Weight change:  Last BM Date: 04/08/16 Intake/Output from previous day: 1710 07/11 0701 - 07/12 0700 In: 240 [P.O.:240] Out: 1950 [Urine:1950] Intake/Output this shift:    PE: General:Pleasant affect, NAD Skin:Warm and dry, brisk capillary refill, + psoriasis  HEENT:normocephalic, sclera clear, mucus membranes moist Neck:supple, no JVD Heart:S1S2 RRR without murmur, gallup, rub or click Lungs:clear without rales, rhonchi, or wheezes [id number], non tender, + BS, do not palpate liver spleen or masses Ext:no lower ext edema, 2+ pedal pulses, 2+ radial pulses Neuro:alert and oriented X 3, MAE, follows commands, + facial symmetry    Tele:  SR EKG SR with LVH and continued T wave inversions.   Lab Results:  Recent Labs  04/09/16 1107 04/09/16 2224  WBC 7.6 7.4  HGB 13.5 14.0  HCT 40.5 42.1  PLT 241 232   BMET  Recent Labs  04/09/16 1107 04/09/16 2224  NA 135  --   K 3.9  --   CL 102  --   CO2 25  --   GLUCOSE 106*  --   BUN 16  --   CREATININE 1.48* 1.36*  CALCIUM 8.6*  --     Recent Labs  04/10/16 0356 04/10/16 0943  TROPONINI <0.03 <0.03    Lab Results  Component Value Date   CHOL 161 04/10/2016   HDL 27* 04/10/2016   LDLCALC 111* 04/10/2016   TRIG 117 04/10/2016   CHOLHDL 6.0 04/10/2016   Lab Results  Component Value Date   HGBA1C 5.7* 05/20/2012     Lab Results  Component Value Date   TSH 3.429 04/09/2016    Hepatic Function Panel No results for input(s): PROT, ALBUMIN, AST, ALT, ALKPHOS, BILITOT, BILIDIR, IBILI in the  last 72 hours.  Recent Labs  04/10/16 0356  CHOL 161   No results for input(s): PROTIME in the last 72 hours.     Studies/Results: Dg Chest 2 View  04/09/2016  CLINICAL DATA:  LEFT side squeezing chest pain, shortness of breath, loss of appetite and dry cough for 3 days, history non ischemic cardiomyopathy, chronic systolic heart failure, hypertension EXAM: CHEST  2 VIEW COMPARISON:  04/13/2013 FINDINGS: Enlargement of cardiac silhouette with pulmonary vascular congestion. Mediastinal contours normal. Chronic elevation of LEFT diaphragm with increased LEFT basilar atelectasis versus developing infiltrate. No additional infiltrate, pleural effusion or pneumothorax. Bones unremarkable. IMPRESSION: Enlargement of cardiac silhouette with pulmonary vascular congestion. Increased atelectasis versus question developing infiltrate at LEFT lung base. Electronically Signed   By: Lavonia Dana M.D.   On: 04/09/2016 11:44    Medications: I have reviewed the patient's current medications. Scheduled Meds: . aspirin EC  81 mg Oral Daily  . carvedilol  12.5 mg Oral BID WC  . enoxaparin (LOVENOX) injection  40 mg Subcutaneous QHS  . furosemide  40 mg Intravenous STAT  . lisinopril  20 mg Oral Daily   Continuous Infusions:  PRN Meds:.acetaminophen, gi cocktail, hydrALAZINE, ondansetron (ZOFRAN) IV  Assessment/Plan: Principal Problem:   Left sided chest pain has  been hurting at a 10/10 for over 48 hours without ez elevation.  - worsening of his chronic pain that has not been a 0/10 in 10 years - clean cath 2013 - ECG is slightly different from 11/2015, but may be all related to LVH - will order morphine till pt seen by primary team and more definitive pain management can be pursued. has only rec'd one dose though continued pain - asked nurse to check with primary team - CKMB normal, not sure if other inflammatory markers would help. --LDL is 111, HDL 27 - did not receive BB this AM  Has been NPO   checked with Dr. Tamala Julian no ischemia work up planned,   Active Problems:   Hypertension- BP elevated today 190/101 has not rec'd coreg this AM    Renal insufficiency- stable    Chronic combined systolic and diastolic heart failure, NYHA class 3 (HCC) - no recent weight change- wt stable today - no edema, no JVD - however, BNP is elevated (no old in system) and CXR with vascular congestion - received Lasix 40 mg IV in ER and is  -1710 - follow exam, sx     NICM (nonischemic cardiomyopathy) ([place]) EF 35-40% by recent echo - on ACE/BB - continue these, titrate for better BP control      LOS: 1 day   Time spent with pt. : 15 minutes. Cecilie Kicks  Nurse Practitioner Certified Pager [phone] or after 5pm and on weekends call [phone] 04/10/2016, 11:21 AM   The patient has been seen in conjunction with Cecilie Kicks, NP. All aspects of care have been considered and discussed. The patient has been personally interviewed, examined, and all clinical data has been reviewed.   Continues to have chest pain. Cardiac markers unremarkable.  ECG is compatible with LVH with strain.  Myocardial infarction is been excluded. No further cardiac evaluation is recommended at this time.  Needs better blood pressure control. This includes resuming his typical oral diuretic regimen of furosemide 40 mg twice a day. Furthermore, titrating heart failure therapy (beta blocker and ACE inhibitor) would be helpful for both heart failure and blood pressure control. Please see orders written today.  Please call if we may be of further assistance.

## 2016-04-11 ENCOUNTER — Other Ambulatory Visit: Payer: Self-pay | Admitting: Internal Medicine

## 2016-04-11 DIAGNOSIS — M109 Gout, unspecified: Secondary | ICD-10-CM

## 2016-04-11 DIAGNOSIS — L309 Dermatitis, unspecified: Secondary | ICD-10-CM

## 2016-04-11 MED ORDER — TRIAMCINOLONE ACETONIDE 0.5 % EX CREA: 1.0000 "application " | TOPICAL_CREAM | Freq: Every day | CUTANEOUS | Status: DC | PRN

## 2016-04-11 MED ORDER — COLCHICINE 0.6 MG PO TABS: ORAL_TABLET | ORAL | Status: DC

## 2016-04-11 MED FILL — COLCRYS 0.6 MG TABLET: 0.6 | 4 days supply | Qty: 10 | Fill #0

## 2016-04-11 NOTE — Telephone Encounter (Signed)
I have sent in prescription patient is requesting.

## 2016-04-11 NOTE — Telephone Encounter (Signed)
Pt is calling for a refill on his gout medication. He doesn't know the name of it but know he needs a refill. He is also wanting a refill on his Kenalog cream,. jw

## 2016-04-15 MED FILL — COLCRYS 0.6 MG TABLET: 0.6 | 4 days supply | Qty: 10 | Fill #1

## 2016-04-16 ENCOUNTER — Encounter: Payer: Self-pay | Admitting: Internal Medicine

## 2016-04-16 ENCOUNTER — Ambulatory Visit (INDEPENDENT_AMBULATORY_CARE_PROVIDER_SITE_OTHER): Payer: Medicare Other | Admitting: Internal Medicine

## 2016-04-16 VITALS — BP 150/90 | HR 94 | Ht 72.0 in | Wt 194.1 lb

## 2016-04-16 DIAGNOSIS — I429 Cardiomyopathy, unspecified: Secondary | ICD-10-CM | POA: Diagnosis not present

## 2016-04-16 DIAGNOSIS — I5022 Chronic systolic (congestive) heart failure: Secondary | ICD-10-CM | POA: Diagnosis not present

## 2016-04-16 DIAGNOSIS — R079 Chest pain, unspecified: Secondary | ICD-10-CM | POA: Diagnosis not present

## 2016-04-16 DIAGNOSIS — I428 Other cardiomyopathies: Secondary | ICD-10-CM

## 2016-04-16 MED ORDER — CARVEDILOL 25 MG PO TABS: 25.0000 mg | ORAL_TABLET | Freq: Two times a day (BID) | ORAL | Status: DC

## 2016-04-16 MED FILL — LISINOPRIL 20 MG TABLET: 20 | 30 days supply | Qty: 30 | Fill #2

## 2016-04-16 NOTE — Patient Instructions (Signed)
Medication Instructions: - Your physician has recommended you make the following change in your medication:  1) Increase coreg (carvedilol) to 25 mg one tablet by mouth twice daily ** you may take coreg (carvediolol 12.5 mg two tablets by mouth twice daily until you use up your current supply**  Labwork: - none  Procedures/Testing: - none  Follow-Up: - Dr. Caryl Comes will see you back as needed  - Follow up with Dr. Haroldine Laws as scheduled  Any Additional Special Instructions Will Be Listed Below (If Applicable).     If you need a refill on your cardiac medications before your next appointment, please call your pharmacy.

## 2016-04-16 NOTE — Progress Notes (Signed)
ELECTROPHYSIOLOGY CONSULT NOTE  Patient ID: [name], MRN: [id number], DOB/AGE: Apr 24, 1956 60 y.o. Admit date: (Not on file) Date of Consult: 04/16/2016  Primary Physician: Lockie Pares, MD Primary Cardiologist: DB Consulting Physician ?  Chief Complaint: Chest pain   HPI [name] is a 60 y.o. male  Seen for chest pain.  He was recently hospitalized in these records were reviewed. He was seen by Dr. Rebecca Eaton; his pain was not felt to be cardiac as it's been pretty much nonstop for dozen years. It waxes and wanes without specific aggravating factors. He notes that having rested over the last couple of days it seems better.  He has a history of a nonischemic cardiomyopathy and poorly controlled hypertension the problem itself aggravated by poor medication compliance He is a prior history of significant alcohol use; of late this is been much better.   8/13 EF 15% with dilated RV RA 8/13 LHC no obstructive coronary disease 7/14 echo EF 50-55% (confirming 11/13) 5/17 Echo EF 35-40%  He has mild shortness of breath. He has significant daytime somnolence. He does not have nocturnal dyspnea orthopnea or peripheral edema.  He's had no palpitations or syncope.  Past Medical History  Diagnosis Date  . Hypertension   . Psoriasis     Possible psoriasis in the past  . Alcohol abuse   . NICM (nonischemic cardiomyopathy) ([place]) 09/04/2012  . Chronic systolic heart failure ([place]) 06/02/2012  . Chronic combined systolic and diastolic heart failure, NYHA class 3 ([place]) 06/02/2012      Surgical History:  Past Surgical History  Procedure Laterality Date  . Skin graft      L arm got caught in machine in 1979  . Left and right heart catheterization with coronary angiogram N/A 05/25/2012    Procedure: LEFT AND RIGHT HEART CATHETERIZATION WITH CORONARY ANGIOGRAM;  Surgeon: Jolaine Artist, MD;  Location: [facility] CATH LAB;  Service: Cardiovascular;  Laterality: N/A;     Home Meds: Prior  to Admission medications   Medication Sig Start Date End Date Taking? Authorizing Provider  aspirin 81 MG EC tablet TAKE 1 TABLET (81 MG TOTAL) BY MOUTH DAILY. 12/08/15  Yes Rosemarie Ax, MD  carvedilol (COREG) 12.5 MG tablet Take 1 tablet (12.5 mg total) by mouth 2 (two) times daily with a meal. MUST HAVE FOLLOW UP APPOINTMENT FOR FURTHER REFILLS 03/22/16  Yes Lupita Dawn, MD  colchicine 0.6 MG tablet Take 3 tablets now. If symptoms persist take 1 tablet every 12 hours until symptoms resolve. 04/11/16  Yes Asiyah Cletis Media, MD  furosemide (LASIX) 40 MG tablet Take 1 tablet (40 mg total) by mouth 2 (two) times daily. 04/10/16  Yes Verner Mould, MD  lisinopril (PRINIVIL,ZESTRIL) 20 MG tablet Take 1 tablet (20 mg total) by mouth daily. 01/18/16  Yes Shaune Pascal Bensimhon, MD  nitroGLYCERIN (NITROSTAT) 0.4 MG SL tablet PLACE 1 TABLET UNDER THE TONGUE EVERY 5 MINUTES AS NEEDED FOR CHEST PAIN. 12/08/15  Yes Rosemarie Ax, MD  triamcinolone cream (KENALOG) 0.5 % Apply 1 application topically daily as needed. 04/11/16  Yes Asiyah Cletis Media, MD    Allergies: No Known Allergies  Social History   Social History  . Marital Status: Legally Separated    Spouse Name: N/A  . Number of Children: N/A  . Years of Education: N/A   Occupational History  . Not on file.   Social History Main Topics  . Smoking status: Former Smoker  Types: Cigars  . Smokeless tobacco: Not on file     Comment: Rare cigar (black-n-milds)  . Alcohol Use: 0.0 oz/week    0 Standard drinks or equivalent per week     Comment: 3-4 12oz beers per day  . Drug Use: No  . Sexual Activity: Not on file   Other Topics Concern  . Not on file   Social History Narrative   Works as a Administrator.     Family History  Problem Relation Age of Onset  . Heart failure Father   . Heart failure Other     Uncle. Possible MI?     ROS:  Please see the history of present illness.     All other systems reviewed and  negative.    Physical Exam: Blood pressure 150/90, pulse 94, height 6' (1.829 m), weight 194 lb 1.9 oz (88.052 kg), SpO2 96 %. General: Well developed, well nourished male in no acute distress. Head: Normocephalic, atraumatic, sclera non-icteric, no xanthomas, nares are without discharge. EENT: normal  Lymph Nodes:  none Neck: Negative for carotid bruits. JVD not elevated. Back:without scoliosis kyphosis Lungs: Clear bilaterally to auscultation without wheezes, rales, or rhonchi. Breathing is unlabored. Heart: RRR with S1 S2. No  murmur . No rubs, or gallops appreciated. Abdomen: Soft, non-tender, non-distended with normoactive bowel sounds. No hepatomegaly. No rebound/guarding. No obvious abdominal masses. Msk:  Strength and tone appear normal for age. Extremities: No clubbing or cyanosis. No  edema.  Distal pedal pulses are 2+ and equal bilaterally. Skin: Warm and Dry Vitiligo Neuro: Alert and oriented X 3. CN III-XII intact Grossly normal sensory and motor function . Psych:  Responds to questions appropriately with a normal affect.      Labs: Cardiac Enzymes No results for input(s): CKTOTAL, CKMB, TROPONINI in the last 72 hours. CBC Lab Results  Component Value Date   WBC 7.4 04/09/2016   HGB 14.0 04/09/2016   HCT 42.1 04/09/2016   MCV 88.3 04/09/2016   PLT 232 04/09/2016   PROTIME: No results for input(s): LABPROT, INR in the last 72 hours. Chemistry  Recent Labs Lab 04/09/16 1107 04/09/16 2224  NA 135  --   K 3.9  --   CL 102  --   CO2 25  --   BUN 16  --   CREATININE 1.48* 1.36*  CALCIUM 8.6*  --   GLUCOSE 106*  --    Lipids Lab Results  Component Value Date   CHOL 161 04/10/2016   HDL 27* 04/10/2016   LDLCALC 111* 04/10/2016   TRIG 117 04/10/2016   BNP PRO B NATRIURETIC PEPTIDE (BNP)  Date/Time Value Ref Range Status  04/13/2013 12:00 PM 321.3* 0 - 125 pg/mL Final  06/15/2012 10:57 AM 149.4* 0 - 125 pg/mL Final  05/20/2012 08:57 PM 3473.0* 0 - 125  pg/mL Final   Thyroid Function Tests: No results for input(s): TSH, T4TOTAL, T3FREE, THYROIDAB in the last 72 hours.  Invalid input(s): FREET3 Miscellaneous Lab Results  Component Value Date   DDIMER 3.15* 05/21/2012    Radiology/Studies:  Dg Chest 2 View  04/09/2016  CLINICAL DATA:  LEFT side squeezing chest pain, shortness of breath, loss of appetite and dry cough for 3 days, history non ischemic cardiomyopathy, chronic systolic heart failure, hypertension EXAM: CHEST  2 VIEW COMPARISON:  04/13/2013 FINDINGS: Enlargement of cardiac silhouette with pulmonary vascular congestion. Mediastinal contours normal. Chronic elevation of LEFT diaphragm with increased LEFT basilar atelectasis versus developing infiltrate. No additional infiltrate,  pleural effusion or pneumothorax. Bones unremarkable. IMPRESSION: Enlargement of cardiac silhouette with pulmonary vascular congestion. Increased atelectasis versus question developing infiltrate at LEFT lung base. Electronically Signed   By: Lavonia Dana M.D.   On: 04/09/2016 11:44    EKG: Dated 04/10/16 sinus rhythm at 65 minutes intervals 19/11/35 LVH with repolarization abnormalities    Assessment and Plan:  NICM  LVH repol  Chest pain --noncardiac  HTN poorly controlled  Daytime Somnolence   EF is not in the range to pursue ICD implantation  He is encouraged to be compliant with his medications; the context of his hypertension either increase his carvedilol from 12.5--25 mg twice daily.  He has significant daytime somnolence. Sleep apnea, being a reversible cause of hypertension, I think a sleep study might be in order. I will defer this to his primary care physicians.  I agree that his chest pain is noncardiac. No further cardiac workup is indicated. I suggested he try a two-week trial of nonsteroidals       Virl Axe

## 2016-04-24 ENCOUNTER — Other Ambulatory Visit (HOSPITAL_COMMUNITY): Payer: Self-pay | Admitting: *Deleted

## 2016-04-24 MED ORDER — LISINOPRIL 20 MG PO TABS: 20.0000 mg | ORAL_TABLET | Freq: Every day | ORAL | 3 refills | Status: DC

## 2016-04-26 ENCOUNTER — Ambulatory Visit (INDEPENDENT_AMBULATORY_CARE_PROVIDER_SITE_OTHER): Payer: Medicare Other | Admitting: Internal Medicine

## 2016-04-26 VITALS — BP 148/94 | HR 63 | Temp 98.4°F | Ht 72.0 in | Wt 204.0 lb

## 2016-04-26 DIAGNOSIS — I5042 Chronic combined systolic (congestive) and diastolic (congestive) heart failure: Secondary | ICD-10-CM | POA: Diagnosis not present

## 2016-04-26 DIAGNOSIS — I428 Other cardiomyopathies: Secondary | ICD-10-CM

## 2016-04-26 DIAGNOSIS — I429 Cardiomyopathy, unspecified: Secondary | ICD-10-CM | POA: Diagnosis not present

## 2016-04-26 DIAGNOSIS — R079 Chest pain, unspecified: Secondary | ICD-10-CM

## 2016-04-26 DIAGNOSIS — L309 Dermatitis, unspecified: Secondary | ICD-10-CM

## 2016-04-26 DIAGNOSIS — I5022 Chronic systolic (congestive) heart failure: Secondary | ICD-10-CM | POA: Diagnosis not present

## 2016-04-26 DIAGNOSIS — I10 Essential (primary) hypertension: Secondary | ICD-10-CM | POA: Diagnosis present

## 2016-04-26 MED ORDER — TRIAMCINOLONE ACETONIDE 0.5 % EX CREA: 1.0000 "application " | TOPICAL_CREAM | Freq: Every day | CUTANEOUS | 0 refills | Status: DC | PRN

## 2016-04-26 MED ORDER — CARVEDILOL 25 MG PO TABS: 25.0000 mg | ORAL_TABLET | Freq: Two times a day (BID) | ORAL | 1 refills | Status: DC

## 2016-04-26 MED ORDER — CARVEDILOL 25 MG PO TABS
25.0000 mg | ORAL_TABLET | Freq: Two times a day (BID) | ORAL | 1 refills | Status: DC
Start: 2016-04-26 — End: 2016-04-26

## 2016-04-26 MED ORDER — NITROGLYCERIN 0.4 MG SL SUBL: SUBLINGUAL_TABLET | SUBLINGUAL | 0 refills | Status: DC

## 2016-04-26 MED FILL — CARVEDILOL 25 MG TABLET: 25 | 90 days supply | Qty: 180 | Fill #0

## 2016-04-26 MED FILL — NITROGLYCERIN 0.4 MG TAB SL: 0.4 | 30 days supply | Qty: 25 | Fill #0

## 2016-04-26 MED FILL — TRIAMCINOLONE 0.5% CREAM: 0.5 | 14 days supply | Qty: 30 | Fill #0

## 2016-04-26 NOTE — Progress Notes (Signed)
   [facility] [name], MD Phone: [phone]  Reason For Visit: Hospital Follow up   # Hospitalized for chest pain, thought by cardiology to be non-cardiac in nature. Patient with ACS rule out. Patient states he went to the ED as chest pain, had been worse then before.   HTN  - uncontrolled, non-smoker - Coreg 25 mg twice daily, increased recently by cardiologist for better blood pressure control. Has been taking 25 mg of coreg for a couple of days now.  - Other meds include lisinopril 20 mg  - Hx of chronic chest pain, recently ruled out for ACS  - No palpations, no dizziness, no headache, no blurry vision,   - Cardiology concerned about the possiblity of OSA playing a part in patient HTN  - Patient denies any snoring, having apneic episodes during sleep, no daytimes sleepiness , no vivid dreams, lack of concentration.   Heart failure   - Recently seen by cardiology on July 18th, 2017  - No paroxysomal noctural dyspnea - Positive for orthopnea - patient sleeps on the couch, sitting up or when in the bed use 2-3 pillows.  - No swelling in legs  - Easily fatigued, denies dyspneic  - Meds include lasix 40 mg twice daily, Coreg 25 mg, and lisinopril 20 mg  Past Medical History Reviewed problem list.  Medications- reviewed and updated No additions to family history Social history- patient is a non-smoker  Objective: BP (!) 148/94 (BP Location: Right Arm, Patient Position: Sitting, Cuff Size: Normal)   Pulse 63   Temp 98.4 F (36.9 C) (Oral)   Ht 6' (1.829 m)   Wt 204 lb (92.5 kg)   SpO2 98%   BMI 27.67 kg/m  Gen: NAD, alert, cooperative with exam Neck: FROM, supple,  CV: RRR, good S1/S2, no murmur, 2+ radial pulses   Resp: CTABL, no wheezes, non-labored Abd: SNTND, BS present,  Ext: No edema, warm, normal tone, moves UE/LE spontaneously  Assessment/Plan: See problem based a/p  Hypertension Uncontrolled, Goal Blood pressure 140/80.  -  Cardiology recently increase Coreg to 25 mg from 12.5 mg twice a day-patient has been on this medication for a couple of days. -Follow up in 2 weeks, if blood pressure continues to remain above goal. Increase lisinopril to 40 mg. - Discussed with patient that blood pressure may be related to sleep issues, she currently very reticent to get a sleep study. STOP-BANG questionnaire puts patient as intermediate risk of OSA ( due to age, gender, blood pressure) -  Have not measured neck circumference. If patient remains uncontrolled after further titration of medications will push for sleep study/   Chronic combined systolic and diastolic heart failure, NYHA class 3 (HCC) No issues today, recently seen by cardiology. Euvolemic on exam.  - Continue Lasix 40 mg BID, Coreg 25 mg BID and Lisinopril 20 mg  - Continue Aspirin 81 mg

## 2016-04-26 NOTE — Patient Instructions (Signed)
High Blood Pressure Your BP is not well controlled Your goal is to have a BP average < 140/80 Medicine Changes: Recently changed to coreg 25 mg    Come back to see [place] in: two weeks

## 2016-04-26 NOTE — Assessment & Plan Note (Signed)
Uncontrolled, Goal Blood pressure 140/80.  - Cardiology recently increase Coreg to 25 mg from 12.5 mg twice a day-patient has been on this medication for a couple of days. -Follow up in 2 weeks, if blood pressure continues to remain above goal. Increase lisinopril to 40 mg. - Discussed with patient that blood pressure may be related to sleep issues, she currently very reticent to get a sleep study. STOP-BANG questionnaire puts patient as intermediate risk of OSA ( due to age, gender, blood pressure) -  Have not measured neck circumference. If patient remains uncontrolled after further titration of medications will push for sleep study/

## 2016-04-26 NOTE — Assessment & Plan Note (Signed)
No issues today, recently seen by cardiology. Euvolemic on exam.  - Continue Lasix 40 mg BID, Coreg 25 mg BID and Lisinopril 20 mg  - Continue Aspirin 81 mg

## 2016-04-30 ENCOUNTER — Ambulatory Visit (HOSPITAL_COMMUNITY)
Admit: 2016-04-30 | Discharge: 2016-04-30 | Disposition: A | Discharge status: Home / Self Care | Payer: Medicare Other | Source: Ambulatory Visit | Attending: Cardiology | Admitting: Cardiology

## 2016-04-30 ENCOUNTER — Encounter (HOSPITAL_COMMUNITY): Payer: Self-pay

## 2016-04-30 VITALS — BP 157/98 | HR 82 | Resp 18 | Wt 197.1 lb

## 2016-04-30 DIAGNOSIS — Z79899 Other long term (current) drug therapy: Secondary | ICD-10-CM | POA: Insufficient documentation

## 2016-04-30 DIAGNOSIS — I130 Hypertensive heart and chronic kidney disease with heart failure and stage 1 through stage 4 chronic kidney disease, or unspecified chronic kidney disease: Secondary | ICD-10-CM | POA: Diagnosis not present

## 2016-04-30 DIAGNOSIS — Z7982 Long term (current) use of aspirin: Secondary | ICD-10-CM | POA: Insufficient documentation

## 2016-04-30 DIAGNOSIS — I5042 Chronic combined systolic (congestive) and diastolic (congestive) heart failure: Secondary | ICD-10-CM

## 2016-04-30 DIAGNOSIS — I428 Other cardiomyopathies: Secondary | ICD-10-CM | POA: Diagnosis not present

## 2016-04-30 DIAGNOSIS — N183 Chronic kidney disease, stage 3 (moderate): Secondary | ICD-10-CM | POA: Insufficient documentation

## 2016-04-30 DIAGNOSIS — I159 Secondary hypertension, unspecified: Secondary | ICD-10-CM | POA: Diagnosis not present

## 2016-04-30 DIAGNOSIS — I5022 Chronic systolic (congestive) heart failure: Secondary | ICD-10-CM | POA: Diagnosis not present

## 2016-04-30 DIAGNOSIS — I429 Cardiomyopathy, unspecified: Secondary | ICD-10-CM

## 2016-04-30 MED ORDER — ISOSORBIDE MONONITRATE ER 30 MG PO TB24: 30.0000 mg | ORAL_TABLET | Freq: Every day | ORAL | 6 refills | Status: DC

## 2016-04-30 MED ORDER — SACUBITRIL-VALSARTAN 24-26 MG PO TABS: 1.0000 | ORAL_TABLET | Freq: Two times a day (BID) | ORAL | 3 refills | Status: DC

## 2016-04-30 MED FILL — ISOSORBIDE MN ER 30 MG TAB: 30 | 30 days supply | Qty: 30 | Fill #0

## 2016-04-30 MED FILL — ENTRESTO 24 MG-26 MG TABLET: 24-26 | 30 days supply | Qty: 60 | Fill #0

## 2016-04-30 NOTE — Progress Notes (Signed)
ADVANCED HF CLINIC NOTE  Patient ID: [name], male   DOB: 1955/11/01, 60 y.o.   MRN: [id number] PCP: [place].  HF MD: Dr Haroldine Laws   HPI: [name] is a 60 y.o. gentlemen with history of HTN, daily EtOH and previous diagnosis of biventricular heart failure with EF 15% with recovered EF. He has chronic atypical chest pain.    Today he returns for post hospital HF follow up. Admitted in July 2017 with chest pain. Troponins negative so he was discharged the next day. Overall feeling ok. Mild dyspnea with exertion. SOB going up steps. Weight at home 195-200 pounds. Does not smoke. Drinks 2-3 alcholic drinks a week.  Taking all medications.    05/25/12 RHC/LHC  RA = 7  RV = 56/3/10  PA = 52/23 (34)  PCW = 17 v= 25  Fick cardiac output/index = 7.8/3.9  Thermo CO/CI = 6.1/3.0  PVR = 2.1 Woods  SVR = 1194  FA sat = 97%  PA sat = 79%, 80%  High SVC = 79%  EF 15%  Coronaries normal.  CPX 07/14/12: Peak VO2: 21.34ml/kg/min  % predicted peak VO2: 65.1%, VE/VCO2 slope: 25.0, OUES: 2.27, Peak RER: 1.12, Ventilatory Threshold: 12.4 % predicted peak VO2: 38.3%.    04/2012 Echo: LVEF 15%, diffuse hypokinesis. Grade 2 diastolic dysfunction. LA mildly dilated. RV moderately reduced. RA mildly dilated. PAPP 44 mmHg.  04/2012 Abd u/s: Small volume abdominal ascites. Cannot exclude mild cirrhosis.  04/2012 VQ scan: Very low probability for pulmonary embolism.  Echo 08/06/12: LVEF A999333, grade 1 diastolic dysfunction.  RV recovered.  Echo 7/14: LVEF 50-55% Even monitor: lots of transmitted episodes. Tracing all NSR with no ectopy.   06/02/12 Potassium 4.8 Creatinine 1.8 06/15/12 Potassium 4.5 Creatinine 1.45 7/14 Potassium 4 Creatinine 1.46 12/08/2015: K 3.6 Creatinine 1.16  04/09/2016: K 3.9 Creatinine 1.48   ROS: All systems negative except as listed in HPI, PMH and Problem List.  Past Medical History:  Diagnosis Date  . Alcohol abuse   . Chronic combined systolic and diastolic  heart failure, NYHA class 3 ([place]) 06/02/2012  . Chronic systolic heart failure ([place]) 06/02/2012  . Hypertension   . NICM (nonischemic cardiomyopathy) ([place]) 09/04/2012  . Psoriasis    Possible psoriasis in the past    Current Outpatient Prescriptions  Medication Sig Dispense Refill  . aspirin 81 MG EC tablet TAKE 1 TABLET (81 MG TOTAL) BY MOUTH DAILY. 30 tablet 3  . carvedilol (COREG) 25 MG tablet Take 1 tablet (25 mg total) by mouth 2 (two) times daily with a meal. 180 tablet 1  . colchicine 0.6 MG tablet Take 3 tablets now. If symptoms persist take 1 tablet every 12 hours until symptoms resolve. 10 tablet 1  . furosemide (LASIX) 40 MG tablet Take 1 tablet (40 mg total) by mouth 2 (two) times daily. 60 tablet 0  . lisinopril (PRINIVIL,ZESTRIL) 20 MG tablet Take 1 tablet (20 mg total) by mouth daily. 90 tablet 3  . triamcinolone cream (KENALOG) 0.5 % Apply 1 application topically 2 (two) times daily.    . nitroGLYCERIN (NITROSTAT) 0.4 MG SL tablet PLACE 1 TABLET UNDER THE TONGUE EVERY 5 MINUTES AS NEEDED FOR CHEST PAIN. (Patient not taking: Reported on 04/30/2016) 10 tablet 0   No current facility-administered medications for this encounter.      PHYSICAL EXAM: Vitals:   04/30/16 1218  BP: (!) 157/98  BP Location: Left Arm  Patient Position: Sitting  Cuff Size: Normal  Pulse: 82  Resp: 18  SpO2: 98%  Weight: 197 lb 2 oz (89.4 kg)    General:  No resp difficulty. Ambulated in the clinic without difficulty.  HEENT: normal Neck: supple. JVP flat. Carotids 2+ bilaterally; no bruits. No lymphadenopathy or thryomegaly appreciated. Cor: PMI normal. Regular rate & rhythm. No rubs, gallops, murmurs.  Lungs: clear Abdomen: soft, nontender, nondistended. No hepatosplenomegaly. No bruits or masses. Good bowel sounds. Aortic impulse not widened or overly prominent Extremities: no cyanosis, clubbing, rash, edema. Multiple areas of vitiligo and skin lesions Neuro: alert & orientedx3, cranial  nerves grossly intact. Moves all 4 extremities w/o difficulty. Affect pleasant.   ASSESSMENT & PLAN: 1. Uncontrolled HTN- Stop lisinopril and allow 36 hour washout. Start entresto 24/26 mg twice a day.   2.Chronic Systolic Heart Failure.  H/O NICM - Echo EF 35-40%.  Volume status ok. Continue lasix 40 mg twice a day. Continue coreg 12.5 mg twice a day.  Stop lisinopril and allow 36 hour washout. Start entresto 24-26 mg twice a day.  3. H/O syncope - resolved.  4. H/O ETOH- resolved.  5. Chronic renal failure, class III- reviewed BMET from 04/09/2016   Follow up 2 weeks. Check BMET at that time. May need to cut back lasix.     Amy Clegg NP-C

## 2016-04-30 NOTE — Patient Instructions (Signed)
STOP Lisinopril WAIT 36 HOURS, THEN START Entresto 24/26 mg, one tab twice a d ay  Your physician recommends that you schedule a follow-up appointment in:  2 weeks with the NP/PA

## 2016-04-30 NOTE — Progress Notes (Addendum)
Advanced Heart Failure Medication Review by a Pharmacist  Does the patient  feel that his/her medications are working for him/her?  yes  Has the patient been experiencing any side effects to the medications prescribed?  no  Does the patient measure his/her own blood pressure or blood glucose at home?  no   Does the patient have any problems obtaining medications due to transportation or finances?   no  Understanding of regimen: fair Understanding of indications: fair Potential of compliance: fair Patient understands to avoid NSAIDs. Patient understands to avoid decongestants.  Issues to address at subsequent visits: Compliance   Pharmacist comments:  [name] is a pleasant 60 yo M presenting without a medication list but with good recall of his regimen. Patient was recently discharged from hospital and all medications have been reviewed. He reports better compliance with his medications but does admit to missing 1-2 doses per week. I called our Outpatient pharmacy and verified last fill dates and they were all last filled recently. He did state that he uses NTG almost every day for stable angina which sometimes relieves his CP. He did not have any other medication-related questions or concerns for me at this time.    Ruta Hinds. Velva Harman, PharmD, BCPS, CPP Clinical Pharmacist Pager: [phone] Phone: [phone] 04/30/2016 12:49 PM   Time with patient: 8 minutes Preparation and documentation time: 2 minutes Total time: 10 minutes

## 2016-05-10 ENCOUNTER — Ambulatory Visit (INDEPENDENT_AMBULATORY_CARE_PROVIDER_SITE_OTHER): Payer: Medicare Other | Admitting: Internal Medicine

## 2016-05-10 ENCOUNTER — Encounter: Payer: Self-pay | Admitting: Internal Medicine

## 2016-05-10 VITALS — BP 130/64 | HR 64 | Temp 98.6°F | Ht 72.0 in | Wt 200.0 lb

## 2016-05-10 DIAGNOSIS — I159 Secondary hypertension, unspecified: Secondary | ICD-10-CM

## 2016-05-10 DIAGNOSIS — R911 Solitary pulmonary nodule: Secondary | ICD-10-CM | POA: Diagnosis present

## 2016-05-10 NOTE — Patient Instructions (Signed)
I am placing an order for you to have CT chest to recheck on a pulmonary nodule you had in 2014. Otherwise you can follow up 2 months.  High Blood Pressure Your BP is well controlled Your goal is to have a BP average < 140/80 Call us if: Chest pain, SOB, coughing all the time  Come back to see [place] in: 2 months

## 2016-05-10 NOTE — Assessment & Plan Note (Signed)
Controlled today  - Cardiology recently started patient on Enestro - patient complains of dizziness at times with the [facility] usually an hour after take medication.  No falls. Will follow up at next visit  - Continue Coreg 25 mg - Follow up in 2 months

## 2016-05-10 NOTE — Progress Notes (Signed)
   [facility] [name], MD Phone: [phone]  Reason For Visit:   # CHRONIC HTN: Reports  Current Meds - Enstero, Coreg  Reports good compliance, took meds today. Tolerating well, w/o complaints. Lifestyle - Wants to get back to exercising. likes doing work outside all the time.  Denies CP, dyspnea, HA, edema,  Endorses dizziness / lightheadedness about an hour after taking this medication. Denies any falls. Patient usually lays down.    Past Medical History Reviewed problem list.  Medications- reviewed and updated No additions to family history Social history- patient is a form smoker, ETOH -  3 beers a week   Objective: BP 130/64 (BP Location: Right Arm, Patient Position: Sitting, Cuff Size: Normal)   Pulse 64   Temp 98.6 F (37 C) (Oral)   Ht 6' (1.829 m)   Wt 200 lb (90.7 kg)   BMI 27.12 kg/m  Gen: NAD, alert, cooperative with exam Cardio: regular rate and rhythm, S1S2 heard, no murmurs appreciated Pulm: clear to auscultation bilaterally, no wheezes, rhonchi or rales    Assessment/Plan: See problem based a/p  Hypertension Controlled today  - Cardiology recently started patient on Enestro - patient complains of dizziness at times with the [facility] usually an hour after take medication.  No falls. Will follow up at next visit  - Continue Coreg 25 mg - Follow up in 2 months   Pulmonary nodule, right CT order for follow up of pulmonary nodule seen 2014

## 2016-05-10 NOTE — Assessment & Plan Note (Signed)
CT order for follow up of pulmonary nodule seen 2014

## 2016-05-14 ENCOUNTER — Ambulatory Visit (HOSPITAL_COMMUNITY)
Admission: RE | Admit: 2016-05-14 | Discharge: 2016-05-14 | Disposition: A | Discharge status: Home / Self Care | Payer: Medicare Other | Source: Ambulatory Visit | Attending: Cardiology | Admitting: Cardiology

## 2016-05-14 ENCOUNTER — Other Ambulatory Visit: Payer: Self-pay | Admitting: Internal Medicine

## 2016-05-14 ENCOUNTER — Telehealth (HOSPITAL_COMMUNITY): Payer: Self-pay

## 2016-05-14 VITALS — BP 144/82 | HR 59 | Wt 200.8 lb

## 2016-05-14 DIAGNOSIS — F1010 Alcohol abuse, uncomplicated: Secondary | ICD-10-CM

## 2016-05-14 DIAGNOSIS — R911 Solitary pulmonary nodule: Secondary | ICD-10-CM

## 2016-05-14 DIAGNOSIS — I5022 Chronic systolic (congestive) heart failure: Secondary | ICD-10-CM | POA: Insufficient documentation

## 2016-05-14 DIAGNOSIS — Z79899 Other long term (current) drug therapy: Secondary | ICD-10-CM | POA: Diagnosis not present

## 2016-05-14 DIAGNOSIS — I429 Cardiomyopathy, unspecified: Secondary | ICD-10-CM | POA: Diagnosis not present

## 2016-05-14 DIAGNOSIS — I130 Hypertensive heart and chronic kidney disease with heart failure and stage 1 through stage 4 chronic kidney disease, or unspecified chronic kidney disease: Secondary | ICD-10-CM | POA: Diagnosis not present

## 2016-05-14 DIAGNOSIS — I428 Other cardiomyopathies: Secondary | ICD-10-CM | POA: Diagnosis not present

## 2016-05-14 DIAGNOSIS — Z7982 Long term (current) use of aspirin: Secondary | ICD-10-CM | POA: Insufficient documentation

## 2016-05-14 DIAGNOSIS — N183 Chronic kidney disease, stage 3 (moderate): Secondary | ICD-10-CM | POA: Insufficient documentation

## 2016-05-14 DIAGNOSIS — I5042 Chronic combined systolic (congestive) and diastolic (congestive) heart failure: Secondary | ICD-10-CM | POA: Diagnosis not present

## 2016-05-14 DIAGNOSIS — I10 Essential (primary) hypertension: Secondary | ICD-10-CM

## 2016-05-14 LAB — BASIC METABOLIC PANEL
ANION GAP: 7 (ref 5–15)
BUN: 16 mg/dL (ref 6–20)
CHLORIDE: 104 mmol/L (ref 101–111)
CO2: 29 mmol/L (ref 22–32)
CREATININE: 1.37 mg/dL — AB (ref 0.61–1.24)
Calcium: 8.6 mg/dL — ABNORMAL LOW (ref 8.9–10.3)
GFR calc non Af Amer: 54 mL/min — ABNORMAL LOW (ref >60)
GLUCOSE: 101 mg/dL — AB (ref 65–99)
Potassium: 3.8 mmol/L (ref 3.5–5.1)
Sodium: 140 mmol/L (ref 135–145)

## 2016-05-14 LAB — BRAIN NATRIURETIC PEPTIDE: B Natriuretic Peptide: 326 pg/mL — ABNORMAL HIGH (ref 0.0–100.0)

## 2016-05-14 MED ORDER — FUROSEMIDE 40 MG PO TABS: 40.0000 mg | ORAL_TABLET | Freq: Every day | ORAL | 6 refills | Status: DC

## 2016-05-14 MED FILL — FUROSEMIDE 40 MG TABLET: 40 | 30 days supply | Qty: 30 | Fill #0 | Status: TO

## 2016-05-14 NOTE — Patient Instructions (Signed)
Routine lab work today. Will notify you of abnormal results, otherwise no news is good news!  DECREASE lasix to 40 mg once daily.  Follow up with Amy Clegg NP-C in 6 weeks.  Do the following things EVERYDAY: 1) Weigh yourself in the morning before breakfast. Write it down and keep it in a log. 2) Take your medicines as prescribed 3) Eat low salt foods-Limit salt (sodium) to 2000 mg per day.  4) Stay as active as you can everyday 5) Limit all fluids for the day to less than 2 liters

## 2016-05-14 NOTE — Addendum Note (Signed)
Encounter addended by: Adora Fridge, RPH on: 05/14/2016  2:38 PM<BR>    Actions taken: Order Reconciliation Section accessed, Sign clinical note

## 2016-05-14 NOTE — Progress Notes (Addendum)
Advanced Heart Failure Medication Review by a Pharmacist  Does the patient  feel that his/her medications are working for him/her?  yes  Has the patient been experiencing any side effects to the medications prescribed?  no  Does the patient measure his/her own blood pressure or blood glucose at home?  no   Does the patient have any problems obtaining medications due to transportation or finances?   No - Delene Loll will be $45/mo so will enroll in [employer] and relay info to [place] ($800 good through 05/13/2017)  Billing ID: [id number] Person Code: [id number] Group: [id number] BIN: [id number] PCN for Part D: [id number]   Understanding of regimen: good Understanding of indications: good Potential of compliance: good Patient understands to avoid NSAIDs. Patient understands to avoid decongestants.  Issues to address at subsequent visits: None   Pharmacist comments:  Mr. Maida is a pleasant 60 yo M presenting with a medication list. He reports good compliance with his regimen and did not have any specific medication-related questions or concerns for me at this time.   Ruta Hinds. Velva Harman, PharmD, BCPS, CPP Clinical Pharmacist Pager: [phone] Phone: [phone] 05/14/2016 12:03 PM      Time with patient: 8 minutes Preparation and documentation time: 8 minutes Total time: 16 minutes

## 2016-05-14 NOTE — Telephone Encounter (Signed)
Received call from pharmacy stating patient who was seen in CHF clinic today had two different prescriptions sent in today for his lasix with two different directions. One Rx was from [employer] NP-C who is a CHF provider in our clinic that dosed patient after examination to take 20 mg once daily. Another Rx was sent by an unknown provider to the patient and our providers by the name Asiyah Mikell for lasix twice daily (this is not the correct Rx). Personal message to unknown provider sent to clarify if this was a mistake or if this provider also cares for patient in relation to diuretic regimen. Advised outpatient pharmacy to fill lasix once dialy as prescribed by Amy Clegg NP-C and disregard the other.  Renee Pain, RN

## 2016-05-14 NOTE — Progress Notes (Signed)
ADVANCED HF CLINIC NOTE  Patient ID: [name], male   DOB: 27-Aug-1956, 60 y.o.   MRN: [id number] PCP: [place].  HF MD: Dr [name]   HPI: [name] is a 60 y.o. gentlemen with history of HTN, daily EtOH and previous diagnosis of biventricular heart failure with EF 15% with recovered EF. He has chronic atypical chest pain.    Today he returns HF follow up. Last visit lisinopril was stopped and entresto was started.  Overall feeling better. . Denies SOB/PND/Orthopnea. Does admit to dyspnea with steps. Weight at home 197 pounds. Taking all medications. Drinking 2-3 alcholic drinks per week.    05/25/12 RHC/LHC  RA = 7  RV = 56/3/10  PA = 52/23 (34)  PCW = 17 v= 25  Fick cardiac output/index = 7.8/3.9  Thermo CO/CI = 6.1/3.0  PVR = 2.1 Woods  SVR = 1194  FA sat = 97%  PA sat = 79%, 80%  High SVC = 79%  EF 15%  Coronaries normal.  CPX 07/14/12: Peak VO2: 21.19ml/kg/min  % predicted peak VO2: 65.1%, VE/VCO2 slope: 25.0, OUES: 2.27, Peak RER: 1.12, Ventilatory Threshold: 12.4 % predicted peak VO2: 38.3%.    04/2012 Echo: LVEF 15%, diffuse hypokinesis. Grade 2 diastolic dysfunction. LA mildly dilated. RV moderately reduced. RA mildly dilated. PAPP 44 mmHg.  04/2012 Abd u/s: Small volume abdominal ascites. Cannot exclude mild cirrhosis.  04/2012 VQ scan: Very low probability for pulmonary embolism.  Echo 08/06/12: LVEF A999333, grade 1 diastolic dysfunction.  RV recovered.  Echo 7/14: LVEF 50-55% Even monitor: lots of transmitted episodes. Tracing all NSR with no ectopy.   06/02/12 Potassium 4.8 Creatinine 1.8 06/15/12 Potassium 4.5 Creatinine 1.45 7/14 Potassium 4 Creatinine 1.46 12/08/2015: K 3.6 Creatinine 1.16  04/09/2016: K 3.9 Creatinine 1.48   ROS: All systems negative except as listed in HPI, PMH and Problem List.  Past Medical History:  Diagnosis Date  . Alcohol abuse   . Chronic combined systolic and diastolic heart failure, NYHA class 3 ([place]) 06/02/2012  .  Chronic systolic heart failure ([place]) 06/02/2012  . Hypertension   . NICM (nonischemic cardiomyopathy) ([place]) 09/04/2012  . Psoriasis    Possible psoriasis in the past    Current Outpatient Prescriptions  Medication Sig Dispense Refill  . aspirin 81 MG EC tablet TAKE 1 TABLET (81 MG TOTAL) BY MOUTH DAILY. 30 tablet 3  . carvedilol (COREG) 25 MG tablet Take 1 tablet (25 mg total) by mouth 2 (two) times daily with a meal. 180 tablet 1  . furosemide (LASIX) 40 MG tablet TAKE 1 TABLET BY MOUTH 2 TIMES DAILY. 60 tablet 0  . isosorbide mononitrate (IMDUR) 30 MG 24 hr tablet Take 1 tablet (30 mg total) by mouth daily. 30 tablet 6  . sacubitril-valsartan (ENTRESTO) 24-26 MG Take 1 tablet by mouth 2 (two) times daily. 60 tablet 3  . triamcinolone cream (KENALOG) 0.5 % Apply 1 application topically 2 (two) times daily.    . colchicine 0.6 MG tablet Take 3 tablets now. If symptoms persist take 1 tablet every 12 hours until symptoms resolve. (Patient not taking: Reported on 05/14/2016) 10 tablet 1  . nitroGLYCERIN (NITROSTAT) 0.4 MG SL tablet PLACE 1 TABLET UNDER THE TONGUE EVERY 5 MINUTES AS NEEDED FOR CHEST PAIN. (Patient not taking: Reported on 04/30/2016) 10 tablet 0   No current facility-administered medications for this encounter.      PHYSICAL EXAM: Vitals:   05/14/16 1154  BP: (!) 144/82  BP Location:  Right Arm  Patient Position: Sitting  Cuff Size: Normal  Pulse: (!) 59  SpO2: 99%  Weight: 200 lb 12.8 oz (91.1 kg)    General:  No resp difficulty. Ambulated in the clinic without difficulty.  HEENT: normal Neck: supple. JVP flat. Carotids 2+ bilaterally; no bruits. No lymphadenopathy or thryomegaly appreciated. Cor: PMI normal. Regular rate & rhythm. No rubs, gallops, murmurs.  Lungs: clear Abdomen: soft, nontender, nondistended. No hepatosplenomegaly. No bruits or masses. Good bowel sounds. Aortic impulse not widened or overly prominent Extremities: no cyanosis, clubbing, rash, edema.  Multiple areas of vitiligo and skin lesions Neuro: alert & orientedx3, cranial nerves grossly intact. Moves all 4 extremities w/o difficulty. Affect pleasant.    ASSESSMENT & PLAN: 1. Uncontrolled HTN- Better controlled.  2.Chronic Systolic Heart Failure.  H/O NICM - 01/2016 Echo EF 35-40%.  NYHA II-III. Volume status ok. Cut back lasix to 40 mg daily.  Continue coreg 12.5 mg twice a day. Will not increase for heart rate 59.  Continue  entresto 24-26 mg twice a day. Will increase to 49-51 if renal function stable.  3. H/O syncope - resolved.  4. H/O ETOH- has started drinking 3 alcoholic drinks a week. Encouraged to stop.  5. Chronic renal failure, class III- BMET today.  6. Pulmonary Nodule. - has CT scheduled later this month.   Follow up 6 weeks.   Tony Haigler NP-C

## 2016-05-15 ENCOUNTER — Telehealth (HOSPITAL_COMMUNITY): Payer: Self-pay | Admitting: Cardiology

## 2016-05-15 DIAGNOSIS — I509 Heart failure, unspecified: Secondary | ICD-10-CM

## 2016-05-15 MED ORDER — SACUBITRIL-VALSARTAN 49-51 MG PO TABS: 1.0000 | ORAL_TABLET | Freq: Two times a day (BID) | ORAL | 6 refills | Status: DC

## 2016-05-15 MED FILL — ENTRESTO 49 MG-51 MG TABLET: 49-51 | 30 days supply | Qty: 60 | Fill #0

## 2016-05-15 NOTE — Telephone Encounter (Signed)
Patient aware. And voiced understanding, repeat labs 8/28

## 2016-05-15 NOTE — Telephone Encounter (Signed)
-----   Message from Conrad Jal, NP sent at 05/14/2016  1:58 PM EDT ----- Please call entresto 49-51 twice a day. BMET in 10 days.

## 2016-05-17 ENCOUNTER — Ambulatory Visit (HOSPITAL_COMMUNITY)
Admission: RE | Admit: 2016-05-17 | Discharge: 2016-05-17 | Disposition: A | Discharge status: Home / Self Care | Payer: Medicare Other | Source: Ambulatory Visit | Attending: Family Medicine | Admitting: Family Medicine

## 2016-05-17 DIAGNOSIS — Q791 Other congenital malformations of diaphragm: Secondary | ICD-10-CM | POA: Diagnosis not present

## 2016-05-17 DIAGNOSIS — I517 Cardiomegaly: Secondary | ICD-10-CM | POA: Diagnosis not present

## 2016-05-17 DIAGNOSIS — R911 Solitary pulmonary nodule: Secondary | ICD-10-CM

## 2016-05-17 DIAGNOSIS — I2510 Atherosclerotic heart disease of native coronary artery without angina pectoris: Secondary | ICD-10-CM | POA: Diagnosis not present

## 2016-05-20 ENCOUNTER — Telehealth: Payer: Self-pay | Admitting: Internal Medicine

## 2016-05-20 NOTE — Telephone Encounter (Signed)
Called patient to let him know that the pulmonary nodules noted on CT was stable and therefore likely benign, not requiring any further follow up.

## 2016-05-22 MED FILL — TRIAMCINOLONE 0.5% CREAM: 0.5 | 20 days supply | Qty: 30 | Fill #0

## 2016-05-27 ENCOUNTER — Ambulatory Visit (HOSPITAL_COMMUNITY)
Admission: RE | Admit: 2016-05-27 | Discharge: 2016-05-27 | Disposition: A | Discharge status: Home / Self Care | Payer: Medicare Other | Source: Ambulatory Visit | Attending: Cardiology | Admitting: Cardiology

## 2016-05-27 DIAGNOSIS — I509 Heart failure, unspecified: Secondary | ICD-10-CM | POA: Insufficient documentation

## 2016-05-27 LAB — BASIC METABOLIC PANEL
Anion gap: 5 (ref 5–15)
BUN: 13 mg/dL (ref 6–20)
CHLORIDE: 107 mmol/L (ref 101–111)
CO2: 27 mmol/L (ref 22–32)
CREATININE: 1.2 mg/dL (ref 0.61–1.24)
Calcium: 8.6 mg/dL — ABNORMAL LOW (ref 8.9–10.3)
GFR calc Af Amer: >60 mL/min (ref >60)
GFR calc non Af Amer: >60 mL/min (ref >60)
GLUCOSE: 114 mg/dL — AB (ref 65–99)
POTASSIUM: 3.7 mmol/L (ref 3.5–5.1)
SODIUM: 139 mmol/L (ref 135–145)

## 2016-05-27 MED FILL — ISOSORBIDE MN ER 30 MG TAB: 30 | 30 days supply | Qty: 30 | Fill #1

## 2016-06-24 NOTE — Progress Notes (Signed)
ADVANCED HF CLINIC NOTE  Patient ID: [name], male   DOB: Sep 27, 1956, 60 y.o.   MRN: [id number] PCP: [place].  HF MD: Dr [name]   HPI: [name] is a 60 y.o. gentlemen with history of HTN, daily EtOH and previous diagnosis of biventricular heart failure with EF 15% with recovered EF. He has chronic atypical chest pain.    Today he returns HF follow up. Last visit entresto was increased to 49/51 mg twice a day. Complaining of bloating. Overall feeling better. Mild dyspnea with steps. Denies PND/Orthopnea. Able to walk around the grocery store without difficulty. Weight at home 200 pounds. Tries to follow low salt food but had ham over the weekend.  Taking all medications. Drinking 3 alcholic drinks per week.    05/25/12 RHC/LHC  RA = 7  RV = 56/3/10  PA = 52/23 (34)  PCW = 17 v= 25  Fick cardiac output/index = 7.8/3.9  Thermo CO/CI = 6.1/3.0  PVR = 2.1 Woods  SVR = 1194  FA sat = 97%  PA sat = 79%, 80%  High SVC = 79%  EF 15%  Coronaries normal.  CPX 07/14/12: Peak VO2: 21.28ml/kg/min  % predicted peak VO2: 65.1%, VE/VCO2 slope: 25.0, OUES: 2.27, Peak RER: 1.12, Ventilatory Threshold: 12.4 % predicted peak VO2: 38.3%.    04/2012 Echo: LVEF 15%, diffuse hypokinesis. Grade 2 diastolic dysfunction. LA mildly dilated. RV moderately reduced. RA mildly dilated. PAPP 44 mmHg.  04/2012 Abd u/s: Small volume abdominal ascites. Cannot exclude mild cirrhosis.  04/2012 VQ scan: Very low probability for pulmonary embolism.  Echo 08/06/12: LVEF A999333, grade 1 diastolic dysfunction.  RV recovered.  Echo 7/14: LVEF 50-55% Even monitor: lots of transmitted episodes. Tracing all NSR with no ectopy. 01/2014 EF 35-40%.    06/02/12 Potassium 4.8 Creatinine 1.8 06/15/12 Potassium 4.5 Creatinine 1.45 7/14 Potassium 4 Creatinine 1.46 12/08/2015: K 3.6 Creatinine 1.16  04/09/2016: K 3.9 Creatinine 1.48  05/27/2016: K 3.7 Creatinine 1.2   ROS: All systems negative except as listed in HPI,  PMH and Problem List.  Past Medical History:  Diagnosis Date  . Alcohol abuse   . Chronic combined systolic and diastolic heart failure, NYHA class 3 ([place]) 06/02/2012  . Chronic systolic heart failure ([place]) 06/02/2012  . Hypertension   . NICM (nonischemic cardiomyopathy) ([place]) 09/04/2012  . Psoriasis    Possible psoriasis in the past    Current Outpatient Prescriptions  Medication Sig Dispense Refill  . aspirin 81 MG EC tablet TAKE 1 TABLET (81 MG TOTAL) BY MOUTH DAILY. 30 tablet 3  . carvedilol (COREG) 25 MG tablet Take 1 tablet (25 mg total) by mouth 2 (two) times daily with a meal. 180 tablet 1  . furosemide (LASIX) 40 MG tablet Take 1 tablet (40 mg total) by mouth daily. 30 tablet 6  . isosorbide mononitrate (IMDUR) 30 MG 24 hr tablet Take 1 tablet (30 mg total) by mouth daily. 30 tablet 6  . sacubitril-valsartan (ENTRESTO) 49-51 MG Take 1 tablet by mouth 2 (two) times daily. 60 tablet 6  . triamcinolone cream (KENALOG) 0.5 % Apply 1 application topically 2 (two) times daily.    . colchicine 0.6 MG tablet Take 3 tablets now. If symptoms persist take 1 tablet every 12 hours until symptoms resolve. (Patient not taking: Reported on 06/25/2016) 10 tablet 1  . nitroGLYCERIN (NITROSTAT) 0.4 MG SL tablet PLACE 1 TABLET UNDER THE TONGUE EVERY 5 MINUTES AS NEEDED FOR CHEST PAIN. (Patient not taking:  Reported on 06/25/2016) 10 tablet 0   No current facility-administered medications for this encounter.      PHYSICAL EXAM: Vitals:   06/25/16 1035  BP: (!) 168/92  BP Location: Right Arm  Patient Position: Sitting  Cuff Size: Normal  Pulse: 68  SpO2: 95%  Weight: 206 lb 6.4 oz (93.6 kg)    General:  No resp difficulty. Ambulated in the clinic without difficulty.  HEENT: normal Neck: supple. JVP flat. Carotids 2+ bilaterally; no bruits. No lymphadenopathy or thryomegaly appreciated. Cor: PMI normal. Regular rate & rhythm. No rubs, gallops, murmurs.  Lungs: clear Abdomen: soft, nontender,  nondistended. No hepatosplenomegaly. No bruits or masses. Good bowel sounds. Aortic impulse not widened or overly prominent Extremities: no cyanosis, clubbing, rash, edema. Multiple areas of vitiligo and skin lesions Neuro: alert & orientedx3, cranial nerves grossly intact. Moves all 4 extremities w/o difficulty. Affect pleasant.    ASSESSMENT & PLAN: 1. Uncontrolled HTN- Elevated today. Continue current regimen and increase entresto 97/103 twice a day.   2.Chronic Systolic Heart Failure.  H/O NICM - 01/2016 Echo EF 35-40%. Repeat ECHO next visit.  NYHA II-III. Volume status ok. Continue lasix to 40 mg daily.  Continue coreg 12.5 mg twice a day.  Increase  entresto 97-103 mg twice a day. BMET today and in 1 week.  stable.  3. H/O syncope - resolved.  4. H/O ETOH- Drinking 3 alcoholic drinks a week. Encouraged to stop.  5. Chronic renal failure, class III- BMET today and in 1 week.  6. Pulmonary Nodule. - has CT scheduled later this month.   Follow up  6 weeks with an ECHO and Dr Tony Jude NP-C

## 2016-06-25 ENCOUNTER — Ambulatory Visit (HOSPITAL_COMMUNITY)
Admission: RE | Admit: 2016-06-25 | Discharge: 2016-06-25 | Disposition: A | Discharge status: Home / Self Care | Payer: Medicare Other | Source: Ambulatory Visit | Attending: Cardiology | Admitting: Cardiology

## 2016-06-25 VITALS — BP 168/92 | HR 68 | Wt 206.4 lb

## 2016-06-25 DIAGNOSIS — I110 Hypertensive heart disease with heart failure: Secondary | ICD-10-CM | POA: Diagnosis not present

## 2016-06-25 DIAGNOSIS — I429 Cardiomyopathy, unspecified: Secondary | ICD-10-CM | POA: Diagnosis not present

## 2016-06-25 DIAGNOSIS — N183 Chronic kidney disease, stage 3 (moderate): Secondary | ICD-10-CM | POA: Diagnosis not present

## 2016-06-25 DIAGNOSIS — I10 Essential (primary) hypertension: Secondary | ICD-10-CM | POA: Diagnosis not present

## 2016-06-25 DIAGNOSIS — F1010 Alcohol abuse, uncomplicated: Secondary | ICD-10-CM

## 2016-06-25 DIAGNOSIS — I5042 Chronic combined systolic (congestive) and diastolic (congestive) heart failure: Secondary | ICD-10-CM | POA: Diagnosis not present

## 2016-06-25 DIAGNOSIS — Z7982 Long term (current) use of aspirin: Secondary | ICD-10-CM | POA: Diagnosis not present

## 2016-06-25 DIAGNOSIS — R911 Solitary pulmonary nodule: Secondary | ICD-10-CM | POA: Insufficient documentation

## 2016-06-25 DIAGNOSIS — Z79899 Other long term (current) drug therapy: Secondary | ICD-10-CM | POA: Diagnosis not present

## 2016-06-25 DIAGNOSIS — I428 Other cardiomyopathies: Secondary | ICD-10-CM

## 2016-06-25 LAB — BASIC METABOLIC PANEL WITH GFR
Anion gap: 6 (ref 5–15)
BUN: 14 mg/dL (ref 6–20)
CO2: 30 mmol/L (ref 22–32)
Calcium: 8.7 mg/dL — ABNORMAL LOW (ref 8.9–10.3)
Chloride: 103 mmol/L (ref 101–111)
Creatinine, Ser: 1.33 mg/dL — ABNORMAL HIGH (ref 0.61–1.24)
GFR calc Af Amer: >60 mL/min
GFR calc non Af Amer: 56 mL/min — ABNORMAL LOW
Glucose, Bld: 110 mg/dL — ABNORMAL HIGH (ref 65–99)
Potassium: 3.7 mmol/L (ref 3.5–5.1)
Sodium: 139 mmol/L (ref 135–145)

## 2016-06-25 MED ORDER — SACUBITRIL-VALSARTAN 97-103 MG PO TABS: 1.0000 | ORAL_TABLET | Freq: Two times a day (BID) | ORAL | 3 refills | Status: DC

## 2016-06-25 NOTE — Progress Notes (Signed)
Advanced Heart Failure Medication Review by a Pharmacist  Does the patient  feel that his/her medications are working for him/her?  yes  Has the patient been experiencing any side effects to the medications prescribed?  no  Does the patient measure his/her own blood pressure or blood glucose at home?  no   Does the patient have any problems obtaining medications due to transportation or finances?   no  Understanding of regimen: good Understanding of indications: good Potential of compliance: good Patient understands to avoid NSAIDs. Patient understands to avoid decongestants.  Issues to address at subsequent visits: None   Pharmacist comments:  [name] is a pleasant 60 yo M presenting with a current medication list. He reports good compliance with his regimen but did state that he has been feeling more bloated recently and his weight has trended up about 6 lbs since his last visit. He did not have any other questions or concerns for me at this time.   Ruta Hinds. Velva Harman, PharmD, BCPS, CPP Clinical Pharmacist Pager: [phone] Phone: [phone] 06/25/2016 10:46 AM      Time with patient: 10 minutes Preparation and documentation time: 2 minutes Total time: 12 minutes

## 2016-06-25 NOTE — Patient Instructions (Signed)
INCREASE Entresto to 97/103 mg twice daily. Can "double up" on current tabs, new Rx will be the 97/103 mg tablets (you will take one tab twice daily of new Rx).  Routine lab work today. Will notify you of abnormal results, otherwise no news is good news!  Return in 1-2 weeks for repeat lab work.  Follow up 6 weeks with echocardiogram and appointment with Dr. Haroldine Laws.  Do the following things EVERYDAY: 1) Weigh yourself in the morning before breakfast. Write it down and keep it in a log. 2) Take your medicines as prescribed 3) Eat low salt foods-Limit salt (sodium) to 2000 mg per day.  4) Stay as active as you can everyday 5) Limit all fluids for the day to less than 2 liters

## 2016-06-27 MED FILL — ISOSORBIDE MN ER 30 MG TAB: 30 | 30 days supply | Qty: 30 | Fill #2

## 2016-07-01 ENCOUNTER — Other Ambulatory Visit: Payer: Self-pay | Admitting: Internal Medicine

## 2016-07-01 DIAGNOSIS — L309 Dermatitis, unspecified: Secondary | ICD-10-CM

## 2016-07-02 MED FILL — TRIAMCINOLONE 0.5% CREAM: 0.5 | 20 days supply | Qty: 30 | Fill #0

## 2016-07-12 ENCOUNTER — Ambulatory Visit (HOSPITAL_COMMUNITY)
Admission: RE | Admit: 2016-07-12 | Discharge: 2016-07-12 | Disposition: A | Discharge status: Home / Self Care | Payer: Medicare Other | Source: Ambulatory Visit | Attending: Cardiology | Admitting: Cardiology

## 2016-07-12 DIAGNOSIS — I5042 Chronic combined systolic (congestive) and diastolic (congestive) heart failure: Secondary | ICD-10-CM | POA: Diagnosis not present

## 2016-07-12 LAB — BASIC METABOLIC PANEL
ANION GAP: 8 (ref 5–15)
BUN: 14 mg/dL (ref 6–20)
CALCIUM: 8.9 mg/dL (ref 8.9–10.3)
CHLORIDE: 101 mmol/L (ref 101–111)
CO2: 31 mmol/L (ref 22–32)
Creatinine, Ser: 1.54 mg/dL — ABNORMAL HIGH (ref 0.61–1.24)
GFR calc non Af Amer: 47 mL/min — ABNORMAL LOW (ref >60)
GFR, EST AFRICAN AMERICAN: 54 mL/min — AB (ref >60)
Glucose, Bld: 117 mg/dL — ABNORMAL HIGH (ref 65–99)
Potassium: 3.9 mmol/L (ref 3.5–5.1)
Sodium: 140 mmol/L (ref 135–145)

## 2016-07-15 ENCOUNTER — Other Ambulatory Visit: Payer: Self-pay | Admitting: *Deleted

## 2016-07-15 DIAGNOSIS — M109 Gout, unspecified: Secondary | ICD-10-CM

## 2016-07-15 MED ORDER — COLCHICINE 0.6 MG PO TABS: ORAL_TABLET | ORAL | 1 refills | Status: DC

## 2016-07-15 MED FILL — COLCRYS 0.6 MG TABLET: 0.6 | 5 days supply | Qty: 10 | Fill #0

## 2016-07-15 NOTE — Telephone Encounter (Signed)
Patient request refill on gout medication. States he started having a flare over the weekend. Would like enough medication to last for 2 weeks as he will be going out of town. [place].

## 2016-07-19 MED FILL — COLCRYS 0.6 MG TABLET: 0.6 | 5 days supply | Qty: 10 | Fill #1

## 2016-07-29 ENCOUNTER — Other Ambulatory Visit: Payer: Self-pay | Admitting: Internal Medicine

## 2016-07-29 DIAGNOSIS — M109 Gout, unspecified: Secondary | ICD-10-CM

## 2016-07-29 MED FILL — CARVEDILOL 25 MG TABLET: 25 | 90 days supply | Qty: 180 | Fill #1

## 2016-07-29 MED FILL — ISOSORBIDE MN ER 30 MG TAB: 30 | 30 days supply | Qty: 30 | Fill #3

## 2016-07-29 MED FILL — COLCRYS 0.6 MG TABLET: 0.6 | 5 days supply | Qty: 10 | Fill #0

## 2016-08-07 ENCOUNTER — Ambulatory Visit (HOSPITAL_BASED_OUTPATIENT_CLINIC_OR_DEPARTMENT_OTHER)
Admission: RE | Admit: 2016-08-07 | Discharge: 2016-08-07 | Disposition: A | Discharge status: Home / Self Care | Payer: Medicare Other | Source: Ambulatory Visit | Attending: Internal Medicine | Admitting: Internal Medicine

## 2016-08-07 ENCOUNTER — Encounter (HOSPITAL_COMMUNITY): Payer: Self-pay | Admitting: Internal Medicine

## 2016-08-07 ENCOUNTER — Ambulatory Visit (HOSPITAL_COMMUNITY)
Admission: RE | Admit: 2016-08-07 | Discharge: 2016-08-07 | Disposition: A | Discharge status: Home / Self Care | Payer: Medicare Other | Source: Ambulatory Visit | Attending: Adult Health | Admitting: Adult Health

## 2016-08-07 VITALS — BP 134/72 | HR 58 | Wt 208.5 lb

## 2016-08-07 DIAGNOSIS — I10 Essential (primary) hypertension: Secondary | ICD-10-CM | POA: Diagnosis not present

## 2016-08-07 DIAGNOSIS — I509 Heart failure, unspecified: Secondary | ICD-10-CM | POA: Diagnosis present

## 2016-08-07 DIAGNOSIS — I5042 Chronic combined systolic (congestive) and diastolic (congestive) heart failure: Secondary | ICD-10-CM | POA: Insufficient documentation

## 2016-08-07 DIAGNOSIS — I110 Hypertensive heart disease with heart failure: Secondary | ICD-10-CM | POA: Diagnosis not present

## 2016-08-07 MED ORDER — SPIRONOLACTONE 25 MG PO TABS: 12.5000 mg | ORAL_TABLET | Freq: Every day | ORAL | 3 refills | Status: DC

## 2016-08-07 NOTE — Progress Notes (Signed)
*  PRELIMINARY RESULTS* Echocardiogram 2D Echocardiogram has been performed.  Leavy Cella 08/07/2016, 11:09 AM

## 2016-08-07 NOTE — Progress Notes (Signed)
ADVANCED HF CLINIC NOTE  Patient ID: [name], male   DOB: [birth year], 60 y.o.   MRN: [id number] PCP: [place].  HF MD: Dr Haroldine Laws   HPI: [name] is a 60 y.o. gentlemen with history of HTN, daily EtOH and previous diagnosis of biventricular heart failure with EF 15% with recovered EF. He has chronic atypical chest pain.   Today he returns HF follow up. Last visit entresto was increased to 97-103 mg twice a day. Overall feeling ok. Denies PND/Orthopnea.  Weight at home 200 pounds. Taking all medications.Continues to drink 3 alcholic drinks per week.    05/25/12 RHC/LHC  RA = 7  RV = 56/3/10  PA = 52/23 (34)  PCW = 17 v= 25  Fick cardiac output/index = 7.8/3.9  Thermo CO/CI = 6.1/3.0  PVR = 2.1 [place]  SVR = 1194  FA sat = 97%  PA sat = 79%, 80%  High SVC = 79%  EF 15%  Coronaries normal.  CPX 07/14/12: Peak VO2: 21.7ml/kg/min  % predicted peak VO2: 65.1%, VE/VCO2 slope: 25.0, OUES: 2.27, Peak RER: 1.12, Ventilatory Threshold: 12.4 % predicted peak VO2: 38.3%.    04/2012 Echo: LVEF 15%, diffuse hypokinesis. Grade 2 diastolic dysfunction. LA mildly dilated. RV moderately reduced. RA mildly dilated. PAPP 44 mmHg.  04/2012 Abd u/s: Small volume abdominal ascites. Cannot exclude mild cirrhosis.  04/2012 VQ scan: Very low probability for pulmonary embolism.  Echo 08/06/12: LVEF [id number], grade 1 diastolic dysfunction.  RV recovered.  Echo 7/14: LVEF 50-55% Even monitor: lots of transmitted episodes. Tracing all NSR with no ectopy. 01/2016 EF 35-40%.   07/2016: EF 35-40%.  06/02/12 Potassium 4.8 Creatinine 1.8 06/15/12 Potassium 4.5 Creatinine 1.45 7/14 Potassium 4 Creatinine 1.46 12/08/2015: K 3.6 Creatinine 1.16  04/09/2016: K 3.9 Creatinine 1.48  05/27/2016: K 3.7 Creatinine 1.2  07/12/2016: K 3.9 Creatinine 1.54   ROS: All systems negative except as listed in HPI, PMH and Problem List.  Past Medical History:  Diagnosis Date  . Alcohol abuse   . Chronic combined  systolic and diastolic heart failure, NYHA class 3 ([place]) 06/02/2012  . Chronic systolic heart failure ([place]) 06/02/2012  . Hypertension   . NICM (nonischemic cardiomyopathy) ([place]) 09/04/2012  . Psoriasis    Possible psoriasis in the past    Current Outpatient Prescriptions  Medication Sig Dispense Refill  . aspirin 81 MG EC tablet TAKE 1 TABLET (81 MG TOTAL) BY MOUTH DAILY. 30 tablet 3  . carvedilol (COREG) 25 MG tablet Take 1 tablet (25 mg total) by mouth 2 (two) times daily with a meal. 180 tablet 1  . COLCRYS 0.6 MG tablet TAKE 3 TABLETS BY MOUTH NOW. IF SYMPTOMS PERSIST TAKE 1 TABLET EVERY 12 HOURS UNTIL SYMPTOMS RESOLVE. 10 tablet 1  . furosemide (LASIX) 40 MG tablet Take 1 tablet (40 mg total) by mouth daily. 30 tablet 6  . isosorbide mononitrate (IMDUR) 30 MG 24 hr tablet Take 1 tablet (30 mg total) by mouth daily. 30 tablet 6  . sacubitril-valsartan (ENTRESTO) 97-103 MG Take 1 tablet by mouth 2 (two) times daily. 60 tablet 3  . triamcinolone cream (KENALOG) 0.5 % APPLY TOPICALLY DAILY AS NEEDED. 30 g 1  . nitroGLYCERIN (NITROSTAT) 0.4 MG SL tablet PLACE 1 TABLET UNDER THE TONGUE EVERY 5 MINUTES AS NEEDED FOR CHEST PAIN. (Patient not taking: Reported on 08/07/2016) 10 tablet 0   No current facility-administered medications for this encounter.      PHYSICAL EXAM: Vitals:  08/07/16 1113  BP: 134/72  Pulse: (!) 58  SpO2: 98%  Weight: 208 lb 8 oz (94.6 kg)    General:  Well appearing. Ambulated in the clinic without difficulty.  HEENT: normal Neck: supple. JVP flat. Carotids 2+ bilaterally; no bruits. No lymphadenopathy or thryomegaly appreciated. Cor: PMI normal. Regular rate & rhythm. No rubs, gallops, murmurs.  Lungs: clear Abdomen: soft, nontender, nondistended. No hepatosplenomegaly. No bruits or masses. Good bowel sounds.  Extremities: no cyanosis, clubbing, rash, edema. Multiple areas of vitiligo and skin lesions Neuro: alert & orientedx3, cranial nerves grossly intact.  Moves all 4 extremities w/o difficulty. Affect pleasant.    ASSESSMENT & PLAN: 1. Uncontrolled HTN- Elevated today. Continue current regimen and add spiro 12.5 daily   2.Chronic Systolic Heart Failure.  H/O NICM - 01/2016 Echo EF 35-40%.  Today ECHO discussed and reviewed . EF 35-40% NYHA II-III. Volume status ok. Continue lasix to 40 mg daily.  Continue coreg 12.5 mg twice a day. Heart rate 58 will not increase.  Continue  entresto 97-103 mg twice a day.  Add 12.5 mg spiro daily.  3. H/O syncope - resolved.  4. H/O ETOH- Drinking 3 alcoholic drinks a week. Encouraged to stop.  5. Chronic renal failure, class III- BMET today and in 1 week.  6. Pulmonary Nodule. - Stable on recent CT and has been for 3 years. Does not require follow up.   Follow up  10 days for BMET. Follow up in 2 months.   Amy Clegg NP-C   Patient seen and examined with Darrick Grinder, NP. We discussed all aspects of the encounter. I agree with the assessment and plan as stated above.   Echo reviewed personally. EF stable 35-40% Doing well NYHA II. Volume status looks good. BP remains slightly elevated. Add spiro 12.5 daily. Check labs today and 1 week.  Bensimhon, Daniel,MD 8:35 PM

## 2016-08-07 NOTE — Patient Instructions (Signed)
Begin Spironolactone 12.5 mg daily Labs today Follow-up 2 months

## 2016-08-16 ENCOUNTER — Ambulatory Visit (HOSPITAL_COMMUNITY)
Admission: RE | Admit: 2016-08-16 | Discharge: 2016-08-16 | Disposition: A | Discharge status: Home / Self Care | Payer: Medicare Other | Source: Ambulatory Visit | Attending: Cardiology | Admitting: Cardiology

## 2016-08-16 DIAGNOSIS — I5042 Chronic combined systolic (congestive) and diastolic (congestive) heart failure: Secondary | ICD-10-CM | POA: Insufficient documentation

## 2016-08-16 LAB — BASIC METABOLIC PANEL
Anion gap: 6 (ref 5–15)
BUN: 18 mg/dL (ref 6–20)
CHLORIDE: 104 mmol/L (ref 101–111)
CO2: 29 mmol/L (ref 22–32)
Calcium: 9 mg/dL (ref 8.9–10.3)
Creatinine, Ser: 1.36 mg/dL — ABNORMAL HIGH (ref 0.61–1.24)
GFR calc non Af Amer: 54 mL/min — ABNORMAL LOW (ref >60)
Glucose, Bld: 120 mg/dL — ABNORMAL HIGH (ref 65–99)
POTASSIUM: 4.7 mmol/L (ref 3.5–5.1)
SODIUM: 139 mmol/L (ref 135–145)

## 2016-08-25 ENCOUNTER — Other Ambulatory Visit: Payer: Self-pay | Admitting: Family Medicine

## 2016-08-25 MED FILL — COLCRYS 0.6 MG TABLET: 0.6 | 5 days supply | Qty: 10 | Fill #1

## 2016-08-26 ENCOUNTER — Other Ambulatory Visit: Payer: Self-pay | Admitting: *Deleted

## 2016-08-26 DIAGNOSIS — L309 Dermatitis, unspecified: Secondary | ICD-10-CM

## 2016-08-27 MED ORDER — TRIAMCINOLONE ACETONIDE 0.5 % EX CREA: TOPICAL_CREAM | CUTANEOUS | 1 refills | Status: DC

## 2016-08-27 MED FILL — TRIAMCINOLONE 0.5% CREAM: 0.5 | 15 days supply | Qty: 30 | Fill #0

## 2016-08-28 MED FILL — ISOSORBIDE MN ER 30 MG TAB: 30 | 30 days supply | Qty: 30 | Fill #4

## 2016-09-26 MED FILL — ISOSORBIDE MN ER 30 MG TAB: 30 | 30 days supply | Qty: 30 | Fill #5

## 2016-10-08 MED FILL — TRIAMCINOLONE 0.5% CREAM: 0.5 | 15 days supply | Qty: 30 | Fill #1

## 2016-10-14 ENCOUNTER — Other Ambulatory Visit (HOSPITAL_COMMUNITY): Payer: Self-pay | Admitting: Adult Health

## 2016-10-28 ENCOUNTER — Other Ambulatory Visit: Payer: Self-pay | Admitting: Internal Medicine

## 2016-10-28 DIAGNOSIS — M109 Gout, unspecified: Secondary | ICD-10-CM

## 2016-10-28 DIAGNOSIS — I5022 Chronic systolic (congestive) heart failure: Secondary | ICD-10-CM

## 2016-10-28 MED FILL — ISOSORBIDE MN ER 30 MG TAB: 30 | 30 days supply | Qty: 30 | Fill #6

## 2016-10-28 NOTE — Telephone Encounter (Signed)
Patient called requesting refills on medication:  COLCRYS 0.6 MG tablet   Per patient [name] requested fax also. Per patient out of medication  Please send to   [place], [place]  Please follow up with patient.

## 2016-10-29 MED ORDER — COLCHICINE 0.6 MG PO TABS: ORAL_TABLET | ORAL | 1 refills | Status: DC

## 2016-10-29 MED FILL — CARVEDILOL 25 MG TABLET: 25 | 90 days supply | Qty: 180 | Fill #0

## 2016-10-29 MED FILL — COLCHICINE 0.6 MG TABLET: 0.6 | 4 days supply | Qty: 10 | Fill #0

## 2016-11-01 MED FILL — COLCHICINE 0.6 MG TABLET: 0.6 | 4 days supply | Qty: 10 | Fill #1

## 2016-11-03 ENCOUNTER — Other Ambulatory Visit (HOSPITAL_COMMUNITY): Payer: Self-pay | Admitting: Adult Health

## 2016-11-08 ENCOUNTER — Other Ambulatory Visit: Payer: Self-pay | Admitting: *Deleted

## 2016-11-08 DIAGNOSIS — M109 Gout, unspecified: Secondary | ICD-10-CM

## 2016-11-08 MED ORDER — COLCHICINE 0.6 MG PO TABS: ORAL_TABLET | ORAL | 1 refills | Status: DC

## 2016-11-08 MED FILL — COLCHICINE 0.6 MG TABLET: 0.6 | 5 days supply | Qty: 10 | Fill #0

## 2016-11-21 ENCOUNTER — Telehealth: Payer: Self-pay | Admitting: Internal Medicine

## 2016-11-21 MED FILL — COLCHICINE 0.6 MG TABLET: 0.6 | 5 days supply | Qty: 10 | Fill #1

## 2016-11-21 NOTE — Telephone Encounter (Signed)
Insurance will not cover colchicine. Pt needs something similar that insurance will cover. Pt uses [facility]. ep

## 2016-11-21 NOTE — Telephone Encounter (Signed)
Please call patient back and let him know that his insurance should cover the brand name Colcrys at a tier 3.  If the cost is to much, patient should contact his insurance for possible alternatives.  Derl Barrow, RN

## 2016-11-21 NOTE — Telephone Encounter (Signed)
Pt was advised. Pharmacy recommended a coupon online making the medication free. ep

## 2016-12-02 ENCOUNTER — Other Ambulatory Visit (HOSPITAL_COMMUNITY): Payer: Self-pay | Admitting: Cardiology

## 2016-12-02 MED ORDER — ISOSORBIDE MONONITRATE ER 30 MG PO TB24: 30.0000 mg | ORAL_TABLET | Freq: Every day | ORAL | 0 refills | Status: DC

## 2016-12-02 MED FILL — ISOSORBIDE MN ER 30 MG TAB: 30 | 30 days supply | Qty: 30 | Fill #0

## 2016-12-04 ENCOUNTER — Other Ambulatory Visit (HOSPITAL_COMMUNITY): Payer: Self-pay | Admitting: Adult Health

## 2016-12-16 ENCOUNTER — Ambulatory Visit (INDEPENDENT_AMBULATORY_CARE_PROVIDER_SITE_OTHER): Payer: Medicare Other | Admitting: Internal Medicine

## 2016-12-16 VITALS — BP 125/80 | HR 65 | Temp 98.6°F | Ht 72.0 in | Wt 211.4 lb

## 2016-12-16 DIAGNOSIS — I5042 Chronic combined systolic (congestive) and diastolic (congestive) heart failure: Secondary | ICD-10-CM

## 2016-12-16 DIAGNOSIS — I159 Secondary hypertension, unspecified: Secondary | ICD-10-CM

## 2016-12-16 DIAGNOSIS — I5022 Chronic systolic (congestive) heart failure: Secondary | ICD-10-CM

## 2016-12-16 MED ORDER — ISOSORBIDE MONONITRATE ER 30 MG PO TB24: 30.0000 mg | ORAL_TABLET | Freq: Every day | ORAL | 0 refills | Status: DC

## 2016-12-16 MED ORDER — CARVEDILOL 25 MG PO TABS: ORAL_TABLET | ORAL | 1 refills | Status: DC

## 2016-12-16 MED ORDER — FUROSEMIDE 40 MG PO TABS: 40.0000 mg | ORAL_TABLET | Freq: Every day | ORAL | 3 refills | Status: DC

## 2016-12-16 MED ORDER — ASPIRIN 81 MG PO TBEC: DELAYED_RELEASE_TABLET | ORAL | 3 refills | Status: DC

## 2016-12-16 MED ORDER — SACUBITRIL-VALSARTAN 97-103 MG PO TABS: 1.0000 | ORAL_TABLET | Freq: Two times a day (BID) | ORAL | 3 refills | Status: DC

## 2016-12-16 MED ORDER — SPIRONOLACTONE 25 MG PO TABS: 12.5000 mg | ORAL_TABLET | Freq: Every day | ORAL | 2 refills | Status: DC

## 2016-12-16 NOTE — Progress Notes (Signed)
   [name] [name], MD Phone: [phone]  Reason For Visit:   # CHRONIC HTN: Reports no issues  Current Meds - Entresto Reports good compliance, took meds today. Tolerating well, w/o complaints. 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  Medications- reviewed and updated No additions to family history Social history- patient is a non- smoker  Objective: BP 125/80 (BP Location: Right Arm, Patient Position: Sitting, Cuff Size: Normal)   Pulse 65   Temp 98.6 F (37 C) (Oral)   Ht 6' (1.829 m)   Wt 211 lb 6.4 oz (95.9 kg)   SpO2 96%   BMI 28.67 kg/m  Gen: NAD, alert, cooperative with exam Cardio: regular rate and rhythm, S1S2 heard, no murmurs appreciated Pulm: clear to auscultation bilaterally, no wheezes, rhonchi or rales Extremities: warm, well perfused, No edema, cyanosis or clubbing;    Assessment/Plan: See problem based a/p  Hypertension Well controlled - Continue entresto   Chronic combined systolic and diastolic heart failure, NYHA class 3 ([facility]) Last ECHO EF of 35-40%, denies any symptoms of fluid overload, on exam euvolemic, 4 lbs weight gain since last visit in November- believe this is the natural fluctuation in weight and not volume overload- per discussion with patient NYHA 2 vs. 3 - Follow up with cardiology   - Follow up with me 1-2 months or sooner if developing any symptoms as discussed  - continue limiting salt etc Meds ordered this encounter  Medications  . spironolactone (ALDACTONE) 25 MG tablet    Sig: Take 0.5 tablets (12.5 mg total) by mouth daily.    Dispense:  15 tablet    Refill:  2  . carvedilol (COREG) 25 MG tablet    Sig: TAKE 1 TABLET BY MOUTH 2 TIMES DAILY WITH A MEAL.    Dispense:  180 tablet    Refill:  1  . furosemide (LASIX) 40 MG tablet    Sig: Take 1 tablet (40 mg total) by mouth daily.    Dispense:  90 tablet    Refill:  3  . sacubitril-valsartan (ENTRESTO) 97-103 MG    Sig: Take 1 tablet by mouth 2 (two) times daily.    Dispense:  60 tablet    Refill:  3  . isosorbide mononitrate (IMDUR) 30 MG 24 hr tablet    Sig: Take 1 tablet (30 mg total) by mouth daily. Needs OV    Dispense:  30 tablet    Refill:  0  . aspirin 81 MG EC tablet    Sig: TAKE 1 TABLET (81 MG TOTAL) BY MOUTH DAILY.    Dispense:  30 tablet    Refill:  3

## 2016-12-16 NOTE — Patient Instructions (Addendum)
Please follow up with cardiology. Please make an appointment to see me again in the next  One to two months. Overall looks like you are doing well.

## 2016-12-17 LAB — BASIC METABOLIC PANEL
BUN / CREAT RATIO: 14 (ref 10–24)
BUN: 19 mg/dL (ref 8–27)
CO2: 25 mmol/L (ref 18–29)
Calcium: 9 mg/dL (ref 8.6–10.2)
Chloride: 100 mmol/L (ref 96–106)
Creatinine, Ser: 1.36 mg/dL — ABNORMAL HIGH (ref 0.76–1.27)
GFR calc Af Amer: 64 mL/min/{1.73_m2} (ref >59)
GFR, EST NON AFRICAN AMERICAN: 55 mL/min/{1.73_m2} — AB (ref >59)
GLUCOSE: 106 mg/dL — AB (ref 65–99)
Potassium: 4.2 mmol/L (ref 3.5–5.2)
SODIUM: 141 mmol/L (ref 134–144)

## 2016-12-17 NOTE — Assessment & Plan Note (Addendum)
Last ECHO EF of 35-40%, denies any symptoms of fluid overload, on exam euvolemic, 4 lbs weight gain since last visit in November- believe this is the natural fluctuation in weight and not volume overload- per discussion with patient NYHA 2 vs. 3 - Follow up with cardiology  - Follow up with me 1-2 months or sooner if developing any symptoms as discussed  - continue limiting salt etc - BMET monitoring today Meds ordered this encounter  Medications  . spironolactone (ALDACTONE) 25 MG tablet    Sig: Take 0.5 tablets (12.5 mg total) by mouth daily.    Dispense:  15 tablet    Refill:  2  . carvedilol (COREG) 25 MG tablet    Sig: TAKE 1 TABLET BY MOUTH 2 TIMES DAILY WITH A MEAL.    Dispense:  180 tablet    Refill:  1  . furosemide (LASIX) 40 MG tablet    Sig: Take 1 tablet (40 mg total) by mouth daily.    Dispense:  90 tablet    Refill:  3  . sacubitril-valsartan (ENTRESTO) 97-103 MG    Sig: Take 1 tablet by mouth 2 (two) times daily.    Dispense:  60 tablet    Refill:  3  . isosorbide mononitrate (IMDUR) 30 MG 24 hr tablet    Sig: Take 1 tablet (30 mg total) by mouth daily. Needs OV    Dispense:  30 tablet    Refill:  0  . aspirin 81 MG EC tablet    Sig: TAKE 1 TABLET (81 MG TOTAL) BY MOUTH DAILY.    Dispense:  30 tablet    Refill:  3

## 2016-12-17 NOTE — Assessment & Plan Note (Signed)
Well controlled - Continue entresto

## 2016-12-24 ENCOUNTER — Encounter: Payer: Self-pay | Admitting: Internal Medicine

## 2016-12-31 ENCOUNTER — Ambulatory Visit (INDEPENDENT_AMBULATORY_CARE_PROVIDER_SITE_OTHER): Payer: Medicare Other | Admitting: *Deleted

## 2016-12-31 ENCOUNTER — Other Ambulatory Visit: Payer: Self-pay | Admitting: *Deleted

## 2016-12-31 ENCOUNTER — Encounter: Payer: Self-pay | Admitting: *Deleted

## 2016-12-31 VITALS — BP 136/74 | HR 57 | Temp 98.5°F | Resp 16 | Ht 72.0 in | Wt 210.4 lb

## 2016-12-31 DIAGNOSIS — Z114 Encounter for screening for human immunodeficiency virus [HIV]: Secondary | ICD-10-CM | POA: Diagnosis not present

## 2016-12-31 DIAGNOSIS — Z0000 Encounter for general adult medical examination without abnormal findings: Secondary | ICD-10-CM

## 2016-12-31 DIAGNOSIS — L309 Dermatitis, unspecified: Secondary | ICD-10-CM

## 2016-12-31 DIAGNOSIS — Z1211 Encounter for screening for malignant neoplasm of colon: Secondary | ICD-10-CM

## 2016-12-31 DIAGNOSIS — I5042 Chronic combined systolic (congestive) and diastolic (congestive) heart failure: Secondary | ICD-10-CM

## 2016-12-31 DIAGNOSIS — I5022 Chronic systolic (congestive) heart failure: Secondary | ICD-10-CM

## 2016-12-31 MED ORDER — TRIAMCINOLONE ACETONIDE 0.5 % EX CREA: TOPICAL_CREAM | CUTANEOUS | 1 refills | Status: DC

## 2016-12-31 MED ORDER — ISOSORBIDE MONONITRATE ER 30 MG PO TB24: 30.0000 mg | ORAL_TABLET | Freq: Every day | ORAL | 0 refills | Status: DC

## 2016-12-31 MED FILL — ISOSORBIDE MN ER 30 MG TAB: 30 | 30 days supply | Qty: 30 | Fill #0

## 2016-12-31 MED FILL — TRIAMCINOLONE 0.5% CREAM: 0.5 | 15 days supply | Qty: 30 | Fill #0

## 2016-12-31 NOTE — Patient Instructions (Signed)
Fall Prevention in the Home Falls can cause injuries. They can happen to people of all ages. There are many things you can do to make your home safe and to help prevent falls. What can I do on the outside of my home?  Regularly fix the edges of walkways and driveways and fix any cracks.  Remove anything that might make you trip as you walk through a door, such as a raised step or threshold.  Trim any bushes or trees on the path to your home.  Use bright outdoor lighting.  Clear any walking paths of anything that might make someone trip, such as rocks or tools.  Regularly check to see if handrails are loose or broken. Make sure that both sides of any steps have handrails.  Any raised decks and porches should have guardrails on the edges.  Have any leaves, snow, or ice cleared regularly.  Use sand or salt on walking paths during winter.  Clean up any spills in your garage right away. This includes oil or grease spills. What can I do in the bathroom?  Use night lights.  Install grab bars by the toilet and in the tub and shower. Do not use towel bars as grab bars.  Use non-skid mats or decals in the tub or shower.  If you need to sit down in the shower, use a plastic, non-slip stool.  Keep the floor dry. Clean up any water that spills on the floor as soon as it happens.  Remove soap buildup in the tub or shower regularly.  Attach bath mats securely with double-sided non-slip rug tape.  Do not have throw rugs and other things on the floor that can make you trip. What can I do in the bedroom?  Use night lights.  Make sure that you have a light by your bed that is easy to reach.  Do not use any sheets or blankets that are too big for your bed. They should not hang down onto the floor.  Have a firm chair that has side arms. You can use this for support while you get dressed.  Do not have throw rugs and other things on the floor that can make you trip. What can I do in  the kitchen?  Clean up any spills right away.  Avoid walking on wet floors.  Keep items that you use a lot in easy-to-reach places.  If you need to reach something above you, use a strong step stool that has a grab bar.  Keep electrical cords out of the way.  Do not use floor polish or wax that makes floors slippery. If you must use wax, use non-skid floor wax.  Do not have throw rugs and other things on the floor that can make you trip. What can I do with my stairs?  Do not leave any items on the stairs.  Make sure that there are handrails on both sides of the stairs and use them. Fix handrails that are broken or loose. Make sure that handrails are as long as the stairways.  Check any carpeting to make sure that it is firmly attached to the stairs. Fix any carpet that is loose or worn.  Avoid having throw rugs at the top or bottom of the stairs. If you do have throw rugs, attach them to the floor with carpet tape.  Make sure that you have a light switch at the top of the stairs and the bottom of the stairs. If you  do not have them, ask someone to add them for you. What else can I do to help prevent falls?  Wear shoes that:  Do not have high heels.  Have rubber bottoms.  Are comfortable and fit you well.  Are closed at the toe. Do not wear sandals.  If you use a stepladder:  Make sure that it is fully opened. Do not climb a closed stepladder.  Make sure that both sides of the stepladder are locked into place.  Ask someone to hold it for you, if possible.  Clearly mark and make sure that you can see:  Any grab bars or handrails.  First and last steps.  Where the edge of each step is.  Use tools that help you move around (mobility aids) if they are needed. These include:  Canes.  Walkers.  Scooters.  Crutches.  Turn on the lights when you go into a dark area. Replace any light bulbs as soon as they burn out.  Set up your furniture so you have a clear  path. Avoid moving your furniture around.  If any of your floors are uneven, fix them.  If there are any pets around you, be aware of where they are.  Review your medicines with your doctor. Some medicines can make you feel dizzy. This can increase your chance of falling. Ask your doctor what other things that you can do to help prevent falls. This information is not intended to replace advice given to you by your health care provider. Make sure you discuss any questions you have with your health care provider. Document Released: 07/13/2009 Document Revised: 02/22/2016 Document Reviewed: 10/21/2014 Elsevier Interactive Patient Education  2017 [name], [name] A healthy lifestyle and preventive care is important for your health and wellness. Ask your health care provider about what schedule of regular examinations is right for you. What should I know about weight and diet?  Eat a Healthy Diet  Eat plenty of vegetables, fruits, whole grains, low-fat dairy products, and lean protein.  Do not eat a lot of foods high in solid fats, added sugars, or salt. Maintain a Healthy Weight  Regular exercise can help you achieve or maintain a healthy weight. You should:  Do at least 150 minutes of exercise each week. The exercise should increase your heart rate and make you sweat (moderate-intensity exercise).  Do strength-training exercises at least twice a week. Watch Your Levels of Cholesterol and Blood Lipids  Have your blood tested for lipids and cholesterol every 5 years starting at 63 years of age. If you are at high risk for heart disease, you should start having your blood tested when you are 63 years old. You may need to have your cholesterol levels checked more often if:  Your lipid or cholesterol levels are high.  You are older than 63 years of age.  You are at high risk for heart disease. What should I know about cancer screening? Many types of cancers can  be detected early and may often be prevented. Lung Cancer  You should be screened every year for lung cancer if:  You are a current smoker who has smoked for at least 30 years.  You are a former smoker who has quit within the past 15 years.  Talk to your health care provider about your screening options, when you should start screening, and how often you should be screened. Colorectal Cancer  Routine colorectal cancer screening usually begins at 63 years  of age and should be repeated every 5-10 years until you are 63 years old. You may need to be screened more often if early forms of precancerous polyps or small growths are found. Your health care provider may recommend screening at an earlier age if you have risk factors for colon cancer.  Your health care provider may recommend using home test kits to check for hidden blood in the stool.  A small camera at the end of a tube can be used to examine your colon (sigmoidoscopy or colonoscopy). This checks for the earliest forms of colorectal cancer. Prostate and Testicular Cancer  Depending on your age and overall health, your health care provider may do certain tests to screen for prostate and testicular cancer.  Talk to your health care provider about any symptoms or concerns you have about testicular or prostate cancer. Skin Cancer  Check your skin from head to toe regularly.  Tell your health care provider about any new moles or changes in moles, especially if:  There is a change in a mole's size, shape, or color.  You have a mole that is larger than a pencil eraser.  Always use sunscreen. Apply sunscreen liberally and repeat throughout the day.  Protect yourself by wearing long sleeves, pants, a wide-brimmed hat, and sunglasses when outside. What should I know about heart disease, diabetes, and high blood pressure?  If you are 24-24 years of age, have your blood pressure checked every 3-5 years. If you are 63 years of age or  older, have your blood pressure checked every year. You should have your blood pressure measured twice-once when you are at a hospital or clinic, and once when you are not at a hospital or clinic. Record the average of the two measurements. To check your blood pressure when you are not at a hospital or clinic, you can use:  An automated blood pressure machine at a pharmacy.  A home blood pressure monitor.  Talk to your health care provider about your target blood pressure.  If you are between 76-79 years old, ask your health care provider if you should take aspirin to prevent heart disease.  Have regular diabetes screenings by checking your fasting blood sugar level.  If you are at a normal weight and have a low risk for diabetes, have this test once every three years after the age of 59.  If you are overweight and have a high risk for diabetes, consider being tested at a younger age or more often.  A one-time screening for abdominal aortic aneurysm (AAA) by ultrasound is recommended for men aged 54-75 years who are current or former smokers. What should I know about preventing infection? Hepatitis B  If you have a higher risk for hepatitis B, you should be screened for this virus. Talk with your health care provider to find out if you are at risk for hepatitis B infection. Hepatitis C  Blood testing is recommended for:  Everyone born from [birth year] through 1967.  Anyone with known risk factors for hepatitis C. Sexually Transmitted Diseases (STDs)  You should be screened each year for STDs including gonorrhea and chlamydia if:  You are sexually active and are younger than 63 years of age.  You are older than 63 years of age and your health care provider tells you that you are at risk for this type of infection.  Your sexual activity has changed since you were last screened and you are at an increased risk for chlamydia  or gonorrhea. Ask your health care provider if you are at  risk.  Talk with your health care provider about whether you are at high risk of being infected with HIV. Your health care provider may recommend a prescription medicine to help prevent HIV infection. What else can I do?  Schedule regular health, dental, and eye exams.  Stay current with your vaccines (immunizations).  Do not use any tobacco products, such as cigarettes, chewing tobacco, and e-cigarettes. If you need help quitting, ask your health care provider.  Limit alcohol intake to no more than 2 drinks per day. One drink equals 12 ounces of beer, 5 ounces of wine, or 1 ounces of hard liquor.  Do not use street drugs.  Do not share needles.  Ask your health care provider for help if you need support or information about quitting drugs.  Tell your health care provider if you often feel depressed.  Tell your health care provider if you have ever been abused or do not feel safe at home. This information is not intended to replace advice given to you by your health care provider. Make sure you discuss any questions you have with your health care provider. Document Released: 03/14/2008 Document Revised: 05/15/2016 Document Reviewed: 06/20/2015 Elsevier Interactive Patient Education  2017 [employer].

## 2016-12-31 NOTE — Progress Notes (Signed)
Subjective:   [name] is a 61 y.o. male who presents for an Initial Medicare Annual Wellness Visit. Cardiac Risk Factors include: advanced age (>21men, >57 women);hypertension;male gender;sedentary lifestyle    Objective:    Today's Vitals   12/31/16 1055  BP: 136/74  Pulse: (!) 57  Resp: 16  Temp: 98.5 F (36.9 C)  TempSrc: Oral  SpO2: 98%  Weight: 210 lb 6.4 oz (95.4 kg)  Height: 6' (1.829 m)  PainSc: 0-No pain   Body mass index is 28.54 kg/m.  Current Medications (verified) Outpatient Encounter Prescriptions as of 12/31/2016  Medication Sig  . aspirin 81 MG EC tablet TAKE 1 TABLET (81 MG TOTAL) BY MOUTH DAILY.  . carvedilol (COREG) 25 MG tablet TAKE 1 TABLET BY MOUTH 2 TIMES DAILY WITH A MEAL.  Marland Kitchen colchicine (COLCRYS) 0.6 MG tablet TAKE 3 TABLETS BY MOUTH NOW. IF SYMPTOMS PERSIST TAKE 1 TABLET EVERY 12 HOURS UNTIL SYMPTOMS RESOLVE.  . furosemide (LASIX) 40 MG tablet Take 1 tablet (40 mg total) by mouth daily.  . isosorbide mononitrate (IMDUR) 30 MG 24 hr tablet Take 1 tablet (30 mg total) by mouth daily. Needs OV  . nitroGLYCERIN (NITROSTAT) 0.4 MG SL tablet PLACE 1 TABLET UNDER THE TONGUE EVERY 5 MINUTES AS NEEDED FOR CHEST PAIN.  . sacubitril-valsartan (ENTRESTO) 97-103 MG Take 1 tablet by mouth 2 (two) times daily.  Marland Kitchen spironolactone (ALDACTONE) 25 MG tablet Take 0.5 tablets (12.5 mg total) by mouth daily.  Marland Kitchen triamcinolone cream (KENALOG) 0.5 % APPLY TOPICALLY DAILY AS NEEDED.   No facility-administered encounter medications on file as of 12/31/2016.     Allergies (verified) Patient has no known allergies.   History: Past Medical History:  Diagnosis Date  . Alcohol abuse   . CHF (congestive heart failure) ([place])   . Chronic combined systolic and diastolic heart failure, NYHA class 3 ([place]) 06/02/2012  . Chronic systolic heart failure ([place]) 06/02/2012  . Hypertension   . NICM (nonischemic cardiomyopathy) ([place]) 09/04/2012  . Psoriasis    Possible psoriasis in  the past   Past Surgical History:  Procedure Laterality Date  . LEFT AND RIGHT HEART CATHETERIZATION WITH CORONARY ANGIOGRAM N/A 05/25/2012   Procedure: LEFT AND RIGHT HEART CATHETERIZATION WITH CORONARY ANGIOGRAM;  Surgeon: Jolaine Artist, MD;  Location: [facility] CATH LAB;  Service: Cardiovascular;  Laterality: N/A;  . SKIN GRAFT     L arm got caught in machine in 1979   Family History  Problem Relation Age of Onset  . Heart failure Father   . Heart failure Other     Uncle. Possible MI?  Marland Kitchen Cancer Sister     Skin  . Cancer Brother   . Colon polyps Brother   . Colon polyps Brother    Social History   Occupational History  . Not on file.   Social History Main Topics  . Smoking status: Former Smoker    Years: 3.00    Types: Cigars    Quit date: 01/01/2012  . Smokeless tobacco: Never Used     Comment: Rare cigar (black-n-milds)  . Alcohol use 0.0 oz/week     Comment: 2-3 beers 3-4 days per week  . Drug use: No  . Sexual activity: Yes   Tobacco Counseling Patient is former smoker with no plans to restart  Activities of Daily Living In your present state of health, do you have any difficulty performing the following activities: 12/31/2016 04/09/2016  Hearing? N N  Vision? Aggie Moats  Difficulty concentrating or making decisions? Y N  Walking or climbing stairs? Y N  Dressing or bathing? N N  Doing errands, shopping? N N  Preparing Food and eating ? N -  Using the Toilet? N -  In the past six months, have you accidently leaked urine? N -  Do you have problems with loss of bowel control? N -  Managing your Medications? N -  Managing your Finances? N -  Housekeeping or managing your Housekeeping? Y -  Some recent data might be hidden   Home Safety:  My home has a working smoke alarm:  No, discussed importance of having one. Patient will look into it.           My home throw rugs have been fastened down to the floor or removed:  One large one with non-slip back. I have non-slip  mats in the bathtub and shower:  No, discussed obtaining          All my home's stairs have railings or bannisters: One level home with 8 outside steps with handrails           My home's floors, stairs and hallways are free from clutter, wires and cords:  No, patient states he has stuff all over. Discussed importance of having clear walkways to decrease fall risk. Given room by room guide   I wear seatbelts consistently:  Yes   Immunizations and Health Maintenance  There is no immunization history on file for this patient. Health Maintenance Due  Topic Date Due  . HIV Screening  03/29/1969  . TETANUS/TDAP  03/29/1973  . COLONOSCOPY  03/29/2004   HIV drawn today Referral for colonoscopy made Patient will obtain TDaP at local pharmacy  Patient Care Team: Asiyah Cletis Media, MD as PCP - General (Family Medicine) Jolaine Artist, MD as Consulting Physician (Cardiology)  Indicate any recent Medical Services you may have received from other than [facility] providers in the past year (date may be approximate).    Assessment:   This is a routine wellness examination for [facility].   Hearing/Vision screen  Hearing Screening   Method: Audiometry   125Hz  250Hz  500Hz  1000Hz  2000Hz  3000Hz  4000Hz  6000Hz  8000Hz   Right ear:   40 40 40  40    Left ear:   40 40 40  40      Dietary issues and exercise activities discussed: Current Exercise Habits: The patient does not participate in regular exercise at present [employer] work), Exercise limited by: cardiac condition(s) (Tires easily)  Goals    . Have colonoscopy (pt-stated)      Depression Screen PHQ 2/9 Scores 12/31/2016 12/16/2016 03/22/2016 12/08/2015  PHQ - 2 Score 2 0 0 0  PHQ- 9 Score 5 - - -  Patient given info on [facility] consults  Fall Risk Fall Risk  12/31/2016 12/16/2016 12/08/2015 05/06/2014  Falls in the past year? No No No No   TUG Test:  Done in 11 seconds. Patient used both hands to push out of chair and to sit back down.  Cognitive  Function: Mini-Cog  Passed with score 4/5  Screening Tests Health Maintenance  Topic Date Due  . HIV Screening  03/29/1969  . TETANUS/TDAP  03/29/1973  . COLONOSCOPY  03/29/2004  . INFLUENZA VACCINE  04/30/2017  . Hepatitis C Screening  Completed        Plan:    HIV drawn today Referral for colonoscopy made  During the course of the visit Lennie was educated and  counseled about the following appropriate screening and preventive services:   Vaccines to include Pneumoccal, Influenza, Td, Zostavax/Shingrix  Colorectal cancer screening  Cardiovascular disease screening  Diabetes screening  Nutrition counseling   Patient Instructions (the written plan) were given to the patient.   Velora Heckler, RN   12/31/2016

## 2017-01-01 LAB — HIV ANTIBODY (ROUTINE TESTING W REFLEX): HIV SCREEN 4TH GENERATION: NONREACTIVE

## 2017-01-01 NOTE — Progress Notes (Addendum)
I have reviewed Laurenze's note and agree with findings   [name], MD

## 2017-02-02 ENCOUNTER — Other Ambulatory Visit (HOSPITAL_COMMUNITY): Payer: Self-pay | Admitting: Adult Health

## 2017-02-07 ENCOUNTER — Telehealth: Payer: Self-pay

## 2017-02-07 NOTE — Telephone Encounter (Signed)
[name], [name] is a direct screening patient scheduled for a colonoscopy with Dr. Hilarie Fredrickson on 03/03/2017. MRN: [id number]. Echo performed on 08-07-2016 showed an LV EF of 35-40%. Please review the patients chart to see if the patient is eligable to have the procedure in the [place]. Thanks!   Riki Sheer, LPN

## 2017-02-07 NOTE — Telephone Encounter (Signed)
[name],  This pt is cleared for anesthetic care at [facility].   [name]

## 2017-02-08 ENCOUNTER — Other Ambulatory Visit (HOSPITAL_COMMUNITY): Payer: Self-pay | Admitting: Adult Health

## 2017-02-12 MED FILL — CARVEDILOL 25 MG TABLET: 25 | 90 days supply | Qty: 180 | Fill #1

## 2017-02-17 ENCOUNTER — Ambulatory Visit (AMBULATORY_SURGERY_CENTER): Payer: Self-pay

## 2017-02-17 VITALS — Ht 72.0 in | Wt 204.0 lb

## 2017-02-17 DIAGNOSIS — Z1211 Encounter for screening for malignant neoplasm of colon: Secondary | ICD-10-CM

## 2017-02-17 MED ORDER — NA SULFATE-K SULFATE-MG SULF 17.5-3.13-1.6 GM/177ML PO SOLN: 1.0000 | Freq: Once | ORAL | 0 refills | Status: AC

## 2017-02-17 MED FILL — SUPREP BOWEL PREP KIT: 17.5-3.13-1 | 1 days supply | Qty: 354 | Fill #0

## 2017-02-17 NOTE — Progress Notes (Signed)
Denies allergies to eggs or soy products. Denies complication of anesthesia or sedation. Denies use of weight loss medication. Denies use of O2.   Emmi instructions given for colonoscopy.  

## 2017-02-27 ENCOUNTER — Telehealth: Payer: Self-pay | Admitting: *Deleted

## 2017-02-27 NOTE — Telephone Encounter (Signed)
Spoke with Josh Monday CRNA to confirm patient's cardiac hx.  He stated that the ef is 35% so he could be done here.

## 2017-03-03 ENCOUNTER — Encounter: Payer: Self-pay | Admitting: Internal Medicine

## 2017-03-03 ENCOUNTER — Ambulatory Visit (AMBULATORY_SURGERY_CENTER): Payer: Medicare Other | Admitting: Internal Medicine

## 2017-03-03 VITALS — BP 138/75 | HR 53 | Temp 96.6°F | Resp 15 | Ht 72.0 in | Wt 211.0 lb

## 2017-03-03 DIAGNOSIS — D120 Benign neoplasm of cecum: Secondary | ICD-10-CM | POA: Diagnosis not present

## 2017-03-03 DIAGNOSIS — Z1212 Encounter for screening for malignant neoplasm of rectum: Secondary | ICD-10-CM | POA: Diagnosis not present

## 2017-03-03 DIAGNOSIS — K6389 Other specified diseases of intestine: Secondary | ICD-10-CM

## 2017-03-03 DIAGNOSIS — Z1211 Encounter for screening for malignant neoplasm of colon: Secondary | ICD-10-CM

## 2017-03-03 DIAGNOSIS — D128 Benign neoplasm of rectum: Secondary | ICD-10-CM

## 2017-03-03 DIAGNOSIS — E669 Obesity, unspecified: Secondary | ICD-10-CM | POA: Diagnosis not present

## 2017-03-03 DIAGNOSIS — D124 Benign neoplasm of descending colon: Secondary | ICD-10-CM

## 2017-03-03 DIAGNOSIS — D122 Benign neoplasm of ascending colon: Secondary | ICD-10-CM

## 2017-03-03 DIAGNOSIS — I10 Essential (primary) hypertension: Secondary | ICD-10-CM | POA: Diagnosis not present

## 2017-03-03 DIAGNOSIS — D129 Benign neoplasm of anus and anal canal: Secondary | ICD-10-CM

## 2017-03-03 DIAGNOSIS — I2510 Atherosclerotic heart disease of native coronary artery without angina pectoris: Secondary | ICD-10-CM | POA: Diagnosis not present

## 2017-03-03 MED ORDER — SODIUM CHLORIDE 0.9 % IV SOLN: 500.0000 mL | INTRAVENOUS | Status: DC

## 2017-03-03 NOTE — Op Note (Signed)
[facility] Patient Name: [name] Procedure Date: 03/03/2017 10:16 AM MRN: [id number] Endoscopist: Jerene Bears , MD Age: 61 Referring MD:  Date of Birth: 1956-05-31 Gender: Male Account #: [id number] Procedure:                Colonoscopy Indications:              Screening for colorectal malignant neoplasm, This                            is the patient's first colonoscopy Medicines:                Monitored Anesthesia Care Procedure:                Pre-Anesthesia Assessment:                           - Prior to the procedure, a History and Physical                            was performed, and patient medications and                            allergies were reviewed. The patient's tolerance of                            previous anesthesia was also reviewed. The risks                            and benefits of the procedure and the sedation                            options and risks were discussed with the patient.                            All questions were answered, and informed consent                            was obtained. Prior Anticoagulants: The patient has                            taken no previous anticoagulant or antiplatelet                            agents. ASA Grade Assessment: III - A patient with                            severe systemic disease. After reviewing the risks                            and benefits, the patient was deemed in                            satisfactory condition to undergo the procedure.  After obtaining informed consent, the colonoscope                            was passed under direct vision. Throughout the                            procedure, the patient's blood pressure, pulse, and                            oxygen saturations were monitored continuously. The                            Colonoscope was introduced through the anus and                            advanced to the the cecum,  identified by                            appendiceal orifice and ileocecal valve. The                            colonoscopy was performed without difficulty. The                            patient tolerated the procedure well. The quality                            of the bowel preparation was good. The ileocecal                            valve, appendiceal orifice, and rectum were                            photographed. Scope In: 10:38:12 AM Scope Out: 11:07:58 AM Scope Withdrawal Time: 0 hours 26 minutes 20 seconds  Total Procedure Duration: 0 hours 29 minutes 46 seconds  Findings:                 The digital rectal exam was normal.                           A 12 mm polyp was found in the cecum. The polyp was                            pedunculated. The polyp was removed with a hot                            snare. Resection and retrieval were complete.                           Two sessile polyps were found in the ascending                            colon. The polyps were 4 to 5 mm in size.  These                            polyps were removed with a cold snare. Resection                            and retrieval were complete.                           One 20 mm submucosal nodule was found in the                            ascending colon. Biopsies were taken with a cold                            forceps for histology.                           A 20 mm polyp was found in the descending colon.                            The polyp was pedunculated. The polyp was removed                            with a hot snare. Resection and retrieval were                            complete.                           A 5 mm polyp was found in the descending colon. The                            polyp was sessile. The polyp was removed with a                            cold snare. Resection and retrieval were complete.                           A 30 mm polyp was found in the rectum. The polyp                             was semi-pedunculated. The polyp was removed with a                            hot snare. Resection and retrieval were complete.                           A 3 mm polyp was found in the rectum. The polyp was                            sessile. The polyp was removed with a cold snare.  Resection and retrieval were complete.                           Internal hemorrhoids were found during                            retroflexion. The hemorrhoids were small. Complications:            No immediate complications. Estimated Blood Loss:     Estimated blood loss was minimal. Impression:               - One 12 mm polyp in the cecum, removed with a hot                            snare. Resected and retrieved.                           - Two 4 to 5 mm polyps in the ascending colon,                            removed with a cold snare. Resected and retrieved.                           - Submucosal nodule in the ascending colon.                            Biopsied.                           - One 20 mm polyp in the descending colon, removed                            with a hot snare. Resected and retrieved.                           - One 5 mm polyp in the descending colon, removed                            with a cold snare. Resected and retrieved.                           - One 30 mm polyp in the rectum, removed with a hot                            snare. Resected and retrieved.                           - One 3 mm polyp in the rectum, removed with a cold                            snare. Resected and retrieved.                           - Small internal hemorrhoids. Recommendation:           -  Patient has a contact number available for                            emergencies. The signs and symptoms of potential                            delayed complications were discussed with the                            patient. Return to normal activities  tomorrow.                            Written discharge instructions were provided to the                            patient.                           - Resume previous diet.                           - Continue present medications.                           - Await pathology results.                           - Repeat colonoscopy is recommended for                            surveillance. The colonoscopy date will be                            determined after pathology results from today's                            exam become available for review.                           - No ibuprofen, naproxen, or other non-steroidal                            anti-inflammatory drugs for 3 weeks after polyp                            removal. Jerene Bears, MD 03/03/2017 11:15:59 AM This report has been signed electronically.

## 2017-03-03 NOTE — Progress Notes (Signed)
Called to room to assist during endoscopic procedure.  Patient ID and intended procedure confirmed with present staff. Received instructions for my participation in the procedure from the performing physician.  

## 2017-03-03 NOTE — Progress Notes (Signed)
Report to PACU, RN, vss, BBS= Clear.  

## 2017-03-03 NOTE — Progress Notes (Signed)
Pt's states no medical or surgical changes since previsit or office visit. 

## 2017-03-03 NOTE — Patient Instructions (Signed)
YOU HAD AN ENDOSCOPIC PROCEDURE TODAY AT [facility]:   Refer to the procedure report that was given to you for any specific questions about what was found during the examination.  If the procedure report does not answer your questions, please call your gastroenterologist to clarify.  If you requested that your care partner not be given the details of your procedure findings, then the procedure report has been included in a sealed envelope for you to review at your convenience later.  YOU SHOULD EXPECT: Some feelings of bloating in the abdomen. Passage of more gas than usual.  Walking can help get rid of the air that was put into your GI tract during the procedure and reduce the bloating. If you had a lower endoscopy (such as a colonoscopy or flexible sigmoidoscopy) you may notice spotting of blood in your stool or on the toilet paper. If you underwent a bowel prep for your procedure, you may not have a normal bowel movement for a few days.  Please Note:  You might notice some irritation and congestion in your nose or some drainage.  This is from the oxygen used during your procedure.  There is no need for concern and it should clear up in a day or so.  SYMPTOMS TO REPORT IMMEDIATELY:   Following lower endoscopy (colonoscopy or flexible sigmoidoscopy):  Excessive amounts of blood in the stool  Significant tenderness or worsening of abdominal pains  Swelling of the abdomen that is new, acute  Fever of 100F or higher    For urgent or emergent issues, a gastroenterologist can be reached at any hour by calling [phone].   DIET:  We do recommend a small meal at first, but then you may proceed to your regular diet.  Drink plenty of fluids but you should avoid alcoholic beverages for 24 hours.  ACTIVITY:  You should plan to take it easy for the rest of today and you should NOT DRIVE or use heavy machinery until tomorrow (because of the sedation medicines used during the test).     FOLLOW UP: Our staff will call the number listed on your records the next business day following your procedure to check on you and address any questions or concerns that you may have regarding the information given to you following your procedure. If we do not reach you, we will leave a message.  However, if you are feeling well and you are not experiencing any problems, there is no need to return our call.  We will assume that you have returned to your regular daily activities without incident.  If any biopsies were taken you will be contacted by phone or by letter within the next 1-3 weeks.  Please call us at [phone] if you have not heard about the biopsies in 3 weeks.    SIGNATURES/CONFIDENTIALITY: You and/or your care partner have signed paperwork which will be entered into your electronic medical record.  These signatures attest to the fact that that the information above on your After Visit Summary has been reviewed and is understood.  Full responsibility of the confidentiality of this discharge information lies with you and/or your care-partner.   NO IBUPROFEN,NAPROXEN,OR OTHER NON STEROIDAL ANTI INFLAMMATORY PRODUCTS FOR 3 WEEKS AFTER POLYP REMOVAL  Information on polyps and hemorrhoids given to you today

## 2017-03-04 ENCOUNTER — Telehealth: Payer: Self-pay

## 2017-03-04 ENCOUNTER — Encounter: Payer: Self-pay | Admitting: Internal Medicine

## 2017-03-04 DIAGNOSIS — Z86010 Personal history of colonic polyps: Secondary | ICD-10-CM | POA: Insufficient documentation

## 2017-03-04 NOTE — Telephone Encounter (Signed)
  Follow up Call-  Call back number 03/03/2017  Post procedure Call Back phone  # [phone]  Permission to leave phone message Yes  Some recent data might be hidden     Patient questions:  Do you have a fever, pain , or abdominal swelling? No. Pain Score  0 *  Have you tolerated food without any problems? Yes.    Have you been able to return to your normal activities? Yes.    Do you have any questions about your discharge instructions: Diet   No. Medications  No. Follow up visit  No.  Do you have questions or concerns about your Care? No.  Actions: * If pain score is 4 or above: No action needed, pain <4.

## 2017-03-05 IMAGING — CR DG CHEST 2V
2 series · 2 of 2 positions shown · non-contrast
Comparison: 04/13/2013

CLINICAL DATA: LEFT side squeezing chest pain, shortness of breath,
loss of appetite and dry cough for 3 days, history non ischemic
cardiomyopathy, chronic systolic heart failure, hypertension

EXAM:
CHEST  2 VIEW

[chest pa]
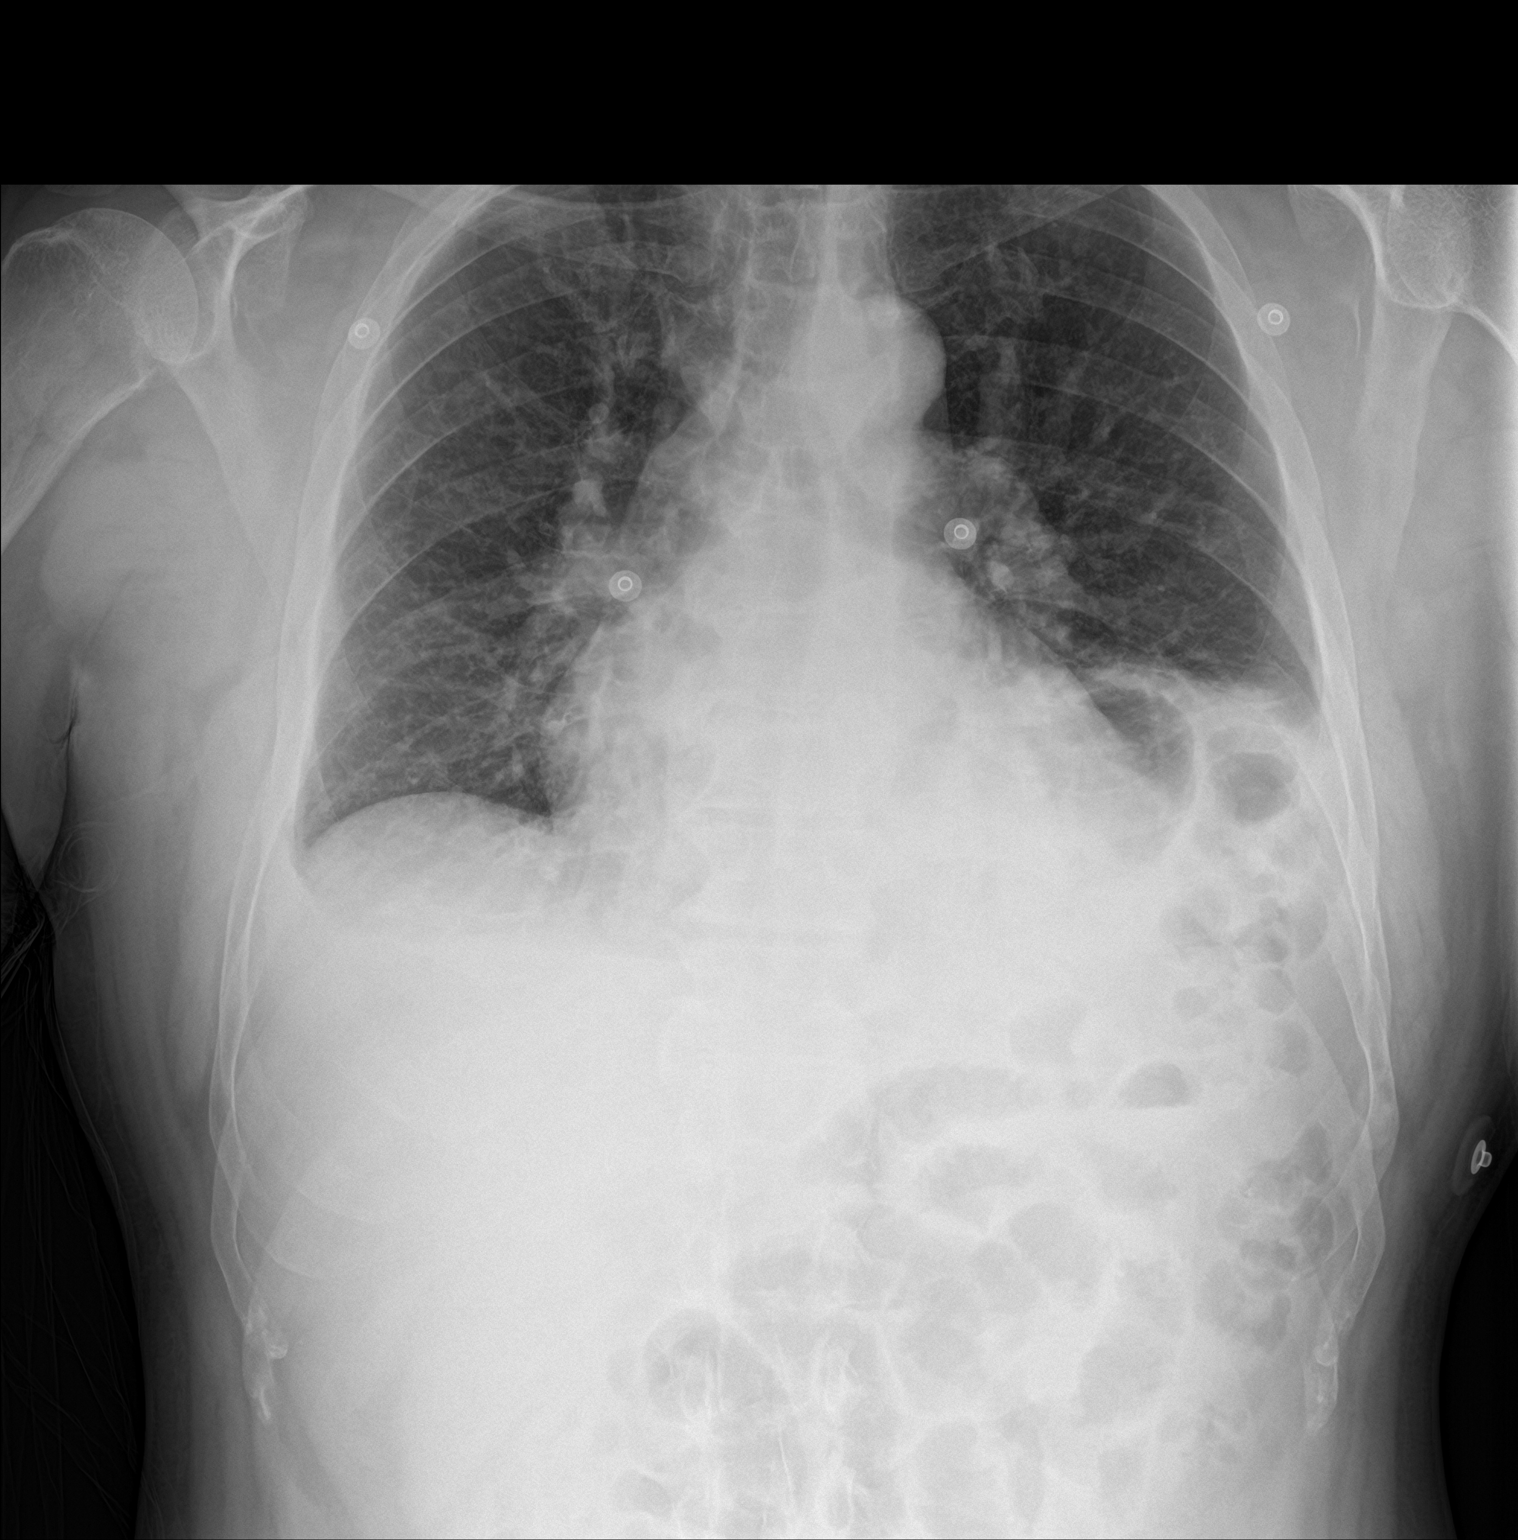

[chest lat]
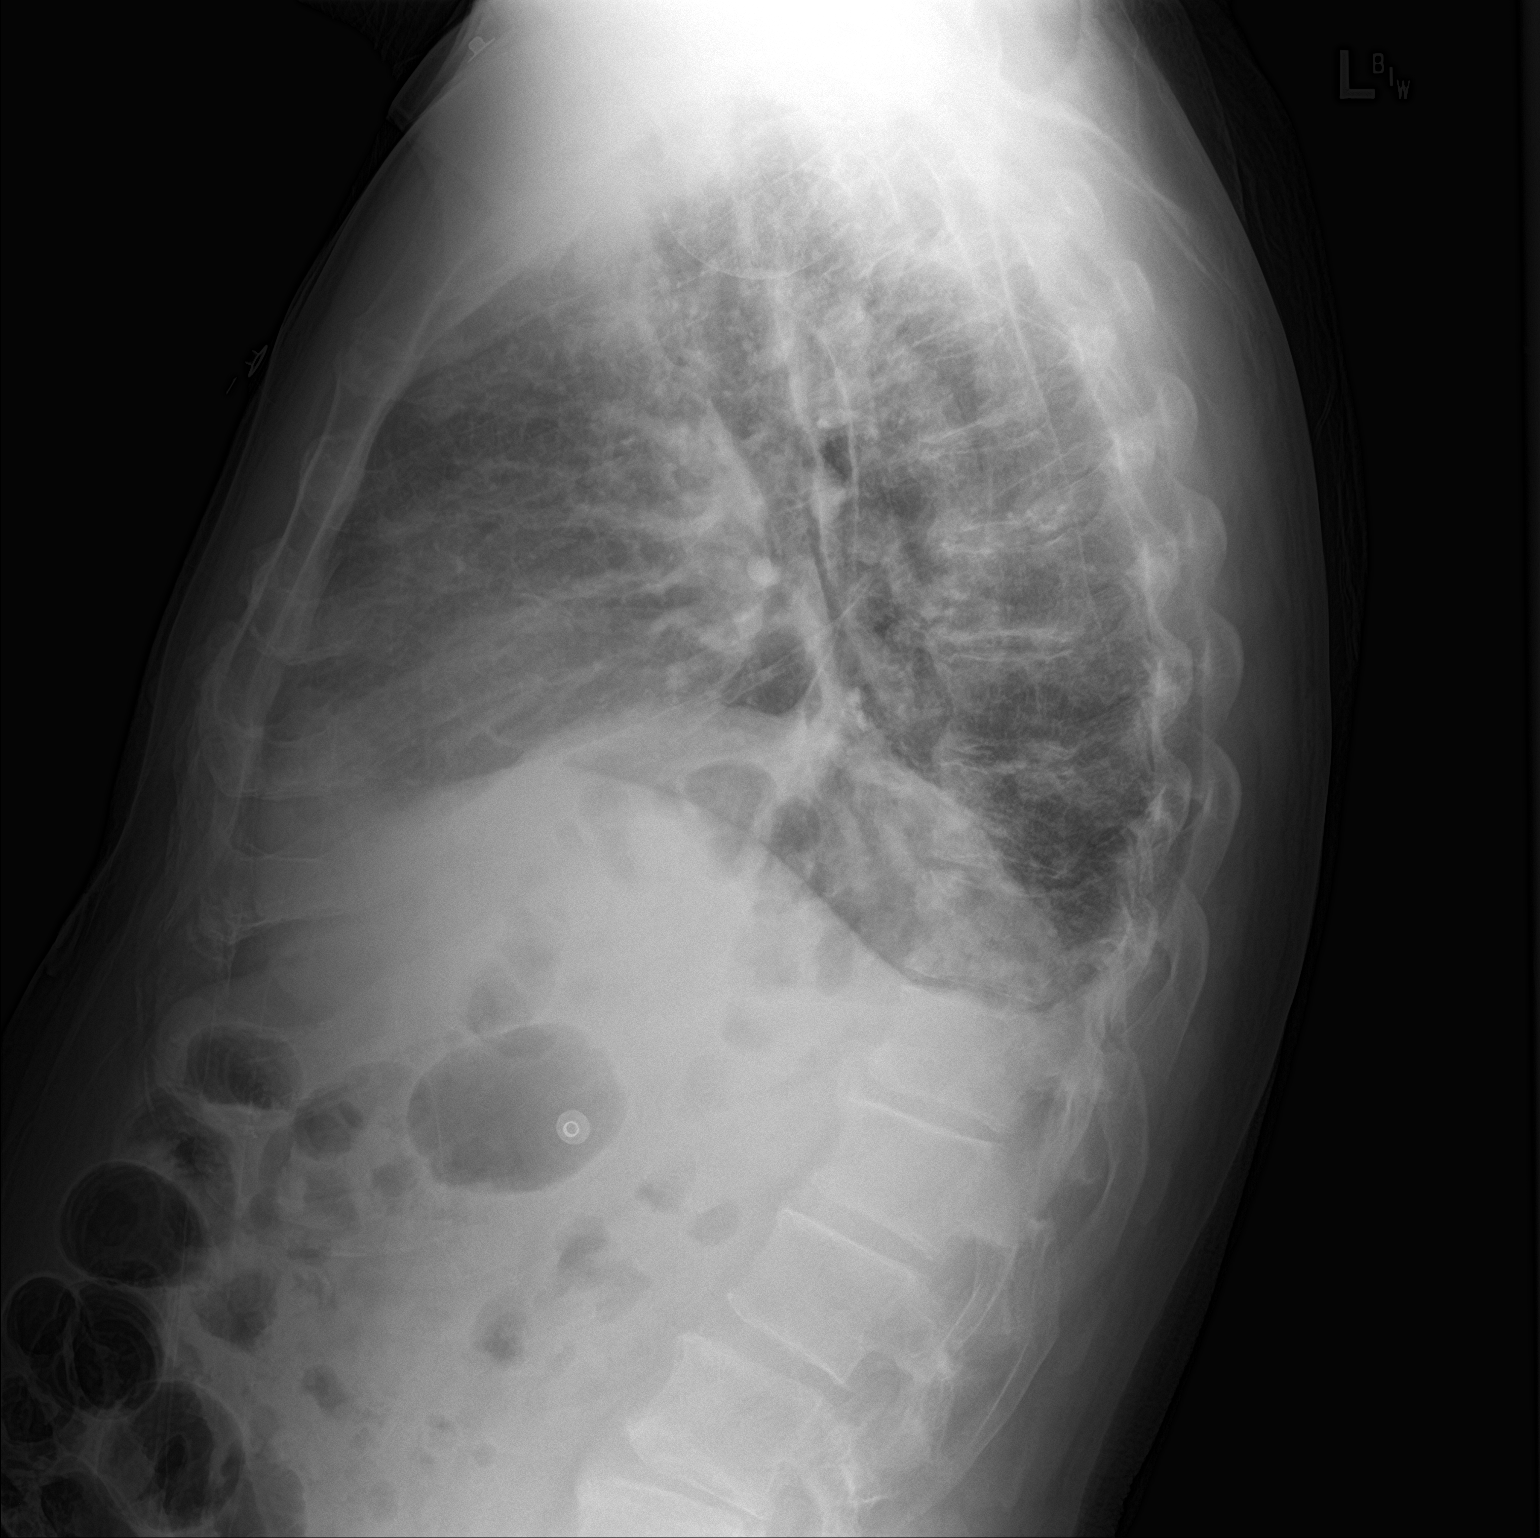

[2 of 2 positions shown; findings below may reference images not displayed]

FINDINGS: Enlargement of cardiac silhouette with pulmonary vascular
congestion.

Mediastinal contours normal.

Chronic elevation of LEFT diaphragm with increased LEFT basilar
atelectasis versus developing infiltrate.

No additional infiltrate, pleural effusion or pneumothorax.

Bones unremarkable.
IMPRESSION: Enlargement of cardiac silhouette with pulmonary vascular
congestion.

Increased atelectasis versus question developing infiltrate at LEFT
lung base.

## 2017-03-11 ENCOUNTER — Encounter: Payer: Self-pay | Admitting: Internal Medicine

## 2017-03-12 ENCOUNTER — Encounter: Payer: Self-pay | Admitting: Internal Medicine

## 2017-03-12 DIAGNOSIS — K635 Polyp of colon: Secondary | ICD-10-CM | POA: Insufficient documentation

## 2017-03-13 ENCOUNTER — Other Ambulatory Visit: Payer: Self-pay | Admitting: Family Medicine

## 2017-03-17 ENCOUNTER — Other Ambulatory Visit: Payer: Self-pay | Admitting: *Deleted

## 2017-03-17 DIAGNOSIS — L309 Dermatitis, unspecified: Secondary | ICD-10-CM

## 2017-03-17 MED ORDER — TRIAMCINOLONE ACETONIDE 0.5 % EX CREA: TOPICAL_CREAM | CUTANEOUS | 1 refills | Status: DC

## 2017-03-17 MED FILL — TRIAMCINOLONE 0.5% CREAM: 0.5 | 15 days supply | Qty: 30 | Fill #0

## 2017-04-27 ENCOUNTER — Other Ambulatory Visit (HOSPITAL_COMMUNITY): Payer: Self-pay | Admitting: Internal Medicine

## 2017-05-06 ENCOUNTER — Telehealth (HOSPITAL_COMMUNITY): Payer: Self-pay | Admitting: *Deleted

## 2017-05-06 ENCOUNTER — Telehealth (HOSPITAL_COMMUNITY): Payer: Self-pay | Admitting: Vascular Surgery

## 2017-05-06 DIAGNOSIS — I5042 Chronic combined systolic (congestive) and diastolic (congestive) heart failure: Secondary | ICD-10-CM

## 2017-05-06 DIAGNOSIS — I5022 Chronic systolic (congestive) heart failure: Secondary | ICD-10-CM

## 2017-05-06 NOTE — Telephone Encounter (Signed)
Refill isosorbide and carvedilol sent to [facility] pharmacy, Entresto, furosemide and spironolactone sent to [address]... Please advise

## 2017-05-06 NOTE — Telephone Encounter (Signed)
Patient called to schedule a follow up appointment.  I transferred him to scheduling line to schedule his appointment.

## 2017-05-07 MED ORDER — ISOSORBIDE MONONITRATE ER 30 MG PO TB24: 30.0000 mg | ORAL_TABLET | Freq: Every day | ORAL | 1 refills | Status: DC

## 2017-05-07 MED ORDER — FUROSEMIDE 40 MG PO TABS: 40.0000 mg | ORAL_TABLET | Freq: Every day | ORAL | 1 refills | Status: DC

## 2017-05-07 MED ORDER — SPIRONOLACTONE 25 MG PO TABS: 12.5000 mg | ORAL_TABLET | Freq: Every day | ORAL | 1 refills | Status: DC

## 2017-05-07 MED ORDER — CARVEDILOL 25 MG PO TABS: ORAL_TABLET | ORAL | 1 refills | Status: DC

## 2017-05-07 MED ORDER — SACUBITRIL-VALSARTAN 97-103 MG PO TABS: 1.0000 | ORAL_TABLET | Freq: Two times a day (BID) | ORAL | 1 refills | Status: DC

## 2017-05-07 MED FILL — ISOSORBIDE MN ER 30 MG TAB: 30 | 30 days supply | Qty: 30 | Fill #0

## 2017-05-07 MED FILL — CARVEDILOL 25 MG TABLET: 25 | 30 days supply | Qty: 60 | Fill #0

## 2017-05-07 NOTE — Telephone Encounter (Signed)
Refills returned and as requested meds sent to multiple pharmacy

## 2017-05-08 MED FILL — TRIAMCINOLONE 0.5% CREAM: 0.5 | 15 days supply | Qty: 30 | Fill #1

## 2017-06-24 MED FILL — CARVEDILOL 25 MG TABLET: 25 | 30 days supply | Qty: 60 | Fill #1

## 2017-06-25 MED FILL — ISOSORBIDE MN ER 30 MG TAB: 30 | 30 days supply | Qty: 30 | Fill #1

## 2017-07-04 ENCOUNTER — Ambulatory Visit (HOSPITAL_COMMUNITY)
Admission: RE | Admit: 2017-07-04 | Discharge: 2017-07-04 | Disposition: A | Discharge status: Home / Self Care | Payer: Medicare Other | Source: Ambulatory Visit | Attending: Internal Medicine | Admitting: Internal Medicine

## 2017-07-04 ENCOUNTER — Encounter (HOSPITAL_COMMUNITY): Payer: Self-pay | Admitting: Internal Medicine

## 2017-07-04 VITALS — BP 136/84 | HR 61 | Wt 195.0 lb

## 2017-07-04 DIAGNOSIS — N183 Chronic kidney disease, stage 3 unspecified: Secondary | ICD-10-CM

## 2017-07-04 DIAGNOSIS — I159 Secondary hypertension, unspecified: Secondary | ICD-10-CM

## 2017-07-04 DIAGNOSIS — O10012 Pre-existing essential hypertension complicating pregnancy, second trimester: Secondary | ICD-10-CM

## 2017-07-04 DIAGNOSIS — I5042 Chronic combined systolic (congestive) and diastolic (congestive) heart failure: Secondary | ICD-10-CM | POA: Diagnosis not present

## 2017-07-04 DIAGNOSIS — R911 Solitary pulmonary nodule: Secondary | ICD-10-CM

## 2017-07-04 DIAGNOSIS — I428 Other cardiomyopathies: Secondary | ICD-10-CM

## 2017-07-04 DIAGNOSIS — F1010 Alcohol abuse, uncomplicated: Secondary | ICD-10-CM

## 2017-07-04 LAB — ECHOCARDIOGRAM COMPLETE
E decel time: 331 msec
E/e' ratio: 5.64
FS: 18 % — AB (ref 28–44)
IV/PV OW: 1.06
LA ID, A-P, ES: 35 mm
LA vol A4C: 46.6 ml
LA vol index: 22.4 mL/m2
LA vol: 48.9 mL
LADIAMINDEX: 1.61 cm/m2
LDCA: 4.52 cm2
LEFT ATRIUM END SYS DIAM: 35 mm
LV PW d: 10.5 mm — AB (ref 0.6–1.1)
LV dias vol index: 38 mL/m2
LV dias vol: 82 mL (ref 62–150)
LV e' LATERAL: 6.85 cm/s
LV sys vol index: 22 mL/m2
LVEEAVG: 5.64
LVEEMED: 5.64
LVOTD: 24 mm
LVSYSVOL: 47 mL (ref 21–61)
MV Dec: 331
MV pk A vel: 80.6 m/s
MV pk E vel: 38.6 m/s
RV LATERAL S' VELOCITY: 12 cm/s
RV TAPSE: 16 mm
Simpson's disk: 43
Stroke v: 35 ml
TDI e' lateral: 6.85
TDI e' medial: 4.57

## 2017-07-04 MED ORDER — SPIRONOLACTONE 25 MG PO TABS: 25.0000 mg | ORAL_TABLET | Freq: Every day | ORAL | 1 refills | Status: DC

## 2017-07-04 NOTE — Progress Notes (Signed)
  Echocardiogram 2D Echocardiogram has been performed.  [name] 07/04/2017, 10:04 AM

## 2017-07-04 NOTE — Progress Notes (Signed)
ADVANCED HF CLINIC NOTE  Patient ID: [name], male   DOB: 12-03-1955, 61 y.o.   MRN: [id number] PCP: [place].  HF MD: Dr Haroldine Laws   HPI: [name] is a 61 y.o. gentlemen with history of HTN, daily EtOH and previous diagnosis of biventricular heart failure with EF 15% with recovered EF. He has chronic atypical chest pain.   He presents today for regular follow up.  Last visit spiro added. This was 07/2016. No follow up since. Weight down 13 lbs since last visit. Feeling good overall. Only SOB on flat ground if he gets in a hurry. No DOE if he takes his time.  BP has been well controlled on Entresto. Denies peripheral edema. Still has occasional orthopnea. Has occasional lightheadedness with rapid standing or standing in one spot for prolonged periods. Weight at home 190 and stable. Taking all medications as directed. Drinks 2-3 beers, 3-4 times a week.   Echo today shows EF ~ 50%  05/25/12 RHC/LHC  RA = 7  RV = 56/3/10  PA = 52/23 (34)  PCW = 17 v= 25  Fick cardiac output/index = 7.8/3.9  Thermo CO/CI = 6.1/3.0  PVR = 2.1 Woods  SVR = 1194  FA sat = 97%  PA sat = 79%, 80%  High SVC = 79%  EF 15%  Coronaries normal.  CPX 07/14/12: Peak VO2: 21.66ml/kg/min  % predicted peak VO2: 65.1%, VE/VCO2 slope: 25.0, OUES: 2.27, Peak RER: 1.12, Ventilatory Threshold: 12.4 % predicted peak VO2: 38.3%.    04/2012 Echo: LVEF 15%, diffuse hypokinesis. Grade 2 diastolic dysfunction. LA mildly dilated. RV moderately reduced. RA mildly dilated. PAPP 44 mmHg.  04/2012 Abd u/s: Small volume abdominal ascites. Cannot exclude mild cirrhosis.  04/2012 VQ scan: Very low probability for pulmonary embolism.  Echo 08/06/12: LVEF 74%, grade 1 diastolic dysfunction.  RV recovered.  Echo 7/14: LVEF 50-55% Even monitor: lots of transmitted episodes. Tracing all NSR with no ectopy. 01/2016 EF 35-40%.   07/2016: EF 35-40%.  Review of systems complete and found to be negative unless listed in HPI.     Past Medical History:  Diagnosis Date  . Alcohol abuse   . CHF (congestive heart failure) ([place])   . Chronic combined systolic and diastolic heart failure, NYHA class 3 ([place]) 06/02/2012  . Chronic systolic heart failure ([place]) 06/02/2012  . Hypertension   . NICM (nonischemic cardiomyopathy) ([place]) 09/04/2012  . Psoriasis    Possible psoriasis in the past    Current Outpatient Prescriptions  Medication Sig Dispense Refill  . aspirin 81 MG EC tablet TAKE 1 TABLET (81 MG TOTAL) BY MOUTH DAILY. 30 tablet 3  . carvedilol (COREG) 25 MG tablet TAKE 1 TABLET BY MOUTH 2 TIMES DAILY WITH A MEAL. 60 tablet 1  . colchicine 0.6 MG tablet Take 0.6 mg by mouth.  1  . furosemide (LASIX) 40 MG tablet Take 1 tablet (40 mg total) by mouth daily. 30 tablet 1  . isosorbide mononitrate (IMDUR) 30 MG 24 hr tablet Take 1 tablet (30 mg total) by mouth daily. Needs OV 30 tablet 1  . nitroGLYCERIN (NITROSTAT) 0.4 MG SL tablet PLACE 1 TABLET UNDER THE TONGUE EVERY 5 MINUTES AS NEEDED FOR CHEST PAIN. 10 tablet 0  . sacubitril-valsartan (ENTRESTO) 97-103 MG Take 1 tablet by mouth 2 (two) times daily. 60 tablet 1  . spironolactone (ALDACTONE) 25 MG tablet Take 0.5 tablets (12.5 mg total) by mouth daily. 15 tablet 1  . triamcinolone cream (KENALOG)  0.5 % APPLY TOPICALLY DAILY AS NEEDED. 30 g 1   Current Facility-Administered Medications  Medication Dose Route Frequency Provider Last Rate Last Dose  . 0.9 %  sodium chloride infusion  500 mL Intravenous Continuous Pyrtle, Tony Lines, MD         PHYSICAL EXAM: Vitals:   07/04/17 1004  BP: 136/84  Pulse: 61  SpO2: 100%  Weight: 195 lb (88.5 kg)   Wt Readings from Last 3 Encounters:  07/04/17 195 lb (88.5 kg)  03/03/17 211 lb (95.7 kg)  02/17/17 204 lb (92.5 kg)    General: Well appearing. No resp difficulty. HEENT: Normal Neck: Supple. JVP 5-6. Carotids 2+ bilat; no bruits. No thyromegaly or nodule noted. Cor: PMI nondisplaced. RRR, No M/G/R noted Lungs: CTAB,  normal effort. Abdomen: Soft, non-tender, non-distended, no HSM. No bruits or masses. +BS  Extremities: No cyanosis, clubbing, or edema. Multiple areas of vitiligo and skin lesions.   Neuro: Alert & orientedx3, cranial nerves grossly intact. moves all 4 extremities w/o difficulty. Affect pleasant   ASSESSMENT & PLAN: 1. Uncontrolled HTN- Elevated today. Continue current regimen and add spiro 12.5 daily   2.Chronic Systolic Heart Failure.  H/O NICM - 01/2016 Echo EF 35-40%.  - Todays Echo discussed and reviewed. EF nearly normal at ~ 50% - NYHA II symptoms.  - Volume status stable on exam.  - Continue lasix 40 mg daily.  - Continue coreg 12.5 mg BID.  - Continue entresto 97-103 mg twice a day.  - Increase spiro 25 mg daily. BMET 10 days.  3. H/O syncope  - No further.   4. H/O ETOH - Drinking on occasion. Encouraged complete cessation.  5. CKD stage III  - BMET today.   6. Pulmonary Nodule  - Was stable on CT x 3 years. No need for follow up at this time.   Doing well overall. Meds as above. Labs 10 days with increase spiro. RTC 6 months. Sooner with symptoms.   Tony Friar, PA-C   Patient seen and examined with the above-signed Advanced Practice Provider and/or Housestaff. I personally reviewed laboratory data, imaging studies and relevant notes. I independently examined the patient and formulated the important aspects of the plan. I have edited the note to reflect any of my changes or salient points. I have personally discussed the plan with the patient and/or family.  Stable NYHA II. Volume status looks good. Echo reviewed personally and EF 50%. BP on under end of normal range. Agree with increasing spiro. Follow BMET. Reinforced need to keep ETOH at a minimum.   Tony Bickers, MD  11:06 AM

## 2017-07-04 NOTE — Patient Instructions (Signed)
Increase Spironolactone 25 mg (1 tab) daily  Your physician recommends that you return for lab work in: 10 days  Your physician recommends that you schedule a follow-up appointment in: 6 months ( we will call you)

## 2017-07-07 ENCOUNTER — Other Ambulatory Visit: Payer: Self-pay | Admitting: Internal Medicine

## 2017-07-07 DIAGNOSIS — M109 Gout, unspecified: Secondary | ICD-10-CM

## 2017-07-07 MED FILL — COLCRYS 0.6 MG TABLET: 0.6 | 5 days supply | Qty: 10 | Fill #0

## 2017-07-14 ENCOUNTER — Ambulatory Visit (HOSPITAL_COMMUNITY)
Admission: RE | Admit: 2017-07-14 | Discharge: 2017-07-14 | Disposition: A | Discharge status: Home / Self Care | Payer: Medicare Other | Source: Ambulatory Visit | Attending: Cardiology | Admitting: Cardiology

## 2017-07-14 DIAGNOSIS — I5042 Chronic combined systolic (congestive) and diastolic (congestive) heart failure: Secondary | ICD-10-CM | POA: Diagnosis not present

## 2017-07-14 LAB — BASIC METABOLIC PANEL
ANION GAP: 9 (ref 5–15)
BUN: 24 mg/dL — ABNORMAL HIGH (ref 6–20)
CHLORIDE: 100 mmol/L — AB (ref 101–111)
CO2: 29 mmol/L (ref 22–32)
CREATININE: 1.7 mg/dL — AB (ref 0.61–1.24)
Calcium: 8.9 mg/dL (ref 8.9–10.3)
GFR calc non Af Amer: 41 mL/min — ABNORMAL LOW (ref >60)
GFR, EST AFRICAN AMERICAN: 48 mL/min — AB (ref >60)
Glucose, Bld: 89 mg/dL (ref 65–99)
POTASSIUM: 4.2 mmol/L (ref 3.5–5.1)
Sodium: 138 mmol/L (ref 135–145)

## 2017-07-21 ENCOUNTER — Other Ambulatory Visit (HOSPITAL_COMMUNITY): Payer: Self-pay | Admitting: Internal Medicine

## 2017-07-21 DIAGNOSIS — I5022 Chronic systolic (congestive) heart failure: Secondary | ICD-10-CM

## 2017-07-21 MED FILL — ISOSORBIDE MN ER 30 MG TAB: 30 | 30 days supply | Qty: 30 | Fill #0

## 2017-07-21 MED FILL — TRIAMCINOLONE 0.5% CREAM: 0.5 | 15 days supply | Qty: 30 | Fill #1

## 2017-07-21 NOTE — Progress Notes (Signed)
   [facility] Phone: [phone]   Date of Visit: 07/22/2017   HPI:  Dizzy/Syncopal Episode: - reports of intermittent lightheadedness since earlier this month. Denies sensation of the room spinning - symptoms are intermittent and occur every 2-4 days. It can last 2-3 days at a time  - initial episode happened on 10/5. He was getting ready to trim the hedges in his yard when he suddenly felt lightheaded. He had peripheral visual disturbance, sweating and shortness of breath. Denies any chest pain at that time. No nausea. This lasted a few minutes - reports that episodes can happen with prolonged standing but also sometimes happened when sitting. Reports he feels better if he keeps moving. - he reports that it seems that the symptoms are lasting longer since this started - he also had a syncopal episode a couple of days after the initial dizzy episode. He was walking back from the bathroom that night and suddenly had a syncopal episode without any prodromal symptoms. He denied any incontinence or injury to the tongue. He does not recall how long he was unconscious. No one witnessed the event. He thought he probably hit his right frontal head as he had some pain there.  - he has a history of syncope in 2014 and had a unremarkable 30 day event monitor per chart review - he also reports of having central lower sternal achy chest pain without radiation with shortness of breath since 10/5. It improves with sitting and resting. He reports he has had chest pain with activity "for years" where he has learned to limit his activity to avoid the chest pain. He reports that this may have gotten worse since 07/04/17.   Per chart review ECHO 07/04/17: EF 40-45%, Diffuse hypokinesis, G1DD, mild concentric hypertrophy Last stress test was in 2014; " no obvious cardiac limitation to exercise". Consider formal exercise training program and repeat testing in 3 months. Also consider full PFTs.     ROS: See HPI.  [place]:  NICM;Combined HF HTN Hx Syncope CKD3 Hx of Colon Polyps Pulmonary Nodule, Right: stable Alcohol Abuse  PHYSICAL EXAM: BP (!) 148/80   Pulse 62   Temp 98.2 F (36.8 C) (Oral)   Wt 196 lb (88.9 kg)   SpO2 96%   BMI 26.58 kg/m   Orthostatic Vitals: Lying 138/80 HR 64 Sitting 148/80 HR 62 Standing 142/82 HR 65 Gen: NAD, pleasant  HEENT: PERRL, EOMI, no nystagmus with dix hallpike maneuver Heart: RRR, no m/r/g Lungs: normal effort, CTAB Neuro: grossly non-focal.   ASSESSMENT/PLAN:  Health Maintenance:  - Flu vaccine  Intermittent Lightheadedness; Syncopal Episode:  No current symptoms. Blood pressures not consistent with orthostatic cause. No signs of vertigo. Considering if symptoms are cardiac related. EKG in 03/2016 with prolonged EKG. 30 day event monitor completed in 2014 was unremarkable. Patient would benefit from repeat holter monitor for further evaluation. I also asked him to mention his chest pain symptoms during singing in the choir with his cardiologist. Asked patient to make an appointment with cardiology as soon as possible. ED precautions discussed.    Smiley Houseman, MD PGY [place]

## 2017-07-22 ENCOUNTER — Ambulatory Visit (INDEPENDENT_AMBULATORY_CARE_PROVIDER_SITE_OTHER): Payer: Medicare Other | Admitting: Internal Medicine

## 2017-07-22 ENCOUNTER — Encounter: Payer: Self-pay | Admitting: Internal Medicine

## 2017-07-22 VITALS — BP 148/80 | HR 62 | Temp 98.2°F | Wt 196.0 lb

## 2017-07-22 DIAGNOSIS — Z23 Encounter for immunization: Secondary | ICD-10-CM | POA: Diagnosis present

## 2017-07-22 DIAGNOSIS — I5022 Chronic systolic (congestive) heart failure: Secondary | ICD-10-CM

## 2017-07-22 DIAGNOSIS — R42 Dizziness and giddiness: Secondary | ICD-10-CM | POA: Diagnosis not present

## 2017-07-22 DIAGNOSIS — R55 Syncope and collapse: Secondary | ICD-10-CM

## 2017-07-22 MED ORDER — CARVEDILOL 25 MG PO TABS: ORAL_TABLET | ORAL | 1 refills | Status: DC

## 2017-07-22 MED FILL — CARVEDILOL 25 MG TABLET: 25 | 30 days supply | Qty: 60 | Fill #0

## 2017-07-22 NOTE — Patient Instructions (Signed)
Please make an appointment with your heart doctor for a possible holter monitor to see if your dizziness and episode of passing out is cardiac related. Also I would mention about your chest pain to him as well.

## 2017-07-23 ENCOUNTER — Other Ambulatory Visit (HOSPITAL_COMMUNITY): Payer: Self-pay | Admitting: *Deleted

## 2017-07-23 DIAGNOSIS — I5042 Chronic combined systolic (congestive) and diastolic (congestive) heart failure: Secondary | ICD-10-CM

## 2017-07-23 MED ORDER — SPIRONOLACTONE 25 MG PO TABS: 25.0000 mg | ORAL_TABLET | Freq: Every day | ORAL | 3 refills | Status: DC

## 2017-08-25 ENCOUNTER — Other Ambulatory Visit: Payer: Self-pay | Admitting: Internal Medicine

## 2017-08-25 DIAGNOSIS — L309 Dermatitis, unspecified: Secondary | ICD-10-CM

## 2017-08-25 DIAGNOSIS — I5042 Chronic combined systolic (congestive) and diastolic (congestive) heart failure: Secondary | ICD-10-CM

## 2017-08-25 DIAGNOSIS — I5022 Chronic systolic (congestive) heart failure: Secondary | ICD-10-CM

## 2017-08-25 MED FILL — CARVEDILOL 25 MG TABLET: 25 | 30 days supply | Qty: 60 | Fill #1

## 2017-08-26 MED FILL — ISOSORBIDE MN ER 30 MG TAB: 30 | 30 days supply | Qty: 30 | Fill #0

## 2017-08-26 MED FILL — TRIAMCINOLONE 0.5% CREAM: 0.5 | 15 days supply | Qty: 30 | Fill #0

## 2017-09-17 ENCOUNTER — Other Ambulatory Visit (HOSPITAL_COMMUNITY): Payer: Self-pay | Admitting: *Deleted

## 2017-09-17 DIAGNOSIS — I5022 Chronic systolic (congestive) heart failure: Secondary | ICD-10-CM

## 2017-09-17 DIAGNOSIS — I5042 Chronic combined systolic (congestive) and diastolic (congestive) heart failure: Secondary | ICD-10-CM

## 2017-09-17 MED ORDER — FUROSEMIDE 40 MG PO TABS: 40.0000 mg | ORAL_TABLET | Freq: Every day | ORAL | 0 refills | Status: DC

## 2017-09-17 MED ORDER — ISOSORBIDE MONONITRATE ER 30 MG PO TB24: 30.0000 mg | ORAL_TABLET | Freq: Every day | ORAL | 0 refills | Status: DC

## 2017-09-17 MED ORDER — CARVEDILOL 25 MG PO TABS: ORAL_TABLET | ORAL | 0 refills | Status: DC

## 2017-09-17 MED ORDER — SACUBITRIL-VALSARTAN 97-103 MG PO TABS: 1.0000 | ORAL_TABLET | Freq: Two times a day (BID) | ORAL | 0 refills | Status: DC

## 2017-09-18 MED FILL — CARVEDILOL 25 MG TABLET: 25 | 30 days supply | Qty: 60 | Fill #0

## 2017-09-19 MED FILL — ISOSORBIDE MN ER 30 MG TAB: 30 | 30 days supply | Qty: 30 | Fill #0

## 2017-10-07 ENCOUNTER — Ambulatory Visit (HOSPITAL_COMMUNITY)
Admission: RE | Admit: 2017-10-07 | Discharge: 2017-10-07 | Disposition: A | Discharge status: Home / Self Care | Payer: Medicare Other | Source: Ambulatory Visit | Attending: Cardiology | Admitting: Cardiology

## 2017-10-07 VITALS — BP 136/76 | HR 68 | Wt 206.5 lb

## 2017-10-07 DIAGNOSIS — Z7982 Long term (current) use of aspirin: Secondary | ICD-10-CM | POA: Diagnosis not present

## 2017-10-07 DIAGNOSIS — N183 Chronic kidney disease, stage 3 unspecified: Secondary | ICD-10-CM

## 2017-10-07 DIAGNOSIS — I159 Secondary hypertension, unspecified: Secondary | ICD-10-CM

## 2017-10-07 DIAGNOSIS — I5082 Biventricular heart failure: Secondary | ICD-10-CM | POA: Diagnosis not present

## 2017-10-07 DIAGNOSIS — F1010 Alcohol abuse, uncomplicated: Secondary | ICD-10-CM | POA: Diagnosis not present

## 2017-10-07 DIAGNOSIS — R911 Solitary pulmonary nodule: Secondary | ICD-10-CM | POA: Insufficient documentation

## 2017-10-07 DIAGNOSIS — I5042 Chronic combined systolic (congestive) and diastolic (congestive) heart failure: Secondary | ICD-10-CM | POA: Diagnosis not present

## 2017-10-07 DIAGNOSIS — I428 Other cardiomyopathies: Secondary | ICD-10-CM | POA: Insufficient documentation

## 2017-10-07 DIAGNOSIS — Z79899 Other long term (current) drug therapy: Secondary | ICD-10-CM | POA: Insufficient documentation

## 2017-10-07 DIAGNOSIS — I130 Hypertensive heart and chronic kidney disease with heart failure and stage 1 through stage 4 chronic kidney disease, or unspecified chronic kidney disease: Secondary | ICD-10-CM | POA: Diagnosis not present

## 2017-10-07 DIAGNOSIS — I5022 Chronic systolic (congestive) heart failure: Secondary | ICD-10-CM | POA: Insufficient documentation

## 2017-10-07 LAB — BASIC METABOLIC PANEL
ANION GAP: 8 (ref 5–15)
BUN: 25 mg/dL — ABNORMAL HIGH (ref 6–20)
CHLORIDE: 107 mmol/L (ref 101–111)
CO2: 23 mmol/L (ref 22–32)
CREATININE: 1.39 mg/dL — AB (ref 0.61–1.24)
Calcium: 8.5 mg/dL — ABNORMAL LOW (ref 8.9–10.3)
GFR calc non Af Amer: 52 mL/min — ABNORMAL LOW (ref >60)
Glucose, Bld: 93 mg/dL (ref 65–99)
Potassium: 4 mmol/L (ref 3.5–5.1)
SODIUM: 138 mmol/L (ref 135–145)

## 2017-10-07 MED ORDER — ISOSORBIDE MONONITRATE ER 30 MG PO TB24: 30.0000 mg | ORAL_TABLET | Freq: Every day | ORAL | 6 refills | Status: DC

## 2017-10-07 MED ORDER — CARVEDILOL 25 MG PO TABS: ORAL_TABLET | ORAL | 6 refills | Status: DC

## 2017-10-07 MED ORDER — SACUBITRIL-VALSARTAN 97-103 MG PO TABS: 1.0000 | ORAL_TABLET | Freq: Two times a day (BID) | ORAL | 6 refills | Status: DC

## 2017-10-07 MED ORDER — SPIRONOLACTONE 25 MG PO TABS
25.0000 mg | ORAL_TABLET | Freq: Every day | ORAL | 3 refills | Status: DC
Start: 2017-10-07 — End: 2017-10-24

## 2017-10-07 MED ORDER — FUROSEMIDE 40 MG PO TABS: 40.0000 mg | ORAL_TABLET | Freq: Every day | ORAL | 6 refills | Status: DC

## 2017-10-07 NOTE — Patient Instructions (Signed)
Routine lab work today. Will notify you of abnormal results, otherwise no news is good news!  No changes to medication at this time.  Follow up 6 months with Dr. Haroldine Laws. We will call you closer to this time, or you may call our office to schedule 1 month before you are due to be seen. Take all medication as prescribed the day of your appointment. Bring all medications with you to your appointment.  Do the following things EVERYDAY: 1) Weigh yourself in the morning before breakfast. Write it down and keep it in a log. 2) Take your medicines as prescribed 3) Eat low salt foods-Limit salt (sodium) to 2000 mg per day.  4) Stay as active as you can everyday 5) Limit all fluids for the day to less than 2 liters

## 2017-10-07 NOTE — Progress Notes (Signed)
ADVANCED HF CLINIC NOTE  Patient ID: [name], male   DOB: November 23, 1955, 62 y.o.   MRN: [id number] PCP: [place].  HF MD: Dr Haroldine Laws   HPI: [name] is a 62 y.o. gentlemen with history of HTN, daily EtOH and previous diagnosis of biventricular heart failure with EF 15% with recovered EF. He has chronic atypical chest pain.   He presents today for follow up. Feeling good overall.  He states after he left last visit he had an episode of lightheadedness later that day with no clear trigger. No syncope. He has occasional mild lightheadedness with rapid standing. None today. Has chronic 3 pillow orthopnea. Does his yard with only occasional SOB. Still drinking occasional ETOH. Taking all medications as directed.   Echo 06/2017 EF ~ 50%  05/25/12 RHC/LHC  RA = 7  RV = 56/3/10  PA = 52/23 (34)  PCW = 17 v= 25  Fick cardiac output/index = 7.8/3.9  Thermo CO/CI = 6.1/3.0  PVR = 2.1 Woods  SVR = 1194  FA sat = 97%  PA sat = 79%, 80%  High SVC = 79%  EF 15%  Coronaries normal.  CPX 07/14/12: Peak VO2: 21.18ml/kg/min  % predicted peak VO2: 65.1%, VE/VCO2 slope: 25.0, OUES: 2.27, Peak RER: 1.12, Ventilatory Threshold: 12.4 % predicted peak VO2: 38.3%.    04/2012 Echo: LVEF 15%, diffuse hypokinesis. Grade 2 diastolic dysfunction. LA mildly dilated. RV moderately reduced. RA mildly dilated. PAPP 44 mmHg.  04/2012 Abd u/s: Small volume abdominal ascites. Cannot exclude mild cirrhosis.  04/2012 VQ scan: Very low probability for pulmonary embolism.  Echo 08/06/12: LVEF 76%, grade 1 diastolic dysfunction.  RV recovered.  Echo 7/14: LVEF 50-55% Even monitor: lots of transmitted episodes. Tracing all NSR with no ectopy. 01/2016 EF 35-40%.   07/2016: EF 35-40%.  Review of systems complete and found to be negative unless listed in HPI.    Past Medical History:  Diagnosis Date  . Alcohol abuse   . CHF (congestive heart failure) ([place])   . Chronic combined systolic and diastolic  heart failure, NYHA class 3 ([place]) 06/02/2012  . Chronic systolic heart failure ([place]) 06/02/2012  . Hypertension   . NICM (nonischemic cardiomyopathy) ([place]) 09/04/2012  . Psoriasis    Possible psoriasis in the past    Current Outpatient Medications  Medication Sig Dispense Refill  . aspirin 81 MG EC tablet TAKE 1 TABLET (81 MG TOTAL) BY MOUTH DAILY. 30 tablet 3  . carvedilol (COREG) 25 MG tablet TAKE 1 TABLET BY MOUTH 2 TIMES DAILY WITH A MEAL. 60 tablet 0  . colchicine 0.6 MG tablet TAKE 3 TABLETS BY MOUTH NOW. IF SYMPTOMS PERSIST TAKE 1 TABLET EVERY 12 HOURS UNTIL SYMPTOMS RESOLVE. 10 tablet 1  . furosemide (LASIX) 40 MG tablet Take 1 tablet (40 mg total) by mouth daily. 30 tablet 0  . isosorbide mononitrate (IMDUR) 30 MG 24 hr tablet Take 1 tablet (30 mg total) by mouth daily. 30 tablet 0  . nitroGLYCERIN (NITROSTAT) 0.4 MG SL tablet PLACE 1 TABLET UNDER THE TONGUE EVERY 5 MINUTES AS NEEDED FOR CHEST PAIN. 10 tablet 0  . sacubitril-valsartan (ENTRESTO) 97-103 MG Take 1 tablet by mouth 2 (two) times daily. 60 tablet 0  . spironolactone (ALDACTONE) 25 MG tablet Take 1 tablet (25 mg total) by mouth daily. 90 tablet 3  . triamcinolone cream (KENALOG) 0.5 % APPLY TOPICALLY DAILY AS NEEDED. 30 g 1   Current Facility-Administered Medications  Medication Dose Route Frequency  Provider Last Rate Last Dose  . 0.9 %  sodium chloride infusion  500 mL Intravenous Continuous [name], Lajuan Lines, MD       PHYSICAL EXAM: Vitals:   10/07/17 1357  BP: 136/76  Pulse: 68  SpO2: 98%  Weight: 206 lb 8 oz (93.7 kg)   Wt Readings from Last 3 Encounters:  10/07/17 206 lb 8 oz (93.7 kg)  07/22/17 196 lb (88.9 kg)  07/04/17 195 lb (88.5 kg)    General: Well appearing. No resp difficulty. HEENT: Normal Neck: Supple. JVP 5-6. Carotids 2+ bilat; no bruits. No thyromegaly or nodule noted. Cor: PMI nondisplaced. RRR, No M/G/R noted Lungs: CTAB, normal effort. Abdomen: Soft, non-tender, non-distended, no HSM. No  bruits or masses. +BS  Extremities: No cyanosis, clubbing, or rash. R and LLE no edema. Multiple areas of vitiligo and skin lesions.  Neuro: Alert & orientedx3, cranial nerves grossly intact. moves all 4 extremities w/o difficulty. Affect pleasant   ASSESSMENT & PLAN: 1. Uncontrolled HTN- Elevated today. Continue current regimen and add spiro 12.5 daily   2.Chronic Systolic Heart Failure.  H/O NICM - 01/2016 Echo EF 35-40%.  - Echo 06/2017 EF 40-45% (Closer to 50% per Dr. Haroldine Laws.)  - NYHA II-III symptoms chronically.  - Volume status stable on exam.  - Continue lasix 40 mg daily. Can take extra 40 mg as needed. - Continue coreg 25 mg BID - Continue entresto 97-103 mg BID. BMET today.  - Remains on imdur 30 mg daily for distant history of atypical chest pain.  - Continue spiro 25 mg daily.  3. H/O syncope  - No further. Episode of near-syncope in October was isoloated and has not recurred.  - Follow for now.   4. H/O ETOH - Drinking on occasion. Encouraged complete cessation.  5. CKD stage III  - BMET today.  6. Pulmonary Nodule  - Was stable on CT x 3 years. No need for further follow up at this time.   Doing well overall. BP stable at home so will continue current regimen with EF recovery. If needed, could consider hydralazine as he is on imdur with a distant history of atypical chest pain.   Shirley Friar, PA-C   Greater than 50% of the 30 minute visit was spent in counseling/coordination of care regarding disease state education, salt/fluid restriction, sliding scale diuretics, and medication compliance.

## 2017-10-24 ENCOUNTER — Other Ambulatory Visit (HOSPITAL_COMMUNITY): Payer: Self-pay | Admitting: *Deleted

## 2017-10-24 DIAGNOSIS — I5042 Chronic combined systolic (congestive) and diastolic (congestive) heart failure: Secondary | ICD-10-CM

## 2017-10-24 DIAGNOSIS — I5022 Chronic systolic (congestive) heart failure: Secondary | ICD-10-CM

## 2017-10-24 MED ORDER — SACUBITRIL-VALSARTAN 97-103 MG PO TABS: 1.0000 | ORAL_TABLET | Freq: Two times a day (BID) | ORAL | 6 refills | Status: DC

## 2017-10-24 MED ORDER — SPIRONOLACTONE 25 MG PO TABS: 25.0000 mg | ORAL_TABLET | Freq: Every day | ORAL | 3 refills | Status: DC

## 2017-10-24 MED ORDER — FUROSEMIDE 40 MG PO TABS: 40.0000 mg | ORAL_TABLET | Freq: Every day | ORAL | 6 refills | Status: DC

## 2017-10-24 MED ORDER — CARVEDILOL 25 MG PO TABS: ORAL_TABLET | ORAL | 6 refills | Status: DC

## 2017-10-24 MED ORDER — ISOSORBIDE MONONITRATE ER 30 MG PO TB24: 30.0000 mg | ORAL_TABLET | Freq: Every day | ORAL | 6 refills | Status: DC

## 2017-10-24 MED FILL — ISOSORBIDE MN ER 30 MG TAB: 30 | 30 days supply | Qty: 30 | Fill #0

## 2017-10-24 MED FILL — CARVEDILOL 25 MG TABLET: 25 | 30 days supply | Qty: 60 | Fill #0

## 2017-11-05 MED FILL — TRIAMCINOLONE 0.5% CREAM: 0.5 | 30 days supply | Qty: 30 | Fill #1

## 2017-11-27 MED FILL — ISOSORBIDE MN ER 30 MG TAB: 30 | 30 days supply | Qty: 30 | Fill #1

## 2017-11-27 MED FILL — CARVEDILOL 25 MG TABLET: 25 | 30 days supply | Qty: 60 | Fill #1

## 2017-12-30 ENCOUNTER — Other Ambulatory Visit: Payer: Self-pay | Admitting: Internal Medicine

## 2017-12-30 DIAGNOSIS — L309 Dermatitis, unspecified: Secondary | ICD-10-CM

## 2017-12-30 MED FILL — ISOSORBIDE MN ER 30 MG TAB: 30 | 30 days supply | Qty: 30 | Fill #2

## 2017-12-30 MED FILL — CARVEDILOL 25 MG TABLET: 25 | 30 days supply | Qty: 60 | Fill #2

## 2017-12-31 MED FILL — TRIAMCINOLONE 0.5% CREAM: 0.5 | 30 days supply | Qty: 30 | Fill #0

## 2018-01-06 ENCOUNTER — Ambulatory Visit (HOSPITAL_COMMUNITY)
Admission: RE | Admit: 2018-01-06 | Discharge: 2018-01-06 | Disposition: A | Discharge status: Home / Self Care | Payer: Medicare Other | Source: Ambulatory Visit | Attending: Family Medicine | Admitting: Family Medicine

## 2018-01-06 ENCOUNTER — Other Ambulatory Visit: Payer: Self-pay

## 2018-01-06 ENCOUNTER — Ambulatory Visit (INDEPENDENT_AMBULATORY_CARE_PROVIDER_SITE_OTHER): Payer: Medicare Other | Admitting: Internal Medicine

## 2018-01-06 ENCOUNTER — Encounter: Payer: Self-pay | Admitting: Internal Medicine

## 2018-01-06 VITALS — BP 104/60 | HR 58 | Temp 98.4°F | Ht 72.0 in | Wt 200.6 lb

## 2018-01-06 DIAGNOSIS — I5042 Chronic combined systolic (congestive) and diastolic (congestive) heart failure: Secondary | ICD-10-CM

## 2018-01-06 DIAGNOSIS — M545 Low back pain, unspecified: Secondary | ICD-10-CM | POA: Insufficient documentation

## 2018-01-06 DIAGNOSIS — R42 Dizziness and giddiness: Secondary | ICD-10-CM | POA: Diagnosis not present

## 2018-01-06 DIAGNOSIS — I110 Hypertensive heart disease with heart failure: Secondary | ICD-10-CM | POA: Diagnosis not present

## 2018-01-06 DIAGNOSIS — I5022 Chronic systolic (congestive) heart failure: Secondary | ICD-10-CM

## 2018-01-06 DIAGNOSIS — R55 Syncope and collapse: Secondary | ICD-10-CM

## 2018-01-06 DIAGNOSIS — N179 Acute kidney failure, unspecified: Secondary | ICD-10-CM | POA: Diagnosis not present

## 2018-01-06 MED ORDER — ISOSORBIDE MONONITRATE ER 30 MG PO TB24: 15.0000 mg | ORAL_TABLET | Freq: Every day | ORAL | 6 refills | Status: DC

## 2018-01-06 MED ORDER — CARVEDILOL 6.25 MG PO TABS: ORAL_TABLET | ORAL | 0 refills | Status: DC

## 2018-01-06 MED ORDER — ACETAMINOPHEN ER 650 MG PO TBCR: 650.0000 mg | EXTENDED_RELEASE_TABLET | Freq: Three times a day (TID) | ORAL | 0 refills | Status: DC | PRN

## 2018-01-06 MED FILL — CARVEDILOL 6.25 MG TABLET: 6.25 | 15 days supply | Qty: 30 | Fill #0
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
## 2018-01-06 NOTE — Patient Instructions (Addendum)
It was nice seeing you today.  I recommend for your back pain that you try Tylenol arthritis I have prescribed you this medication.  Also recommend that you start doing the exercises that I have prescribed below.  Please up your Lasix for 2 days, decrease your Coreg I have sent in a new medication and decrease your Imdur to half a pill.  Please follow-up with me in 2-3 days  Back Exercises If you have pain in your back, do these exercises 2-3 times each day or as told by your doctor. When the pain goes away, do the exercises once each day, but repeat the steps more times for each exercise (do more repetitions). If you do not have pain in your back, do these exercises once each day or as told by your doctor. Exercises Single Knee to Chest  Do these steps 3-5 times in a row for each leg: 1. Lie on your back on a firm bed or the floor with your legs stretched out. 2. Bring one knee to your chest. 3. Hold your knee to your chest by grabbing your knee or thigh. 4. Pull on your knee until you feel a gentle stretch in your lower back. 5. Keep doing the stretch for 10-30 seconds. 6. Slowly let go of your leg and straighten it.  Pelvic Tilt  Do these steps 5-10 times in a row: 1. Lie on your back on a firm bed or the floor with your legs stretched out. 2. Bend your knees so they point up to the ceiling. Your feet should be flat on the floor. 3. Tighten your lower belly (abdomen) muscles to press your lower back against the floor. This will make your tailbone point up to the ceiling instead of pointing down to your feet or the floor. 4. Stay in this position for 5-10 seconds while you gently tighten your muscles and breathe evenly.  Cat-Cow  Do these steps until your lower back bends more easily: 1. Get on your hands and knees on a firm surface. Keep your hands under your shoulders, and keep your knees under your hips. You may put padding under your knees. 2. Let your head hang down, and make your  tailbone point down to the floor so your lower back is round like the back of a cat. 3. Stay in this position for 5 seconds. 4. Slowly lift your head and make your tailbone point up to the ceiling so your back hangs low (sags) like the back of a cow. 5. Stay in this position for 5 seconds.  Press-Ups  Do these steps 5-10 times in a row: 1. Lie on your belly (face-down) on the floor. 2. Place your hands near your head, about shoulder-width apart. 3. While you keep your back relaxed and keep your hips on the floor, slowly straighten your arms to raise the top half of your body and lift your shoulders. Do not use your back muscles. To make yourself more comfortable, you may change where you place your hands. 4. Stay in this position for 5 seconds. 5. Slowly return to lying flat on the floor.  Bridges  Do these steps 10 times in a row: 1. Lie on your back on a firm surface. 2. Bend your knees so they point up to the ceiling. Your feet should be flat on the floor. 3. Tighten your butt muscles and lift your butt off of the floor until your waist is almost as high as your knees. If you  do not feel the muscles working in your butt and the back of your thighs, slide your feet 1-2 inches farther away from your butt. 4. Stay in this position for 3-5 seconds. 5. Slowly lower your butt to the floor, and let your butt muscles relax.  If this exercise is too easy, try doing it with your arms crossed over your chest. Belly Crunches  Do these steps 5-10 times in a row: 1. Lie on your back on a firm bed or the floor with your legs stretched out. 2. Bend your knees so they point up to the ceiling. Your feet should be flat on the floor. 3. Cross your arms over your chest. 4. Tip your chin a little bit toward your chest but do not bend your neck. 5. Tighten your belly muscles and slowly raise your chest just enough to lift your shoulder blades a tiny bit off of the floor. 6. Slowly lower your chest and  your head to the floor.  Back Lifts Do these steps 5-10 times in a row: 1. Lie on your belly (face-down) with your arms at your sides, and rest your forehead on the floor. 2. Tighten the muscles in your legs and your butt. 3. Slowly lift your chest off of the floor while you keep your hips on the floor. Keep the back of your head in line with the curve in your back. Look at the floor while you do this. 4. Stay in this position for 3-5 seconds. 5. Slowly lower your chest and your face to the floor.  Contact a doctor if:  Your back pain gets a lot worse when you do an exercise.  Your back pain does not lessen 2 hours after you exercise. If you have any of these problems, stop doing the exercises. Do not do them again unless your doctor says it is okay. Get help right away if:  You have sudden, very bad back pain. If this happens, stop doing the exercises. Do not do them again unless your doctor says it is okay. This information is not intended to replace advice given to you by your health care provider. Make sure you discuss any questions you have with your health care provider. Document Released: 10/19/2010 Document Revised: 02/22/2016 Document Reviewed: 11/10/2014 Elsevier Interactive Patient Education  [employer].

## 2018-01-07 ENCOUNTER — Telehealth: Payer: Self-pay

## 2018-01-07 ENCOUNTER — Other Ambulatory Visit: Payer: Self-pay

## 2018-01-07 ENCOUNTER — Emergency Department (HOSPITAL_COMMUNITY): Payer: Medicare Other

## 2018-01-07 ENCOUNTER — Encounter (HOSPITAL_COMMUNITY): Payer: Self-pay

## 2018-01-07 ENCOUNTER — Inpatient Hospital Stay (HOSPITAL_COMMUNITY)
Admission: EM | Admit: 2018-01-07 | Discharge: 2018-01-09 | DRG: 683 | Disposition: A | Discharge status: Home / Self Care | Payer: Medicare Other | Attending: Family Medicine | Admitting: Family Medicine

## 2018-01-07 ENCOUNTER — Telehealth: Payer: Self-pay | Admitting: Internal Medicine

## 2018-01-07 DIAGNOSIS — E875 Hyperkalemia: Secondary | ICD-10-CM | POA: Diagnosis present

## 2018-01-07 DIAGNOSIS — Z8371 Family history of colonic polyps: Secondary | ICD-10-CM

## 2018-01-07 DIAGNOSIS — Z7982 Long term (current) use of aspirin: Secondary | ICD-10-CM

## 2018-01-07 DIAGNOSIS — Z809 Family history of malignant neoplasm, unspecified: Secondary | ICD-10-CM

## 2018-01-07 DIAGNOSIS — Z79899 Other long term (current) drug therapy: Secondary | ICD-10-CM

## 2018-01-07 DIAGNOSIS — I5042 Chronic combined systolic (congestive) and diastolic (congestive) heart failure: Secondary | ICD-10-CM | POA: Diagnosis not present

## 2018-01-07 DIAGNOSIS — Z87891 Personal history of nicotine dependence: Secondary | ICD-10-CM

## 2018-01-07 DIAGNOSIS — E860 Dehydration: Secondary | ICD-10-CM | POA: Diagnosis present

## 2018-01-07 DIAGNOSIS — I440 Atrioventricular block, first degree: Secondary | ICD-10-CM | POA: Diagnosis present

## 2018-01-07 DIAGNOSIS — R42 Dizziness and giddiness: Secondary | ICD-10-CM

## 2018-01-07 DIAGNOSIS — Y92009 Unspecified place in unspecified non-institutional (private) residence as the place of occurrence of the external cause: Secondary | ICD-10-CM

## 2018-01-07 DIAGNOSIS — N179 Acute kidney failure, unspecified: Principal | ICD-10-CM

## 2018-01-07 DIAGNOSIS — Z8249 Family history of ischemic heart disease and other diseases of the circulatory system: Secondary | ICD-10-CM

## 2018-01-07 DIAGNOSIS — Z86010 Personal history of colonic polyps: Secondary | ICD-10-CM

## 2018-01-07 DIAGNOSIS — I110 Hypertensive heart disease with heart failure: Secondary | ICD-10-CM | POA: Diagnosis not present

## 2018-01-07 DIAGNOSIS — I428 Other cardiomyopathies: Secondary | ICD-10-CM | POA: Diagnosis present

## 2018-01-07 DIAGNOSIS — T502X5A Adverse effect of carbonic-anhydrase inhibitors, benzothiadiazides and other diuretics, initial encounter: Secondary | ICD-10-CM | POA: Diagnosis present

## 2018-01-07 DIAGNOSIS — I130 Hypertensive heart and chronic kidney disease with heart failure and stage 1 through stage 4 chronic kidney disease, or unspecified chronic kidney disease: Secondary | ICD-10-CM | POA: Diagnosis present

## 2018-01-07 DIAGNOSIS — R55 Syncope and collapse: Secondary | ICD-10-CM

## 2018-01-07 DIAGNOSIS — F1010 Alcohol abuse, uncomplicated: Secondary | ICD-10-CM | POA: Diagnosis present

## 2018-01-07 DIAGNOSIS — N183 Chronic kidney disease, stage 3 (moderate): Secondary | ICD-10-CM | POA: Diagnosis present

## 2018-01-07 DIAGNOSIS — M109 Gout, unspecified: Secondary | ICD-10-CM | POA: Diagnosis present

## 2018-01-07 DIAGNOSIS — I959 Hypotension, unspecified: Secondary | ICD-10-CM | POA: Diagnosis present

## 2018-01-07 LAB — CBC
HCT: 37.6 % — ABNORMAL LOW (ref 39.0–52.0)
Hematocrit: 39.8 % (ref 37.5–51.0)
Hemoglobin: 12.8 g/dL — ABNORMAL LOW (ref 13.0–17.7)
Hemoglobin: 12.3 g/dL — ABNORMAL LOW (ref 13.0–17.0)
MCH: 28.8 pg (ref 26.6–33.0)
MCH: 29.2 pg (ref 26.0–34.0)
MCHC: 32.2 g/dL (ref 31.5–35.7)
MCHC: 32.7 g/dL (ref 30.0–36.0)
MCV: 90 fL (ref 79–97)
MCV: 89.3 fL (ref 78.0–100.0)
PLATELETS: 200 10*3/uL (ref 150–379)
PLATELETS: 190 10*3/uL (ref 150–400)
RBC: 4.44 x10E6/uL (ref 4.14–5.80)
RBC: 4.21 MIL/uL — ABNORMAL LOW (ref 4.22–5.81)
RDW: 13.8 % (ref 12.3–15.4)
RDW: 13 % (ref 11.5–15.5)
WBC: 5.5 10*3/uL (ref 3.4–10.8)
WBC: 5 10*3/uL (ref 4.0–10.5)

## 2018-01-07 LAB — BASIC METABOLIC PANEL
Anion gap: 11 (ref 5–15)
BUN/Creatinine Ratio: 20 (ref 10–24)
BUN: 43 mg/dL — ABNORMAL HIGH (ref 8–27)
BUN: 52 mg/dL — AB (ref 6–20)
CHLORIDE: 104 mmol/L (ref 101–111)
CO2: 24 mmol/L (ref 20–29)
CO2: 21 mmol/L — ABNORMAL LOW (ref 22–32)
CREATININE: 2.41 mg/dL — AB (ref 0.61–1.24)
Calcium: 9.3 mg/dL (ref 8.6–10.2)
Calcium: 8.7 mg/dL — ABNORMAL LOW (ref 8.9–10.3)
Chloride: 101 mmol/L (ref 96–106)
Creatinine, Ser: 2.2 mg/dL — ABNORMAL HIGH (ref 0.76–1.27)
GFR calc Af Amer: 31 mL/min — ABNORMAL LOW (ref >60)
GFR, EST AFRICAN AMERICAN: 36 mL/min/{1.73_m2} — AB (ref >59)
GFR, EST NON AFRICAN AMERICAN: 31 mL/min/{1.73_m2} — AB (ref >59)
GFR, EST NON AFRICAN AMERICAN: 27 mL/min — AB (ref >60)
GLUCOSE: 93 mg/dL (ref 65–99)
Glucose: 102 mg/dL — ABNORMAL HIGH (ref 65–99)
POTASSIUM: 6.9 mmol/L — AB (ref 3.5–5.2)
Potassium: 5 mmol/L (ref 3.5–5.1)
SODIUM: 135 mmol/L (ref 134–144)
SODIUM: 136 mmol/L (ref 135–145)

## 2018-01-07 LAB — BRAIN NATRIURETIC PEPTIDE: B Natriuretic Peptide: 150.9 pg/mL — ABNORMAL HIGH (ref 0.0–100.0)

## 2018-01-07 LAB — I-STAT TROPONIN, ED: Troponin i, poc: 0.02 ng/mL (ref 0.00–0.08)

## 2018-01-07 MED ORDER — THIAMINE HCL 100 MG/ML IJ SOLN
100.0000 mg | Freq: Once | INTRAMUSCULAR | Status: AC
  Administered 2018-01-07: 100 mg via INTRAMUSCULAR
  Filled 2018-01-07: qty 2

## 2018-01-07 MED ORDER — SODIUM CHLORIDE 0.9 % IV BOLUS
500.0000 mL | Freq: Once | INTRAVENOUS | Status: AC
  Administered 2018-01-07: 500 mL via INTRAVENOUS

## 2018-01-07 MED ORDER — SODIUM CHLORIDE 0.9 % IV SOLN
INTRAVENOUS | Status: DC
  Administered 2018-01-07 – 2018-01-08 (×2): via INTRAVENOUS

## 2018-01-07 MED ORDER — ONDANSETRON HCL 4 MG/2ML IJ SOLN: 4.0000 mg | Freq: Four times a day (QID) | INTRAMUSCULAR | Status: DC | PRN

## 2018-01-07 MED ORDER — ONDANSETRON HCL 4 MG PO TABS: 4.0000 mg | ORAL_TABLET | Freq: Four times a day (QID) | ORAL | Status: DC | PRN

## 2018-01-07 MED ORDER — ACETAMINOPHEN 325 MG PO TABS
650.0000 mg | ORAL_TABLET | Freq: Four times a day (QID) | ORAL | Status: DC | PRN
  Filled 2018-01-07: qty 2

## 2018-01-07 MED ORDER — VITAMIN B-1 100 MG PO TABS
100.0000 mg | ORAL_TABLET | Freq: Every day | ORAL | Status: DC
  Administered 2018-01-08 – 2018-01-09 (×2): 100 mg via ORAL
  Filled 2018-01-07 (×2): qty 1

## 2018-01-07 MED ORDER — ENOXAPARIN SODIUM 40 MG/0.4ML ~~LOC~~ SOLN
40.0000 mg | SUBCUTANEOUS | Status: DC
  Administered 2018-01-08 – 2018-01-09 (×2): 40 mg via SUBCUTANEOUS
  Filled 2018-01-07 (×2): qty 0.4

## 2018-01-07 MED ORDER — ADULT MULTIVITAMIN W/MINERALS CH
1.0000 | ORAL_TABLET | Freq: Every day | ORAL | Status: DC
  Administered 2018-01-07 – 2018-01-09 (×3): 1 via ORAL
  Filled 2018-01-07 (×3): qty 1

## 2018-01-07 MED ORDER — ASPIRIN EC 81 MG PO TBEC: 81.0000 mg | DELAYED_RELEASE_TABLET | Freq: Every day | ORAL | Status: DC

## 2018-01-07 MED ORDER — ACETAMINOPHEN 650 MG RE SUPP: 650.0000 mg | Freq: Four times a day (QID) | RECTAL | Status: DC | PRN

## 2018-01-07 NOTE — Telephone Encounter (Signed)
Patient called back. Discussed with patient. He is going to head over to the ED.

## 2018-01-07 NOTE — ED Notes (Signed)
Turkey sandwich given 

## 2018-01-07 NOTE — Telephone Encounter (Signed)
[facility] called to report critical lab of K+ 6.9 on 01/06/18. Consulted with preceptor, Mingo Amber, instructions received to page PCP to call and find out if she would like patient to be seen here or sent to ED.  Text page sent at [phone]. Spoke with Dr Emmaline Life and she would like patient to go to ED. She will call patient. Preceptor notified. Danley Danker, RN [facility] RN)

## 2018-01-07 NOTE — Telephone Encounter (Signed)
Tried to call patient twice for elevated potassium of 6.9. However unable to get in touch with him. Left a message letting him know that he needs to go to ED for evaluation and to call back with questions. Will try again this afternoon.

## 2018-01-07 NOTE — H&P (Addendum)
[facility] Admission History and Physical Service Pager: [phone]  Patient name: [name] Medical record number: [id number] Date of birth: 1955/11/15 Age: 62 y.o. Gender: male  Primary Care Provider: Tonette Bihari, MD Consultants: None Code Status: Full  Chief Complaint: Dizziness  Assessment and Plan: [name] is a 62 y.o. male presenting with dizziness gradually worsening for one month. PMH is significant for CKD III, CHF, HTN  Dizziness with hypotension. Evaluated in clinic yesterday for symptoms of postural dizziness for the last 3 weeks and was found to have profound elevated K to 6.9. Undergoing medication adjustment with PCP (stopped lasix, halved imdur, cut dose of Coreg to 6.25mg ). History consistent with orthostatic hypotension given hypotension to 89/54 in ED with previous documented low normal BP in clinic 104/60, however orthostatics in ED negative. Could be due dehydration from to overdiuresis or overmedication as patient is on multiple antihypertensives. Could be precipitated by arrhythmia however in sinus rhythm at the time of admission with ongoing symptoms. Per chart review patient had similar complaints in October 2018 and was referred to cardiology to evaluate for need for holter but symptoms had resolved by cardiology appointment. Unlikely neurologic in nature as no loss of consciousness, or loss of bowel or bladder. Patient states last time he experienced similar symptoms, he was diagnosed with heart failure, so will obtain updated ECHO and BNP. Patient does have c/o chest tightness that is chronic and unchanged from his baseline but he does have cardiac disease, therefore will obtain troponin. CXR negative for edema or acute process. - admit to obs, attending Dr. Nori Riis - troponin x1 - ECHO - EKG now and in the morning - cardiac monitoring  - am BMP/CBC - TSH - BNP  AoCKD, stage III Cr 2.41 on admission, baseline ~1.3.  Likely prerenal in the setting of diuretic use. - IV fluids  - am BMP - hold diuretics - avoid nephrotoxic medications  Hyperkalemia K 6.9 in clinic on 01/06/18 despite holding Lasix for concern for orthostasis/hypotension. K on admission improved at 5.0. EKG done in the ED notable for peaked T waves. - repeat EKG now and in the am - IVF  HFrEF.  Patient does have history of EF 15% that has recovered with medical management. Last ECHO 06/2017 with EF 40-45% with diffuse hypokinesis and G1DD. At home on ASA, coreg, lasix, imdur, NTG prn, entresto, spironolactone. 2-3 pillow orthopnea at home with no recent increase. No fluid overload on exam. - obtain ECHO - BNP  Hypotension w/ h/o HTN BP 89/45 in ED. Currently undergoing medication adjustment with PCP. At last appointment stopped lasix, halved imdur, cute dose of Coreg to 6.25mg  given concern for orthostatic hypotension - hold home antihypertensives given hypotension  Alcohol Use Disorder Noted per chart review. On admission states he drinks 2-3 beers every other day. No previous hospitalizations for withdrawal, no seizures. - monitor on CIWA protocol  H/o gout Previously on colchicine, hasn't had to take in several months.  FEN/GI: Heart Healthy diet Prophylaxis: lovenox  Disposition: admit to observation, attending Dr. Nori Riis  History of Present Illness:  [name] is a 62 y.o. male presenting with lightheadedness and dizziness, gradually worsening for the past month. He saw his PCP yesterday for lightheadedness who did labs, was told to come to ED for critical labs (K 6.9). States his lightheadedness is worse on standing and acutely worsening over the last 1 month. States he has tunnel vision with these episodes and feels like he is  going to pass out but denies any loss of consciousness. Resting helps but states he is "always dizzy" at baseline over the past month. Also endorsing having worsening back pain over the last month that  he thinks is unrelated. Endorses mild headache, but denies fevers, loss of bowel or bladder, SOB, vomiting, weakness, palpitations. States he gets tired fast with minimal exertion. 2-3 pillow orthopnea at home. Also endorsing chest squeezing which is chronic for him, that is also worse with standing, mild currently. States he had similar symptoms 4-5 years ago when he was diagnosed with heart failure. At last visit 4/9, PCP stopped lasix, halved imdur, cute dose of Coreg to 6.25mg  but he did not receive new doses until this morning. Took aleve a few days ago but does not take regularly. Previously on colchicine but has not taken in several months. Labs were checked at last PCP visit with K of 6.9, so patient was told to come to the ED for evaluation.   Review Of Systems: Per HPI with the following additions:   Review of Systems  Constitutional: Positive for malaise/fatigue. Negative for fever and weight loss.  Respiratory: Positive for shortness of breath. Negative for hemoptysis and wheezing.   Cardiovascular: Positive for chest pain. Negative for palpitations and leg swelling.  Gastrointestinal: Negative for blood in stool, diarrhea, nausea and vomiting.  Musculoskeletal: Positive for back pain.  Neurological: Positive for dizziness. Negative for speech change, focal weakness, loss of consciousness and weakness.    Patient Active Problem List   Diagnosis Date Noted  . Postural dizziness with presyncope 01/06/2018  . Bilateral low back pain without sciatica 01/06/2018  . CKD (chronic kidney disease), stage III ([place]) 07/04/2017  . Colon polyp 03/12/2017  . History of colon polyps 03/04/2017  . Pain in the chest   . Left sided chest pain 04/09/2016  . Gout 03/22/2016  . Poor dentition 05/08/2014  . Syncope 05/29/2013  . Chest pain 04/16/2013  . Pulmonary nodule, right 11/03/2012  . Dyspnea 11/03/2012  . Substernal chest pain 10/14/2012  . Chronic eczema 10/06/2012  . NICM (nonischemic  cardiomyopathy) ([place]) 09/04/2012  . Chronic combined systolic and diastolic heart failure, NYHA class 3 ([place]) 06/02/2012  . Hypertension 05/21/2012  . Renal insufficiency 05/21/2012  . Alcohol abuse 05/21/2012  . Cardiac LV ejection fraction 10-20% 05/21/2012    Past Medical History: Past Medical History:  Diagnosis Date  . Alcohol abuse   . CHF (congestive heart failure) ([place])   . Chronic combined systolic and diastolic heart failure, NYHA class 3 ([place]) 06/02/2012  . Chronic systolic heart failure ([place]) 06/02/2012  . Hypertension   . NICM (nonischemic cardiomyopathy) ([place]) 09/04/2012  . Psoriasis    Possible psoriasis in the past    Past Surgical History: Past Surgical History:  Procedure Laterality Date  . LEFT AND RIGHT HEART CATHETERIZATION WITH CORONARY ANGIOGRAM N/A 05/25/2012   Procedure: LEFT AND RIGHT HEART CATHETERIZATION WITH CORONARY ANGIOGRAM;  Surgeon: Jolaine Artist, MD;  Location: [facility] CATH LAB;  Service: Cardiovascular;  Laterality: N/A;  . SKIN GRAFT     L arm got caught in machine in 1979    Social History: Social History   Tobacco Use  . Smoking status: Former Smoker    Years: 3.00    Types: Cigars    Last attempt to quit: 01/01/2012    Years since quitting: 6.0  . Smokeless tobacco: Never Used  . Tobacco comment: Rare cigar (black-n-milds)  Substance Use Topics  . Alcohol  use: Yes    Alcohol/week: 0.0 oz    Comment: 2-3 beers 3-4 days per week  . Drug use: No   Additional social history:  Lives at home alone. 4 daughters at live in [place]. Performs all ADLs without assistance.  Quit smoking in 2013, used to smoke black and milds. Remote use of marijuanas. 2-3 beers every other day. No history of alcohol withdrawal. Please also refer to relevant sections of EMR.  Family History: Family History  Problem Relation Age of Onset  . Heart failure Father   . Heart failure Other        Uncle. Possible MI?  Marland Kitchen Cancer Sister        Skin  . Cancer Brother   .  Colon polyps Brother   . Colon polyps Brother   . Colon cancer Neg Hx   . Esophageal cancer Neg Hx   . Rectal cancer Neg Hx   . Stomach cancer Neg Hx    Allergies and Medications: No Known Allergies Current Facility-Administered Medications on File Prior to Encounter  Medication Dose Route Frequency Provider Last Rate Last Dose  . 0.9 %  sodium chloride infusion  500 mL Intravenous Continuous Pyrtle, Lajuan Lines, MD       Current Outpatient Medications on File Prior to Encounter  Medication Sig Dispense Refill  . acetaminophen (TYLENOL 8 HOUR ARTHRITIS PAIN) 650 MG CR tablet Take 1 tablet (650 mg total) by mouth every 8 (eight) hours as needed for pain. 30 tablet 0  . aspirin 81 MG EC tablet TAKE 1 TABLET (81 MG TOTAL) BY MOUTH DAILY. 30 tablet 3  . carvedilol (COREG) 6.25 MG tablet TAKE 1 TABLET BY MOUTH 2 TIMES DAILY WITH A MEAL. (Patient taking differently: Take 6.25 mg by mouth 2 (two) times daily with a meal. TAKE 1 TABLET BY MOUTH 2 TIMES DAILY WITH A MEAL.) 30 tablet 0  . colchicine 0.6 MG tablet TAKE 3 TABLETS BY MOUTH NOW. IF SYMPTOMS PERSIST TAKE 1 TABLET EVERY 12 HOURS UNTIL SYMPTOMS RESOLVE. (Patient taking differently: TAKE 0.18 mg TABLETS BY MOUTH NOW. IF SYMPTOMS PERSIST TAKE 0.6 mg TABLET EVERY 12 HOURS as needed UNTIL SYMPTOMS RESOLVE.) 10 tablet 1  . furosemide (LASIX) 40 MG tablet Take 1 tablet (40 mg total) by mouth daily. 30 tablet 6  . isosorbide mononitrate (IMDUR) 30 MG 24 hr tablet Take 0.5 tablets (15 mg total) by mouth daily. (Patient taking differently: Take 30 mg by mouth daily. ) 30 tablet 6  . nitroGLYCERIN (NITROSTAT) 0.4 MG SL tablet PLACE 1 TABLET UNDER THE TONGUE EVERY 5 MINUTES AS NEEDED FOR CHEST PAIN. 10 tablet 0  . sacubitril-valsartan (ENTRESTO) 97-103 MG Take 1 tablet by mouth 2 (two) times daily. 60 tablet 6  . spironolactone (ALDACTONE) 25 MG tablet Take 1 tablet (25 mg total) by mouth daily. 90 tablet 3  . triamcinolone cream (KENALOG) 0.5 % APPLY TO THE  AFFECTED AREA(S) TOPICALLY EVERY DAY AS NEEDED. 30 g 1  . triamcinolone cream (KENALOG) 0.5 % APPLY TOPICALLY DAILY AS NEEDED. (Patient not taking: Reported on 01/07/2018) 30 g 1    Objective: BP 109/65   Pulse 60   Temp 97.8 F (36.6 C) (Oral)   Resp 12   Ht 6' (1.829 m)   Wt 200 lb (90.7 kg)   SpO2 100%   BMI 27.12 kg/m  Exam: General: pleasant male lying in bed, in NAD Eyes: PERRL, EOMI ENTM: MMM Neck: ROM intact, supple Cardiovascular: RRR, no mrg  Respiratory: CTAB, no wheezes, rales, rhonchi Gastrointestinal: soft, NTND, +BS MSK: ROM grossly intact, strength 5/5 to U/LE bilaterally, No edema.  Neuro: Alert and oriented, speech normal. Romberg negative. Optic field normal. PERRL, Extraocular movements intact.  Intact symmetric sensation to light touch of face and extremities bilaterally.  Hearing grossly intact bilaterally.  Tongue protrudes normally with no deviation.  Shoulder shrug, smile symmetric.  Derm: dry, peeling skin of lower shins bilaterally, some chronic skin discoloration on L arm Psych: mood and affect appropriate  Labs and Imaging: CBC BMET  Recent Labs  Lab 01/07/18 1338  WBC 5.0  HGB 12.3*  HCT 37.6*  PLT 190   Recent Labs  Lab 01/07/18 1338  NA 136  K 5.0  CL 104  CO2 21*  BUN 52*  CREATININE 2.41*  GLUCOSE 93  CALCIUM 8.7*      Troponin (Point of Care Test) Recent Labs    01/07/18 1347  TROPIPOC 0.02    Dg Chest 2 View  Result Date: 01/07/2018 CLINICAL DATA:  dizziness for about one month.  Elevated potassium. EXAM: CHEST - 2 VIEW COMPARISON:  Chest x-ray dated 04/09/2016. FINDINGS: Heart size and mediastinal contours are within normal limits. There is chronic elevation of the LEFT hemidiaphragm, with associated mild atelectasis at the LEFT lung base. Lungs otherwise clear. No pleural effusion or pneumothorax seen. No acute or suspicious osseous finding. Air-fluid level within the bowel underlying the LEFT hemidiaphragm, presumably in  the stomach. IMPRESSION: 1. No active cardiopulmonary disease. No evidence of pneumonia or pulmonary edema. 2. Chronic elevation of the LEFT hemidiaphragm. 3. Air-fluid level within the bowel underlying the elevated LEFT hemidiaphragm, presumably stomach. Electronically Signed   By: Franki Cabot M.D.   On: 01/07/2018 14:11   Rory Percy, DO 01/07/2018, 5:52 PM PGY-1, [facility] pager: [phone], text pages welcome  FPTS Upper-Level Resident Addendum  I have independently interviewed and examined the patient. I have discussed the above with the original author and agree with their documentation. My edits for correction/addition/clarification are in blue. Please see also any attending notes.   Bufford Lope, DO PGY-2, [facility] 01/07/2018 11:27 PM  FPTS Service pager: [phone] (text pages welcome through [place])

## 2018-01-07 NOTE — ED Triage Notes (Signed)
Pt states that he has been having dizziness for about one month and went to PCP to be evaluated. Pt reports PCP called him this morning to be evaluated r/t "potassium levels and needing some fluids".

## 2018-01-07 NOTE — ED Notes (Signed)
Rep;ort called to rn kristen on 5 w

## 2018-01-07 NOTE — Progress Notes (Signed)
Received report from [facility] in the ED.

## 2018-01-07 NOTE — Telephone Encounter (Signed)
Precepted this patient with Dr. Emmaline Life yesterday.  Agree that going to ED (in light of [place] holding lasix for concern for orthostasis/hypotension) is best course of action for stabilization and testing.

## 2018-01-07 NOTE — ED Provider Notes (Signed)
[facility] EMERGENCY DEPARTMENT Provider Note   CSN: [id number] Arrival date & time: 01/07/18  1254     History   Chief Complaint Chief Complaint  Patient presents with  . Dizziness  . Abnormal Lab    HPI [name] is a 62 y.o. male.  HPI   [name] is a 62 y.o. male, with a history of CHF, alcohol abuse, NICM, presenting to the ED with lightheadedness intermittently for the past month.  This sensation occurs when he sits or stands quickly. He can alleviate these occurrences by sitting or standing slowly. When he feels lightheaded and lays back down, the symptoms resolve.  States he drinks "about 2 beers every other day or so." Denies drug use.  Has not taken his nitroglycerin.  States his PCP decreased the dosage of his hypertensive medications yesterday. Labs were drawn yesterday, his PCPs office called him today, and told him he needed to come to the ED due to potassium of 6.9. Denies fever/chills, N/V/D, chest pain, shortness of breath, increased orthopnea, diaphoresis, LOC, falls, head injury, headache, vision changes, or any other complaints.      Past Medical History:  Diagnosis Date  . Alcohol abuse   . CHF (congestive heart failure) ([place])   . Chronic combined systolic and diastolic heart failure, NYHA class 3 ([place]) 06/02/2012  . Chronic systolic heart failure ([place]) 06/02/2012  . Hypertension   . NICM (nonischemic cardiomyopathy) ([place]) 09/04/2012  . Psoriasis    Possible psoriasis in the past    Patient Active Problem List   Diagnosis Date Noted  . Postural dizziness with presyncope 01/06/2018  . Bilateral low back pain without sciatica 01/06/2018  . CKD (chronic kidney disease), stage III ([place]) 07/04/2017  . Colon polyp 03/12/2017  . History of colon polyps 03/04/2017  . Pain in the chest   . Left sided chest pain 04/09/2016  . Gout 03/22/2016  . Poor dentition 05/08/2014  . Syncope 05/29/2013  . Chest pain 04/16/2013  .  Pulmonary nodule, right 11/03/2012  . Dyspnea 11/03/2012  . Substernal chest pain 10/14/2012  . Chronic eczema 10/06/2012  . NICM (nonischemic cardiomyopathy) ([place]) 09/04/2012  . Chronic combined systolic and diastolic heart failure, NYHA class 3 ([place]) 06/02/2012  . Hypertension 05/21/2012  . Renal insufficiency 05/21/2012  . Alcohol abuse 05/21/2012  . Cardiac LV ejection fraction 10-20% 05/21/2012    Past Surgical History:  Procedure Laterality Date  . LEFT AND RIGHT HEART CATHETERIZATION WITH CORONARY ANGIOGRAM N/A 05/25/2012   Procedure: LEFT AND RIGHT HEART CATHETERIZATION WITH CORONARY ANGIOGRAM;  Surgeon: Jolaine Artist, MD;  Location: [facility] CATH LAB;  Service: Cardiovascular;  Laterality: N/A;  . SKIN GRAFT     L arm got caught in machine in [place] Medications    Prior to Admission medications   Medication Sig Start Date End Date Taking? Authorizing Provider  acetaminophen (TYLENOL 8 HOUR ARTHRITIS PAIN) 650 MG CR tablet Take 1 tablet (650 mg total) by mouth every 8 (eight) hours as needed for pain. 01/06/18  Yes Mikell, Jeani Sow, MD  aspirin 81 MG EC tablet TAKE 1 TABLET (81 MG TOTAL) BY MOUTH DAILY. 12/16/16  Yes Mikell, Jeani Sow, MD  carvedilol (COREG) 6.25 MG tablet TAKE 1 TABLET BY MOUTH 2 TIMES DAILY WITH A MEAL. Patient taking differently: Take 6.25 mg by mouth 2 (two) times daily with a meal. TAKE 1 TABLET BY MOUTH 2 TIMES DAILY WITH A MEAL.  01/06/18  Yes Mikell, Jeani Sow, MD  colchicine 0.6 MG tablet TAKE 3 TABLETS BY MOUTH NOW. IF SYMPTOMS PERSIST TAKE 1 TABLET EVERY 12 HOURS UNTIL SYMPTOMS RESOLVE. Patient taking differently: TAKE 0.18 mg TABLETS BY MOUTH NOW. IF SYMPTOMS PERSIST TAKE 0.6 mg TABLET EVERY 12 HOURS as needed UNTIL SYMPTOMS RESOLVE. 07/07/17  Yes Mikell, Jeani Sow, MD  furosemide (LASIX) 40 MG tablet Take 1 tablet (40 mg total) by mouth daily. 10/24/17  Yes Shirley Friar, PA-C  isosorbide mononitrate (IMDUR) 30 MG 24 hr  tablet Take 0.5 tablets (15 mg total) by mouth daily. Patient taking differently: Take 30 mg by mouth daily.  01/06/18  Yes Mikell, Jeani Sow, MD  nitroGLYCERIN (NITROSTAT) 0.4 MG SL tablet PLACE 1 TABLET UNDER THE TONGUE EVERY 5 MINUTES AS NEEDED FOR CHEST PAIN. 04/26/16  Yes Mikell, Jeani Sow, MD  sacubitril-valsartan (ENTRESTO) 97-103 MG Take 1 tablet by mouth 2 (two) times daily. 10/24/17  Yes Shirley Friar, PA-C  spironolactone (ALDACTONE) 25 MG tablet Take 1 tablet (25 mg total) by mouth daily. 10/24/17  Yes Shirley Friar, PA-C  triamcinolone cream (KENALOG) 0.5 % APPLY TO THE AFFECTED AREA(S) TOPICALLY EVERY DAY AS NEEDED. 12/31/17  Yes Mikell, Jeani Sow, MD  triamcinolone cream (KENALOG) 0.5 % APPLY TOPICALLY DAILY AS NEEDED. Patient not taking: Reported on 01/07/2018 03/17/17   Tonette Bihari, MD    Family History Family History  Problem Relation Age of Onset  . Heart failure Father   . Heart failure Other        Uncle. Possible MI?  Marland Kitchen Cancer Sister        Skin  . Cancer Brother   . Colon polyps Brother   . Colon polyps Brother   . Colon cancer Neg Hx   . Esophageal cancer Neg Hx   . Rectal cancer Neg Hx   . Stomach cancer Neg Hx     Social History Social History   Tobacco Use  . Smoking status: Former Smoker    Years: 3.00    Types: Cigars    Last attempt to quit: 01/01/2012    Years since quitting: 6.0  . Smokeless tobacco: Never Used  . Tobacco comment: Rare cigar (black-n-milds)  Substance Use Topics  . Alcohol use: Yes    Alcohol/week: 0.0 oz    Comment: 2-3 beers 3-4 days per week  . Drug use: No     Allergies   Patient has no known allergies.   Review of Systems Review of Systems  Constitutional: Negative for chills, diaphoresis and fever.  Respiratory: Negative for shortness of breath.   Cardiovascular: Negative for chest pain and leg swelling.  Gastrointestinal: Negative for abdominal pain, diarrhea, nausea and vomiting.   Neurological: Positive for light-headedness. Negative for syncope, weakness, numbness and headaches.  All other systems reviewed and are negative.    Physical Exam Updated Vital Signs BP 117/73   Pulse (!) 55   Temp 97.8 F (36.6 C) (Oral)   Resp 18   Ht 6' (1.829 m)   Wt 90.7 kg (200 lb)   SpO2 96%   BMI 27.12 kg/m   Physical Exam  Constitutional: He appears well-developed and well-nourished. No distress.  HENT:  Head: Normocephalic and atraumatic.  Eyes: Conjunctivae are normal.  Neck: Neck supple.  Cardiovascular: Normal rate, regular rhythm, normal heart sounds and intact distal pulses.  Pulmonary/Chest: Effort normal and breath sounds normal. No respiratory distress.  Abdominal: Soft. There is no tenderness. There  is no guarding.  Musculoskeletal: He exhibits no edema.  Lymphadenopathy:    He has no cervical adenopathy.  Neurological: He is alert.  No sensory deficits. Strength 5/5 in all extremities. No gait disturbance. Coordination intact including heel to shin and finger to nose. Cranial nerves III-XII grossly intact. No facial droop.   Skin: Skin is warm and dry. He is not diaphoretic.  Psychiatric: He has a normal mood and affect. His behavior is normal.  Nursing note and vitals reviewed.    ED Treatments / Results  Labs (all labs ordered are listed, but only abnormal results are displayed) Labs Reviewed  BASIC METABOLIC PANEL - Abnormal; Notable for the following components:      Result Value   CO2 21 (*)    BUN 52 (*)    Creatinine, Ser 2.41 (*)    Calcium 8.7 (*)    GFR calc non Af Amer 27 (*)    GFR calc Af Amer 31 (*)    All other components within normal limits  CBC - Abnormal; Notable for the following components:   RBC 4.21 (*)    Hemoglobin 12.3 (*)    HCT 37.6 (*)    All other components within normal limits  I-STAT TROPONIN, ED   BUN  Date Value Ref Range Status  01/07/2018 52 (H) 6 - 20 mg/dL Final  01/06/2018 43 (H) 8 - 27 mg/dL  Final  10/07/2017 25 (H) 6 - 20 mg/dL Final  07/14/2017 24 (H) 6 - 20 mg/dL Final  12/16/2016 19 8 - 27 mg/dL Final  08/16/2016 18 6 - 20 mg/dL Final   Creat  Date Value Ref Range Status  03/22/2016 1.35 (H) 0.70 - 1.25 mg/dL Final    Comment:      For patients > or = 64 years of age: The upper reference limit for Creatinine is approximately 13% higher for people identified as African-American.     12/08/2015 1.16 0.70 - 1.25 mg/dL Final  10/06/2012 1.52 (H) 0.50 - 1.35 mg/dL Final   Creatinine, Ser  Date Value Ref Range Status  01/07/2018 2.41 (H) 0.61 - 1.24 mg/dL Final  01/06/2018 2.20 (H) 0.76 - 1.27 mg/dL Final  10/07/2017 1.39 (H) 0.61 - 1.24 mg/dL Final  07/14/2017 1.70 (H) 0.61 - 1.24 mg/dL Final     EKG EKG Interpretation  Date/Time:  Wednesday January 07 2018 13:26:46 EDT Ventricular Rate:  57 PR Interval:  214 QRS Duration: 88 QT Interval:  432 QTC Calculation: 420 R Axis:   41 Text Interpretation:  Sinus bradycardia with 1st degree A-V block Otherwise normal ECG since last tracing no significant change Confirmed by Daleen Bo [phone]) on 01/07/2018 4:12:12 PM   Radiology Dg Chest 2 View  Result Date: 01/07/2018 CLINICAL DATA:  dizziness for about one month.  Elevated potassium. EXAM: CHEST - 2 VIEW COMPARISON:  Chest x-ray dated 04/09/2016. FINDINGS: Heart size and mediastinal contours are within normal limits. There is chronic elevation of the LEFT hemidiaphragm, with associated mild atelectasis at the LEFT lung base. Lungs otherwise clear. No pleural effusion or pneumothorax seen. No acute or suspicious osseous finding. Air-fluid level within the bowel underlying the LEFT hemidiaphragm, presumably in the stomach. IMPRESSION: 1. No active cardiopulmonary disease. No evidence of pneumonia or pulmonary edema. 2. Chronic elevation of the LEFT hemidiaphragm. 3. Air-fluid level within the bowel underlying the elevated LEFT hemidiaphragm, presumably stomach.  Electronically Signed   By: Franki Cabot M.D.   On: 01/07/2018 14:11  Procedures Procedures (including critical care time)  Medications Ordered in ED Medications  sodium chloride 0.9 % bolus 500 mL (0 mLs Intravenous Stopped 01/07/18 1722)     Initial Impression / Assessment and Plan / ED Course  I have reviewed the triage vital signs and the nursing notes.  Pertinent labs & imaging results that were available during my care of the patient were reviewed by me and considered in my medical decision making (see chart for details).  Clinical Course as of Jan 08 1812  Wed Jan 07, 2018  1540 Increased from 1.39 on October 07, 2017.  Creatinine(!): 2.41 [SJ]  1555 Increased from 43 yesterday and 25 January 8.  BUN(!): 52 [SJ]  1750 Spoke with Shela Commons, family medicine resident. States they will come down and admit the patient.    [SJ]    Clinical Course User Index [SJ] Makinze Jani C, PA-C    Patient presents with intermittent lightheadedness upon standing for the past month.  Sounds orthostatic in nature.  No orthostatic changes noted today.  50% decrease in patient's GFR since January, indicating AKI.  Patient is on both Lasix and spironolactone, may be over diuresed. Admission for rehydration and continued management.  Findings and plan of care discussed with Daleen Bo, MD. Dr. Eulis Foster personally evaluated and examined this patient.  Vitals:   01/07/18 1301 01/07/18 1318 01/07/18 1500 01/07/18 1503  BP: (!) 89/54  117/73 117/73  Pulse: 61  (!) 56 (!) 55  Resp: 18   18  Temp: 97.8 F (36.6 C)     TempSrc: Oral     SpO2: 97%  99% 96%  Weight:  90.7 kg (200 lb)    Height:  6' (1.829 m)     Orthostatic VS for the past 24 hrs:  BP- Lying Pulse- Lying BP- Sitting Pulse- Sitting BP- Standing at 0 minutes Pulse- Standing at 0 minutes  01/07/18 1627 100/62 54 101/72 59 97/70 66     Final Clinical Impressions(s) / ED Diagnoses   Final diagnoses:  AKI (acute kidney  injury) [facility])    ED Discharge Orders    None       Lorayne Bender, PA-C 01/07/18 1813    Daleen Bo, MD 01/08/18 1507

## 2018-01-08 ENCOUNTER — Observation Stay (HOSPITAL_BASED_OUTPATIENT_CLINIC_OR_DEPARTMENT_OTHER): Payer: Medicare Other

## 2018-01-08 ENCOUNTER — Ambulatory Visit: Payer: Medicare Other | Admitting: Internal Medicine

## 2018-01-08 DIAGNOSIS — Z8249 Family history of ischemic heart disease and other diseases of the circulatory system: Secondary | ICD-10-CM | POA: Diagnosis not present

## 2018-01-08 DIAGNOSIS — M109 Gout, unspecified: Secondary | ICD-10-CM | POA: Diagnosis present

## 2018-01-08 DIAGNOSIS — I959 Hypotension, unspecified: Secondary | ICD-10-CM | POA: Diagnosis present

## 2018-01-08 DIAGNOSIS — Z79899 Other long term (current) drug therapy: Secondary | ICD-10-CM | POA: Diagnosis not present

## 2018-01-08 DIAGNOSIS — R42 Dizziness and giddiness: Secondary | ICD-10-CM

## 2018-01-08 DIAGNOSIS — R55 Syncope and collapse: Secondary | ICD-10-CM | POA: Diagnosis not present

## 2018-01-08 DIAGNOSIS — Z87891 Personal history of nicotine dependence: Secondary | ICD-10-CM | POA: Diagnosis not present

## 2018-01-08 DIAGNOSIS — N179 Acute kidney failure, unspecified: Secondary | ICD-10-CM | POA: Diagnosis not present

## 2018-01-08 DIAGNOSIS — Z809 Family history of malignant neoplasm, unspecified: Secondary | ICD-10-CM | POA: Diagnosis not present

## 2018-01-08 DIAGNOSIS — I509 Heart failure, unspecified: Secondary | ICD-10-CM

## 2018-01-08 DIAGNOSIS — Z7982 Long term (current) use of aspirin: Secondary | ICD-10-CM | POA: Diagnosis not present

## 2018-01-08 DIAGNOSIS — N183 Chronic kidney disease, stage 3 (moderate): Secondary | ICD-10-CM | POA: Diagnosis present

## 2018-01-08 DIAGNOSIS — E875 Hyperkalemia: Secondary | ICD-10-CM | POA: Diagnosis present

## 2018-01-08 DIAGNOSIS — E860 Dehydration: Secondary | ICD-10-CM | POA: Diagnosis present

## 2018-01-08 DIAGNOSIS — I428 Other cardiomyopathies: Secondary | ICD-10-CM | POA: Diagnosis present

## 2018-01-08 DIAGNOSIS — I130 Hypertensive heart and chronic kidney disease with heart failure and stage 1 through stage 4 chronic kidney disease, or unspecified chronic kidney disease: Secondary | ICD-10-CM | POA: Diagnosis present

## 2018-01-08 DIAGNOSIS — I5022 Chronic systolic (congestive) heart failure: Secondary | ICD-10-CM | POA: Diagnosis not present

## 2018-01-08 DIAGNOSIS — Z86010 Personal history of colonic polyps: Secondary | ICD-10-CM | POA: Diagnosis not present

## 2018-01-08 DIAGNOSIS — Z8371 Family history of colonic polyps: Secondary | ICD-10-CM | POA: Diagnosis not present

## 2018-01-08 DIAGNOSIS — T502X5A Adverse effect of carbonic-anhydrase inhibitors, benzothiadiazides and other diuretics, initial encounter: Secondary | ICD-10-CM | POA: Diagnosis present

## 2018-01-08 DIAGNOSIS — Y92009 Unspecified place in unspecified non-institutional (private) residence as the place of occurrence of the external cause: Secondary | ICD-10-CM | POA: Diagnosis not present

## 2018-01-08 DIAGNOSIS — I5042 Chronic combined systolic (congestive) and diastolic (congestive) heart failure: Secondary | ICD-10-CM | POA: Diagnosis not present

## 2018-01-08 DIAGNOSIS — I440 Atrioventricular block, first degree: Secondary | ICD-10-CM | POA: Diagnosis present

## 2018-01-08 DIAGNOSIS — F1010 Alcohol abuse, uncomplicated: Secondary | ICD-10-CM | POA: Diagnosis present

## 2018-01-08 LAB — ECHOCARDIOGRAM COMPLETE
Height: 72 in
Weight: 3208 oz

## 2018-01-08 LAB — BASIC METABOLIC PANEL
Anion gap: 10 (ref 5–15)
BUN: 42 mg/dL — AB (ref 6–20)
CHLORIDE: 107 mmol/L (ref 101–111)
CO2: 21 mmol/L — AB (ref 22–32)
Calcium: 8.5 mg/dL — ABNORMAL LOW (ref 8.9–10.3)
Creatinine, Ser: 1.97 mg/dL — ABNORMAL HIGH (ref 0.61–1.24)
GFR calc Af Amer: 40 mL/min — ABNORMAL LOW (ref >60)
GFR calc non Af Amer: 34 mL/min — ABNORMAL LOW (ref >60)
GLUCOSE: 125 mg/dL — AB (ref 65–99)
POTASSIUM: 4.3 mmol/L (ref 3.5–5.1)
Sodium: 138 mmol/L (ref 135–145)

## 2018-01-08 LAB — CBC
HEMATOCRIT: 36.5 % — AB (ref 39.0–52.0)
Hemoglobin: 12.1 g/dL — ABNORMAL LOW (ref 13.0–17.0)
MCH: 29.8 pg (ref 26.0–34.0)
MCHC: 33.2 g/dL (ref 30.0–36.0)
MCV: 89.9 fL (ref 78.0–100.0)
Platelets: 176 10*3/uL (ref 150–400)
RBC: 4.06 MIL/uL — ABNORMAL LOW (ref 4.22–5.81)
RDW: 13.3 % (ref 11.5–15.5)
WBC: 4.8 10*3/uL (ref 4.0–10.5)

## 2018-01-08 LAB — TROPONIN I: Troponin I: <0.03 ng/mL (ref <0.03)

## 2018-01-08 LAB — HIV ANTIBODY (ROUTINE TESTING W REFLEX): HIV SCREEN 4TH GENERATION: NONREACTIVE

## 2018-01-08 LAB — TSH: TSH: 2.29 u[IU]/mL (ref 0.350–4.500)

## 2018-01-08 NOTE — Assessment & Plan Note (Signed)
Likely musculoskeletal low back pain Provide patient with Tylenol arthritis Provide him with exercises to complete Follow-up in about 1 month if no improvement

## 2018-01-08 NOTE — Discharge Summary (Signed)
[facility] Discharge Summary  Patient name: [name] Medical record number: [id number] Date of birth: 1956/05/20 Age: 62 y.o. Gender: male Date of Admission: 01/07/2018  Date of Discharge: 01/09/2018 Admitting Physician: Tony La, MD  Primary Care Provider: Tonette Bihari, MD Consultants: Heart Failure  Indication for Hospitalization: hypotension, AKI  Discharge Diagnoses/Problem List:  Dizziness with hypotension AoCKD, stage III Hyperkalemia, resolved HFrEF H/o HTN Alcohol Use Disorder H/o gout  Disposition: Home  Discharge Condition: Improved  Discharge Exam:  General: pleasant male lying in bed, in NAD Cardiovascular: bradycardic, regular rhythm, no mrg Respiratory: CTAB, normal WOB on RA, no rales/rhonchi Abdomen: soft, NTND, +BS Extremities: warm and well perfused, no LE edema  Brief Hospital Course:  [name] is a 62 y.o. male with PMH significant for CKD III, CHF, HTN, gout, alcohol use presenting with dizziness gradually worsening for one month. In the ED, he was noted to be hypotensive to 89/45 with AKI and hyperkalemia to K 5.0.  CXR was negative for edema or acute process. EKG on admission NSR with negative troponin. Patient's antihypertensives were held and was started on IVF which improved his blood pressure, symptoms, and resolved his AKI. The Heart Failure team was consulted who adjusted antihypertensives as listed below. PT evaluated this admission and recommended outpatient PT.  Issues for Follow Up:  1. Medication Changes: 1. Stopped Lasix 2. Decreased Spironolactone to 12.5mg  daily 3. Decreased Carvedilol to 3.125mg  BID 4. Decreased Entresto to 24-26mg  BID 2. Patient will have outpatient PT. 3. Heart Failure team noted to consider outpatient workup for TTR amyloid.  Significant Procedures: None  Significant Labs and Imaging:  Recent Labs  Lab 01/07/18 1338 01/08/18 0138 01/09/18 0838  WBC 5.0 4.8 4.7   HGB 12.3* 12.1* 12.4*  HCT 37.6* 36.5* 38.1*  PLT 190 176 189   Recent Labs  Lab 01/06/18 1204 01/07/18 1338 01/08/18 0138 01/09/18 0838  NA 135 136 138 137  K 6.9* 5.0 4.3 4.5  CL 101 104 107 105  CO2 24 21* 21* 24  GLUCOSE 102* 93 125* 92  BUN 43* 52* 42* 17  CREATININE 2.20* 2.41* 1.97* 1.42*  CALCIUM 9.3 8.7* 8.5* 8.9    Dg Chest 2 View  Result Date: 01/07/2018 CLINICAL DATA:  dizziness for about one month.  Elevated potassium. EXAM: CHEST - 2 VIEW COMPARISON:  Chest x-ray dated 04/09/2016. FINDINGS: Heart size and mediastinal contours are within normal limits. There is chronic elevation of the LEFT hemidiaphragm, with associated mild atelectasis at the LEFT lung base. Lungs otherwise clear. No pleural effusion or pneumothorax seen. No acute or suspicious osseous finding. Air-fluid level within the bowel underlying the LEFT hemidiaphragm, presumably in the stomach. IMPRESSION: 1. No active cardiopulmonary disease. No evidence of pneumonia or pulmonary edema. 2. Chronic elevation of the LEFT hemidiaphragm. 3. Air-fluid level within the bowel underlying the elevated LEFT hemidiaphragm, presumably stomach. Electronically Signed   By: Franki Cabot M.D.   On: 01/07/2018 14:11   ECHO 01/08/2018 Study Conclusions - Left ventricle: The cavity size was normal. Wall thickness was   increased in a pattern of moderate LVH. The estimated ejection   fraction was 55%. Although no diagnostic regional wall motion   abnormality was identified, this possibility cannot be completely   excluded on the basis of this study. Doppler parameters are   consistent with abnormal left ventricular relaxation (grade 1   diastolic dysfunction). - Aortic valve: There was no stenosis. - Mitral valve: There  was no significant regurgitation. - Left atrium: The atrium was mildly dilated. - Right ventricle: The cavity size was normal. Systolic function   was mildly reduced. - Pulmonary arteries: No complete TR  doppler jet so unable to   estimate PA systolic pressure. - Systemic veins: IVC not visualized. Impressions: - Normal LV size with moderate LV hypertrophy. EF 55%. Normal RV   size with mildly decreased systolic function. No significant   valvular abnormalities.  Results/Tests Pending at Time of Discharge: None  Discharge Medications:  Allergies as of 01/09/2018   No Known Allergies     Medication List    STOP taking these medications   colchicine 0.6 MG tablet   furosemide 40 MG tablet Commonly known as:  LASIX   isosorbide mononitrate 30 MG 24 hr tablet Commonly known as:  IMDUR   sacubitril-valsartan 97-103 MG Commonly known as:  ENTRESTO Replaced by:  sacubitril-valsartan 24-26 MG     TAKE these medications   acetaminophen 650 MG CR tablet Commonly known as:  TYLENOL 8 HOUR ARTHRITIS PAIN Take 1 tablet (650 mg total) by mouth every 8 (eight) hours as needed for pain.   aspirin 81 MG EC tablet TAKE 1 TABLET (81 MG TOTAL) BY MOUTH DAILY.   carvedilol 3.125 MG tablet Commonly known as:  COREG Take 1 tablet (3.125 mg total) by mouth 2 (two) times daily with a meal. What changed:    medication strength  how much to take  how to take this  when to take this  additional instructions   nitroGLYCERIN 0.4 MG SL tablet Commonly known as:  NITROSTAT PLACE 1 TABLET UNDER THE TONGUE EVERY 5 MINUTES AS NEEDED FOR CHEST PAIN.   sacubitril-valsartan 24-26 MG Commonly known as:  ENTRESTO Take 1 tablet by mouth 2 (two) times daily. Replaces:  sacubitril-valsartan 97-103 MG   spironolactone 25 MG tablet Commonly known as:  ALDACTONE Take 0.5 tablets (12.5 mg total) by mouth at bedtime. What changed:    how much to take  when to take this   triamcinolone cream 0.5 % Commonly known as:  KENALOG APPLY TO THE AFFECTED AREA(S) TOPICALLY EVERY DAY AS NEEDED. What changed:  Another medication with the same name was removed. Continue taking this medication, and  follow the directions you see here.       Discharge Instructions: Please refer to Patient Instructions section of EMR for full details.  Patient was counseled important signs and symptoms that should prompt return to medical care, changes in medications, dietary instructions, activity restrictions, and follow up appointments.   Follow-Up Appointments: Follow-up Information    Clegg, Amy D, NP Follow up on 01/22/2018.   Specialty:  Cardiology Why:  Heart Failure Follow up @ 2:30-Parking at ER lot (blue awning to left of ER), or under [facility] on [address] (construction entrance, garage code [address], elevator 1st floor). Take all meds as scheduled, bring all pill bottles. Contact information: [address] [address] [phone]        Tony Bihari, MD Follow up on 01/15/2018.   Specialty:  Family Medicine Why:  @ 10:00am Contact information: [place] [address] [phone]        Bensimhon, Shaune Pascal, MD .   Specialty:  Cardiology Contact information: [place] [address] [phone]           Rory Percy, DO 01/09/2018, 2:47 PM PGY-1, [place]

## 2018-01-08 NOTE — Consult Note (Addendum)
Advanced Heart Failure Team Consult Note   Primary Physician: Tonette Bihari, MD PCP-Cardiologist:  No primary care provider on file.  Reason for Consultation: AKI/ Hypotension in CHF patient  HPI:    [name] is seen today for evaluation of AKI/Hypotension in CHF patient at the request of Dr. Jilda Roche is a 62 y.o. male  With h/o HTN, ETOH abuse, Biventrucular CHF down to EF 15% with recovered EF, and atypical CP.    Pt was last seen in HF clinic 10/07/2017. He complained of mild lightheadedness with rapid standing, and chronic 3 pillow orthopnea. No med changes made.   Pt seen in IM clinic 01/06/18 with hypotension. He c/o dizziness, especially from sitting to standing over about 3 weeks. It improves with lying down.  He also complained of fatigue and worsening SOB over the same 3 week time period. Orthostatics at that visit were negative at that visit though hypotensive into the 80s. Patients meds decreased and sent home with very close 2 day follow up. Pts labs drawn, and significant for AKI with Hyperkalemia at 6.9. Instructed to come to ED. Pertinent labs on admission include K 5.0, Creatinine 2.40 (from baseline ~ 1.4), BNP 150, negative troponin, WBC 5.0, and Hgb 12.3. TSH normal. HIV negative. CXR with no active cardiopulmonary disease. EKG showed sinus brady with 1st degree AV block. CHF team consulted with our history of following this patient.  Lasix, coreg, imdur, spiro, and entresto held.   Pt feeling better today - able to walk to bathroom without much problem . He states he has had significant lightheadedness over the past 3 weeks. He denies any recent illness, fevers, or chills. Appetite has been stable. He has been taking his medications as directed with lasix 40 daily and Entresto 97/103 bid. He denies marked weight loss. He did not try hold his lasix. He has constant, atypical CP most of the time. He describes this as a squeezing sensation. It is not  related to exertion, or relieved by rest.   Echo today EF 55%. Discussed dosing with PharmD personally.   Echo 06/2017 LVEF 40-45%, Grade 1 DD  R/LHC 04/2012 Normal Coronaries and preserved cardiac output.   RA = 7  RV = 56/3/10  PA = 52/23 (34)  PCW = 17 v= 25  Fick cardiac output/index = 7.8/3.9  Thermo CO/CI = 6.1/3.0  PVR = 2.1 [place]  SVR = 1194  FA sat = 97%  PA sat = 79%, 80%  High SVC = 79%  EF 15%   Review of Systems: [y] = yes, [ ]  = no   General: Weight gain [ ] ; Weight loss [ ] ; Anorexia [ ] ; Fatigue [y]; Fever [ ] ; Chills [ ] ; Weakness [ ]   Cardiac: Chest pain/pressure [ ] ; Resting SOB [ ] ; Exertional SOB [y]; Orthopnea [ ] ; Pedal Edema [ ] ; Palpitations [ ] ; Syncope [ ] ; Presyncope [y]; Paroxysmal nocturnal dyspnea[ ]   Pulmonary: Cough [ ] ; Wheezing[ ] ; Hemoptysis[ ] ; Sputum [ ] ; Snoring [ ]   GI: Vomiting[ ] ; Dysphagia[ ] ; Melena[ ] ; Hematochezia [ ] ; Heartburn[ ] ; Abdominal pain [ ] ; Constipation [ ] ; Diarrhea [ ] ; BRBPR [ ]   GU: Hematuria[ ] ; Dysuria [ ] ; Nocturia[ ]   Vascular: Pain in legs with walking [ ] ; Pain in feet with lying flat [ ] ; Non-healing sores [ ] ; Stroke [ ] ; TIA [ ] ; Slurred speech [ ] ;  Neuro: Headaches[ ] ; Vertigo[ ] ; Seizures[ ] ; Paresthesias[ ] ;Blurred vision [ ] ;  Diplopia [ ] ; Vision changes [ ]   Ortho/Skin: Arthritis [y]; Joint pain [y]; Muscle pain [ ] ; Joint swelling [ ] ; Back Pain [ ] ; Rash [ ]   Psych: Depression[ ] ; Anxiety[ ]   Heme: Bleeding problems [ ] ; Clotting disorders [ ] ; Anemia [ ]   Endocrine: Diabetes [ ] ; Thyroid dysfunction[ ]   Home Medications Prior to Admission medications   Medication Sig Start Date End Date Taking? Authorizing Provider  acetaminophen (TYLENOL 8 HOUR ARTHRITIS PAIN) 650 MG CR tablet Take 1 tablet (650 mg total) by mouth every 8 (eight) hours as needed for pain. 01/06/18  Yes Mikell, Jeani Sow, MD  aspirin 81 MG EC tablet TAKE 1 TABLET (81 MG TOTAL) BY MOUTH DAILY. 12/16/16  Yes Mikell, Jeani Sow, MD    carvedilol (COREG) 6.25 MG tablet TAKE 1 TABLET BY MOUTH 2 TIMES DAILY WITH A MEAL. Patient taking differently: Take 6.25 mg by mouth 2 (two) times daily with a meal. TAKE 1 TABLET BY MOUTH 2 TIMES DAILY WITH A MEAL. 01/06/18  Yes Mikell, Jeani Sow, MD  colchicine 0.6 MG tablet TAKE 3 TABLETS BY MOUTH NOW. IF SYMPTOMS PERSIST TAKE 1 TABLET EVERY 12 HOURS UNTIL SYMPTOMS RESOLVE. Patient taking differently: TAKE 0.18 mg TABLETS BY MOUTH NOW. IF SYMPTOMS PERSIST TAKE 0.6 mg TABLET EVERY 12 HOURS as needed UNTIL SYMPTOMS RESOLVE. 07/07/17  Yes Mikell, Jeani Sow, MD  furosemide (LASIX) 40 MG tablet Take 1 tablet (40 mg total) by mouth daily. 10/24/17  Yes Shirley Friar, PA-C  isosorbide mononitrate (IMDUR) 30 MG 24 hr tablet Take 0.5 tablets (15 mg total) by mouth daily. Patient taking differently: Take 30 mg by mouth daily.  01/06/18  Yes Mikell, Jeani Sow, MD  nitroGLYCERIN (NITROSTAT) 0.4 MG SL tablet PLACE 1 TABLET UNDER THE TONGUE EVERY 5 MINUTES AS NEEDED FOR CHEST PAIN. 04/26/16  Yes Mikell, Jeani Sow, MD  sacubitril-valsartan (ENTRESTO) 97-103 MG Take 1 tablet by mouth 2 (two) times daily. 10/24/17  Yes Shirley Friar, PA-C  spironolactone (ALDACTONE) 25 MG tablet Take 1 tablet (25 mg total) by mouth daily. 10/24/17  Yes Shirley Friar, PA-C  triamcinolone cream (KENALOG) 0.5 % APPLY TO THE AFFECTED AREA(S) TOPICALLY EVERY DAY AS NEEDED. 12/31/17  Yes Mikell, Jeani Sow, MD  triamcinolone cream (KENALOG) 0.5 % APPLY TOPICALLY DAILY AS NEEDED. Patient not taking: Reported on 01/07/2018 03/17/17   Tonette Bihari, MD    Past Medical History: Past Medical History:  Diagnosis Date  . Alcohol abuse   . CHF (congestive heart failure) ([place])   . Chronic combined systolic and diastolic heart failure, NYHA class 3 ([place]) 06/02/2012  . Chronic systolic heart failure ([place]) 06/02/2012  . Hypertension   . NICM (nonischemic cardiomyopathy) ([place]) 09/04/2012  . Psoriasis     Possible psoriasis in the past    Past Surgical History: Past Surgical History:  Procedure Laterality Date  . LEFT AND RIGHT HEART CATHETERIZATION WITH CORONARY ANGIOGRAM N/A 05/25/2012   Procedure: LEFT AND RIGHT HEART CATHETERIZATION WITH CORONARY ANGIOGRAM;  Surgeon: Jolaine Artist, MD;  Location: [facility] CATH LAB;  Service: Cardiovascular;  Laterality: N/A;  . SKIN GRAFT     L arm got caught in machine in 1979    Family History: Family History  Problem Relation Age of Onset  . Heart failure Father   . Heart failure Other        Uncle. Possible MI?  Marland Kitchen Cancer Sister        Skin  . Cancer  Brother   . Colon polyps Brother   . Colon polyps Brother   . Colon cancer Neg Hx   . Esophageal cancer Neg Hx   . Rectal cancer Neg Hx   . Stomach cancer Neg Hx     Social History: Social History   Socioeconomic History  . Marital status: Legally Separated    Spouse name: Not on file  . Number of children: Not on file  . Years of education: Not on file  . Highest education level: Not on file  Occupational History  . Not on file  Social Needs  . Financial resource strain: Not on file  . Food insecurity:    Worry: Not on file    Inability: Not on file  . Transportation needs:    Medical: Not on file    Non-medical: Not on file  Tobacco Use  . Smoking status: Former Smoker    Years: 3.00    Types: Cigars    Last attempt to quit: 01/01/2012    Years since quitting: 6.0  . Smokeless tobacco: Never Used  Substance and Sexual Activity  . Alcohol use: Yes    Alcohol/week: 0.0 oz    Comment: 2-3 beers 3-4 days per week  . Drug use: No  . Sexual activity: Yes  Lifestyle  . Physical activity:    Days per week: Not on file    Minutes per session: Not on file  . Stress: Not on file  Relationships  . Social connections:    Talks on phone: Not on file    Gets together: Not on file    Attends religious service: Not on file    Active member of club or organization: Not on file     Attends meetings of clubs or organizations: Not on file    Relationship status: Not on file  Other Topics Concern  . Not on file  Social History Narrative   Works as a Administrator.   Allergies:  No Known Allergies  Objective:    Vital Signs:   Temp:  [98 F (36.7 C)-99 F (37.2 C)] 98.4 F (36.9 C) (04/11 1403) Pulse Rate:  [53-74] 57 (04/11 1403) Resp:  [12-20] 20 (04/11 1403) BP: (89-136)/(58-85) 133/72 (04/11 1403) SpO2:  [91 %-100 %] 99 % (04/11 1403) Weight:  [200 lb 8 oz (90.9 kg)] 200 lb 8 oz (90.9 kg) (04/10 2300) Last BM Date: 01/06/18  Weight change: Filed Weights   01/07/18 1318 01/07/18 2300  Weight: 200 lb (90.7 kg) 200 lb 8 oz (90.9 kg)   Intake/Output:   Intake/Output Summary (Last 24 hours) at 01/08/2018 1500 Last data filed at 01/08/2018 1300 Gross per 24 hour  Intake 1549.17 ml  Output 800 ml  Net 749.17 ml    Physical Exam    General:  Lying flat in bed Well appearing. No resp difficulty HEENT: Normal Neck: Supple. JVP 7-8 cm. Carotids 2+ bilat; no bruits. No lymphadenopathy or thyromegaly appreciated. Cor: PMI nondisplaced. Regular rate & rhythm. No rubs, gallops or murmurs. Lungs: Clear no wheeze Abdomen: Soft, nontender, nondistended. No hepatosplenomegaly. No bruits or masses. Good bowel sounds. Extremities: No cyanosis, clubbing, or rash. No peripheral edema Diffuse vitiligo Neuro: Alert & orientedx3, cranial nerves grossly intact. moves all 4 extremities w/o difficulty. Flat affect  Telemetry   NSR/SR 50-60s, personally reviewed.  EKG    Sinus brady with 57 bpm, QRS 88 bpm, Personally reviewed.  Labs   Basic Metabolic Panel: Recent Labs  Lab 01/06/18  1204 01/07/18 1338 01/08/18 0138  NA 135 136 138  K 6.9* 5.0 4.3  CL 101 104 107  CO2 24 21* 21*  GLUCOSE 102* 93 125*  BUN 43* 52* 42*  CREATININE 2.20* 2.41* 1.97*  CALCIUM 9.3 8.7* 8.5*    Liver Function Tests: No results for input(s): AST, ALT, ALKPHOS, BILITOT, PROT,  ALBUMIN in the last 168 hours. No results for input(s): LIPASE, AMYLASE in the last 168 hours. No results for input(s): AMMONIA in the last 168 hours.  CBC: Recent Labs  Lab 01/06/18 1204 01/07/18 1338 01/08/18 0138  WBC 5.5 5.0 4.8  HGB 12.8* 12.3* 12.1*  HCT 39.8 37.6* 36.5*  MCV 90 89.3 89.9  PLT 200 190 176    Cardiac Enzymes: Recent Labs  Lab 01/07/18 2259  TROPONINI <0.03    BNP: BNP (last 3 results) Recent Labs    01/07/18 2259  BNP 150.9*    ProBNP (last 3 results) No results for input(s): PROBNP in the last 8760 hours.   CBG: No results for input(s): GLUCAP in the last 168 hours.  Coagulation Studies: No results for input(s): LABPROT, INR in the last 72 hours.   Imaging    No results found.  Medications:     Current Medications: . enoxaparin (LOVENOX) injection  40 mg Subcutaneous Q24H  . multivitamin with minerals  1 tablet Oral Daily  . thiamine  100 mg Oral Daily     Infusions: . sodium chloride 125 mL/hr at 01/08/18 0602     Patient Profile   [name] is a 62 y.o. male  With h/o HTN, ETOH abuse, Biventrucular CHF down to EF 15% with recovered EF, and atypical CP.    Admitted with hypotension and AKI.  Assessment/Plan   1. Hypotension - Meds on hold and AKI noted.  - ? Volume depletion vs low output HF. If EF preserved, may need to consider infiltrative cardiomyopathy. - Has received around 2 L of fluid. Will stop IVF.  2. Chronic Systolic Heart Failure.  H/O NICM - 01/2016 Echo EF 35-40%.  - Echo 06/2017 EF 40-45% (Closer to 50% per Dr. Haroldine Laws.)  - Repeat Echo pending.  - NYHA III symptoms, though mostly limited by fatigue.  - Volume status dry to stable.  - Stop IVF.  - With recover of EF, will likely be able to stop lasix.  - Will not resume spiro or Entresto today with AKI and hypotension - Consider resuming low dose imdur. BP has improved.  - Hold coreg for now.  - Will slowly add meds back tomorrow as  tolerated. May have to start with losartan in lieu of Entresto.  3. AKI on CKD III - Creatinine and BP improved with IVF 4. H/O ETOH - Drinking on occasion. Encouraged complete cessation.   Addendum:  EF 55% with Mod LVH, Grade 1 DD, and RV mildly reduced.   Medication concerns reviewed with patient and pharmacy team. Barriers identified: None at this time.   Length of Stay: 0  Shirley Friar, PA-C  01/08/2018, 3:00 PM  Advanced Heart Failure Team Pager [phone] (M-F; 7a - 4p)  Please contact [facility] for night-coverage after hours (4p -7a ) and weekends on [email or web address]  Patient seen and examined with the above-signed Advanced Practice Provider and/or Housestaff. I personally reviewed laboratory data, imaging studies and relevant notes. I independently examined the patient and formulated the important aspects of the plan. I have edited the note to reflect any of my changes or  salient points. I have personally discussed the plan with the patient and/or family.  62 y/o male with h/o systolic HF due to NICM. EF previously down to 15% but echos over the past year show steady improvement ad echo today with EF 55%. Admitted with volume depletion, orthostasis and AKI in setting of ongoing diuretic therapy.   Exam unremarkable except for severe vitiligo. JVP 6-7. Cor RRR. No edema. Warm extremities.  Suspect related to the fact that he as had LV improvement and no longer requires loop diuretics. Can stop IV hydration at this point. Stop lasix completely. Can decrease Entresto as needed.   CP is chronic Previous coronary angio showed normal cors. Troponin and ECG ok  Can consider outpatient w/u for TTR amyloid.   Glori Bickers, MD  9:24 PM

## 2018-01-08 NOTE — Progress Notes (Addendum)
Family Medicine Teaching Service Daily Progress Note Intern Pager: [phone]  Patient name: [name] Medical record number: [id number] Date of birth: 1956-04-04 Age: 62 y.o. Gender: male  Primary Care Provider: Tonette Bihari, MD Consultants: None Code Status: Full  Pt Overview and Major Events to Date:  4/10 - admitted for lightheadedness, hyperkalemia  Assessment and Plan: [name] is a 62 y.o. male presenting with dizziness gradually worsening for one month. PMH is significant for CKD III, CHF, HTN  Dizziness with hypotension (resolved).  BP normal, vitals stable overnight. S/p 535ml bolus and IV fluid infusion overnight with improvement in BP. Antihypertensives held overnight. Most likely due to dehydration in the setting of overdiuresis and poor water intake as symptoms have improved with IV hydration. BNP 150, lower from previous. Trop x1 negative. TSH wnl. EKG last night with peaked T waves, improved this morning.  - ECHO - cardiac monitoring   AoCKD, stage III Cr 2.41>1.97 on admission, baseline ~1.3. Likely prerenal in the setting of diuretic use and improved with IV hydration. - IV fluids  - hold diuretics - avoid nephrotoxic medications  Hyperkalemia, resolved K 6.9>5.0>4.3. EKG done in the ED notable for peaked T waves, improved this morning. Likely due to diuretic use as has now normalized with holding diuretics and IV hydration. - IVF  HFrEF.  Patient does have history of EF 15% that has recovered with medical management. Last ECHO 06/2017 with EF 40-45% with diffuse hypokinesis and G1DD. At home on ASA, coreg, lasix, imdur, NTG prn, entresto, spironolactone. 2-3 pillow orthopnea at home with no recent increase. No fluid overload on exam. BNP 150.9 which is lower than previous. - obtain ECHO - BNP  Hypotension (resolved) w/ h/o HTN BP 89/45 in ED, now normotensive. Currently undergoing medication adjustment with PCP. At last appointment stopped  lasix, halved imdur, cut dose of Coreg to 6.25mg  given concern for orthostatic hypotension - hold home antihypertensives given hypotension  Alcohol Use Disorder Noted per chart review. On admission states he drinks 2-3 beers every other day. No previous hospitalizations for withdrawal, no seizures. - monitor on CIWA protocol - 0  H/o gout Previously on colchicine, hasn't had to take in several months.  Disposition: continue inpatient management of lightheadedness  Subjective:  Feeling better this morning. States he went to bathroom with noted improvement in lightheadedness when walking. Patient states he drinks 1-2 glasses of water per day with some soda and juice.   Objective: Temp:  [97.8 F (36.6 C)-99 F (37.2 C)] 98 F (36.7 C) (04/11 0500) Pulse Rate:  [53-74] 60 (04/11 0500) Resp:  [12-18] 18 (04/11 0500) BP: (89-136)/(54-85) 136/81 (04/11 0500) SpO2:  [91 %-100 %] 100 % (04/11 0500) Weight:  [200 lb (90.7 kg)-200 lb 8 oz (90.9 kg)] 200 lb 8 oz (90.9 kg) (04/10 2300)  Physical Exam: General: pleasant male lying in bed, in NAD Cardiovascular: RRR, no mrg Respiratory: CTAB, normal WOB on RA, no rales/rhonchi Abdomen: soft, NTND, +BS Extremities: warm and well perfused, no LE edema  Laboratory: Recent Labs  Lab 01/06/18 1204 01/07/18 1338 01/08/18 0138  WBC 5.5 5.0 4.8  HGB 12.8* 12.3* 12.1*  HCT 39.8 37.6* 36.5*  PLT 200 190 176   Recent Labs  Lab 01/06/18 1204 01/07/18 1338 01/08/18 0138  NA 135 136 138  K 6.9* 5.0 4.3  CL 101 104 107  CO2 24 21* 21*  BUN 43* 52* 42*  CREATININE 2.20* 2.41* 1.97*  CALCIUM 9.3 8.7* 8.5*  GLUCOSE 102*  93 125*    Imaging/Diagnostic Tests: Dg Chest 2 View  Result Date: 01/07/2018 CLINICAL DATA:  dizziness for about one month.  Elevated potassium. EXAM: CHEST - 2 VIEW COMPARISON:  Chest x-ray dated 04/09/2016. FINDINGS: Heart size and mediastinal contours are within normal limits. There is chronic elevation of the LEFT  hemidiaphragm, with associated mild atelectasis at the LEFT lung base. Lungs otherwise clear. No pleural effusion or pneumothorax seen. No acute or suspicious osseous finding. Air-fluid level within the bowel underlying the LEFT hemidiaphragm, presumably in the stomach. IMPRESSION: 1. No active cardiopulmonary disease. No evidence of pneumonia or pulmonary edema. 2. Chronic elevation of the LEFT hemidiaphragm. 3. Air-fluid level within the bowel underlying the elevated LEFT hemidiaphragm, presumably stomach. Electronically Signed   By: Franki Cabot M.D.   On: 01/07/2018 14:11   Rory Percy, DO 01/08/2018, 6:58 AM PGY-1, [place] Intern pager: [phone], text pages welcome

## 2018-01-08 NOTE — Progress Notes (Signed)
  Echocardiogram 2D Echocardiogram has been performed.  Jennette Dubin 01/08/2018, 11:28 AM

## 2018-01-08 NOTE — Assessment & Plan Note (Addendum)
Obtain orthostatics (not orthostatic) however blood pressure 80s /60s -lying down and sitting up (with hx of 130s/80s historical) most concerning for significant hypotension likely causing presyncope -given exam which is notable for patient being euvolemic - most concerning for dehydration-is on several medications that can cause hypotension and dehydration. Will work to decrease these and look for improvements with close follow up  -Patient has no signs of fluid overload on exam-no crackles noted, no JVD, no lower extremity edema  -Denies any bleeding making hypotension unlikely due to acute blood loss -EKG which was within normal limits -no signs of arrhythmia noted - Obtain CBC and BMET -will decrease Imdur to 15 mg, decrease Coreg to 3.25 mg, stop Lasix, and have patient follow-up in 2 days - Discussed with Dr. Mingo Amber

## 2018-01-08 NOTE — Progress Notes (Signed)
Patient transferred from ED to 5W03. Patient A&Ox4 with no complaints of pain. Telemetry box 29 applied and second verified. Skin assessment completed by 2 RNs. Patient instructed to use the callbell before getting out of bed to use the bathroom. Call bell within reach. Will continue to monitor and treat per MD orders.

## 2018-01-09 DIAGNOSIS — N179 Acute kidney failure, unspecified: Principal | ICD-10-CM

## 2018-01-09 DIAGNOSIS — I5042 Chronic combined systolic (congestive) and diastolic (congestive) heart failure: Secondary | ICD-10-CM

## 2018-01-09 DIAGNOSIS — I5022 Chronic systolic (congestive) heart failure: Secondary | ICD-10-CM

## 2018-01-09 LAB — BASIC METABOLIC PANEL
Anion gap: 8 (ref 5–15)
BUN: 17 mg/dL (ref 6–20)
CHLORIDE: 105 mmol/L (ref 101–111)
CO2: 24 mmol/L (ref 22–32)
Calcium: 8.9 mg/dL (ref 8.9–10.3)
Creatinine, Ser: 1.42 mg/dL — ABNORMAL HIGH (ref 0.61–1.24)
GFR calc Af Amer: 59 mL/min — ABNORMAL LOW (ref >60)
GFR calc non Af Amer: 51 mL/min — ABNORMAL LOW (ref >60)
GLUCOSE: 92 mg/dL (ref 65–99)
POTASSIUM: 4.5 mmol/L (ref 3.5–5.1)
Sodium: 137 mmol/L (ref 135–145)

## 2018-01-09 LAB — CBC
HEMATOCRIT: 38.1 % — AB (ref 39.0–52.0)
Hemoglobin: 12.4 g/dL — ABNORMAL LOW (ref 13.0–17.0)
MCH: 29.5 pg (ref 26.0–34.0)
MCHC: 32.5 g/dL (ref 30.0–36.0)
MCV: 90.5 fL (ref 78.0–100.0)
Platelets: 189 10*3/uL (ref 150–400)
RBC: 4.21 MIL/uL — AB (ref 4.22–5.81)
RDW: 13.3 % (ref 11.5–15.5)
WBC: 4.7 10*3/uL (ref 4.0–10.5)

## 2018-01-09 MED ORDER — LOSARTAN POTASSIUM 50 MG PO TABS: 25.0000 mg | ORAL_TABLET | Freq: Every day | ORAL | Status: DC

## 2018-01-09 MED ORDER — SPIRONOLACTONE 25 MG PO TABS: 12.5000 mg | ORAL_TABLET | Freq: Every day | ORAL | 0 refills | Status: DC

## 2018-01-09 MED ORDER — CARVEDILOL 3.125 MG PO TABS: 3.1250 mg | ORAL_TABLET | Freq: Two times a day (BID) | ORAL | Status: DC

## 2018-01-09 MED ORDER — LOSARTAN POTASSIUM 25 MG PO TABS: 12.5000 mg | ORAL_TABLET | Freq: Every day | ORAL | Status: DC

## 2018-01-09 MED ORDER — SPIRONOLACTONE 12.5 MG HALF TABLET: 12.5000 mg | ORAL_TABLET | Freq: Every day | ORAL | Status: DC

## 2018-01-09 MED ORDER — SACUBITRIL-VALSARTAN 24-26 MG PO TABS
1.0000 | ORAL_TABLET | Freq: Two times a day (BID) | ORAL | Status: DC
  Filled 2018-01-09: qty 1

## 2018-01-09 MED ORDER — CARVEDILOL 3.125 MG PO TABS: 3.1250 mg | ORAL_TABLET | Freq: Two times a day (BID) | ORAL | 0 refills | Status: DC

## 2018-01-09 MED ORDER — SACUBITRIL-VALSARTAN 24-26 MG PO TABS: 1.0000 | ORAL_TABLET | Freq: Two times a day (BID) | ORAL | 0 refills | Status: DC

## 2018-01-09 MED FILL — CARVEDILOL 3.125 MG TABLET: 3.125 | 30 days supply | Qty: 60 | Fill #0

## 2018-01-09 MED FILL — ENTRESTO 24 MG-26 MG TABLET: 24-26 | 30 days supply | Qty: 60 | Fill #0

## 2018-01-09 NOTE — Progress Notes (Signed)
Pt given discharge instructions, prescriptions, and care notes. Pt verbalized understanding AEB no further questions or concerns at this time. IV was discontinued, no redness, pain, or swelling noted at this time. Telemetry discontinued and Centralized Telemetry was notified. Pt walked off floor with staff in stable condition.

## 2018-01-09 NOTE — Progress Notes (Addendum)
Advanced Heart Failure Rounding Note  PCP-Cardiologist: Glori Bickers, MD   Subjective:    Home meds held yesterday with hypotension and AKI. Creatinine back to baseline 1.42 this am. SBP 130s  Feels better today. No CP, SOB. Still has mild dizziness. Wants to go home today.   Objective:   Weight Range: 200 lb 8 oz (90.9 kg) Body mass index is 27.19 kg/m.   Vital Signs:   Temp:  [98 F (36.7 C)-98.4 F (36.9 C)] 98 F (36.7 C) (04/12 0521) Pulse Rate:  [54-62] 54 (04/12 0521) Resp:  [16-20] 16 (04/12 0521) BP: (133)/(72-89) 133/89 (04/12 0521) SpO2:  [96 %-99 %] 97 % (04/12 0521) Last BM Date: 01/06/18  Weight change: Filed Weights   01/07/18 1318 01/07/18 2300  Weight: 200 lb (90.7 kg) 200 lb 8 oz (90.9 kg)    Intake/Output:   Intake/Output Summary (Last 24 hours) at 01/09/2018 1048 Last data filed at 01/08/2018 1300 Gross per 24 hour  Intake 120 ml  Output -  Net 120 ml      Physical Exam    General:  Well appearing. No resp difficulty. Sitting in chair HEENT: Normal Neck: Supple. JVP flat. Carotids 2+ bilat; no bruits. No lymphadenopathy or thyromegaly appreciated. Cor: PMI nondisplaced. Regular rate & rhythm. No rubs, gallops or murmurs. Lungs: Clear Abdomen: Soft, nontender, nondistended. No hepatosplenomegaly. No bruits or masses. Good bowel sounds. Extremities: No cyanosis, clubbing, rash, edema. Diffuse vitiligo  Neuro: Alert & orientedx3, cranial nerves grossly intact. moves all 4 extremities w/o difficulty. Affect pleasant   Telemetry   SR 1st degree AV block 50s. Personally reviewed.   EKG    No new tracings.   Labs    CBC Recent Labs    01/08/18 0138 01/09/18 0838  WBC 4.8 4.7  HGB 12.1* 12.4*  HCT 36.5* 38.1*  MCV 89.9 90.5  PLT 176 262   Basic Metabolic Panel Recent Labs    01/08/18 0138 01/09/18 0838  NA 138 137  K 4.3 4.5  CL 107 105  CO2 21* 24  GLUCOSE 125* 92  BUN 42* 17  CREATININE 1.97* 1.42*  CALCIUM  8.5* 8.9   Liver Function Tests No results for input(s): AST, ALT, ALKPHOS, BILITOT, PROT, ALBUMIN in the last 72 hours. No results for input(s): LIPASE, AMYLASE in the last 72 hours. Cardiac Enzymes Recent Labs    01/07/18 2259  TROPONINI <0.03    BNP: BNP (last 3 results) Recent Labs    01/07/18 2259  BNP 150.9*    ProBNP (last 3 results) No results for input(s): PROBNP in the last 8760 hours.   D-Dimer No results for input(s): DDIMER in the last 72 hours. Hemoglobin A1C No results for input(s): HGBA1C in the last 72 hours. Fasting Lipid Panel No results for input(s): CHOL, HDL, LDLCALC, TRIG, CHOLHDL, LDLDIRECT in the last 72 hours. Thyroid Function Tests Recent Labs    01/07/18 2259  TSH 2.290    Other results:   Imaging    No results found.   Medications:     Scheduled Medications: . enoxaparin (LOVENOX) injection  40 mg Subcutaneous Q24H  . multivitamin with minerals  1 tablet Oral Daily  . thiamine  100 mg Oral Daily    Infusions:   PRN Medications: acetaminophen **OR** acetaminophen, ondansetron **OR** ondansetron (ZOFRAN) IV    Patient Profile   [name] is a 62 y.o. male  With h/o HTN, ETOH abuse, Biventrucular CHF down to EF 15% with  recovered EF, and atypical CP.    Admitted with hypotension and AKI.  Assessment/Plan   1. Hypotension - He received ~2L IVF. Meds held yesterday with hypotension and AKI. Both resolved this am. Still has mild dizziness.  - ? Volume depletion vs low output HF. If EF preserved, may need to consider infiltrative cardiomyopathy. 2. Chronic Systolic Heart Failure. H/O NICM - 01/2016 Echo EF 35-40%.  -Echo 06/2017 EF 40-45% (Closer to 50% perDr. Talyia Allende.) - Echo 01/08/18: EF 55% with Mod LVH, Grade 1 DD, and RV mildly reduced.  - NYHA III symptoms, though mostly limited by fatigue.  - Volume status stable to dry.  - With recover of EF, can likely stop lasix completely.  - Creatinine back to  baseline. SBP 130s.  - Add back lose dose coreg 3.125 mg BID, spiro 12.5 mg daily, and entresto 24/26 mg BID.  - Consider outpatient workup for TTR.  3. AKI on CKD III - Resolved with IVF.  - Creatinine at baseline this am. 1.42 4. H/O ETOH - Drinking on occasion. Encouraged complete cessation. No change.  5. Chest pain - Atypical - Normal cors on cath 04/2012. Troponin and EKG negative.    Medication concerns reviewed with patient and pharmacy team. Barriers identified: None.   Length of Stay: [place], NP  01/09/2018, 10:48 AM  Advanced Heart Failure Team Pager [phone] (M-F; [place])  Please contact [facility] for night-coverage after hours (4p -7a ) and weekends on [email or web address]  Patient seen and examined with the above-signed Advanced Practice Provider and/or Housestaff. I personally reviewed laboratory data, imaging studies and relevant notes. I independently examined the patient and formulated the important aspects of the plan. I have edited the note to reflect any of my changes or salient points. I have personally discussed the plan with the patient and/or family.  He looks good today. Creatinine back to baseline. BP back up. Ok to go home today from our perspective.   D/c HF meds (most are decreased): Entresto 24/26 bid Spiro 12.5 Carvedilol 3.125 bid Stop lasix  Glori Bickers, MD  1:24 PM

## 2018-01-09 NOTE — Evaluation (Signed)
Physical Therapy Evaluation Patient Details Name: [name] MRN: [id number] DOB: 06/29/1956 Today's Date: 01/09/2018   History of Present Illness  62yo male presenting with worsening dizziness of 1 month, found to be hypotensive but negative for orthostatic BP in the ED. He was referred to the ED by his PCP, who found his K+ to be 6.9. PMH CHF, heart failure, HTN, cardiac cath, NICM   Clinical Impression   Patient received sitting up in chair, pleasant and willing to work with skilled PT services. He is able to perform all functional transfers, gait, and stair navigation with Mod(I) to S and with no device; note general antalgic pattern due to chronic back and L knee pain however he reports this is his general baseline level of function. He reports no functional concerns that PT will be able to address in the acute setting, however he will likely benefit from skilled PT services in the outpatient setting to address chronic back and L knee pain moving forward.     Follow Up Recommendations Outpatient PT    Equipment Recommendations  None recommended by PT    Recommendations for Other Services       Precautions / Restrictions Precautions Precautions: None Restrictions Weight Bearing Restrictions: No      Mobility  Bed Mobility               General bed mobility comments: DNT, received up in chair   Transfers Overall transfer level: Modified independent Equipment used: None             General transfer comment: Mod(I), no safety or balance concerns noted today   Ambulation/Gait Ambulation/Gait assistance: Modified independent (Device/Increase time) Ambulation Distance (Feet): 300 Feet Assistive device: None Gait Pattern/deviations: WFL(Within Functional Limits);Step-through pattern;Antalgic     General Gait Details: mildly antalgic pattern due to chronic back and L knee pain, patient reports he is generally at his baseline   Stairs Stairs: Yes Stairs  assistance: Supervision Stair Management: One rail Right;Step to pattern;Alternating pattern Number of Stairs: 14 General stair comments: alternates between step to and step over step pattern, antalgic but patient again reports this is his baseline level of stair navigation   Wheelchair Mobility    Modified Rankin (Stroke Patients Only)       Balance Overall balance assessment: Independent                                           Pertinent Vitals/Pain Pain Assessment: No/denies pain    Home Living Family/patient expects to be discharged to:: Private residence Living Arrangements: Alone   Type of Home: Mobile home Home Access: Stairs to enter Entrance Stairs-Rails: Right Entrance Stairs-Number of Steps: 6-7 in front, 2 in back  Home Layout: One level Home Equipment: None      Prior Function Level of Independence: Independent               Hand Dominance        Extremity/Trunk Assessment   Upper Extremity Assessment Upper Extremity Assessment: Overall WFL for tasks assessed    Lower Extremity Assessment Lower Extremity Assessment: Overall WFL for tasks assessed    Cervical / Trunk Assessment Cervical / Trunk Assessment: Normal  Communication   Communication: No difficulties  Cognition Arousal/Alertness: Awake/alert Behavior During Therapy: WFL for tasks assessed/performed Overall Cognitive Status: Within Functional Limits for tasks assessed  General Comments      Exercises     Assessment/Plan    PT Assessment All further PT needs can be met in the next venue of care  PT Problem List Decreased strength;Pain       PT Treatment Interventions      PT Goals (Current goals can be found in the Care Plan section)  Acute Rehab PT Goals Patient Stated Goal: to go home today  PT Goal Formulation: With patient Time For Goal Achievement: 01/23/18 Potential to Achieve Goals:  Good    Frequency     Barriers to discharge        Co-evaluation               AM-PAC PT "6 Clicks" Daily Activity  Outcome Measure Difficulty turning over in bed (including adjusting bedclothes, sheets and blankets)?: None Difficulty moving from lying on back to sitting on the side of the bed? : None Difficulty sitting down on and standing up from a chair with arms (e.g., wheelchair, bedside commode, etc,.)?: None Help needed moving to and from a bed to chair (including a wheelchair)?: None Help needed walking in hospital room?: None Help needed climbing 3-5 steps with a railing? : A Little 6 Click Score: 23    End of Session   Activity Tolerance: Patient tolerated treatment well Patient left: in chair;with call bell/phone within reach   PT Visit Diagnosis: Pain;Difficulty in walking, not elsewhere classified (R26.2) Pain - Right/Left: (back pain ) Pain - part of body: (back pain )    Time: 4660-5637 PT Time Calculation (min) (ACUTE ONLY): 15 min   Charges:   PT Evaluation $PT Eval Low Complexity: 1 Low     PT G Codes:       Deniece Ree PT, DPT, CBIS  Supplemental Physical Therapist [place]   Pager [phone]

## 2018-01-09 NOTE — Progress Notes (Signed)
Family Medicine Teaching Service Daily Progress Note Intern Pager: [phone]  Patient name: [name] Medical record number: [id number] Date of birth: 08-26-1956 Age: 62 y.o. Gender: male  Primary Care Provider: Tonette Bihari, MD Consultants: None Code Status: Full  Pt Overview and Major Events to Date:  4/10 - admitted for lightheadedness, hyperkalemia  Assessment and Plan: Charle Clear is a 62 y.o. male presenting with dizziness gradually worsening for one month. PMH is significant for CKD III, CHF, HTN  Dizziness with hypotension (resolved)  HFrEF  H/o Hypertension Lightheadedness continued to be improved from admission. Walking to bathroom without complications. BP stable overnight with HR 50s, currently holding home meds. ECHO obtained yesterday with EF 55%, normal LV size with moderate LV hypertrophy, mildly decreased RV function, G1DD. Heart Failure team evaluated yesterday who feels symptoms were likely due to increased LV function and may not require diuretics. HF stopped IVF and recommended considering resuming low dose imdur with slowly adding back meds today but not resuming spironolactone or entresto (considering losartan instead or adding lower dose of Entresto), also can consider outpatient work up for TTR amyloid. Not fluid overloaded on exam. - cardiac monitoring  - Heart Failure team following, appreciate recs - can consider adding back home meds today, appreciate HF guidance  AoCKD, stage III Cr 2.41>1.97>1.42, baseline ~1.3. Likely prerenal in the setting of diuretic use and improved with IV hydration. - IV fluids  - avoid nephrotoxic medications  Hyperkalemia, resolved K 6.9>5.0>>4.5. EKG done in the ED notable for peaked T waves, improved this morning. Likely due to diuretic use as has now normalized with holding diuretics and IV hydration. - IVF  Alcohol Use Disorder Noted per chart review. On admission states he drinks 2-3 beers every other day.  No previous hospitalizations for withdrawal, no seizures. - monitor on CIWA protocol - 0  H/o gout Previously on colchicine, hasn't had to take in several months.  Disposition: pending medication adjustment and toleration  Subjective:  Feeling well this morning. States lightheadedness has improved from admission. Endorsing chronic chest pressure.  Objective: Temp:  [98 F (36.7 C)-98.4 F (36.9 C)] 98 F (36.7 C) (04/12 0521) Pulse Rate:  [54-62] 54 (04/12 0521) Resp:  [16-20] 16 (04/12 0521) BP: (133)/(72-89) 133/89 (04/12 0521) SpO2:  [96 %-99 %] 97 % (04/12 0521)  Physical Exam: General: pleasant male lying in bed, in NAD Cardiovascular: bradycardic, regular rhythm, no mrg Respiratory: CTAB, normal WOB on RA, no rales/rhonchi Abdomen: soft, NTND, +BS Extremities: warm and well perfused, no LE edema  Laboratory: Recent Labs  Lab 01/07/18 1338 01/08/18 0138 01/09/18 0838  WBC 5.0 4.8 4.7  HGB 12.3* 12.1* 12.4*  HCT 37.6* 36.5* 38.1*  PLT 190 176 189   Recent Labs  Lab 01/07/18 1338 01/08/18 0138 01/09/18 0838  NA 136 138 137  K 5.0 4.3 4.5  CL 104 107 105  CO2 21* 21* 24  BUN 52* 42* 17  CREATININE 2.41* 1.97* 1.42*  CALCIUM 8.7* 8.5* 8.9  GLUCOSE 93 125* 92    Imaging/Diagnostic Tests: No results found. Rory Percy, DO 01/09/2018, 12:04 PM PGY-1, [place] Intern pager: [phone], text pages welcome

## 2018-01-09 NOTE — Care Management Note (Signed)
Case Management Note  Patient Details  Name: [name] MRN: [id number] Date of Birth: 25-May-1956  Subjective/Objective:      Postural dizziness with pre syncope.             [employer] Pandora Leiter) Desiree Fleming (Brother670 [phone]     PCP: [name]  Action/Plan: Transition to home today. Pt declined outpatient PT, recommendations per PT.  Patient states has transportation to home.  Expected Discharge Date:  01/09/18               Expected Discharge Plan:  Home/Self Care  In-House Referral:     Discharge planning Services  CM Consult  Post Acute Care Choice:   N/A Choice offered to:   N/A  DME Arranged:   N/A DME Agency:   N/A  HH Arranged:    N/A HH Agency:   N/A  Status of Service:  Completed, signed off  If discussed at [place] of Stay Meetings, dates discussed:    Additional Comments:  Sharin Mons, RN 01/09/2018, 2:49 PM

## 2018-01-09 NOTE — Discharge Instructions (Signed)
You were admitted for lightheadedness, kidney injury and low blood pressure in the setting of over diuresis. You were given IV fluids which helped improve your blood pressure, kidney function, and symptoms. We obtained an updated ultrasound of your heart which showed improved function from previous. The Heart Failure team saw you this admission who adjusted some of your medications for heart failure which will hopefully continue to help your symptoms. Follow up with your primary doctor on 4/18 at 10am.

## 2018-01-13 ENCOUNTER — Other Ambulatory Visit: Payer: Self-pay | Admitting: Family Medicine

## 2018-01-15 ENCOUNTER — Encounter: Payer: Self-pay | Admitting: Internal Medicine

## 2018-01-15 ENCOUNTER — Ambulatory Visit (INDEPENDENT_AMBULATORY_CARE_PROVIDER_SITE_OTHER): Payer: Medicare Other | Admitting: Internal Medicine

## 2018-01-15 ENCOUNTER — Other Ambulatory Visit: Payer: Self-pay

## 2018-01-15 VITALS — BP 118/50 | HR 51 | Temp 98.1°F | Ht 72.0 in | Wt 202.6 lb

## 2018-01-15 DIAGNOSIS — I5042 Chronic combined systolic (congestive) and diastolic (congestive) heart failure: Secondary | ICD-10-CM | POA: Diagnosis not present

## 2018-01-15 NOTE — Progress Notes (Signed)
   [name] [name], MD Phone: [phone]  Reason For Visit: Hospital follow up  #Hospital follow-up from hyperkalemia and AKI  -Patient was found to have significant dehydration and hypotension during hospitilazation  He was evaluated by the heart failure team who found that  his heart failure had improved.  Therefore his antihypertensive medications were adjusted significantly.  Patient states that he has been doing very well.  PT recommended outpatient PT however patient is not interested in doing this.  Patient has an appointment to follow-up with heart failure team.  Patient denies any symptoms of dizziness, fatigue, dyspnea, chest pain.  States that he feels great  Past Medical History Reviewed problem list.  Medications- reviewed and updated No additions to family history Social history- patient is a non-smoker  Objective: BP (!) 118/50   Pulse (!) 51   Temp 98.1 F (36.7 C) (Oral)   Ht 6' (1.829 m)   Wt 202 lb 9.6 oz (91.9 kg)   SpO2 97%   BMI 27.48 kg/m  Gen: NAD, alert, cooperative with exam Cardio: regular rate and rhythm, S1S2 heard, no murmurs appreciated Pulm: clear to auscultation bilaterally, no wheezes, rhonchi or rales Skin: dry, intact, no rashes or lesions  Assessment/Plan: See problem based a/p  Chronic combined systolic and diastolic heart failure, NYHA class 3 (HCC) Significantly improved heart failure, EF of 55%, G1DD.  Previously with significant orthostasis and hypotension on  several diuretic, now much improved following.  - Continue spironolactone, coreg, and Entresto  - Basic Metabolic Panel - Follow up in 1-2 months

## 2018-01-15 NOTE — Patient Instructions (Addendum)
Please make an appointment with Dr. Zenovia Jarred at [facility] - need a repeat colonoscopy in June.  Follow up in about 2 months  '

## 2018-01-16 LAB — BASIC METABOLIC PANEL
BUN/Creatinine Ratio: 15 (ref 10–24)
BUN: 20 mg/dL (ref 8–27)
CO2: 22 mmol/L (ref 20–29)
CREATININE: 1.36 mg/dL — AB (ref 0.76–1.27)
Calcium: 9.3 mg/dL (ref 8.6–10.2)
Chloride: 105 mmol/L (ref 96–106)
GFR calc Af Amer: 64 mL/min/{1.73_m2} (ref >59)
GFR, EST NON AFRICAN AMERICAN: 55 mL/min/{1.73_m2} — AB (ref >59)
Glucose: 98 mg/dL (ref 65–99)
Potassium: 4.5 mmol/L (ref 3.5–5.2)
Sodium: 141 mmol/L (ref 134–144)

## 2018-01-17 ENCOUNTER — Encounter: Payer: Self-pay | Admitting: Internal Medicine

## 2018-01-17 NOTE — Assessment & Plan Note (Addendum)
Significantly improved heart failure, EF of 55%, G1DD.  Previously with significant orthostasis and hypotension on  several diuretic, now much improved following.  - Continue spironolactone, coreg, and Entresto  - Basic Metabolic Panel - Follow up in 1-2 months

## 2018-01-19 ENCOUNTER — Other Ambulatory Visit: Payer: Self-pay | Admitting: Family Medicine

## 2018-01-19 DIAGNOSIS — M545 Low back pain, unspecified: Secondary | ICD-10-CM

## 2018-01-22 ENCOUNTER — Encounter (HOSPITAL_COMMUNITY): Payer: Self-pay

## 2018-01-22 ENCOUNTER — Ambulatory Visit (HOSPITAL_COMMUNITY)
Admit: 2018-01-22 | Discharge: 2018-01-22 | Disposition: A | Discharge status: Home / Self Care | Payer: Medicare Other | Attending: Internal Medicine | Admitting: Internal Medicine

## 2018-01-22 VITALS — BP 130/78 | HR 67 | Wt 200.6 lb

## 2018-01-22 DIAGNOSIS — I428 Other cardiomyopathies: Secondary | ICD-10-CM | POA: Diagnosis not present

## 2018-01-22 DIAGNOSIS — I5082 Biventricular heart failure: Secondary | ICD-10-CM | POA: Diagnosis not present

## 2018-01-22 DIAGNOSIS — I130 Hypertensive heart and chronic kidney disease with heart failure and stage 1 through stage 4 chronic kidney disease, or unspecified chronic kidney disease: Secondary | ICD-10-CM | POA: Diagnosis not present

## 2018-01-22 DIAGNOSIS — R55 Syncope and collapse: Secondary | ICD-10-CM | POA: Insufficient documentation

## 2018-01-22 DIAGNOSIS — F1010 Alcohol abuse, uncomplicated: Secondary | ICD-10-CM | POA: Diagnosis not present

## 2018-01-22 DIAGNOSIS — Z09 Encounter for follow-up examination after completed treatment for conditions other than malignant neoplasm: Secondary | ICD-10-CM | POA: Diagnosis not present

## 2018-01-22 DIAGNOSIS — N179 Acute kidney failure, unspecified: Secondary | ICD-10-CM | POA: Diagnosis not present

## 2018-01-22 DIAGNOSIS — R911 Solitary pulmonary nodule: Secondary | ICD-10-CM | POA: Insufficient documentation

## 2018-01-22 DIAGNOSIS — I429 Cardiomyopathy, unspecified: Secondary | ICD-10-CM | POA: Insufficient documentation

## 2018-01-22 DIAGNOSIS — I5042 Chronic combined systolic (congestive) and diastolic (congestive) heart failure: Secondary | ICD-10-CM | POA: Diagnosis not present

## 2018-01-22 DIAGNOSIS — Z7982 Long term (current) use of aspirin: Secondary | ICD-10-CM | POA: Diagnosis not present

## 2018-01-22 DIAGNOSIS — Z79899 Other long term (current) drug therapy: Secondary | ICD-10-CM | POA: Insufficient documentation

## 2018-01-22 DIAGNOSIS — I159 Secondary hypertension, unspecified: Secondary | ICD-10-CM | POA: Diagnosis not present

## 2018-01-22 DIAGNOSIS — N183 Chronic kidney disease, stage 3 unspecified: Secondary | ICD-10-CM

## 2018-01-22 DIAGNOSIS — R0789 Other chest pain: Secondary | ICD-10-CM | POA: Insufficient documentation

## 2018-01-22 LAB — BASIC METABOLIC PANEL
Anion gap: 10 (ref 5–15)
BUN: 9 mg/dL (ref 6–20)
CALCIUM: 8.8 mg/dL — AB (ref 8.9–10.3)
CO2: 22 mmol/L (ref 22–32)
CREATININE: 1.46 mg/dL — AB (ref 0.61–1.24)
Chloride: 107 mmol/L (ref 101–111)
GFR, EST AFRICAN AMERICAN: 57 mL/min — AB (ref >60)
GFR, EST NON AFRICAN AMERICAN: 49 mL/min — AB (ref >60)
Glucose, Bld: 86 mg/dL (ref 65–99)
Potassium: 4.3 mmol/L (ref 3.5–5.1)
SODIUM: 139 mmol/L (ref 135–145)

## 2018-01-22 NOTE — Patient Instructions (Signed)
Routine lab work today. Will notify you of abnormal results, otherwise no news is good news!  No changes to medication at this time.  Follow up 3 months.  ____________________________________________________________ [name] Code:  Take all medication as prescribed the day of your appointment. Bring all medications with you to your appointment.  Do the following things EVERYDAY: 1) Weigh yourself in the morning before breakfast. Write it down and keep it in a log. 2) Take your medicines as prescribed 3) Eat low salt foods-Limit salt (sodium) to 2000 mg per day.  4) Stay as active as you can everyday 5) Limit all fluids for the day to less than 2 liters

## 2018-01-22 NOTE — Progress Notes (Signed)
ADVANCED HF CLINIC NOTE  Patient ID: [name], male   DOB: [birth year], 62 y.o.   MRN: [id number] PCP: [name].  HF MD: Dr Haroldine Laws   HPI: [name] is a 62 y.o. gentlemen with history of HTN, daily EtOH and previous diagnosis of biventricular heart failure with EF 15% with recovered EF. He has chronic atypical chest pain.   Admitted 4/10 through 412/2019. Treated for AKI and hypotension. Diuretics held.   Today he returns for HF follow up. Overall feeling fine. Denies SOB/PND/Orthopnea. Appetite ok. No fever or chills. Weight at home has been stable. Continues to drink 2 beers every other day. Taking all medications.   Echo 01/08/18: EF 55% with Mod LVH, Grade 1 DD, and RV mildly reduced  Echo 06/2017 EF ~ 50%  05/25/12 RHC/LHC  RA = 7  RV = 56/3/10  PA = 52/23 (34)  PCW = 17 v= 25  Fick cardiac output/index = 7.8/3.9  Thermo CO/CI = 6.1/3.0  PVR = 2.1 [place]  SVR = 1194  FA sat = 97%  PA sat = 79%, 80%  High SVC = 79%  EF 15%  Coronaries normal.  CPX 07/14/12: Peak VO2: 21.38ml/kg/min  % predicted peak VO2: 65.1%, VE/VCO2 slope: 25.0, OUES: 2.27, Peak RER: 1.12, Ventilatory Threshold: 12.4 % predicted peak VO2: 38.3%.    04/2012 Echo: LVEF 15%, diffuse hypokinesis. Grade 2 diastolic dysfunction. LA mildly dilated. RV moderately reduced. RA mildly dilated. PAPP 44 mmHg.  04/2012 Abd u/s: Small volume abdominal ascites. Cannot exclude mild cirrhosis.  04/2012 VQ scan: Very low probability for pulmonary embolism.  Echo 08/06/12: LVEF 57%, grade 1 diastolic dysfunction.  RV recovered.  Echo 7/14: LVEF 50-55% Even monitor: lots of transmitted episodes. Tracing all NSR with no ectopy. 01/2016 EF 35-40%.   07/2016: EF 35-40%.  Review of systems complete and found to be negative unless listed in HPI.    Past Medical History:  Diagnosis Date  . Alcohol abuse   . CHF (congestive heart failure) ([place])   . Chronic combined systolic and diastolic heart failure, NYHA  class 3 ([place]) 06/02/2012  . Chronic systolic heart failure ([place]) 06/02/2012  . Hypertension   . NICM (nonischemic cardiomyopathy) ([place]) 09/04/2012  . Psoriasis    Possible psoriasis in the past    Current Outpatient Medications  Medication Sig Dispense Refill  . acetaminophen (TYLENOL 8 HOUR ARTHRITIS PAIN) 650 MG CR tablet Take 1 tablet (650 mg total) by mouth every 8 (eight) hours as needed for pain. 30 tablet 0  . aspirin 81 MG EC tablet TAKE 1 TABLET (81 MG TOTAL) BY MOUTH DAILY. 30 tablet 3  . carvedilol (COREG) 3.125 MG tablet Take 1 tablet (3.125 mg total) by mouth 2 (two) times daily with a meal. 60 tablet 0  . sacubitril-valsartan (ENTRESTO) 24-26 MG Take 1 tablet by mouth 2 (two) times daily. 60 tablet 0  . spironolactone (ALDACTONE) 25 MG tablet Take 25 mg by mouth daily.    Marland Kitchen triamcinolone cream (KENALOG) 0.5 % APPLY TO THE AFFECTED AREA(S) TOPICALLY EVERY DAY AS NEEDED. 30 g 1  . nitroGLYCERIN (NITROSTAT) 0.4 MG SL tablet PLACE 1 TABLET UNDER THE TONGUE EVERY 5 MINUTES AS NEEDED FOR CHEST PAIN. (Patient not taking: Reported on 01/22/2018) 10 tablet 0   No current facility-administered medications for this encounter.    PHYSICAL EXAM: Vitals:   01/22/18 1420  BP: 130/78  Pulse: 67  SpO2: 97%  Weight: 200 lb 9.6 oz (91 kg)  Wt Readings from Last 3 Encounters:  01/22/18 200 lb 9.6 oz (91 kg)  01/15/18 202 lb 9.6 oz (91.9 kg)  01/07/18 200 lb 8 oz (90.9 kg)    General:  Well appearing. No resp difficulty HEENT: normal Neck: supple. no JVD. Carotids 2+ bilat; no bruits. No lymphadenopathy or thryomegaly appreciated. Cor: PMI nondisplaced. Regular rate & rhythm. No rubs, gallops or murmurs. Lungs: clear Abdomen: soft, nontender, nondistended. No hepatosplenomegaly. No bruits or masses. Good bowel sounds. Extremities: no cyanosis, clubbing, rash, edema Neuro: alert & orientedx3, cranial nerves grossly intact. moves all 4 extremities w/o difficulty. Affect  pleasant   ASSESSMENT & PLAN: 1. Uncontrolled HTN- Stable today.  2.Chronic Systolic Heart Failure.  H/O NICM - 01/2016 Echo EF 35-40%.  - Echo 06/2017 EF 40-45% -EF recovered. --->ECHo 2019 EF 55%.  - Volume status stable. Does not need lasix.  -  Continue coreg 3.125 mg twice a day BID - Continue entresto 24-26 mg.  Check BMET today.  BMET today.  - Remains on imdur 30 mg daily for distant history of atypical chest pain.  - Continue spiro 25 mg daily.  3. H/O syncope  - No further. Episode of near-syncope in October was isoloated and has not recurred.  Resolved. .   4. H/O ETOH- 5. CKD stage III - recent AKI - Check BMET today.  6. Pulmonary Nodule   Follow up in 3 months. Check BMET today.  Darrick Grinder, NP

## 2018-01-27 ENCOUNTER — Encounter: Payer: Self-pay | Admitting: Internal Medicine

## 2018-02-16 ENCOUNTER — Encounter: Payer: Self-pay | Admitting: Internal Medicine

## 2018-02-16 MED FILL — SPIRONOLACTONE 25 MG TABLET: 25 | 60 days supply | Qty: 30 | Fill #0

## 2018-02-16 MED FILL — TRIAMCINOLONE 0.5% CREAM: 0.5 | 10 days supply | Qty: 30 | Fill #1

## 2018-02-17 ENCOUNTER — Telehealth: Payer: Self-pay | Admitting: Internal Medicine

## 2018-02-17 NOTE — Telephone Encounter (Signed)
Pt called and said he received a voicemail saying he needed to set up an appointment with [place] due to not have being seen recently. He said he wasn't sure why because he was just here in April. He would like to know if he still needs to make an appointment. I looked in chart and did not see any notes. Pt would like to be contacted if he does need to set up an appointment.

## 2018-02-18 NOTE — Telephone Encounter (Signed)
I did not call this patient, he should however follow up in about 1 month at some point in June to make sure current regiment is doing well

## 2018-02-25 NOTE — Telephone Encounter (Signed)
Called patient to set up appointment per Dr. Emmaline Life and there was no answer. Left message to call back and schedule an appointment for follow up in June.  Ozella Almond, [place]

## 2018-03-05 ENCOUNTER — Other Ambulatory Visit: Payer: Self-pay | Admitting: Internal Medicine

## 2018-03-05 DIAGNOSIS — L309 Dermatitis, unspecified: Secondary | ICD-10-CM

## 2018-03-05 MED FILL — TRIAMCINOLONE 0.5% CREAM: 0.5 | 15 days supply | Qty: 30 | Fill #0

## 2018-03-09 MED FILL — COLCRYS 0.6 MG TABLET: 0.6 | 5 days supply | Qty: 10 | Fill #1

## 2018-03-11 ENCOUNTER — Ambulatory Visit (HOSPITAL_COMMUNITY)
Admission: RE | Admit: 2018-03-11 | Discharge: 2018-03-11 | Disposition: A | Discharge status: Home / Self Care | Payer: Medicare Other | Source: Ambulatory Visit | Attending: Family Medicine | Admitting: Family Medicine

## 2018-03-11 ENCOUNTER — Encounter: Payer: Self-pay | Admitting: Internal Medicine

## 2018-03-11 ENCOUNTER — Ambulatory Visit (INDEPENDENT_AMBULATORY_CARE_PROVIDER_SITE_OTHER): Payer: Medicare Other | Admitting: Internal Medicine

## 2018-03-11 VITALS — BP 122/80 | HR 77 | Temp 98.4°F | Wt 190.8 lb

## 2018-03-11 DIAGNOSIS — I159 Secondary hypertension, unspecified: Secondary | ICD-10-CM | POA: Diagnosis not present

## 2018-03-11 DIAGNOSIS — I5042 Chronic combined systolic (congestive) and diastolic (congestive) heart failure: Secondary | ICD-10-CM

## 2018-03-11 DIAGNOSIS — M25561 Pain in right knee: Secondary | ICD-10-CM | POA: Diagnosis not present

## 2018-03-11 DIAGNOSIS — R911 Solitary pulmonary nodule: Secondary | ICD-10-CM

## 2018-03-11 DIAGNOSIS — K635 Polyp of colon: Secondary | ICD-10-CM

## 2018-03-11 MED ORDER — PREDNISONE 20 MG PO TABS: 20.0000 mg | ORAL_TABLET | Freq: Every day | ORAL | 0 refills | Status: DC

## 2018-03-11 MED FILL — predniSONE 20 MG TABS: 20 | 5 days supply | Qty: 5 | Fill #0

## 2018-03-11 NOTE — Progress Notes (Signed)
   [facility] [name], MD Phone: [phone]  Reason For Visit: Follow up   # CHF patient states that he is feeling great Currently taking Entresto, Coreg, Spironolactone. No swelling in his legs No dyspnea, no dizziness, no chest pain   #CHRONIC HTN: Reports no issues with the regiment  Current Meds - as above  Reports good compliance,took meds today. Tolerating well, w/o complaints. Denies CP, dyspnea, HA, edema, dizziness / lightheadedness  # Right knee pain - Hx of gout, usually in big toe, never had it his knee before  - Right knee pain since Saturday  - Noticed to be swollen on Saturday has improved since then  - Took colchines and since then has improved - swelling has almost gone away  - 1-2 episode a year - No warmth or erythema  - No fever, chills, nausea or vomiting .    Past Medical History Reviewed problem list.  Medications- reviewed and updated No additions to family history Social history- patient is a non- smoker  Objective: BP 122/80 (BP Location: Left Arm, Patient Position: Sitting, Cuff Size: Normal)   Pulse 77   Temp 98.4 F (36.9 C) (Oral)   Wt 190 lb 12.8 oz (86.5 kg)   SpO2 98%   BMI 25.88 kg/m  Gen: NAD, alert, cooperative with exam Cardio: regular rate and rhythm, S1S2 heard, no murmurs appreciated Pulm: clear to auscultation bilaterally, no wheezes, rhonchi or rales MSK: No fluid notable in right knee compared to left knee, no erythema no warmth, pain with flexion internal and external rotation, normal strength, no calf tenderness, no dorsal flexion pain  skin: dry, intact, no rashes or lesions   Assessment/Plan: See problem based a/p  Colon polyp July 12th 2019 schedule for colonoscopy   Pulmonary nodule, right Repeat CT in 2017 benign nodule, no further follow up needed.   Chronic combined systolic and diastolic heart failure, NYHA class 3 (HCC) No signs of fluid overload, no symptoms  Continue  current regiment  Follow up with cardiology  Follow up in 2-3 months   Hypertension Well controlled  Continue current regiment   Acute pain of right knee Gout versus osteoarthritis.  No concern for DVT given physical exam.  No concern for infectious joint given physical exam -We will provide patient with prednisone given history of heart disease -Will obtain knee x-rays -Follow-up in 2 weeks if no improvement

## 2018-03-11 NOTE — Assessment & Plan Note (Signed)
July 12th 2019 schedule for colonoscopy

## 2018-03-11 NOTE — Assessment & Plan Note (Signed)
No signs of fluid overload, no symptoms  Continue current regiment  Follow up with cardiology  Follow up in 2-3 months

## 2018-03-11 NOTE — Assessment & Plan Note (Signed)
Well controlled  Continue current regiment

## 2018-03-11 NOTE — Assessment & Plan Note (Signed)
Repeat CT in 2017 benign nodule, no further follow up needed.

## 2018-03-11 NOTE — Patient Instructions (Addendum)
I am going to give you  5 days of prednisone. Please do xrays on right knee at [facility] as this may be osteoarthritis and not gout.  Follow-up if no improvement in 2 weeks

## 2018-03-11 NOTE — Assessment & Plan Note (Signed)
Gout versus osteoarthritis.  No concern for DVT given physical exam.  No concern for infectious joint given physical exam -We will provide patient with prednisone given history of heart disease -Will obtain knee x-rays -Follow-up in 2 weeks if no improvement

## 2018-03-12 ENCOUNTER — Encounter: Payer: Self-pay | Admitting: Internal Medicine

## 2018-03-12 LAB — BASIC METABOLIC PANEL
BUN/Creatinine Ratio: 11 (ref 10–24)
BUN: 15 mg/dL (ref 8–27)
CALCIUM: 9 mg/dL (ref 8.6–10.2)
CHLORIDE: 102 mmol/L (ref 96–106)
CO2: 22 mmol/L (ref 20–29)
Creatinine, Ser: 1.35 mg/dL — ABNORMAL HIGH (ref 0.76–1.27)
GFR calc Af Amer: 64 mL/min/{1.73_m2} (ref >59)
GFR calc non Af Amer: 55 mL/min/{1.73_m2} — ABNORMAL LOW (ref >59)
GLUCOSE: 94 mg/dL (ref 65–99)
Potassium: 5 mmol/L (ref 3.5–5.2)
Sodium: 140 mmol/L (ref 134–144)

## 2018-03-20 ENCOUNTER — Ambulatory Visit (INDEPENDENT_AMBULATORY_CARE_PROVIDER_SITE_OTHER): Payer: Medicare Other | Admitting: Internal Medicine

## 2018-03-20 ENCOUNTER — Encounter: Payer: Self-pay | Admitting: Internal Medicine

## 2018-03-20 ENCOUNTER — Other Ambulatory Visit: Payer: Self-pay

## 2018-03-20 VITALS — BP 102/66 | HR 75 | Temp 98.2°F | Wt 193.0 lb

## 2018-03-20 DIAGNOSIS — M25561 Pain in right knee: Secondary | ICD-10-CM

## 2018-03-20 MED ORDER — PREDNISONE 20 MG PO TABS: ORAL_TABLET | ORAL | 0 refills | Status: DC

## 2018-03-20 MED ORDER — PREDNISONE 50 MG PO TABS: 50.0000 mg | ORAL_TABLET | Freq: Every day | ORAL | 0 refills | Status: DC

## 2018-03-20 MED FILL — predniSONE 10 MG TABS: 10 | 10 days supply | Qty: 28 | Fill #0

## 2018-03-20 NOTE — Patient Instructions (Addendum)
It was nice seeing you today.  I want you to continue taking the prednisone that you have taken before. Take 2 tablets for 5 days, 1 tablet for 3 days, 1/2 tablet for 2 days. Please follow up in two weeks if no improvement

## 2018-03-20 NOTE — Progress Notes (Signed)
   [facility] [name], MD Phone: [phone]  Reason For Visit: SDA for Right Knee Pain   # Right Knee Pain  - Patient states he continues have pain and swelling in his right knee.  Patient notable for right knee swelling compared to left.  Pain throughout. X-ray was done on the 21st which did not show any abnormalities in right knee for osteoarthritis.Patient was given a short course of prednisone but feels like this has not helped significantly. Patient with a history of gout.  No erythema, no warmth. No fevers or chills.  Past Medical History Reviewed problem list.  Medications- reviewed and updated No additions to family history Social history- patient is a non-smoker  Objective: BP 102/66   Pulse 75   Temp 98.2 F (36.8 C) (Oral)   Wt 193 lb (87.5 kg)   SpO2 97%   BMI 26.18 kg/m  Gen: NAD, alert, cooperative with exam MSK: Mild right knee swelling, no erythema or warmth, tenderness to palpation of right knee, ROM decreased due to pain,  5 out of 5 strength in the leg, neurovascularly intact Skin: dry, intact, no rashes or lesions   After consent was obtained, using sterile technique the right knee was prepped. The knee joint was entered, however no fluid was removed. The procedure was well tolerated.  The patient is asked to continue to rest the knee for a few more days before resuming regular activities.  It may be more painful for the first 1-2 days.  Watch for fever, or increased swelling or persistent pain in knee. Call or return to clinic prn if such symptoms occur or the knee fails to improve as anticipated.   Assessment/Plan: See problem based a/p  Acute pain of right knee No signs of infected joint, likely osteoarthritis versus gout.  Provide patient with a longer course of prednisone.  Continued to try to draw out fluid from the knee however was not successful - predniSONE (DELTASONE) 20 MG tablet; Take 2 tablets for 5 days, 1 tablet  for 3 days, 1/2 tablet for 2 days  Dispense: 14 tablet; Refill: 0 -Follow-up in 2 weeks

## 2018-03-23 NOTE — Assessment & Plan Note (Addendum)
No signs of infected joint, likely osteoarthritis versus gout.  Provide patient with a longer course of prednisone.  Continued to try to draw out fluid from the knee however was not successful - predniSONE (DELTASONE) 20 MG tablet; Take 2 tablets for 5 days, 1 tablet for 3 days, 1/2 tablet for 2 days  Dispense: 14 tablet; Refill: 0 -Follow-up in 2 weeks

## 2018-03-27 ENCOUNTER — Ambulatory Visit (AMBULATORY_SURGERY_CENTER): Payer: Self-pay

## 2018-03-27 VITALS — Ht 72.0 in | Wt 195.6 lb

## 2018-03-27 DIAGNOSIS — Z8371 Family history of colonic polyps: Secondary | ICD-10-CM

## 2018-03-27 DIAGNOSIS — Z86010 Personal history of colonic polyps: Secondary | ICD-10-CM

## 2018-03-27 MED ORDER — NA SULFATE-K SULFATE-MG SULF 17.5-3.13-1.6 GM/177ML PO SOLN: 1.0000 | Freq: Once | ORAL | 0 refills | Status: AC

## 2018-03-27 MED FILL — SUPREP BOWEL PREP KIT: 17.5-3.13-1 | 1 days supply | Qty: 354 | Fill #0

## 2018-03-27 NOTE — Progress Notes (Signed)
Per pt, no allergies to soy or egg products.Pt not taking any weight loss meds or using  O2 at home.  Pt refused emmi video. 

## 2018-04-08 ENCOUNTER — Other Ambulatory Visit: Payer: Self-pay | Admitting: Internal Medicine

## 2018-04-08 ENCOUNTER — Telehealth: Payer: Self-pay | Admitting: Student in an Organized Health Care Education/Training Program

## 2018-04-08 ENCOUNTER — Telehealth: Payer: Self-pay

## 2018-04-08 ENCOUNTER — Other Ambulatory Visit: Payer: Self-pay | Admitting: Student in an Organized Health Care Education/Training Program

## 2018-04-08 DIAGNOSIS — M545 Low back pain, unspecified: Secondary | ICD-10-CM

## 2018-04-08 DIAGNOSIS — M25561 Pain in right knee: Secondary | ICD-10-CM

## 2018-04-08 DIAGNOSIS — M109 Gout, unspecified: Secondary | ICD-10-CM

## 2018-04-08 MED ORDER — ACETAMINOPHEN ER 650 MG PO TBCR: 650.0000 mg | EXTENDED_RELEASE_TABLET | Freq: Three times a day (TID) | ORAL | 0 refills | Status: DC | PRN

## 2018-04-08 MED ORDER — COLCHICINE 0.6 MG PO TABS: 0.6000 mg | ORAL_TABLET | Freq: Every day | ORAL | 0 refills | Status: DC

## 2018-04-08 MED FILL — TRIAMCINOLONE 0.5% CREAM: 0.5 | 15 days supply | Qty: 30 | Fill #1

## 2018-04-08 MED FILL — COLCRYS 0.6 MG TABLET: 0.6 | 28 days supply | Qty: 30 | Fill #0

## 2018-04-08 NOTE — Progress Notes (Signed)
Received prescription refill request from patient for colchicine for acute gout flare and prednisone for knee pain. I called patient and we discussed these problems and he has scheduled an appointment to discuss them in person in 1 week (04/15/18). I filled his prescription for colchicine and tylenol.

## 2018-04-08 NOTE — Telephone Encounter (Signed)
Pt has an apt next week, pt informed steroid will not be called in until he is assessed. However, Colchicine has been called in to his pharmacy.

## 2018-04-08 NOTE — Telephone Encounter (Signed)
Introduced myself as new PCP. Discussed refilling colcocine for gout flare and tylenol for pain (knee). Will follow up at appointment next Wednesday.

## 2018-04-08 NOTE — Telephone Encounter (Signed)
Pt called nurse line requesting a refill on Colchicine 0.6mg  for a current gout flare. I informed pt I do not see this on his current med list, but I will send to his pcp for approval.

## 2018-04-08 NOTE — Telephone Encounter (Signed)
Hello, I actually denied the refill requests for colchicine and the steroid because it is an ongoing issue that hasn't been resolved with those medications and he should be seen again to ensure the correct diagnosis and possible medication change.  If it is gout, he is probably in pain, so can he get scheduled ASAP? PS I'm not sure how the system works here yet so if there is a better way for me to do this, please let me know. Thanks, [employer]

## 2018-04-10 ENCOUNTER — Ambulatory Visit (AMBULATORY_SURGERY_CENTER): Payer: Medicare Other | Admitting: Internal Medicine

## 2018-04-10 ENCOUNTER — Other Ambulatory Visit: Payer: Self-pay | Admitting: Internal Medicine

## 2018-04-10 ENCOUNTER — Other Ambulatory Visit: Payer: Self-pay

## 2018-04-10 ENCOUNTER — Encounter: Payer: Self-pay | Admitting: Internal Medicine

## 2018-04-10 VITALS — BP 139/72 | HR 68 | Temp 99.1°F | Resp 13 | Ht 72.0 in | Wt 195.0 lb

## 2018-04-10 DIAGNOSIS — D125 Benign neoplasm of sigmoid colon: Secondary | ICD-10-CM

## 2018-04-10 DIAGNOSIS — I509 Heart failure, unspecified: Secondary | ICD-10-CM | POA: Diagnosis not present

## 2018-04-10 DIAGNOSIS — I10 Essential (primary) hypertension: Secondary | ICD-10-CM | POA: Diagnosis not present

## 2018-04-10 DIAGNOSIS — M25561 Pain in right knee: Secondary | ICD-10-CM

## 2018-04-10 DIAGNOSIS — Z86010 Personal history of colonic polyps: Secondary | ICD-10-CM | POA: Diagnosis not present

## 2018-04-10 MED ORDER — SODIUM CHLORIDE 0.9 % IV SOLN
500.0000 mL | INTRAVENOUS | Status: DC
Start: 2018-04-10 — End: 2021-01-16

## 2018-04-10 NOTE — Progress Notes (Signed)
Pt's states no medical or surgical changes since previsit or office visit. 

## 2018-04-10 NOTE — Progress Notes (Signed)
Called to room to assist during endoscopic procedure.  Patient ID and intended procedure confirmed with present staff. Received instructions for my participation in the procedure from the performing physician.  

## 2018-04-10 NOTE — Op Note (Signed)
[name] Procedure Date: 04/10/2018 9:41 AM MRN: [id number] Endoscopist: Jerene Bears , MD Age: 62 Referring MD:  Date of Birth: 07-17-1956 Gender: Male Account #: [id number] Procedure:                Colonoscopy Indications:              High risk colon cancer surveillance: Personal                            history of adenoma with high grade dysplasia,                            personal history of multiple (3 or more) adenomas,                            Last colonoscopy 1 year ago Medicines:                Monitored Anesthesia Care Procedure:                Pre-Anesthesia Assessment:                           - Prior to the procedure, a History and Physical                            was performed, and patient medications and                            allergies were reviewed. The patient's tolerance of                            previous anesthesia was also reviewed. The risks                            and benefits of the procedure and the sedation                            options and risks were discussed with the patient.                            All questions were answered, and informed consent                            was obtained. Prior Anticoagulants: The patient has                            taken no previous anticoagulant or antiplatelet                            agents. ASA Grade Assessment: II - A patient with                            mild systemic disease. After reviewing the risks  and benefits, the patient was deemed in                            satisfactory condition to undergo the procedure.                           After obtaining informed consent, the colonoscope                            was passed under direct vision. Throughout the                            procedure, the patient's blood pressure, pulse, and                            oxygen saturations were monitored  continuously. The                            Colonoscope was introduced through the anus and                            advanced to the cecum, identified by appendiceal                            orifice and ileocecal valve. The colonoscopy was                            performed without difficulty. The patient tolerated                            the procedure well. The quality of the bowel                            preparation was excellent. The ileocecal valve,                            appendiceal orifice, and rectum were photographed. Scope In: 9:56:53 AM Scope Out: 10:09:59 AM Scope Withdrawal Time: 0 hours 9 minutes 54 seconds  Total Procedure Duration: 0 hours 13 minutes 6 seconds  Findings:                 The digital rectal exam was normal.                           There was a medium-sized lipoma, in the ascending                            colon.                           A 4 mm polyp was found in the sigmoid colon. The                            polyp was sessile. The polyp was removed with a  cold snare. Resection and retrieval were complete.                           The exam was otherwise without abnormality on                            direct and retroflexion views. Complications:            No immediate complications. Estimated Blood Loss:     Estimated blood loss was minimal. Impression:               - Medium-sized lipoma in the ascending colon.                           - One 4 mm polyp in the sigmoid colon, removed with                            a cold snare. Resected and retrieved.                           - The examination was otherwise normal on direct                            and retroflexion views. Recommendation:           - Patient has a contact number available for                            emergencies. The signs and symptoms of potential                            delayed complications were discussed with the                             patient. Return to normal activities tomorrow.                            Written discharge instructions were provided to the                            patient.                           - Resume previous diet.                           - Continue present medications.                           - Await pathology results.                           - Repeat colonoscopy in 3 years for surveillance. Jerene Bears, MD 04/10/2018 10:17:31 AM This report has been signed electronically.

## 2018-04-10 NOTE — Patient Instructions (Signed)
Handouts given to patient on polyps.  YOU HAD AN ENDOSCOPIC PROCEDURE TODAY AT [facility]:   Refer to the procedure report that was given to you for any specific questions about what was found during the examination.  If the procedure report does not answer your questions, please call your gastroenterologist to clarify.  If you requested that your care partner not be given the details of your procedure findings, then the procedure report has been included in a sealed envelope for you to review at your convenience later.  YOU SHOULD EXPECT: Some feelings of bloating in the abdomen. Passage of more gas than usual.  Walking can help get rid of the air that was put into your GI tract during the procedure and reduce the bloating. If you had a lower endoscopy (such as a colonoscopy or flexible sigmoidoscopy) you may notice spotting of blood in your stool or on the toilet paper. If you underwent a bowel prep for your procedure, you may not have a normal bowel movement for a few days.  Please Note:  You might notice some irritation and congestion in your nose or some drainage.  This is from the oxygen used during your procedure.  There is no need for concern and it should clear up in a day or so.  SYMPTOMS TO REPORT IMMEDIATELY:   Following lower endoscopy (colonoscopy or flexible sigmoidoscopy):  Excessive amounts of blood in the stool  Significant tenderness or worsening of abdominal pains  Swelling of the abdomen that is new, acute  Fever of 100F or higher   For urgent or emergent issues, a gastroenterologist can be reached at any hour by calling [phone].   DIET:  We do recommend a small meal at first, but then you may proceed to your regular diet.  Drink plenty of fluids but you should avoid alcoholic beverages for 24 hours.  ACTIVITY:  You should plan to take it easy for the rest of today and you should NOT DRIVE or use heavy machinery until tomorrow (because of the  sedation medicines used during the test).    FOLLOW UP: Our staff will call the number listed on your records the next business day following your procedure to check on you and address any questions or concerns that you may have regarding the information given to you following your procedure. If we do not reach you, we will leave a message.  However, if you are feeling well and you are not experiencing any problems, there is no need to return our call.  We will assume that you have returned to your regular daily activities without incident.  If any biopsies were taken you will be contacted by phone or by letter within the next 1-3 weeks.  Please call us at [phone] if you have not heard about the biopsies in 3 weeks.    SIGNATURES/CONFIDENTIALITY: You and/or your care partner have signed paperwork which will be entered into your electronic medical record.  These signatures attest to the fact that that the information above on your After Visit Summary has been reviewed and is understood.  Full responsibility of the confidentiality of this discharge information lies with you and/or your care-partner.

## 2018-04-10 NOTE — Progress Notes (Signed)
Report given to PACU, vss 

## 2018-04-13 ENCOUNTER — Telehealth: Payer: Self-pay

## 2018-04-13 ENCOUNTER — Telehealth: Payer: Self-pay | Admitting: *Deleted

## 2018-04-13 NOTE — Telephone Encounter (Signed)
  Follow up Call-  Call back number 04/10/2018 03/03/2017  Post procedure Call Back phone  # [phone]  Permission to leave phone message Yes Yes  Some recent data might be hidden     Patient questions:  Do you have a fever, pain , or abdominal swelling? No. Pain Score  0 *  Have you tolerated food without any problems? Yes.    Have you been able to return to your normal activities? Yes.    Do you have any questions about your discharge instructions: Diet   No. Medications  No. Follow up visit  No.  Do you have questions or concerns about your Care? No.  Actions: * If pain score is 4 or above: No action needed, pain <4.

## 2018-04-13 NOTE — Telephone Encounter (Signed)
Called [phone] and left a messaged we tried to reach pt for a follow up call. maw

## 2018-04-15 ENCOUNTER — Ambulatory Visit (INDEPENDENT_AMBULATORY_CARE_PROVIDER_SITE_OTHER): Payer: Medicare Other | Admitting: Student in an Organized Health Care Education/Training Program

## 2018-04-15 ENCOUNTER — Encounter: Payer: Self-pay | Admitting: Student in an Organized Health Care Education/Training Program

## 2018-04-15 ENCOUNTER — Other Ambulatory Visit: Payer: Self-pay

## 2018-04-15 VITALS — BP 110/60 | HR 70 | Temp 98.7°F | Ht 72.0 in | Wt 189.0 lb

## 2018-04-15 DIAGNOSIS — I10 Essential (primary) hypertension: Secondary | ICD-10-CM | POA: Diagnosis not present

## 2018-04-15 DIAGNOSIS — Z23 Encounter for immunization: Secondary | ICD-10-CM | POA: Insufficient documentation

## 2018-04-15 DIAGNOSIS — M109 Gout, unspecified: Secondary | ICD-10-CM | POA: Diagnosis present

## 2018-04-15 HISTORY — DX: Encounter for immunization: Z23

## 2018-04-15 MED ORDER — TETANUS-DIPHTH-ACELL PERTUSSIS 5-2.5-18.5 LF-MCG/0.5 IM SUSP: 0.5000 mL | Freq: Once | INTRAMUSCULAR | 0 refills | Status: AC

## 2018-04-15 NOTE — Assessment & Plan Note (Signed)
Resolved. Patient completed course of colchicine for acute flare last week for right foot toe. Return visit in 3 months

## 2018-04-15 NOTE — Assessment & Plan Note (Signed)
Well-controlled on current medication. Return in 3 months to check potassium level.

## 2018-04-15 NOTE — Patient Instructions (Signed)
It was a pleasure to see you today!  To summarize our discussion for this visit:  I'm so glad your knee is feeling better!  We discussed your medication list and confirmed that you were taking the correct doses of everything.  Referred you to the pharmacy for your tetanus vaccine.    Please return to our clinic to see me in 3 months for following your chronic conditions.  Call the clinic at [phone] if your symptoms worsen or you have any concerns.  Thank you for allowing me to take part in your care,  Dr. Doristine Mango   Thanks for choosing [facility] for your primary care.

## 2018-04-15 NOTE — Progress Notes (Signed)
   Subjective:    Patient ID: [name], male    DOB: September 11, 1956, 62 y.o.   MRN: [id number]   CC: 62 year old male here for regular follow-up visit.   HPI: His toe gout flare from last week has resolved.  His right knee pain has resolved.  He has no other complaints or concerns today.  He is due for Tdap vaccination.  Smoking status reviewed   ROS: pertinent noted in the HPI   Past Medical History:  Diagnosis Date  . Alcohol abuse    still drink beer  . CHF (congestive heart failure) ([place])   . Chronic combined systolic and diastolic heart failure, NYHA class 3 ([place]) 06/02/2012  . Chronic systolic heart failure ([place]) 06/02/2012  . Hypertension   . Need for prophylactic vaccination and inoculation against single disease 04/15/2018  . NICM (nonischemic cardiomyopathy) ([place]) 09/04/2012  . Psoriasis    Possible psoriasis in the past  . Pulmonary nodule, right 11/03/2012    Past Surgical History:  Procedure Laterality Date  . LEFT AND RIGHT HEART CATHETERIZATION WITH CORONARY ANGIOGRAM N/A 05/25/2012   Procedure: LEFT AND RIGHT HEART CATHETERIZATION WITH CORONARY ANGIOGRAM;  Surgeon: Jolaine Artist, MD;  Location: [facility] CATH LAB;  Service: Cardiovascular;  Laterality: N/A;  . SKIN GRAFT     L arm got caught in machine in 1979    Past medical history, surgical, family, and social history reviewed and updated in the EMR as appropriate.  Objective:  BP 110/60   Pulse 70   Temp 98.7 F (37.1 C) (Oral)   Ht 6' (1.829 m)   Wt 189 lb (85.7 kg)   SpO2 95%   BMI 25.63 kg/m   Vitals and nursing note reviewed  General: NAD, pleasant, able to participate in exam Cardiac: RRR, normal heart sounds, no murmurs. 2+ radial and PT pulses bilaterally Respiratory: CTAB, normal effort, No wheezes, rales or rhonchi Extremities: no edema or cyanosis. WWP. Skin: warm and dry, eczema plaques present forearms bilaterally.  Skin color discoloration on palms and fingers and left arm. Neuro:  alert and oriented x4, no focal deficits Psych: Normal affect and mood   Assessment & Plan:    Gout Resolved. Patient completed course of colchicine for acute flare last week for right foot toe. Return visit in 3 months  Hypertension Well-controlled on current medication. Return in 3 months to check potassium level.  Right knee pain Resolved Return if symptoms return  Health maintenance Recently had colonoscopy GI found polyp and removed.  Follow-up in 3 years. Due for Tdap vaccine.  Prescribed to receive today at pharmacy.    Doristine Mango, [facility] PGY-1

## 2018-04-17 ENCOUNTER — Encounter: Payer: Self-pay | Admitting: Internal Medicine

## 2018-04-22 NOTE — Progress Notes (Signed)
ADVANCED HF CLINIC NOTE  Patient ID: [name], male   DOB: 07-31-1956, 62 y.o.   MRN: [id number] PCP: [facility].  HF MD: [name]   HPI: [name] is a 62 y.o. gentlemen with history of HTN, daily EtOH and previous diagnosis of biventricular heart failure with EF 15% with recovered EF. He has chronic atypical chest pain.   Admitted 4/10 through 412/2019. Treated for AKI and hypotension. Diuretics held.   Today he returns for HF follow up. Overall doing "wonderfully". Denies SOB. Able to walk up stairs and hills with no problems. Stays busy in the garden. Denies orthopnea, PND, or edema. No CP or dizziness. He is drinking 2 beers every other day. No tobacco use. Taking all medications. Weights ~190 lbs at home.   Echo 01/08/18: EF 55% with Mod LVH, Grade 1 DD, and RV mildly reduced  Echo 06/2017 EF ~ 50%  05/25/12 RHC/LHC  RA = 7  RV = 56/3/10  PA = 52/23 (34)  PCW = 17 v= 25  Fick cardiac output/index = 7.8/3.9  Thermo CO/CI = 6.1/3.0  PVR = 2.1 Woods  SVR = 1194  FA sat = 97%  PA sat = 79%, 80%  High SVC = 79%  EF 15%  Coronaries normal.  CPX 07/14/12: Peak VO2: 21.44ml/kg/min  % predicted peak VO2: 65.1%, VE/VCO2 slope: 25.0, OUES: 2.27, Peak RER: 1.12, Ventilatory Threshold: 12.4 % predicted peak VO2: 38.3%.    04/2012 Echo: LVEF 15%, diffuse hypokinesis. Grade 2 diastolic dysfunction. LA mildly dilated. RV moderately reduced. RA mildly dilated. PAPP 44 mmHg.  04/2012 Abd u/s: Small volume abdominal ascites. Cannot exclude mild cirrhosis.  04/2012 VQ scan: Very low probability for pulmonary embolism.  Echo 08/06/12: LVEF 85%, grade 1 diastolic dysfunction.  RV recovered.  Echo 7/14: LVEF 50-55% Even monitor: lots of transmitted episodes. Tracing all NSR with no ectopy. 01/2016 EF 35-40%.   07/2016: EF 35-40%.  Review of systems complete and found to be negative unless listed in HPI.    Past Medical History:  Diagnosis Date  . Alcohol abuse    still  drink beer  . CHF (congestive heart failure) ([name])   . Chronic combined systolic and diastolic heart failure, NYHA class 3 ([name]) 06/02/2012  . Chronic systolic heart failure ([place]) 06/02/2012  . Hypertension   . Need for prophylactic vaccination and inoculation against single disease 04/15/2018  . NICM (nonischemic cardiomyopathy) ([name]) 09/04/2012  . Psoriasis    Possible psoriasis in the past  . Pulmonary nodule, right 11/03/2012    Current Outpatient Medications  Medication Sig Dispense Refill  . acetaminophen (TYLENOL 8 HOUR ARTHRITIS PAIN) 650 MG CR tablet Take 1 tablet (650 mg total) by mouth every 8 (eight) hours as needed for pain. 30 tablet 0  . aspirin 81 MG EC tablet TAKE 1 TABLET (81 MG TOTAL) BY MOUTH DAILY. 30 tablet 3  . carvedilol (COREG) 3.125 MG tablet Take 1 tablet (3.125 mg total) by mouth 2 (two) times daily with a meal. 60 tablet 0  . sacubitril-valsartan (ENTRESTO) 24-26 MG Take 1 tablet by mouth 2 (two) times daily. 60 tablet 0  . spironolactone (ALDACTONE) 25 MG tablet Take 25 mg by mouth daily.    Marland Kitchen triamcinolone cream (KENALOG) 0.5 % APPLY TO THE AFFECTED AREA(S) TOPICALLY EVERY DAY AS NEEDED. 30 g 1  . colchicine 0.6 MG tablet Take 1 tablet (0.6 mg total) by mouth daily. Take 2 tablets 1st day (1.2mg  total). Followed by  1 tablet daily (0.6mg  total per day). (Patient not taking: Reported on 04/23/2018) 30 tablet 0  . nitroGLYCERIN (NITROSTAT) 0.4 MG SL tablet PLACE 1 TABLET UNDER THE TONGUE EVERY 5 MINUTES AS NEEDED FOR CHEST PAIN. (Patient not taking: Reported on 01/22/2018) 10 tablet 0   Current Facility-Administered Medications  Medication Dose Route Frequency Provider Last Rate Last Dose  . 0.9 %  sodium chloride infusion  500 mL Intravenous Continuous Pyrtle, Lajuan Lines, MD       PHYSICAL EXAM: Vitals:   04/23/18 1008  BP: 138/82  Pulse: 66  SpO2: 98%  Weight: 192 lb 3.2 oz (87.2 kg)   Wt Readings from Last 3 Encounters:  04/23/18 192 lb 3.2 oz (87.2 kg)    04/15/18 189 lb (85.7 kg)  04/10/18 195 lb (88.5 kg)     General: Well appearing. No resp difficulty. HEENT: Normal Neck: Supple. JVP 5-6. Carotids 2+ bilat; no bruits. No thyromegaly or nodule noted. Cor: PMI nondisplaced. RRR, No M/G/R noted Lungs: CTAB, normal effort. Abdomen: Soft, non-tender, non-distended, no HSM. No bruits or masses. +BS  Extremities: No cyanosis, clubbing, or rash. R and LLE no edema.  Neuro: Alert & orientedx3, cranial nerves grossly intact. moves all 4 extremities w/o difficulty. Affect pleasant  ASSESSMENT & PLAN: 1. Chronic Systolic Heart Failure. NICM - 01/2016 Echo EF 35-40%.  - Echo 06/2017 EF 40-45% - EF recovered. --->ECHo 2019 EF 55%.  - Volume status stable. Does not need lasix.  - Continue coreg 3.125 mg twice a day BID - Continue entresto 24-26 mg.   - Continue imdur 30 mg daily for distant history of atypical chest pain.  - Continue spiro 25 mg daily.  2. HTN - Slightly elevated today. Typically runs 110-130s. Will not increase medications today with improvement in EF and CKD.  3. H/O syncope  - No further. Episode of near-syncope in October was isoloated and has not recurred.  - Resolved   4. H/O ETOH - Drinks 2 beers every other day 5. CKD stage III  - Creatinine stable 1.35 on 03/11/18 - Check BMET today.  6. Pulmonary Nodule   Doing well from HF perspective.  Follow up in 3 months, sooner with symptoms BMET  Georgiana Shore, NP   Greater than 50% of the 25 minute visit was spent in counseling/coordination of care regarding disease state education, salt/fluid restriction, sliding scale diuretics, and medication compliance.

## 2018-04-23 ENCOUNTER — Ambulatory Visit (HOSPITAL_COMMUNITY)
Admission: RE | Admit: 2018-04-23 | Discharge: 2018-04-23 | Disposition: A | Discharge status: Home / Self Care | Payer: Medicare Other | Source: Ambulatory Visit | Attending: Internal Medicine | Admitting: Internal Medicine

## 2018-04-23 ENCOUNTER — Encounter (HOSPITAL_COMMUNITY): Payer: Self-pay

## 2018-04-23 VITALS — BP 138/82 | HR 66 | Wt 192.2 lb

## 2018-04-23 DIAGNOSIS — R911 Solitary pulmonary nodule: Secondary | ICD-10-CM | POA: Insufficient documentation

## 2018-04-23 DIAGNOSIS — Z79899 Other long term (current) drug therapy: Secondary | ICD-10-CM | POA: Diagnosis not present

## 2018-04-23 DIAGNOSIS — R0789 Other chest pain: Secondary | ICD-10-CM | POA: Insufficient documentation

## 2018-04-23 DIAGNOSIS — Z7982 Long term (current) use of aspirin: Secondary | ICD-10-CM | POA: Diagnosis not present

## 2018-04-23 DIAGNOSIS — I428 Other cardiomyopathies: Secondary | ICD-10-CM | POA: Insufficient documentation

## 2018-04-23 DIAGNOSIS — I5042 Chronic combined systolic (congestive) and diastolic (congestive) heart failure: Secondary | ICD-10-CM | POA: Diagnosis present

## 2018-04-23 DIAGNOSIS — N183 Chronic kidney disease, stage 3 unspecified: Secondary | ICD-10-CM

## 2018-04-23 DIAGNOSIS — L409 Psoriasis, unspecified: Secondary | ICD-10-CM | POA: Insufficient documentation

## 2018-04-23 DIAGNOSIS — I5082 Biventricular heart failure: Secondary | ICD-10-CM | POA: Insufficient documentation

## 2018-04-23 DIAGNOSIS — I159 Secondary hypertension, unspecified: Secondary | ICD-10-CM | POA: Diagnosis not present

## 2018-04-23 DIAGNOSIS — I130 Hypertensive heart and chronic kidney disease with heart failure and stage 1 through stage 4 chronic kidney disease, or unspecified chronic kidney disease: Secondary | ICD-10-CM | POA: Diagnosis not present

## 2018-04-23 DIAGNOSIS — I5022 Chronic systolic (congestive) heart failure: Secondary | ICD-10-CM | POA: Insufficient documentation

## 2018-04-23 DIAGNOSIS — F1010 Alcohol abuse, uncomplicated: Secondary | ICD-10-CM

## 2018-04-23 LAB — BASIC METABOLIC PANEL
ANION GAP: 8 (ref 5–15)
BUN: 10 mg/dL (ref 8–23)
CHLORIDE: 106 mmol/L (ref 98–111)
CO2: 27 mmol/L (ref 22–32)
Calcium: 8.6 mg/dL — ABNORMAL LOW (ref 8.9–10.3)
Creatinine, Ser: 1.24 mg/dL (ref 0.61–1.24)
GFR calc Af Amer: >60 mL/min (ref >60)
GFR, EST NON AFRICAN AMERICAN: 60 mL/min — AB (ref >60)
GLUCOSE: 93 mg/dL (ref 70–99)
POTASSIUM: 4.1 mmol/L (ref 3.5–5.1)
Sodium: 141 mmol/L (ref 135–145)

## 2018-04-23 NOTE — Patient Instructions (Signed)
Routine lab work today. Will notify you of abnormal results, otherwise no news is good news!  Follow up 3 months.  _________________________________________________________________________ [name] Code: 1937  Take all medication as prescribed the day of your appointment. Bring all medications with you to your appointment.  Do the following things EVERYDAY: 1) Weigh yourself in the morning before breakfast. Write it down and keep it in a log. 2) Take your medicines as prescribed 3) Eat low salt foods-Limit salt (sodium) to 2000 mg per day.  4) Stay as active as you can everyday 5) Limit all fluids for the day to less than 2 liters

## 2018-04-26 ENCOUNTER — Encounter: Payer: Self-pay | Admitting: Student in an Organized Health Care Education/Training Program

## 2018-04-29 ENCOUNTER — Other Ambulatory Visit: Payer: Self-pay | Admitting: Family Medicine

## 2018-04-29 ENCOUNTER — Other Ambulatory Visit: Payer: Self-pay | Admitting: Internal Medicine

## 2018-04-29 DIAGNOSIS — L309 Dermatitis, unspecified: Secondary | ICD-10-CM

## 2018-04-29 MED FILL — CARVEDILOL 3.125 MG TABLET: 3.125 | 30 days supply | Qty: 60 | Fill #0

## 2018-04-29 MED FILL — ENTRESTO 24 MG-26 MG TABLET: 24-26 | 30 days supply | Qty: 60 | Fill #0

## 2018-04-29 MED FILL — SPIRONOLACTONE 25 MG TABLET: 25 | 30 days supply | Qty: 15 | Fill #0

## 2018-05-01 ENCOUNTER — Other Ambulatory Visit: Payer: Self-pay | Admitting: Internal Medicine

## 2018-05-01 DIAGNOSIS — L309 Dermatitis, unspecified: Secondary | ICD-10-CM

## 2018-05-01 NOTE — Telephone Encounter (Signed)
Patient calling to inquire why his psoriasis cream was denied? He uses this frequently.   Call back is [phone]  Danley Danker, RN [facility] [place])

## 2018-05-03 ENCOUNTER — Other Ambulatory Visit: Payer: Self-pay | Admitting: Student in an Organized Health Care Education/Training Program

## 2018-05-03 DIAGNOSIS — L309 Dermatitis, unspecified: Secondary | ICD-10-CM

## 2018-05-03 MED ORDER — TRIAMCINOLONE ACETONIDE 0.5 % EX CREA: TOPICAL_CREAM | CUTANEOUS | 3 refills | Status: DC

## 2018-05-04 MED FILL — TRIAMCINOLONE 0.5% CREAM: 0.5 | 15 days supply | Qty: 30 | Fill #0

## 2018-05-04 NOTE — Telephone Encounter (Signed)
To you 

## 2018-05-05 NOTE — Telephone Encounter (Signed)
Pt informed by pharmacy. Lawsen Arnott, Salome Spotted, CMA

## 2018-06-03 MED FILL — SPIRONOLACTONE 25 MG TABLET: 25 | 30 days supply | Qty: 15 | Fill #1

## 2018-06-03 MED FILL — ENTRESTO 24 MG-26 MG TABLET: 24-26 | 30 days supply | Qty: 60 | Fill #1

## 2018-06-03 MED FILL — TRIAMCINOLONE 0.5% CREAM: 0.5 | 15 days supply | Qty: 30 | Fill #1

## 2018-06-03 MED FILL — CARVEDILOL 3.125 MG TABLET: 3.125 | 30 days supply | Qty: 60 | Fill #1

## 2018-07-02 MED FILL — TRIAMCINOLONE 0.5% CREAM: 0.5 | 15 days supply | Qty: 30 | Fill #2

## 2018-07-16 ENCOUNTER — Ambulatory Visit (INDEPENDENT_AMBULATORY_CARE_PROVIDER_SITE_OTHER): Payer: Medicare Other | Admitting: Student in an Organized Health Care Education/Training Program

## 2018-07-16 ENCOUNTER — Encounter: Payer: Self-pay | Admitting: Student in an Organized Health Care Education/Training Program

## 2018-07-16 VITALS — BP 130/72 | HR 74 | Temp 98.4°F | Ht 72.0 in | Wt 188.2 lb

## 2018-07-16 DIAGNOSIS — N183 Chronic kidney disease, stage 3 unspecified: Secondary | ICD-10-CM

## 2018-07-16 DIAGNOSIS — I10 Essential (primary) hypertension: Secondary | ICD-10-CM

## 2018-07-16 DIAGNOSIS — R9430 Abnormal result of cardiovascular function study, unspecified: Secondary | ICD-10-CM | POA: Diagnosis not present

## 2018-07-16 DIAGNOSIS — Z23 Encounter for immunization: Secondary | ICD-10-CM

## 2018-07-16 NOTE — Assessment & Plan Note (Signed)
Well controlled. Patient is on carvedilol and spironolactone 130/72 today - recheck at next appointment

## 2018-07-16 NOTE — Progress Notes (Signed)
Subjective:    Patient ID: [name], male    DOB: Sep 15, 1956, 62 y.o.   MRN: [id number]   CC: regular follow up for basic lab work and flu shot  HPI: patient states he feels real good. He is very active and has changed his diet to be more healthy. He also stopped drinking beer.  He had some left leg swelling starting September 2nd after mowing the lawn. He does not recall any bug bites or injuries. The swelling lasted for about a month and was only painful for the last weekend. Swelling is practically resolved now at time of appointment. Patient states that it was not itchy and had no drainage. He denies any redness, warmth at the site, or any difficulties with breathing. The area he indicated was not over a joint but instead on the medial calf/shin. He had a similar occurrence many years ago in the other leg and was treated with steroids which improved his symptoms. Since his swelling is resolved at time of appointment, I will not intervene but asked him to call or come back if it worsens. Will recheck his BMP at this appointment to monitor kidney function and potassium level due to being on medications that are known to cause potassium imbalance.  He has no other concerns or questions at this time.  Smoking status reviewed   ROS: pertinent noted in the HPI   Past Medical History:  Diagnosis Date  . Alcohol abuse    still drink beer  . CHF (congestive heart failure) ([place])   . Chronic combined systolic and diastolic heart failure, NYHA class 3 ([place]) 06/02/2012  . Chronic systolic heart failure ([place]) 06/02/2012  . Hypertension   . Need for prophylactic vaccination and inoculation against single disease 04/15/2018  . NICM (nonischemic cardiomyopathy) ([place]) 09/04/2012  . Psoriasis    Possible psoriasis in the past  . Pulmonary nodule, right 11/03/2012    Past Surgical History:  Procedure Laterality Date  . LEFT AND RIGHT HEART CATHETERIZATION WITH CORONARY ANGIOGRAM N/A 05/25/2012   Procedure: LEFT AND RIGHT HEART CATHETERIZATION WITH CORONARY ANGIOGRAM;  Surgeon: Jolaine Artist, MD;  Location: [facility] CATH LAB;  Service: Cardiovascular;  Laterality: N/A;  . SKIN GRAFT     L arm got caught in machine in 1979    Past medical history, surgical, family, and social history reviewed and updated in the EMR as appropriate.  Objective:  BP 130/72   Pulse 74   Temp 98.4 F (36.9 C) (Oral)   Ht 6' (1.829 m)   Wt 188 lb 3.2 oz (85.4 kg)   SpO2 96%   BMI 25.52 kg/m   Vitals and nursing note reviewed  General: NAD, pleasant, able to participate in exam Cardiac: RRR, S1 S2 present. normal heart sounds, no murmurs. Respiratory: CTAB, normal effort, No wheezes, rales or rhonchi Extremities: no edema or cyanosis. Skin: warm and dry, no rashes noted Neuro: alert, no obvious focal deficits Psych: Normal affect and mood   Assessment & Plan:    Hypertension Well controlled. Patient is on carvedilol and spironolactone 130/72 today - recheck at next appointment  CKD (chronic kidney disease), stage III ([place]) Cr 1.24 and GFR 60 at last visit - recheck BMP this visit - follow up in 6 months if normal  Cardiac LV ejection fraction >40% Stable. Did have episode of unilateral leg swelling for about a month that spontaneously resolved. No difficulty breathing. Not suspecting it was related to heart function. Patient follows with  cardiology and next appointment is next week. - recheck at next appointment.    Doristine Mango, [facility] PGY-1

## 2018-07-16 NOTE — Assessment & Plan Note (Signed)
Stable. Did have episode of unilateral leg swelling for about a month that spontaneously resolved. No difficulty breathing. Not suspecting it was related to heart function. Patient follows with cardiology and next appointment is next week. - recheck at next appointment.

## 2018-07-16 NOTE — Patient Instructions (Addendum)
It was a pleasure to see you today!  To summarize our discussion for this visit:  BP check  Kidney function check  Some additional health maintenance measures we should update are: . Flu shot  Please return to our clinic to see me in 6 months for regular follow up and basic lab work.  Call the clinic at [phone] if your symptoms worsen or you have any concerns.  Thank you for allowing me to take part in your care,  Dr. Doristine Mango   Thanks for choosing [facility] for your primary care.

## 2018-07-16 NOTE — Assessment & Plan Note (Signed)
Cr 1.24 and GFR 60 at last visit - recheck BMP this visit - follow up in 6 months if normal

## 2018-07-17 ENCOUNTER — Encounter: Payer: Self-pay | Admitting: Student in an Organized Health Care Education/Training Program

## 2018-07-17 LAB — BASIC METABOLIC PANEL
BUN/Creatinine Ratio: 14 (ref 10–24)
BUN: 18 mg/dL (ref 8–27)
CALCIUM: 9.9 mg/dL (ref 8.6–10.2)
CO2: 24 mmol/L (ref 20–29)
CREATININE: 1.26 mg/dL (ref 0.76–1.27)
Chloride: 102 mmol/L (ref 96–106)
GFR calc Af Amer: 69 mL/min/{1.73_m2} (ref >59)
GFR, EST NON AFRICAN AMERICAN: 60 mL/min/{1.73_m2} (ref >59)
Glucose: 91 mg/dL (ref 65–99)
Potassium: 5.1 mmol/L (ref 3.5–5.2)
Sodium: 142 mmol/L (ref 134–144)

## 2018-07-23 ENCOUNTER — Encounter (HOSPITAL_COMMUNITY): Payer: Self-pay

## 2018-07-23 ENCOUNTER — Ambulatory Visit (HOSPITAL_COMMUNITY)
Admission: RE | Admit: 2018-07-23 | Discharge: 2018-07-23 | Disposition: A | Discharge status: Home / Self Care | Payer: Medicare Other | Source: Ambulatory Visit | Attending: Internal Medicine | Admitting: Internal Medicine

## 2018-07-23 VITALS — BP 166/88 | HR 66 | Wt 192.6 lb

## 2018-07-23 DIAGNOSIS — N183 Chronic kidney disease, stage 3 unspecified: Secondary | ICD-10-CM

## 2018-07-23 DIAGNOSIS — I5022 Chronic systolic (congestive) heart failure: Secondary | ICD-10-CM | POA: Diagnosis not present

## 2018-07-23 DIAGNOSIS — I5082 Biventricular heart failure: Secondary | ICD-10-CM | POA: Diagnosis not present

## 2018-07-23 DIAGNOSIS — F1010 Alcohol abuse, uncomplicated: Secondary | ICD-10-CM | POA: Diagnosis not present

## 2018-07-23 DIAGNOSIS — I5042 Chronic combined systolic (congestive) and diastolic (congestive) heart failure: Secondary | ICD-10-CM | POA: Insufficient documentation

## 2018-07-23 DIAGNOSIS — R911 Solitary pulmonary nodule: Secondary | ICD-10-CM | POA: Diagnosis not present

## 2018-07-23 DIAGNOSIS — I130 Hypertensive heart and chronic kidney disease with heart failure and stage 1 through stage 4 chronic kidney disease, or unspecified chronic kidney disease: Secondary | ICD-10-CM | POA: Diagnosis not present

## 2018-07-23 DIAGNOSIS — I159 Secondary hypertension, unspecified: Secondary | ICD-10-CM | POA: Diagnosis not present

## 2018-07-23 DIAGNOSIS — R55 Syncope and collapse: Secondary | ICD-10-CM | POA: Insufficient documentation

## 2018-07-23 DIAGNOSIS — Z79899 Other long term (current) drug therapy: Secondary | ICD-10-CM | POA: Insufficient documentation

## 2018-07-23 DIAGNOSIS — Z7982 Long term (current) use of aspirin: Secondary | ICD-10-CM | POA: Diagnosis not present

## 2018-07-23 DIAGNOSIS — I440 Atrioventricular block, first degree: Secondary | ICD-10-CM | POA: Insufficient documentation

## 2018-07-23 DIAGNOSIS — I428 Other cardiomyopathies: Secondary | ICD-10-CM | POA: Diagnosis not present

## 2018-07-23 MED ORDER — AMLODIPINE BESYLATE 2.5 MG PO TABS: 2.5000 mg | ORAL_TABLET | Freq: Every day | ORAL | 11 refills | Status: DC

## 2018-07-23 MED FILL — AMLODIPINE 2.5 MG TABLET: 2.5 | 30 days supply | Qty: 30 | Fill #0

## 2018-07-23 NOTE — Progress Notes (Signed)
Pt completed SDOH screening in clinic- no concerns identified at this time  CSW will continue to follow in clinic and assist as needed  Jorge Ny, [place] Worker [facility] [phone]

## 2018-07-23 NOTE — Patient Instructions (Addendum)
Today you have been seen at the Heart failure clinic at [facility]   Medication changes: Amlodipine 2.5mg  daily  Today you had an EKG  Follow up with Dr. Haroldine Laws In 6 months we will call you to schedule appointment, You have a nurse visit scheduled in 2 weeks to have you blood pressure checked  08/06/18 at 9am  Do the following things EVERYDAY: 1) Weigh yourself in the morning before breakfast. Write it down and keep it in a log. 2) Take your medicines as prescribed 3) Eat low salt foods-Limit salt (sodium) to 2000 mg per day.  4) Stay as active as you can everyday 5) Limit all fluids for the day to less than 2 liters

## 2018-07-23 NOTE — Progress Notes (Signed)
ADVANCED HF CLINIC NOTE  Patient ID: [name], male   DOB: 03-Sep-1956, 62 y.o.   MRN: [id number] PCP: [place].  HF MD: Dr Haroldine Laws   HPI: [name] is a 63 y.o. gentlemen with history of HTN, daily EtOH and previous diagnosis of biventricular heart failure with EF 15% with recovered EF. He has chronic atypical chest pain.   Admitted 4/10 through 412/2019. Treated for AKI and hypotension. Diuretics held.   Today he returns for HF follow up. Overall feeling fine. Denies SOB/PND/Orthopnea. Appetite ok. No fever or chills. Weight at home 191-192  Pounds. Rarely drinks alcohol.  Taking all medications  05/25/12 RHC/LHC  RA = 7  RV = 56/3/10  PA = 52/23 (34)  PCW = 17 v= 25  Fick cardiac output/index = 7.8/3.9  Thermo CO/CI = 6.1/3.0  PVR = 2.1 Woods  SVR = 1194  FA sat = 97%  PA sat = 79%, 80%  High SVC = 79%  EF 15%  Coronaries normal.  CPX 07/14/12: Peak VO2: 21.70ml/kg/min  % predicted peak VO2: 65.1%, VE/VCO2 slope: 25.0, OUES: 2.27, Peak RER: 1.12, Ventilatory Threshold: 12.4 % predicted peak VO2: 38.3%.    04/2012 Echo: LVEF 15%, diffuse hypokinesis. Grade 2 diastolic dysfunction. LA mildly dilated. RV moderately reduced. RA mildly dilated. PAPP 44 mmHg.  04/2012 Abd u/s: Small volume abdominal ascites. Cannot exclude mild cirrhosis.  04/2012 VQ scan: Very low probability for pulmonary embolism.  Echo 08/06/12: LVEF 89%, grade 1 diastolic dysfunction.  RV recovered.  Echo 7/14: LVEF 50-55% Even monitor: lots of transmitted episodes. Tracing all NSR with no ectopy. 01/2016 EF 35-40%.   07/2016: EF 35-40%. Echo 06/2017 EF ~ 50% Echo 01/08/18: EF 55% with Mod LVH, Grade 1 DD, and RV mildly reduced Review of systems complete and found to be negative unless listed in HPI.    Past Medical History:  Diagnosis Date  . Alcohol abuse    still drink beer  . CHF (congestive heart failure) ([place])   . Chronic combined systolic and diastolic heart failure, NYHA class 3  ([place]) 06/02/2012  . Chronic systolic heart failure ([place]) 06/02/2012  . Hypertension   . Need for prophylactic vaccination and inoculation against single disease 04/15/2018  . NICM (nonischemic cardiomyopathy) ([place]) 09/04/2012  . Psoriasis    Possible psoriasis in the past  . Pulmonary nodule, right 11/03/2012    Current Outpatient Medications  Medication Sig Dispense Refill  . aspirin 81 MG EC tablet TAKE 1 TABLET (81 MG TOTAL) BY MOUTH DAILY. 30 tablet 3  . carvedilol (COREG) 3.125 MG tablet TAKE 1 TABLET BY MOUTH 2 TIMES DAILY WITH A MEAL. 120 tablet 1  . colchicine 0.6 MG tablet Take 1 tablet (0.6 mg total) by mouth daily. Take 2 tablets 1st day (1.2mg  total). Followed by 1 tablet daily (0.6mg  total per day). 30 tablet 0  . ENTRESTO 24-26 MG TAKE 1 TABLET BY MOUTH 2 TIMES DAILY. 120 tablet 1  . nitroGLYCERIN (NITROSTAT) 0.4 MG SL tablet PLACE 1 TABLET UNDER THE TONGUE EVERY 5 MINUTES AS NEEDED FOR CHEST PAIN. 10 tablet 0  . spironolactone (ALDACTONE) 25 MG tablet Take 25 mg by mouth daily.    Marland Kitchen triamcinolone cream (KENALOG) 0.5 % APPLY TO THE AFFECTED AREA(S) TOPICALLY EVERY DAY AS NEEDED. 30 g 3  . acetaminophen (TYLENOL 8 HOUR ARTHRITIS PAIN) 650 MG CR tablet Take 1 tablet (650 mg total) by mouth every 8 (eight) hours as needed for pain. (Patient not  taking: Reported on 07/23/2018) 30 tablet 0   Current Facility-Administered Medications  Medication Dose Route Frequency Provider Last Rate Last Dose  . 0.9 %  sodium chloride infusion  500 mL Intravenous Continuous Pyrtle, Lajuan Lines, MD       PHYSICAL EXAM: Vitals:   07/23/18 1012  BP: (!) 166/88  Pulse: 66  SpO2: 98%  Weight: 87.4 kg (192 lb 9.6 oz)   Wt Readings from Last 3 Encounters:  07/23/18 87.4 kg (192 lb 9.6 oz)  07/16/18 85.4 kg (188 lb 3.2 oz)  04/23/18 87.2 kg (192 lb 3.2 oz)     General:  Well appearing. No resp difficulty HEENT: normal Neck: supple. no JVD. Carotids 2+ bilat; no bruits. No lymphadenopathy or  thryomegaly appreciated. Cor: PMI nondisplaced. Regular rate & rhythm. No rubs, gallops or murmurs. Lungs: clear Abdomen: soft, nontender, nondistended. No hepatosplenomegaly. No bruits or masses. Good bowel sounds. Extremities: no cyanosis, clubbing, rash, edema Neuro: alert & orientedx3, cranial nerves grossly intact. moves all 4 extremities w/o difficulty. Affect pleasant Skin: Hypopigmentation on hands.   EKG: SR 63 bpm 1st degree heart block 210 ms.   ASSESSMENT & PLAN: 1. Chronic Systolic Heart Failure. NICM - 01/2016 Echo EF 35-40%.  - Echo 06/2017 EF 40-45% - EF recovered. --->ECHo 2019 EF 55%.  Increase carv - Continue coreg 3.125 mg twice a day BID.  - Continue entresto 24-26 mg.   - Continue spiro 25 mg daily.  2. HTN -Elevated. Add 2.5 mg amlodipine daily.  - continue other medications.  3. H/O syncope  - resolved.  4. H/O ETOH - Rarey drinks alcohol.  5. CKD stage III  I reviewed BMET from 07/16/2018. Renal function was stable   6. Pulmonary Nodule   Follow up in 2 weeks for BP check . Follow up in 6 months. Possible d/c to general cardiology.  BMEt today.   Darrick Grinder, NP

## 2018-07-23 NOTE — Addendum Note (Signed)
Encounter addended by: Jorge Ny, LCSW on: 07/23/2018 4:37 PM  Actions taken: Visit Navigator Flowsheet section accepted, Sign clinical note

## 2018-07-31 MED FILL — TRIAMCINOLONE 0.5% CREAM: 0.5 | 15 days supply | Qty: 30 | Fill #3

## 2018-08-06 ENCOUNTER — Other Ambulatory Visit (HOSPITAL_COMMUNITY): Payer: Self-pay

## 2018-08-06 ENCOUNTER — Ambulatory Visit (HOSPITAL_COMMUNITY)
Admission: RE | Admit: 2018-08-06 | Discharge: 2018-08-06 | Disposition: A | Discharge status: Home / Self Care | Payer: Medicare Other | Source: Ambulatory Visit | Attending: Internal Medicine | Admitting: Internal Medicine

## 2018-08-06 VITALS — BP 148/88 | HR 64 | Wt 196.4 lb

## 2018-08-06 DIAGNOSIS — Z0130 Encounter for examination of blood pressure without abnormal findings: Secondary | ICD-10-CM | POA: Diagnosis not present

## 2018-08-06 MED ORDER — SPIRONOLACTONE 25 MG PO TABS: ORAL_TABLET | ORAL | 0 refills | Status: DC

## 2018-08-06 MED ORDER — AMLODIPINE BESYLATE 5 MG PO TABS: 5.0000 mg | ORAL_TABLET | Freq: Every day | ORAL | 0 refills | Status: DC

## 2018-08-06 NOTE — Progress Notes (Signed)
Pt came in today for a bp check. [name] changed to 12.5 mg once daily, and Amlodipine 5 mg once daily. Bp: 148/88.

## 2018-08-31 ENCOUNTER — Other Ambulatory Visit: Payer: Self-pay | Admitting: Student in an Organized Health Care Education/Training Program

## 2018-08-31 DIAGNOSIS — L309 Dermatitis, unspecified: Secondary | ICD-10-CM

## 2018-08-31 MED FILL — SPIRONOLACTONE 25 MG TABLET: 25 | 30 days supply | Qty: 15 | Fill #2

## 2018-08-31 MED FILL — AMLODIPINE BESYLATE 5 MG TA: 5 | 90 days supply | Qty: 90 | Fill #0

## 2018-08-31 MED FILL — CARVEDILOL 3.125 MG TABLET: 3.125 | 30 days supply | Qty: 60 | Fill #2

## 2018-09-01 MED FILL — TRIAMCINOLONE 0.5% CREAM: 0.5 | 15 days supply | Qty: 30 | Fill #0

## 2018-10-02 MED FILL — TRIAMCINOLONE 0.5% CREAM: 0.5 | 15 days supply | Qty: 30 | Fill #1

## 2018-12-11 ENCOUNTER — Ambulatory Visit (INDEPENDENT_AMBULATORY_CARE_PROVIDER_SITE_OTHER): Payer: Medicare Other

## 2018-12-11 ENCOUNTER — Other Ambulatory Visit: Payer: Self-pay

## 2018-12-11 VITALS — BP 142/80 | HR 67 | Temp 98.3°F | Ht 70.0 in | Wt 202.6 lb

## 2018-12-11 DIAGNOSIS — Z0000 Encounter for general adult medical examination without abnormal findings: Secondary | ICD-10-CM | POA: Diagnosis not present

## 2018-12-11 NOTE — Progress Notes (Signed)
Subjective:   [name] is a 63 y.o. male who presents for Medicare Annual/Subsequent preventive examination.  Review of Systems:  Physical assessment deferred to PCP.  Cardiac Risk Factors include: advanced age (>44men, >72 women);hypertension;male gender;sedentary lifestyle     Objective:    Vitals: BP (!) 142/80   Pulse 67   Temp 98.3 F (36.8 C) (Oral)   Ht 5\' 10"  (1.778 m)   Wt 202 lb 9.6 oz (91.9 kg)   SpO2 95%   BMI 29.07 kg/m   Body mass index is 29.07 kg/m.  Advanced Directives 12/11/2018 07/16/2018 04/15/2018 03/20/2018 01/15/2018 01/08/2018 07/22/2017  Does Patient Have a Medical Advance Directive? No No No No No No No  Would patient like information on creating a medical advance directive? Yes (MAU/Ambulatory/Procedural Areas - Information given) No - Patient declined No - Patient declined No - Patient declined No - Patient declined No - Patient declined No - Patient declined  Pre-existing out of facility DNR order (yellow form or pink MOST form) - - - - - - -    Tobacco Social History   Tobacco Use  Smoking Status Former Smoker  . Years: 3.00  . Types: Cigars  . Last attempt to quit: 01/01/2012  . Years since quitting: 6.9  Smokeless Tobacco Never Used     Counseling given: No   Clinical Intake:  Pre-visit preparation completed: Yes  Pain : No/denies pain Pain Score: 0-No pain     Nutritional Status: BMI 25 -29 Overweight Nutritional Risks: None Diabetes: No  How often do you need to have someone help you when you read instructions, pamphlets, or other written materials from your doctor or pharmacy?: 1 - Never What is the last grade level you completed in school?: 12th grade; some college  Interpreter Needed?: No     Past Medical History:  Diagnosis Date  . Alcohol abuse    still drink beer  . CHF (congestive heart failure) ([place])   . Chronic combined systolic and diastolic heart failure, NYHA class 3 ([place]) 06/02/2012  . Chronic  systolic heart failure ([place]) 06/02/2012  . Hypertension   . Need for prophylactic vaccination and inoculation against single disease 04/15/2018  . NICM (nonischemic cardiomyopathy) ([place]) 09/04/2012  . Psoriasis    Possible psoriasis in the past  . Pulmonary nodule, right 11/03/2012   Past Surgical History:  Procedure Laterality Date  . LEFT AND RIGHT HEART CATHETERIZATION WITH CORONARY ANGIOGRAM N/A 05/25/2012   Procedure: LEFT AND RIGHT HEART CATHETERIZATION WITH CORONARY ANGIOGRAM;  Surgeon: Jolaine Artist, MD;  Location: [facility] CATH LAB;  Service: Cardiovascular;  Laterality: N/A;  . SKIN GRAFT     L arm got caught in machine in 1979   Family History  Problem Relation Age of Onset  . Heart failure Father   . Heart failure Other        Uncle. Possible MI?  Marland Kitchen Cancer Sister        Skin  . Cancer Brother   . Prostate cancer Brother   . Colon polyps Brother   . Colon polyps Brother   . Prostate cancer Brother   . Colon cancer Neg Hx   . Esophageal cancer Neg Hx   . Rectal cancer Neg Hx   . Stomach cancer Neg Hx    Social History   Socioeconomic History  . Marital status: Legally Separated    Spouse name: Not on file  . Number of children: 7  . Years of education:  12  . Highest education level: 12th grade  Occupational History  . Not on file  Social Needs  . Financial resource strain: Not hard at all  . Food insecurity:    Worry: Never true    Inability: Never true  . Transportation needs:    Medical: No    Non-medical: No  Tobacco Use  . Smoking status: Former Smoker    Years: 3.00    Types: Cigars    Last attempt to quit: 01/01/2012    Years since quitting: 6.9  . Smokeless tobacco: Never Used  Substance and Sexual Activity  . Alcohol use: Yes    Alcohol/week: 0.0 standard drinks    Comment: 2-3 beers 3-4 days per week  . Drug use: No  . Sexual activity: Not Currently  Lifestyle  . Physical activity:    Days per week: Not on file    Minutes per session: Not  on file  . Stress: Not at all  Relationships  . Social connections:    Talks on phone: More than three times a week    Gets together: Once a week    Attends religious service: More than 4 times per year    Active member of club or organization: Yes    Attends meetings of clubs or organizations: 1 to 4 times per year    Relationship status: Separated  Other Topics Concern  . Not on file  Social History Narrative   Retired/disabled from trucking.   Lives in home but one son and girlfriend with 4 granddaughters live with him "temporarily".   No pets except goldfish.      Mobile home with some steps, has handrails. Non-working smoke alarms.    Likes to eat meat, pasta, vegetables, and fruit. Sweet tea is what he likes to drink, V8 juice.      Loves to sing, sudoku puzzles, facebook.      Wears seat belt in car.    Outpatient Encounter Medications as of 12/11/2018  Medication Sig  . acetaminophen (TYLENOL 8 HOUR ARTHRITIS PAIN) 650 MG CR tablet Take 1 tablet (650 mg total) by mouth every 8 (eight) hours as needed for pain.  Marland Kitchen amLODipine (NORVASC) 5 MG tablet Take 1 tablet (5 mg total) by mouth daily.  . carvedilol (COREG) 3.125 MG tablet TAKE 1 TABLET BY MOUTH 2 TIMES DAILY WITH A MEAL.  Marland Kitchen colchicine 0.6 MG tablet Take 1 tablet (0.6 mg total) by mouth daily. Take 2 tablets 1st day (1.2mg  total). Followed by 1 tablet daily (0.6mg  total per day).  . nitroGLYCERIN (NITROSTAT) 0.4 MG SL tablet PLACE 1 TABLET UNDER THE TONGUE EVERY 5 MINUTES AS NEEDED FOR CHEST PAIN.  Marland Kitchen spironolactone (ALDACTONE) 25 MG tablet Take 1/2 tab (12.5 mg) once daily  . triamcinolone cream (KENALOG) 0.5 % APPLY TO THE AFFECTED AREA(S) TOPICALLY EVERY DAY AS NEEDED.  Marland Kitchen aspirin 81 MG EC tablet TAKE 1 TABLET (81 MG TOTAL) BY MOUTH DAILY. (Patient not taking: Reported on 12/11/2018)  . ENTRESTO 24-26 MG TAKE 1 TABLET BY MOUTH 2 TIMES DAILY. (Patient not taking: Reported on 12/11/2018)   Facility-Administered Encounter  Medications as of 12/11/2018  Medication  . 0.9 %  sodium chloride infusion    Activities of Daily Living In your present state of health, do you have any difficulty performing the following activities: 12/11/2018 01/08/2018  Hearing? N N  Vision? N N  Difficulty concentrating or making decisions? N N  Walking or climbing stairs? N N  Dressing or bathing? N N  Doing errands, shopping? N N  Preparing Food and eating ? N -  Using the Toilet? N -  In the past six months, have you accidently leaked urine? N -  Do you have problems with loss of bowel control? N -  Managing your Medications? N -  Managing your Finances? N -  Housekeeping or managing your Housekeeping? N -  Some recent data might be hidden    Patient Care Team: Richarda Osmond, DO as PCP - General Bensimhon, Shaune Pascal, MD as Consulting Physician (Cardiology)   Assessment:   This is a routine wellness examination for [facility].  Exercise Activities and Dietary recommendations Current Exercise Habits: The patient does not participate in regular exercise at present, Exercise limited by: Other - see comments  Goals      Patient Stated   . Have colonoscopy (pt-stated)      Other   . Patient Stated     Maintain current health; maintain weight       Fall Risk Fall Risk  12/11/2018 07/16/2018 01/15/2018 12/31/2016 12/16/2016  Falls in the past year? 0 No No No No   Is the patient's home free of loose throw rugs in walkways, pet beds, electrical cords, etc?   yes      Grab bars in the bathroom? no      Handrails on the stairs?   yes      Adequate lighting?   yes   Depression Screen PHQ 2/9 Scores 12/11/2018 07/16/2018 04/15/2018 03/20/2018  PHQ - 2 Score 0 0 0 0  PHQ- 9 Score - - - -    Cognitive Function MMSE - Mini Mental State Exam 12/11/2018  Orientation to time 5  Orientation to Place 5  Registration 3  Attention/ Calculation 5  Recall 3  Language- name 2 objects 2  Language- repeat 1  Language- follow 3  step command 3  Language- read & follow direction 1  Write a sentence 1  Copy design 1  Total score 30     6CIT Screen 12/11/2018  What Year? 0 points  What month? 0 points  What time? 0 points  Count back from 20 0 points  Months in reverse 0 points  Repeat phrase 0 points  Total Score 0    Immunization History  Administered Date(s) Administered  . Influenza,inj,Quad PF,6+ Mos 07/22/2017, 07/16/2018     Screening Tests Health Maintenance  Topic Date Due  . COLONOSCOPY  04/10/2021  . TETANUS/TDAP  04/15/2028  . INFLUENZA VACCINE  Completed  . Hepatitis C Screening  Completed  . HIV Screening  Completed   Cancer Screenings: Lung: Low Dose CT Chest recommended if Age 45-80 years, 30 pack-year currently smoking OR have quit w/in 15years. Patient does qualify. Colorectal: up to date  Additional Screenings:  Hepatitis C Screening: complete      Plan:  All health maintenance up to date. F/U appointment made with PCP in April for HTN and to discuss Entresto.   I have personally reviewed and noted the following in the patient's chart:   . Medical and social history . Use of alcohol, tobacco or illicit drugs  . Current medications and supplements . Functional ability and status . Nutritional status . Physical activity . Advanced directives . List of other physicians . Hospitalizations, surgeries, and ER visits in previous 12 months . Vitals . Screenings to include cognitive, depression, and falls . Referrals and appointments  In addition, I have  reviewed and discussed with patient certain preventive protocols, quality metrics, and best practice recommendations. A written personalized care plan for preventive services as well as general preventive health recommendations were provided to patient.     Esau Grew, RN  12/11/2018

## 2018-12-11 NOTE — Patient Instructions (Addendum)
[name] , Thank you for taking time to come for your Medicare Wellness Visit. I appreciate your ongoing commitment to your health goals. Please review the following plan we discussed and let me know if I can assist you in the future.   Please keep your appointment with Dr. Ouida Sills on 01/28/19.  These are the goals we discussed: Goals    . Patient Stated     Maintain current health; maintain weight       This is a list of the screening recommended for you and due dates:  Health Maintenance  Topic Date Due  . Colon Cancer Screening  04/10/2021  . Tetanus Vaccine  04/15/2028  . Flu Shot  Completed  .  Hepatitis C: One time screening is recommended by Center for Disease Control  (CDC) for  adults born from [birth year] through 1967.   Completed  . HIV Screening  Completed    Fall Prevention in the Home, Adult Falls can cause injuries. They can happen to people of all ages. There are many things you can do to make your home safe and to help prevent falls. Ask for help when making these changes, if needed. What actions can I take to prevent falls? General Instructions  Use good lighting in all rooms. Replace any light bulbs that burn out.  Turn on the lights when you go into a dark area. Use night-lights.  Keep items that you use often in easy-to-reach places. Lower the shelves around your home if necessary.  Set up your furniture so you have a clear path. Avoid moving your furniture around.  Do not have throw rugs and other things on the floor that can make you trip.  Avoid walking on wet floors.  If any of your floors are uneven, fix them.  Add color or contrast paint or tape to clearly mark and help you see: ? Any grab bars or handrails. ? First and last steps of stairways. ? Where the edge of each step is.  If you use a stepladder: ? Make sure that it is fully opened. Do not climb a closed stepladder. ? Make sure that both sides of the stepladder are locked into place. ?  Ask someone to hold the stepladder for you while you use it.  If there are any pets around you, be aware of where they are. What can I do in the bathroom?      Keep the floor dry. Clean up any water that spills onto the floor as soon as it happens.  Remove soap buildup in the tub or shower regularly.  Use non-skid mats or decals on the floor of the tub or shower.  Attach bath mats securely with double-sided, non-slip rug tape.  If you need to sit down in the shower, use a plastic, non-slip stool.  Install grab bars by the toilet and in the tub and shower. Do not use towel bars as grab bars. What can I do in the bedroom?  Make sure that you have a light by your bed that is easy to reach.  Do not use any sheets or blankets that are too big for your bed. They should not hang down onto the floor.  Have a firm chair that has side arms. You can use this for support while you get dressed. What can I do in the kitchen?  Clean up any spills right away.  If you need to reach something above you, use a strong step stool that has  a grab bar.  Keep electrical cords out of the way.  Do not use floor polish or wax that makes floors slippery. If you must use wax, use non-skid floor wax. What can I do with my stairs?  Do not leave any items on the stairs.  Make sure that you have a light switch at the top of the stairs and the bottom of the stairs. If you do not have them, ask someone to add them for you.  Make sure that there are handrails on both sides of the stairs, and use them. Fix handrails that are broken or loose. Make sure that handrails are as long as the stairways.  Install non-slip stair treads on all stairs in your home.  Avoid having throw rugs at the top or bottom of the stairs. If you do have throw rugs, attach them to the floor with carpet tape.  Choose a carpet that does not hide the edge of the steps on the stairway.  Check any carpeting to make sure that it is  firmly attached to the stairs. Fix any carpet that is loose or worn. What can I do on the outside of my home?  Use bright outdoor lighting.  Regularly fix the edges of walkways and driveways and fix any cracks.  Remove anything that might make you trip as you walk through a door, such as a raised step or threshold.  Trim any bushes or trees on the path to your home.  Regularly check to see if handrails are loose or broken. Make sure that both sides of any steps have handrails.  Install guardrails along the edges of any raised decks and porches.  Clear walking paths of anything that might make someone trip, such as tools or rocks.  Have any leaves, snow, or ice cleared regularly.  Use sand or salt on walking paths during winter.  Clean up any spills in your garage right away. This includes grease or oil spills. What other actions can I take?  Wear shoes that: ? Have a low heel. Do not wear high heels. ? Have rubber bottoms. ? Are comfortable and fit you well. ? Are closed at the toe. Do not wear open-toe sandals.  Use tools that help you move around (mobility aids) if they are needed. These include: ? Canes. ? Walkers. ? Scooters. ? Crutches.  Review your medicines with your doctor. Some medicines can make you feel dizzy. This can increase your chance of falling. Ask your doctor what other things you can do to help prevent falls. Where to find more information  Centers for Disease Control and Prevention, STEADI: https://garcia.biz/  [employer] on Aging: BrainJudge.co.uk Contact a doctor if:  You are afraid of falling at home.  You feel weak, drowsy, or dizzy at home.  You fall at home. Summary  There are many simple things that you can do to make your home safe and to help prevent falls.  Ways to make your home safe include removing tripping hazards and installing grab bars in the bathroom.  Ask for help when making these changes in your home.  This information is not intended to replace advice given to you by your health care provider. Make sure you discuss any questions you have with your health care provider. Document Released: 07/13/2009 Document Revised: 05/01/2017 Document Reviewed: 05/01/2017 Elsevier Interactive Patient Education  2019 [employer].

## 2018-12-14 MED FILL — TRIAMCINOLONE 0.5% CREAM: 0.5 | 15 days supply | Qty: 30 | Fill #2 | Status: TO

## 2019-01-11 ENCOUNTER — Other Ambulatory Visit (HOSPITAL_COMMUNITY): Payer: Self-pay

## 2019-01-11 MED ORDER — AMLODIPINE BESYLATE 5 MG PO TABS: 5.0000 mg | ORAL_TABLET | Freq: Every day | ORAL | 2 refills | Status: DC

## 2019-01-11 MED FILL — TRIAMCINOLONE ACETONIDE 0.5: 0.5 | 15 days supply | Qty: 30 | Fill #0

## 2019-01-11 MED FILL — SPIRONOLACTONE 25 MG TABS: 25 | 30 days supply | Qty: 15 | Fill #0 | Status: TO

## 2019-01-11 MED FILL — CARVEDILOL 3.125 MG TABLET: 3.125 | 30 days supply | Qty: 60 | Fill #0

## 2019-01-11 MED FILL — AMLODIPINE BESYLATE 5 MG TA: 5 | 90 days supply | Qty: 90 | Fill #0

## 2019-01-28 ENCOUNTER — Ambulatory Visit (INDEPENDENT_AMBULATORY_CARE_PROVIDER_SITE_OTHER): Payer: Medicare Other | Admitting: Student in an Organized Health Care Education/Training Program

## 2019-01-28 ENCOUNTER — Ambulatory Visit: Payer: Medicare Other | Admitting: Student in an Organized Health Care Education/Training Program

## 2019-01-28 ENCOUNTER — Other Ambulatory Visit: Payer: Self-pay

## 2019-01-28 VITALS — BP 148/88 | HR 73 | Temp 98.6°F | Ht 70.0 in | Wt 198.8 lb

## 2019-01-28 DIAGNOSIS — I10 Essential (primary) hypertension: Secondary | ICD-10-CM | POA: Diagnosis not present

## 2019-01-28 MED ORDER — AMLODIPINE BESYLATE 10 MG PO TABS: 10.0000 mg | ORAL_TABLET | Freq: Every day | ORAL | 0 refills | Status: DC

## 2019-01-28 NOTE — Patient Instructions (Signed)
It was a pleasure seeing you today in our clinic. Here is the treatment plan we have discussed and agreed upon together:  The dose of your Norvasc was increased.  We drew blood work at today's visit. I will call or send you a letter with these results. If you do not hear from me within the next week, please give our office a call.  Our clinic's number is [phone]. Please call with questions or concerns about what we discussed today.  Be well, Dr. Burr Medico

## 2019-01-28 NOTE — Progress Notes (Signed)
   CC: HTN  HPI: [name] is a 63 y.o. male with PMH significant for combined systolic and diastolic heart failure, CKD, hypertension who presents to [facility] today for hypertension follow-up  HYPERTENSION Disease Monitoring: Patient does not check his blood pressures at home Blood pressure range-blood pressure is elevated today was 409 systolic  Chest pain, palpitations-denies       Dyspnea-denies Medications: 5 mg norvasc, entresto 24-26 mg (not taking because too expensive, has not taken for 6 months), coreg 3.125, spironolactone 12.5 mg QD Compliance- 100% reported  Lightheadedness,Syncope- none    Edema- none  Monitoring Labs and Parameters Last A1C:  Lab Results  Component Value Date   HGBA1C 5.6 04/09/2016    Last Lipid:     Component Value Date/Time   CHOL 161 04/10/2016 0356   HDL 27 (L) 04/10/2016 0356    Last Bmet  Potassium  Date Value Ref Range Status  07/16/2018 5.1 3.5 - 5.2 mmol/L Final   Sodium  Date Value Ref Range Status  07/16/2018 142 134 - 144 mmol/L Final   Creat  Date Value Ref Range Status  03/22/2016 1.35 (H) 0.70 - 1.25 mg/dL Final    Comment:      For patients > or = 65 years of age: The upper reference limit for Creatinine is approximately 13% higher for people identified as African-American.      Creatinine, Ser  Date Value Ref Range Status  07/16/2018 1.26 0.76 - 1.27 mg/dL Final      Last BPs:  BP Readings from Last 3 Encounters:  01/28/19 (!) 148/88  12/11/18 (!) 142/80  08/06/18 (!) 148/88    Review of Symptoms:  See HPI for ROS.   CC, SH/smoking status, and VS noted.  Objective: BP (!) 148/88   Pulse 73   Temp 98.6 F (37 C) (Oral)   Ht 5\' 10"  (1.778 m)   Wt 198 lb 12.8 oz (90.2 kg)   SpO2 98%   BMI 28.52 kg/m  GEN: NAD, alert, cooperative, and pleasant. RESPIRATORY: CTA bil, Comfortable work of breathing, speaks in full sentences CV: RRR, no m/r/g, distal extremities well perfused and warm without edema  GI: Soft, nondistended SKIN: warm and dry, no rashes or lesions NEURO: II-XII grossly intact MSK: Moves 4 extremities equally PSYCH: AAOx3, appropriate affect  Assessment and plan:  Hypertension Patient not taking Entresto because it is too expensive.  Considered starting an ARB, however concerned he may develop hyperkalemia given that he is also taking spironolactone.  We will increase his dose of Norvasc to 10 mg daily.  Ordered a lipid panel for routine screening/risk stratification, last study was done in 2017 He does not take a statin  Orders Placed This Encounter  Procedures  . Lipid Panel    Meds ordered this encounter  Medications  . amLODipine (NORVASC) 10 MG tablet    Sig: Take 1 tablet (10 mg total) by mouth daily.    Dispense:  90 tablet    Refill:  0    Everrett Coombe, MD,MS,  PGY3 01/28/2019 2:27 PM

## 2019-01-28 NOTE — Assessment & Plan Note (Addendum)
Patient not taking Entresto because it is too expensive.  Considered starting an ARB, however concerned he may develop hyperkalemia given that he is also taking spironolactone.  We will increase his dose of Norvasc to 10 mg daily.  Ordered a lipid panel for routine screening/risk stratification, last study was done in 2017

## 2019-01-29 LAB — LIPID PANEL
Chol/HDL Ratio: 2.6 ratio (ref 0.0–5.0)
Cholesterol, Total: 204 mg/dL — ABNORMAL HIGH (ref 100–199)
HDL: 79 mg/dL (ref >39)
LDL Calculated: 106 mg/dL — ABNORMAL HIGH (ref 0–99)
Triglycerides: 94 mg/dL (ref 0–149)
VLDL Cholesterol Cal: 19 mg/dL (ref 5–40)

## 2019-02-17 ENCOUNTER — Telehealth (INDEPENDENT_AMBULATORY_CARE_PROVIDER_SITE_OTHER): Payer: Medicare Other | Admitting: Student in an Organized Health Care Education/Training Program

## 2019-02-17 ENCOUNTER — Other Ambulatory Visit: Payer: Self-pay

## 2019-02-17 DIAGNOSIS — E782 Mixed hyperlipidemia: Secondary | ICD-10-CM

## 2019-02-17 DIAGNOSIS — E785 Hyperlipidemia, unspecified: Secondary | ICD-10-CM | POA: Insufficient documentation

## 2019-02-17 DIAGNOSIS — I5022 Chronic systolic (congestive) heart failure: Secondary | ICD-10-CM

## 2019-02-17 DIAGNOSIS — I10 Essential (primary) hypertension: Secondary | ICD-10-CM

## 2019-02-17 MED ORDER — LOSARTAN POTASSIUM 25 MG PO TABS: 25.0000 mg | ORAL_TABLET | Freq: Every day | ORAL | 0 refills | Status: DC

## 2019-02-17 MED ORDER — ATORVASTATIN CALCIUM 20 MG PO TABS: 20.0000 mg | ORAL_TABLET | Freq: Every day | ORAL | 0 refills | Status: DC

## 2019-02-17 MED FILL — ATORVASTATIN 20 MG TABLET: 20 | 30 days supply | Qty: 30 | Fill #0

## 2019-02-17 MED FILL — LOSARTAN POTASSIUM 25 MG TA: 25 | 30 days supply | Qty: 30 | Fill #0

## 2019-02-17 NOTE — Assessment & Plan Note (Addendum)
Prescribed losartan Requested patient return in 2-3 weeks for appointment to assess electrolyte status (patient also on spironolactone) and his BP. Reaching out to pharm to pursue assistance in obtaining entresto which patient was previously taking with good BP control - clinic schedule was not accessible to me at this time so setting reminder to set appointment when opens up

## 2019-02-17 NOTE — Assessment & Plan Note (Signed)
Elevated total cholesterol and LDL Starting atorvastatin 20mg  today with plan to increase if tolerated. Patient also takes colchicine  Return in 2-3 weeks for in person appointment to assess and possibly increase dose

## 2019-02-17 NOTE — Progress Notes (Signed)
[facility] Telemedicine Visit  Patient consented to have virtual visit. Method of visit: Telephone  Encounter participants: Patient: [name] - located at home Provider: Richarda Osmond - located at [facility]  Chief Complaint: medication and lab f/u  HPI: Patient states that he is doing well overall. He is trying to stay home and away from crowds. He has no complaints today. Wants to discuss entresto. Patient previously on entresto for BP control. no longer able to afford prescription- increased to $500 initially and then ~$45 each month after. He states that his blood pressure is not as well controlled since stopping ~150s. He is open to trying another medication until we can look into [employer]. He denies any current chest pain, headaches, changes in vision. At last appointment, patient had lipid panel collected which showed elevated total and LDL. He is agreeable to starting statin.   ROS: per HPI  Pertinent PMHx: HTN, HFrEF, HLD, gout  Exam:  Respiratory: speaking in full sentences  Assessment/Plan:  Hypertension Prescribed losartan Requested patient return in 2-3 weeks for appointment to assess electrolyte status (patient also on spironolactone) and his BP. Reaching out to pharm to pursue assistance in obtaining entresto which patient was previously taking with good BP control - clinic schedule was not accessible to me at this time so setting reminder to set appointment when opens up  Mixed hyperlipidemia Elevated total cholesterol and LDL Starting atorvastatin 20mg  today with plan to increase if tolerated. Patient also takes colchicine  Return in 2-3 weeks for in person appointment to assess and possibly increase dose    Time spent during visit with patient: 10 minutes

## 2019-02-19 ENCOUNTER — Telehealth: Payer: Self-pay | Admitting: *Deleted

## 2019-02-19 NOTE — Telephone Encounter (Signed)
-----   Message from Richarda Osmond, DO sent at 02/17/2019  3:12 PM EDT ----- Regarding: appointment to check labs [facility] team, I'm starting our patient on a couple new meds today and want to check labs in 2-3 weeks, but my schedule is not open yet. Can you please schedule [name] and let him know? He prefers morning appointments. I should have clinic days the first week of June once it opens up. Thanks, [employer]

## 2019-02-19 NOTE — Telephone Encounter (Signed)
Pt informed and scheduled for an appt. Bodi Palmeri, CMA  

## 2019-02-25 ENCOUNTER — Other Ambulatory Visit: Payer: Self-pay | Admitting: Student in an Organized Health Care Education/Training Program

## 2019-02-25 DIAGNOSIS — L309 Dermatitis, unspecified: Secondary | ICD-10-CM

## 2019-02-26 ENCOUNTER — Other Ambulatory Visit: Payer: Self-pay | Admitting: Student in an Organized Health Care Education/Training Program

## 2019-02-26 DIAGNOSIS — L309 Dermatitis, unspecified: Secondary | ICD-10-CM

## 2019-02-26 MED FILL — AMLODIPINE BESYLATE 10 MG T: 10 | 90 days supply | Qty: 90 | Fill #0

## 2019-02-26 MED FILL — CARVEDILOL 3.125 MG TABLET: 3.125 | 60 days supply | Qty: 120 | Fill #0

## 2019-02-26 MED FILL — TRIAMCINOLONE 0.5% CREAM: 0.5 | 15 days supply | Qty: 30 | Fill #0

## 2019-02-26 MED FILL — SPIRONOLACTONE 25 MG TABLET: 25 | 30 days supply | Qty: 15 | Fill #0

## 2019-03-01 ENCOUNTER — Ambulatory Visit (INDEPENDENT_AMBULATORY_CARE_PROVIDER_SITE_OTHER): Payer: Medicare Other | Admitting: Student in an Organized Health Care Education/Training Program

## 2019-03-01 ENCOUNTER — Other Ambulatory Visit: Payer: Self-pay

## 2019-03-01 ENCOUNTER — Encounter: Payer: Self-pay | Admitting: Student in an Organized Health Care Education/Training Program

## 2019-03-01 VITALS — BP 110/68 | HR 68

## 2019-03-01 DIAGNOSIS — B360 Pityriasis versicolor: Secondary | ICD-10-CM | POA: Diagnosis not present

## 2019-03-01 DIAGNOSIS — I10 Essential (primary) hypertension: Secondary | ICD-10-CM

## 2019-03-01 MED ORDER — SELENIUM SULFIDE 2.5 % EX LOTN: 1.0000 "application " | TOPICAL_LOTION | Freq: Every day | CUTANEOUS | 12 refills | Status: DC | PRN

## 2019-03-01 MED FILL — SELENIUM SULF 2.5% LOTION: 2.5 | 30 days supply | Qty: 118 | Fill #0

## 2019-03-01 NOTE — Assessment & Plan Note (Signed)
Patient has psoriasis on limbs.  New hypopigmented lesions on base of head in distribution of cap wearing Trial of selenium sulfide. Most topicals are not well reimbursed by insurance

## 2019-03-01 NOTE — Patient Instructions (Signed)
It was a pleasure to see you today!  To summarize our discussion for this visit:  We will try a topical cream for the spots that are formed at the base of your head. And I will refill your ointment for your arms. Please let me know if it is not working so I can prescribe something different  We are checking your potassium today  Your blood pressure looks great! So we will stick to your current regimen for now, but if that changes, we can try to get support for entresto again  We cannot get your pneumococcal vaccine until you are officially 45.   Some additional health maintenance measures we should update are: . Pneumococcal vaccine after age 49  Call the clinic at [phone] if your symptoms worsen or you have any concerns.  Thank you for allowing me to take part in your care,  Dr. Doristine Mango   Thanks for choosing [facility] for your primary care.

## 2019-03-01 NOTE — Progress Notes (Signed)
Subjective:    Patient ID: [name], male    DOB: 1956/04/21, 63 y.o.   MRN: [id number]   CC: follow up from starting new medication  HPI: Patient states he is feeling well and has no complaints.  Cards: He had discontinued his entresto due to significant price increase. 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 He has been taking losartan for about a week now. He denies any new symptoms including lip/mouth swelling, difficulty with speech, breathing or swallowing, no LE edema, no chest pain, no lightheadedness, SOB. BP today 110/68, HR 68. Patient last saw HF clinic 03/2018, and cardiology 11/19.   Derm: Patient has been working out in his yard and garden every day. He wears a hat every day. He has more produce than he can eat himself so he gives a lot of it away. He even offered to bring some to [place] here at the clinic. He currently has one of his 6 children living with him and 3? Grandchildren. (has a son in the army and a daughter in the [employer]). Patient has not noticed the new lesions around cap distribution and denies any irritation at that area. States that he has his chronic psoriasis lesions which are stable on his limbs and trunk and not really improved with his triamcinolone. He states that he has had to mix this medication with vaseline for years now because he does not have enough and was afraid to say anything because last time he brought up the medication, someone decreased his dose and he didn't want that to happen again.   Patient turns 63 this month- we discussed administering pneumovaccine once he turns 63 and patient is agreeable.   Smoking status reviewed   ROS: pertinent noted in the HPI   Past Medical History:  Diagnosis Date  . Alcohol abuse    still drink beer  . CHF (congestive heart failure) ([place])   . Chronic combined systolic and diastolic heart failure, NYHA class 3 ([place]) 06/02/2012  . Chronic  systolic heart failure ([place]) 06/02/2012  . Hypertension   . Need for prophylactic vaccination and inoculation against single disease 04/15/2018  . NICM (nonischemic cardiomyopathy) ([place]) 09/04/2012  . Psoriasis    Possible psoriasis in the past  . Pulmonary nodule, right 11/03/2012    Past Surgical History:  Procedure Laterality Date  . LEFT AND RIGHT HEART CATHETERIZATION WITH CORONARY ANGIOGRAM N/A 05/25/2012   Procedure: LEFT AND RIGHT HEART CATHETERIZATION WITH CORONARY ANGIOGRAM;  Surgeon: Jolaine Artist, MD;  Location[facility] C[facility] LAB;  Service: Cardiovascular;  Laterality: N/A;  . SKIN GRAFT     L arm got caught in machine in 1979    Past medical history, surgical, family, and social history reviewed and updated in the EMR as appropriate.  Objective:  BP 110/68   Pulse 68   SpO2 97%   Vitals and nursing note reviewed  General: NAD, pleasant, able to participate in exam Cardiac: RRR, S1 S2 present. normal heart sounds, no murmurs. Respiratory: CTAB, normal effort, No wheezes, rales or rhonchi Extremities: no edema or cyanosis. Skin: warm and dry, diffuse dime-sized raised lesions on arms and legs. New finding- hypopigmented lesions around base of head in cap distribution. Hair follicles included.  Neuro: alert, no obvious focal deficits Psych: Normal affect and mood   Assessment & Plan:    Tinea versicolor due to Malassezia furfur Patient has psoriasis on limbs.  New  hypopigmented lesions on base of head in distribution of cap wearing Trial of selenium sulfide. Most topicals are not well reimbursed by insurance  Hypertension Well controlled  Continue losartan Will hold off on pursuing entresto as BP and HR low/normal  Checking electrolytes today as patient also on spironolactone which was 4.9 (called patient to inform of results) Continue to follow with heart failure clinic Monitor BP again at next appointment    Doristine Mango, [place]  PGY-1

## 2019-03-02 LAB — COMPREHENSIVE METABOLIC PANEL
ALT: 18 IU/L (ref 0–44)
AST: 23 IU/L (ref 0–40)
Albumin/Globulin Ratio: 1.6 (ref 1.2–2.2)
Albumin: 4.3 g/dL (ref 3.8–4.8)
Alkaline Phosphatase: 47 IU/L (ref 39–117)
BUN/Creatinine Ratio: 15 (ref 10–24)
BUN: 23 mg/dL (ref 8–27)
Bilirubin Total: 0.7 mg/dL (ref 0.0–1.2)
CO2: 23 mmol/L (ref 20–29)
Calcium: 9 mg/dL (ref 8.6–10.2)
Chloride: 103 mmol/L (ref 96–106)
Creatinine, Ser: 1.5 mg/dL — ABNORMAL HIGH (ref 0.76–1.27)
GFR calc Af Amer: 56 mL/min/{1.73_m2} — ABNORMAL LOW (ref >59)
GFR calc non Af Amer: 49 mL/min/{1.73_m2} — ABNORMAL LOW (ref >59)
Globulin, Total: 2.7 g/dL (ref 1.5–4.5)
Glucose: 90 mg/dL (ref 65–99)
Potassium: 4.9 mmol/L (ref 3.5–5.2)
Sodium: 139 mmol/L (ref 134–144)
Total Protein: 7 g/dL (ref 6.0–8.5)

## 2019-03-02 NOTE — Assessment & Plan Note (Addendum)
Well controlled  Continue losartan Will hold off on pursuing entresto as BP and HR low/normal  Checking electrolytes today as patient also on spironolactone which was 4.9 (called patient to inform of results) Continue to follow with heart failure clinic Monitor BP again at next appointment

## 2019-04-01 ENCOUNTER — Other Ambulatory Visit: Payer: Self-pay | Admitting: Student in an Organized Health Care Education/Training Program

## 2019-04-01 MED FILL — SPIRONOLACTONE 25 MG TABLET: 25 | 30 days supply | Qty: 15 | Fill #1

## 2019-04-01 MED FILL — SELENIUM SULF 2.5% LOTION: 2.5 | 30 days supply | Qty: 118 | Fill #1

## 2019-04-02 ENCOUNTER — Other Ambulatory Visit: Payer: Self-pay | Admitting: Student in an Organized Health Care Education/Training Program

## 2019-04-02 DIAGNOSIS — L309 Dermatitis, unspecified: Secondary | ICD-10-CM

## 2019-04-06 MED FILL — TRIAMCINOLONE 0.5% CREAM: 0.5 | 15 days supply | Qty: 30 | Fill #0

## 2019-04-14 ENCOUNTER — Other Ambulatory Visit: Payer: Self-pay | Admitting: Student in an Organized Health Care Education/Training Program

## 2019-04-14 ENCOUNTER — Other Ambulatory Visit: Payer: Self-pay | Admitting: Family Medicine

## 2019-04-14 ENCOUNTER — Other Ambulatory Visit: Payer: Medicare Other

## 2019-04-14 ENCOUNTER — Telehealth (INDEPENDENT_AMBULATORY_CARE_PROVIDER_SITE_OTHER): Payer: Medicare Other | Admitting: Family Medicine

## 2019-04-14 ENCOUNTER — Other Ambulatory Visit: Payer: Self-pay

## 2019-04-14 DIAGNOSIS — I5042 Chronic combined systolic (congestive) and diastolic (congestive) heart failure: Secondary | ICD-10-CM | POA: Diagnosis not present

## 2019-04-14 DIAGNOSIS — I10 Essential (primary) hypertension: Secondary | ICD-10-CM | POA: Diagnosis not present

## 2019-04-14 DIAGNOSIS — I5022 Chronic systolic (congestive) heart failure: Secondary | ICD-10-CM

## 2019-04-14 DIAGNOSIS — R197 Diarrhea, unspecified: Secondary | ICD-10-CM | POA: Diagnosis not present

## 2019-04-14 DIAGNOSIS — E782 Mixed hyperlipidemia: Secondary | ICD-10-CM

## 2019-04-14 MED ORDER — LOPERAMIDE HCL 2 MG PO TABS: 2.0000 mg | ORAL_TABLET | Freq: Three times a day (TID) | ORAL | 0 refills | Status: DC | PRN

## 2019-04-14 MED FILL — LOSARTAN POTASSIUM 25 MG TA: 25 | 90 days supply | Qty: 90 | Fill #0

## 2019-04-14 MED FILL — ATORVASTATIN 20 MG TABLET: 20 | 90 days supply | Qty: 90 | Fill #0

## 2019-04-14 NOTE — Addendum Note (Signed)
Addended by: [place] Pink on: 04/14/2019 10:51 AM   Modules accepted: Orders

## 2019-04-14 NOTE — Addendum Note (Signed)
Addended by: Caeleb Batalla, [place] J on: 04/14/2019 09:55 AM   Modules accepted: Orders

## 2019-04-14 NOTE — Assessment & Plan Note (Signed)
Patient sent loperamide for continued management of his diarrhea.

## 2019-04-14 NOTE — Progress Notes (Signed)
[facility] Telemedicine Visit  Patient consented to have virtual visit. Method of visit: Telephone  Encounter participants: Patient: [name] - located at home Provider: [place] Akeria Hedstrom - located at [facility] Others (if applicable): n/a  Chief Complaint: diarrhea, weight loss  HPI: Patient reports that for the past 2 weeks he has had trouble keeping any food down as he has had pretty severe diarrhea.  He states that he weighs himself frequently due to his heart failure and he lost 10 to 12 pounds due to constant diarrhea.  He states that since Monday he has had much less diarrhea and has put back 5 pounds.  He is calling to see if there is something that we can do to continue to slow his diarrhea.  He also wanted to update [place] on his significant weight changes.  He does deny any shortness of breath, chest pain, lower extremity swelling.  No sick contacts, fevers, chills, no body aches, no cough. No known exposure to COVID19 and no pending tests.    ROS: per HPI  Pertinent PMHx: HTN, CHF, CKD  Exam:  Respiratory: talking in complete sentences with no trouble  Assessment/Plan:  Diarrhea Patient sent loperamide for continued management of his diarrhea.  Chronic combined systolic and diastolic heart failure, NYHA class 3 (HCC) Given patient's significant weight loss and then weight gain with recent decrease in kidney function, patient asked to come in for lab evaluation with BMP and CBC.  He states that he is overall feeling better now that his diarrhea has slowed down.   Discussed strict return precautions with patient and will follow-up his recent labs.  Time spent during visit with patient: 8 minutes  [place] Shaul Trautman, DO PGY-3, [place]

## 2019-04-14 NOTE — Progress Notes (Signed)
190lb bp- n/n t- no fever  Pt gave consent for Telephone visit  [place], [place] - [address] [facility].

## 2019-04-14 NOTE — Addendum Note (Signed)
Addended by: Cletis Muma, [place] J on: 04/14/2019 12:13 PM   Modules accepted: Orders

## 2019-04-14 NOTE — Progress Notes (Signed)
Labs placed from telephone visit today.   Tony Kayton Ripp, DO PGY-3, [facility]

## 2019-04-14 NOTE — Assessment & Plan Note (Signed)
Given patient's significant weight loss and then weight gain with recent decrease in kidney function, patient asked to come in for lab evaluation with BMP and CBC.  He states that he is overall feeling better now that his diarrhea has slowed down.

## 2019-04-15 LAB — CBC
Hematocrit: 37.6 % (ref 37.5–51.0)
Hemoglobin: 12.8 g/dL — ABNORMAL LOW (ref 13.0–17.7)
MCH: 30 pg (ref 26.6–33.0)
MCHC: 34 g/dL (ref 31.5–35.7)
MCV: 88 fL (ref 79–97)
Platelets: 192 10*3/uL (ref 150–450)
RBC: 4.27 x10E6/uL (ref 4.14–5.80)
RDW: 14.1 % (ref 11.6–15.4)
WBC: 6 10*3/uL (ref 3.4–10.8)

## 2019-04-15 LAB — BASIC METABOLIC PANEL
BUN/Creatinine Ratio: 10 (ref 10–24)
BUN: 14 mg/dL (ref 8–27)
CO2: 23 mmol/L (ref 20–29)
Calcium: 8.9 mg/dL (ref 8.6–10.2)
Chloride: 101 mmol/L (ref 96–106)
Creatinine, Ser: 1.42 mg/dL — ABNORMAL HIGH (ref 0.76–1.27)
GFR calc Af Amer: 59 mL/min/{1.73_m2} — ABNORMAL LOW (ref >59)
GFR calc non Af Amer: 51 mL/min/{1.73_m2} — ABNORMAL LOW (ref >59)
Glucose: 85 mg/dL (ref 65–99)
Potassium: 4.4 mmol/L (ref 3.5–5.2)
Sodium: 139 mmol/L (ref 134–144)

## 2019-05-07 ENCOUNTER — Other Ambulatory Visit: Payer: Self-pay | Admitting: Student in an Organized Health Care Education/Training Program

## 2019-05-07 MED FILL — TRIAMCINOLONE 0.5% CREAM: 0.5 | 15 days supply | Qty: 30 | Fill #1

## 2019-05-07 MED FILL — SELENIUM SULF 2.5% LOTION: 2.5 | 30 days supply | Qty: 118 | Fill #2

## 2019-05-14 MED FILL — SPIRONOLACTONE 25 MG TABLET: 25 | 30 days supply | Qty: 15 | Fill #0

## 2019-05-14 NOTE — Telephone Encounter (Signed)
K in July was normal

## 2019-06-15 MED FILL — TRIAMCINOLONE 0.5% CREAM: 0.5 | 15 days supply | Qty: 30 | Fill #2

## 2019-06-15 MED FILL — CARVEDILOL 3.125 MG TABLET: 3.125 | 60 days supply | Qty: 120 | Fill #0

## 2019-06-15 MED FILL — SPIRONOLACTONE 25 MG TABLET: 25 | 30 days supply | Qty: 15 | Fill #1

## 2019-06-15 MED FILL — SELENIUM SULF 2.5% LOTION: 2.5 | 30 days supply | Qty: 118 | Fill #3

## 2019-08-05 ENCOUNTER — Other Ambulatory Visit: Payer: Self-pay | Admitting: Student in an Organized Health Care Education/Training Program

## 2019-08-05 DIAGNOSIS — M109 Gout, unspecified: Secondary | ICD-10-CM

## 2019-08-05 MED FILL — SELENIUM SULF 2.5% LOTION: 2.5 | 30 days supply | Qty: 118 | Fill #4

## 2019-08-05 MED FILL — TRIAMCINOLONE 0.5% CREAM: 0.5 | 15 days supply | Qty: 30 | Fill #3

## 2019-08-05 MED FILL — SPIRONOLACTONE 25 MG TABLET: 25 | 30 days supply | Qty: 15 | Fill #2

## 2019-08-17 ENCOUNTER — Other Ambulatory Visit: Payer: Self-pay

## 2019-08-17 ENCOUNTER — Ambulatory Visit (INDEPENDENT_AMBULATORY_CARE_PROVIDER_SITE_OTHER): Payer: Medicare Other | Admitting: *Deleted

## 2019-08-17 DIAGNOSIS — Z23 Encounter for immunization: Secondary | ICD-10-CM | POA: Diagnosis not present

## 2019-09-06 ENCOUNTER — Other Ambulatory Visit: Payer: Self-pay | Admitting: Student in an Organized Health Care Education/Training Program

## 2019-09-06 DIAGNOSIS — I10 Essential (primary) hypertension: Secondary | ICD-10-CM

## 2019-09-06 DIAGNOSIS — I5022 Chronic systolic (congestive) heart failure: Secondary | ICD-10-CM

## 2019-09-06 DIAGNOSIS — E782 Mixed hyperlipidemia: Secondary | ICD-10-CM

## 2019-09-06 DIAGNOSIS — L309 Dermatitis, unspecified: Secondary | ICD-10-CM

## 2019-09-06 MED FILL — CARVEDILOL 3.125 MG TABLET: 3.125 | 60 days supply | Qty: 120 | Fill #1

## 2019-09-06 MED FILL — SPIRONOLACTONE 25 MG TABS: 25 | 30 days supply | Qty: 15 | Fill #3

## 2019-09-06 MED FILL — SELENIUM SULF 2.5% LOTION: 2.5 | 30 days supply | Qty: 118 | Fill #5

## 2019-09-08 MED FILL — ATORVASTATIN 20 MG TABLET: 20 | 90 days supply | Qty: 90 | Fill #0

## 2019-09-08 MED FILL — TRIAMCINOLONE 0.5% CREAM: 0.5 | 15 days supply | Qty: 30 | Fill #0

## 2019-09-08 MED FILL — LOSARTAN POTASSIUM 25 MG TA: 25 | 90 days supply | Qty: 90 | Fill #0

## 2019-09-08 MED FILL — AMLODIPINE BESYLATE 10 MG T: 10 | 90 days supply | Qty: 90 | Fill #0

## 2019-10-12 ENCOUNTER — Other Ambulatory Visit: Payer: Self-pay | Admitting: Family Medicine

## 2019-10-12 MED FILL — SPIRONOLACTONE 25 MG TABS: 25 | 90 days supply | Qty: 45 | Fill #0

## 2019-12-13 ENCOUNTER — Other Ambulatory Visit: Payer: Self-pay | Admitting: Student in an Organized Health Care Education/Training Program

## 2019-12-13 DIAGNOSIS — E782 Mixed hyperlipidemia: Secondary | ICD-10-CM

## 2019-12-13 DIAGNOSIS — I10 Essential (primary) hypertension: Secondary | ICD-10-CM

## 2019-12-13 DIAGNOSIS — I5022 Chronic systolic (congestive) heart failure: Secondary | ICD-10-CM

## 2019-12-13 MED FILL — TRIAMCINOLONE 0.5% CREAM: 0.5 | 15 days supply | Qty: 30 | Fill #1

## 2019-12-13 MED FILL — CARVEDILOL 3.125 MG TABLET: 3.125 | 60 days supply | Qty: 120 | Fill #2

## 2019-12-13 MED FILL — SELENIUM SULF 2.5% LOTION: 2.5 | 30 days supply | Qty: 118 | Fill #6

## 2019-12-14 ENCOUNTER — Other Ambulatory Visit: Payer: Self-pay

## 2019-12-14 ENCOUNTER — Ambulatory Visit (HOSPITAL_COMMUNITY)
Admission: RE | Admit: 2019-12-14 | Discharge: 2019-12-14 | Disposition: A | Discharge status: Home / Self Care | Payer: Medicare Other | Source: Ambulatory Visit | Attending: Family Medicine | Admitting: Family Medicine

## 2019-12-14 ENCOUNTER — Ambulatory Visit (INDEPENDENT_AMBULATORY_CARE_PROVIDER_SITE_OTHER): Payer: Medicare Other | Admitting: Family Medicine

## 2019-12-14 ENCOUNTER — Other Ambulatory Visit: Payer: Self-pay | Admitting: Student in an Organized Health Care Education/Training Program

## 2019-12-14 VITALS — BP 110/58 | HR 70 | Wt 196.6 lb

## 2019-12-14 DIAGNOSIS — M25571 Pain in right ankle and joints of right foot: Secondary | ICD-10-CM | POA: Diagnosis not present

## 2019-12-14 DIAGNOSIS — M79671 Pain in right foot: Secondary | ICD-10-CM | POA: Diagnosis not present

## 2019-12-14 MED ORDER — METHYLPREDNISOLONE 4 MG PO TBPK: ORAL_TABLET | ORAL | 0 refills | Status: DC

## 2019-12-14 MED FILL — METHYLPREDNISOLONE 4 MG TBP: 4 | 6 days supply | Qty: 21 | Fill #0

## 2019-12-14 NOTE — Progress Notes (Signed)
Subjective:   Patient ID: [name]    DOB: 01-20-1956, 64 y.o. male   MRN: [id number]  [name] is a 64 y.o. male with a history of CHF, HTN, NICM, chronic eczema, CKD, bilateral low back pain, gout, HLD here for "gout flare"  Right Foot pain: Patient presents with pain in the right dorsal lateral foot x 1 week. He notes the pain started in his achilles but then moved to his lateral mid foot. He notes the pain in his achilles is resolved, but the pain in his dorsal mid foot is constant and feels "like a gout flare", "like a tooth ache" "just achy". He notes he has history of gout in his 1st big toe but never in the middle of his foot. He notes pain is worse first thing in the morning upon walking first thing in the morning, but improves slightly later in the day. He notes it gets better when gets moving around. Denies any trauma or new increased activity. He notes it hurts to put shoes on. Notes mild swelling. He notes he took 4 pills of Colchicine (3 on Sunday and 1 on Monday). Notes he doesn't think it really helped.    Review of Systems:  Per HPI.   Objective:   BP (!) 110/58   Pulse 70   Wt 196 lb 9.6 oz (89.2 kg)   SpO2 99%   BMI 28.21 kg/m  Vitals and nursing note reviewed.  General: pleasant older AA male, sitting comfortably in exam chair, well nourished, well developed, in no acute distress with non-toxic appearance Resp Breathing comfortably on room air, speaking in full sentences Skin: warm, dry, no open sores or signs of infection along right foot Extremities: warm and well perfused MSK: antalgic gait favoring the right, see below Neuro: Alert and oriented, speech normal  Right Foot: Inspection:  No obvious bony deformity. 1+ pitting edema to right foot with mild swelling.  No erythema, increased warmth, or bruising.  Normal arch Palpation: Very tender to palpation along lateral right foot extending from anterior base of dorsal ankle to base of 5th toe  ROM:  Full  ROM of the ankle and toes.  Strength: 5/5 strength ankle in all planes Neurovascular: N/V intact distally in the lower extremity Special tests: Positive squeeze. Unable to bare weight on foot to examine heel raise   Assessment & Plan:   Acute pain of right foot Patient presenting today with acute right dorsal lateral foot pain.  He has a past medical history significant for gout.  Denies any recent trauma or increase in activity.  Physical exam notable for diffuse pain along lateral aspect of right foot and mild swelling.  No signs of acute infection. Unknown prognosis and unclear etiology at this time -differential includes osteoarthritis, abnormal presentation of gout, fracture although less likely given lack of trauma.  Will obtain imaging and treat with steroid Dosepak.  Patient instructed to return in 1 to 2 weeks if no improvement.  Will refer to sports medicine clinic at that time for ultrasound and further evaluation. - Complete right ankle and foot x-ray - Medrol dose pack - RTC if no improvement in 1-2 weeks. Plan to refer to sports medicine clinic at that time.   Patient evaluated and plan discussed with Dr. Sheppard Coil.  Orders Placed This Encounter  Procedures  . DG Ankle Complete Right    No history of trauma History of gout    Standing Status:   Future  Number of Occurrences:   1    Standing Expiration Date:   02/12/2021    Order Specific Question:   Reason for Exam (SYMPTOM  OR DIAGNOSIS REQUIRED)    Answer:   right dorsal lateral foot pain, acute    Order Specific Question:   Preferred imaging location?    Answer:   [facility]    Order Specific Question:   Radiology Contrast Protocol - do NOT remove file path    Answer:   \\[email or web address]\[email or web address]\Radiant\DXFluoroContrastProtocols.pdf  . DG Foot Complete Right    No history of trauma History of gout    Standing Status:   Future    Number of Occurrences:   1    Standing Expiration Date:   02/12/2021    Order  Specific Question:   Reason for Exam (SYMPTOM  OR DIAGNOSIS REQUIRED)    Answer:   right dorsal lateral foot pain, acute    Order Specific Question:   Preferred imaging location?    Answer:   [facility]    Order Specific Question:   Radiology Contrast Protocol - do NOT remove file path    Answer:   \\[email or web address]\Radiant\DXFluoroContrastProtocols.pdf   Meds ordered this encounter  Medications  . methylPREDNISolone (MEDROL DOSEPAK) 4 MG TBPK tablet    Sig: Take six tablets on day 1, five tablets on day 2, four tablets on day 3, three tablets on day 4, two tablets on day 5, and 1 tablet on day 6.    Dispense:  21 tablet    Refill:  0   Tony Marble, DO PGY-2, [facility] 12/14/2019 1:32 PM

## 2019-12-14 NOTE — Patient Instructions (Signed)
Thank you for coming to see me today. It was a pleasure to see you.   I have ordered x-rays of your right foot and ankle. I will call you with the results. Please take the Medrol dose pack as prescribed and to completion. If no improvement in the pain in 1-2 weeks, please call or return. We will likely refer you to our Sports Medicine clinic for further evaluation.   If you have any questions or concerns, please do not hesitate to call the office at [phone].  Take Care,   Dr. Mina Marble, DO Resident Physician [facility] [phone]

## 2019-12-14 NOTE — Assessment & Plan Note (Addendum)
Patient presenting today with acute right dorsal lateral foot pain.  He has a past medical history significant for gout.  Denies any recent trauma or increase in activity.  Physical exam notable for diffuse pain along lateral aspect of right foot and mild swelling.  No signs of acute infection. Unknown prognosis and unclear etiology at this time -differential includes osteoarthritis, abnormal presentation of gout, fracture although less likely given lack of trauma.  Will obtain imaging and treat with steroid Dosepak.  Patient instructed to return in 1 to 2 weeks if no improvement.  Will refer to sports medicine clinic at that time for ultrasound and further evaluation. - Complete right ankle and foot x-ray - Medrol dose pack - RTC if no improvement in 1-2 weeks. Plan to refer to sports medicine clinic at that time.   Patient evaluated and plan discussed with Dr. Sheppard Coil.

## 2019-12-15 MED FILL — LOSARTAN POTASSIUM 25 MG TA: 25 | 90 days supply | Qty: 90 | Fill #0

## 2019-12-15 MED FILL — AMLODIPINE BESYLATE 10 MG T: 10 | 90 days supply | Qty: 90 | Fill #0

## 2019-12-15 MED FILL — ATORVASTATIN 20 MG TABLET: 20 | 90 days supply | Qty: 90 | Fill #0

## 2020-01-05 ENCOUNTER — Other Ambulatory Visit: Payer: Self-pay | Admitting: Student in an Organized Health Care Education/Training Program

## 2020-01-05 MED FILL — SPIRONOLACTONE 25 MG TABS: 25 | 90 days supply | Qty: 45 | Fill #0

## 2020-04-06 MED FILL — TRIAMCINOLONE 0.5% CREAM: 0.5 | 15 days supply | Qty: 30 | Fill #2

## 2020-05-02 ENCOUNTER — Other Ambulatory Visit: Payer: Self-pay | Admitting: Student in an Organized Health Care Education/Training Program

## 2020-05-02 MED FILL — SELENIUM SULF 2.5% LOTION: 2.5 | 30 days supply | Qty: 120 | Fill #0

## 2020-05-31 ENCOUNTER — Other Ambulatory Visit: Payer: Self-pay | Admitting: Student in an Organized Health Care Education/Training Program

## 2020-05-31 MED FILL — SPIRONOLACTONE 25 MG TABS: 25 | 90 days supply | Qty: 45 | Fill #1

## 2020-05-31 MED FILL — TRIAMCINOLONE 0.5% CREAM: 0.5 | 15 days supply | Qty: 30 | Fill #3

## 2020-05-31 MED FILL — SELENIUM SULF 2.5% LOTION: 2.5 | 30 days supply | Qty: 120 | Fill #1

## 2020-06-01 MED FILL — CARVEDILOL 3.125 MG TABLET: 3.125 | 30 days supply | Qty: 60 | Fill #0

## 2020-06-02 ENCOUNTER — Other Ambulatory Visit: Payer: Self-pay

## 2020-06-02 ENCOUNTER — Ambulatory Visit (INDEPENDENT_AMBULATORY_CARE_PROVIDER_SITE_OTHER): Payer: Medicare Other | Admitting: Student in an Organized Health Care Education/Training Program

## 2020-06-02 VITALS — BP 146/80 | HR 82 | Ht 72.0 in | Wt 183.8 lb

## 2020-06-02 DIAGNOSIS — I5042 Chronic combined systolic (congestive) and diastolic (congestive) heart failure: Secondary | ICD-10-CM | POA: Diagnosis not present

## 2020-06-02 DIAGNOSIS — R972 Elevated prostate specific antigen [PSA]: Secondary | ICD-10-CM

## 2020-06-02 DIAGNOSIS — Z7289 Other problems related to lifestyle: Secondary | ICD-10-CM | POA: Diagnosis not present

## 2020-06-02 DIAGNOSIS — Z0389 Encounter for observation for other suspected diseases and conditions ruled out: Secondary | ICD-10-CM | POA: Diagnosis not present

## 2020-06-02 DIAGNOSIS — Z114 Encounter for screening for human immunodeficiency virus [HIV]: Secondary | ICD-10-CM

## 2020-06-02 DIAGNOSIS — R634 Abnormal weight loss: Secondary | ICD-10-CM

## 2020-06-02 DIAGNOSIS — Z125 Encounter for screening for malignant neoplasm of prostate: Secondary | ICD-10-CM | POA: Diagnosis not present

## 2020-06-02 DIAGNOSIS — N429 Disorder of prostate, unspecified: Secondary | ICD-10-CM

## 2020-06-02 DIAGNOSIS — Z131 Encounter for screening for diabetes mellitus: Secondary | ICD-10-CM | POA: Diagnosis not present

## 2020-06-02 DIAGNOSIS — E7489 Other specified disorders of carbohydrate metabolism: Secondary | ICD-10-CM

## 2020-06-02 NOTE — Progress Notes (Signed)
   SUBJECTIVE:   CHIEF COMPLAINT / HPI:  Unintentional weight loss for several weeks- (13 pounds since last visit <6 months ago).  He is typically active and works in his yard but has not had any change or increase in activity level. Denies any GI symptoms such as decreased appetite, abdominal pain, change in bowel movements, blood in stool. Has BM every 2 to 3 days which is regular for him. He does have to strain at times. No changes in bladder habits and no difficulty with urination. No changes in medication. Denies night sweats, rashes. Last colonoscopy was about 2 years ago showing tubular adenomas and recommended 3 year follow up.  OBJECTIVE:   BP (!) 146/80   Pulse 82   Ht 6' (1.829 m)   Wt 183 lb 12.8 oz (83.4 kg)   SpO2 98%   BMI 24.93 kg/m   General: NAD, pleasant, able to participate in exam HEENT: negative for lymphadenitis supraclavicular, cervical, auxillary Cardiac: RRR, normal heart sounds, no murmurs. 2+ radial and PT pulses bilaterally Respiratory: CTAB, normal effort, No wheezes, rales or rhonchi Abdomen: soft, nontender, nondistended, no hepatic or splenomegaly, +BS Extremities: no edema or cyanosis. WWP. Skin: warm and dry, no rashes noted Neuro: alert and oriented x4, no focal deficits Psych: Normal affect and mood  ASSESSMENT/PLAN:   Unintentional weight loss ~7% body weight loss in less than 6 months unintentionally but otherwise feels well and reports no symptoms or changes in lifestyle. Physical exam was normal. Concern for malignancy but no symptoms to point to type. Colonoscopy up to date. - obtaining screening labs today including CMP, CBC with diff, PSA, BNP, HIV, TSH, hgb A1c - PSA >100 urgent referral to urology - patient called and informed to call the clinic by Thursday morning if he has not heard from the referral.    Richarda Osmond, [place]

## 2020-06-02 NOTE — Patient Instructions (Signed)
It was a pleasure to see you today!  To summarize our discussion for this visit:  Your sudden weight loss could be related to your diet and activity level but does seem to be excessive for this amount of time and no large change in your habits.  We are going to collect blood work today to assess for any abnormalities that might point to a cause of your weight loss.  I will contact you with the results and we can decide on a plan going forward from that.  Some additional health maintenance measures we should update are: Health Maintenance Due  Topic Date Due  . PNA vac Low Risk Adult (1 of 2 - PCV13) Never done  . INFLUENZA VACCINE  04/30/2020  .    Call the clinic at [phone] if your symptoms worsen or you have any concerns.   Thank you for allowing me to take part in your care,  Dr. Doristine Mango

## 2020-06-03 LAB — CBC WITH DIFFERENTIAL
Basophils Absolute: 0.1 10*3/uL (ref 0.0–0.2)
Basos: 1 %
EOS (ABSOLUTE): 0.2 10*3/uL (ref 0.0–0.4)
Eos: 2 %
Hematocrit: 38.7 % (ref 37.5–51.0)
Hemoglobin: 12.9 g/dL — ABNORMAL LOW (ref 13.0–17.7)
Immature Grans (Abs): 0 10*3/uL (ref 0.0–0.1)
Immature Granulocytes: 0 %
Lymphocytes Absolute: 1.1 10*3/uL (ref 0.7–3.1)
Lymphs: 17 %
MCH: 28.9 pg (ref 26.6–33.0)
MCHC: 33.3 g/dL (ref 31.5–35.7)
MCV: 87 fL (ref 79–97)
Monocytes Absolute: 0.6 10*3/uL (ref 0.1–0.9)
Monocytes: 10 %
Neutrophils Absolute: 4.5 10*3/uL (ref 1.4–7.0)
Neutrophils: 70 %
RBC: 4.47 x10E6/uL (ref 4.14–5.80)
RDW: 13.5 % (ref 11.6–15.4)
WBC: 6.5 10*3/uL (ref 3.4–10.8)

## 2020-06-03 LAB — HIV ANTIBODY (ROUTINE TESTING W REFLEX): HIV Screen 4th Generation wRfx: NONREACTIVE

## 2020-06-03 LAB — COMPREHENSIVE METABOLIC PANEL
ALT: 27 IU/L (ref 0–44)
AST: 27 IU/L (ref 0–40)
Albumin/Globulin Ratio: 1.7 (ref 1.2–2.2)
Albumin: 4.3 g/dL (ref 3.8–4.8)
Alkaline Phosphatase: 74 IU/L (ref 48–121)
BUN/Creatinine Ratio: 14 (ref 10–24)
BUN: 17 mg/dL (ref 8–27)
Bilirubin Total: 0.5 mg/dL (ref 0.0–1.2)
CO2: 24 mmol/L (ref 20–29)
Calcium: 9 mg/dL (ref 8.6–10.2)
Chloride: 103 mmol/L (ref 96–106)
Creatinine, Ser: 1.24 mg/dL (ref 0.76–1.27)
GFR calc Af Amer: 70 mL/min/{1.73_m2} (ref >59)
GFR calc non Af Amer: 60 mL/min/{1.73_m2} (ref >59)
Globulin, Total: 2.6 g/dL (ref 1.5–4.5)
Glucose: 81 mg/dL (ref 65–99)
Potassium: 4.5 mmol/L (ref 3.5–5.2)
Sodium: 139 mmol/L (ref 134–144)
Total Protein: 6.9 g/dL (ref 6.0–8.5)

## 2020-06-03 LAB — TSH: TSH: 1.25 u[IU]/mL (ref 0.450–4.500)

## 2020-06-03 LAB — BRAIN NATRIURETIC PEPTIDE: BNP: 221.9 pg/mL — ABNORMAL HIGH (ref 0.0–100.0)

## 2020-06-04 LAB — SPECIMEN STATUS REPORT

## 2020-06-04 LAB — PSA: Prostate Specific Ag, Serum: 101 ng/mL — ABNORMAL HIGH (ref 0.0–4.0)

## 2020-06-05 DIAGNOSIS — R634 Abnormal weight loss: Secondary | ICD-10-CM | POA: Insufficient documentation

## 2020-06-05 NOTE — Assessment & Plan Note (Addendum)
~  7% body weight loss in less than 6 months unintentionally but otherwise feels well and reports no symptoms or changes in lifestyle. Physical exam was normal. Concern for malignancy but no symptoms to point to type. Colonoscopy up to date. - obtaining screening labs today including CMP, CBC with diff, PSA, BNP, HIV, TSH, hgb A1c - PSA >100 urgent referral to urology - patient called and informed to call the clinic by Thursday morning if he has not heard from the referral.

## 2020-07-19 DIAGNOSIS — R972 Elevated prostate specific antigen [PSA]: Secondary | ICD-10-CM | POA: Diagnosis not present

## 2020-07-20 ENCOUNTER — Other Ambulatory Visit (HOSPITAL_COMMUNITY): Payer: Self-pay | Admitting: Urology

## 2020-07-20 MED FILL — CIPROFLOXACIN HCL 500 MG TA: 500 | 4 days supply | Qty: 4 | Fill #0

## 2020-07-21 ENCOUNTER — Other Ambulatory Visit: Payer: Self-pay | Admitting: Student in an Organized Health Care Education/Training Program

## 2020-07-21 DIAGNOSIS — L309 Dermatitis, unspecified: Secondary | ICD-10-CM

## 2020-07-24 ENCOUNTER — Other Ambulatory Visit: Payer: Self-pay | Admitting: Student in an Organized Health Care Education/Training Program

## 2020-07-24 MED FILL — TRIAMCINOLONE 0.5% CREAM: 0.5 | 15 days supply | Qty: 30 | Fill #0

## 2020-07-25 DIAGNOSIS — Z23 Encounter for immunization: Secondary | ICD-10-CM | POA: Diagnosis not present

## 2020-07-31 ENCOUNTER — Other Ambulatory Visit: Payer: Self-pay | Admitting: Student in an Organized Health Care Education/Training Program

## 2020-07-31 DIAGNOSIS — C61 Malignant neoplasm of prostate: Secondary | ICD-10-CM | POA: Insufficient documentation

## 2020-07-31 HISTORY — DX: Malignant neoplasm of prostate: C61

## 2020-07-31 MED FILL — CARVEDILOL 3.125 MG TABLET: 3.125 | 90 days supply | Qty: 180 | Fill #0

## 2020-07-31 MED FILL — SELENIUM SULF 2.5% LOTION: 2.5 | 30 days supply | Qty: 120 | Fill #2

## 2020-08-17 DIAGNOSIS — R972 Elevated prostate specific antigen [PSA]: Secondary | ICD-10-CM | POA: Diagnosis not present

## 2020-08-17 DIAGNOSIS — C61 Malignant neoplasm of prostate: Secondary | ICD-10-CM | POA: Diagnosis not present

## 2020-08-30 ENCOUNTER — Other Ambulatory Visit (HOSPITAL_COMMUNITY): Payer: Self-pay | Admitting: Urology

## 2020-08-30 ENCOUNTER — Other Ambulatory Visit: Payer: Self-pay | Admitting: Urology

## 2020-08-30 DIAGNOSIS — C61 Malignant neoplasm of prostate: Secondary | ICD-10-CM

## 2020-09-12 ENCOUNTER — Encounter (HOSPITAL_COMMUNITY)
Admission: RE | Admit: 2020-09-12 | Discharge: 2020-09-12 | Disposition: A | Discharge status: Home / Self Care | Payer: Medicare Other | Source: Ambulatory Visit | Attending: Urology | Admitting: Urology

## 2020-09-12 ENCOUNTER — Other Ambulatory Visit: Payer: Self-pay

## 2020-09-12 DIAGNOSIS — C61 Malignant neoplasm of prostate: Secondary | ICD-10-CM | POA: Insufficient documentation

## 2020-09-12 MED ORDER — TECHNETIUM TC 99M MEDRONATE IV KIT
21.7000 | PACK | Freq: Once | INTRAVENOUS | Status: AC
  Administered 2020-09-12: 21.7 via INTRAVENOUS

## 2020-09-18 MED FILL — TRIAMCINOLONE 0.5% CREAM: 0.5 | 15 days supply | Qty: 30 | Fill #1

## 2020-09-18 MED FILL — SELENIUM SULF 2.5% LOTION: 2.5 | 30 days supply | Qty: 120 | Fill #3

## 2020-10-04 DIAGNOSIS — Z8042 Family history of malignant neoplasm of prostate: Secondary | ICD-10-CM | POA: Diagnosis not present

## 2020-10-04 DIAGNOSIS — C61 Malignant neoplasm of prostate: Secondary | ICD-10-CM | POA: Diagnosis not present

## 2020-10-09 DIAGNOSIS — C61 Malignant neoplasm of prostate: Secondary | ICD-10-CM | POA: Diagnosis not present

## 2020-11-01 ENCOUNTER — Other Ambulatory Visit: Payer: Self-pay | Admitting: Student in an Organized Health Care Education/Training Program

## 2020-11-01 MED FILL — ATORVASTATIN CALCIUM 20 MG: 20 | 90 days supply | Qty: 90 | Fill #1

## 2020-11-01 MED FILL — AMLODIPINE BESYLATE 10 MG T: 10 | 90 days supply | Qty: 90 | Fill #1

## 2020-11-01 MED FILL — SPIRONOLACTONE 25 MG TABS: 25 | 90 days supply | Qty: 45 | Fill #0

## 2020-11-01 MED FILL — TRIAMCINOLONE 0.5% CREAM: 0.5 | 15 days supply | Qty: 30 | Fill #2

## 2020-11-01 MED FILL — LOSARTAN POTASSIUM 25 MG TA: 25 | 30 days supply | Qty: 30 | Fill #1

## 2020-11-06 ENCOUNTER — Encounter: Payer: Self-pay | Admitting: Radiation Oncology

## 2020-11-06 NOTE — Progress Notes (Addendum)
GU Location of Tumor / Histology: prostatic adenocarcinoma  If Prostate Cancer, Gleason Score is (4 + 5) and PSA is (71.30). Prostate volume: 33.12 g.  [name] presented with an elevated PSA and strong family history of prostate cancer. Patient's oldest brother died of advanced prostate cancer and youngest brother has been diagnosed with prostate cancer. In addition, patient's daughter was treated her for breast cancer.  Biopsies of prostate (if applicable) revealed:    Past/Anticipated interventions by urology, if any: prostate biopsy, bone scan (negative), referral to Dr. Tammi Klippel  Patient has not received ADT to date.  Past/Anticipated interventions by medical oncology, if any: no  Weight changes, if any: denies  Bowel/Bladder complaints, if any: IPSS 16. SHIM 22. Reports dysuria that is not improving. Reports nocturia.  Denies hematuria. Denies urinary leakage or incontinence. Denies any bowel complaints.  Nausea/Vomiting, if any: denies  Pain issues, if any:  denies  SAFETY ISSUES:  Prior radiation? denies  Pacemaker/ICD? denies  Possible current pregnancy? no, male patient  Is the patient on methotrexate? denies  Current Complaints / other details:  65 year old male. Single. 2 sons and 5 daughters. Retired Administrator. Resides in [place].   Daughter with breast cancer. Brothers with prostate cancer. Genetic testing needed.

## 2020-11-07 ENCOUNTER — Encounter: Payer: Self-pay | Admitting: Medical Oncology

## 2020-11-07 ENCOUNTER — Encounter: Payer: Self-pay | Admitting: Radiation Oncology

## 2020-11-07 ENCOUNTER — Ambulatory Visit
Admission: RE | Admit: 2020-11-07 | Discharge: 2020-11-07 | Disposition: A | Discharge status: Home / Self Care | Payer: Medicare Other | Source: Ambulatory Visit | Attending: Radiation Oncology | Admitting: Radiation Oncology

## 2020-11-07 ENCOUNTER — Other Ambulatory Visit: Payer: Self-pay

## 2020-11-07 ENCOUNTER — Telehealth: Payer: Self-pay | Admitting: *Deleted

## 2020-11-07 VITALS — BP 163/9 | HR 73 | Temp 97.4°F | Resp 18 | Ht 72.0 in | Wt 199.4 lb

## 2020-11-07 DIAGNOSIS — F1010 Alcohol abuse, uncomplicated: Secondary | ICD-10-CM | POA: Diagnosis not present

## 2020-11-07 DIAGNOSIS — Z8042 Family history of malignant neoplasm of prostate: Secondary | ICD-10-CM | POA: Insufficient documentation

## 2020-11-07 DIAGNOSIS — Z803 Family history of malignant neoplasm of breast: Secondary | ICD-10-CM | POA: Diagnosis not present

## 2020-11-07 DIAGNOSIS — I5042 Chronic combined systolic (congestive) and diastolic (congestive) heart failure: Secondary | ICD-10-CM | POA: Insufficient documentation

## 2020-11-07 DIAGNOSIS — C61 Malignant neoplasm of prostate: Secondary | ICD-10-CM | POA: Diagnosis not present

## 2020-11-07 DIAGNOSIS — I110 Hypertensive heart disease with heart failure: Secondary | ICD-10-CM | POA: Insufficient documentation

## 2020-11-07 DIAGNOSIS — Z79899 Other long term (current) drug therapy: Secondary | ICD-10-CM | POA: Insufficient documentation

## 2020-11-07 DIAGNOSIS — Z87891 Personal history of nicotine dependence: Secondary | ICD-10-CM | POA: Diagnosis not present

## 2020-11-07 DIAGNOSIS — R972 Elevated prostate specific antigen [PSA]: Secondary | ICD-10-CM | POA: Diagnosis not present

## 2020-11-07 HISTORY — DX: Malignant neoplasm of prostate: C61

## 2020-11-07 NOTE — Progress Notes (Signed)
Radiation Oncology         [phone]) [phone] ________________________________  Initial outpatient Consultation  Name: [name] MRN: [id number]  Date: 11/07/2020  DOB: [birth year]  CC:Richarda Osmond, DO  Alexis Frock, MD   REFERRING PHYSICIAN: Alexis Frock, MD  DIAGNOSIS: 65 y.o. gentleman with Stage T1c adenocarcinoma of the prostate with Gleason score of 4+5, and PSA of 71.3.    ICD-10-CM   1. Malignant neoplasm of prostate ([place])  C61     HISTORY OF PRESENT ILLNESS: [name] is a 65 y.o. male with a diagnosis of prostate cancer. He was noted to have an elevated PSA of 101 by his primary care physician, Dr. Ouida Sills.  Accordingly, he was referred for evaluation in urology by Dr. Milford Cage on 07/19/2020,  digital rectal examination was performed at that time revealing some firmness with no distinct nodularity or induration. Repeat PSA obtained that day showed a decline to 71.3. The patient proceeded to transrectal ultrasound with 12 biopsies of the prostate on 08/17/2020.  The prostate volume measured 33.12 cc.  Out of 12 core biopsies, all 12 were positive.  The maximum Gleason score was 4+5, and this was seen in LBL, LML, LAL (with PNI), LB, RB (with PNI), and RA. Additionally, Gleason 4+3 was seen in RAL (with EPE), RBL, RM, LA, and LM, all with PNI. Gleason 3+4 was noted in RML, also with PNI.  He proceeded to staging CT and bone scan on 09/12/2020. CT A/P showed: no findings highly suspicious for metastatic disease; stable mild bilateral external iliac lymphadenopathy since 09/2012, more likely benign; mildly enlarged prostate. Bone scan was negative for osseous metastatic disease.  He met with Dr. Tresa Moore on 10/09/2020 to discuss prostatectomy. Per his note, Dr. Tresa Moore indicates that the patient may be at more risk with surgery given his CV history. The patient would also likely require postoperative radiation therapy in light of the amount of disease and noted extraprostatic  extension. With this information, the patient appeared to be leaning towards radiation therapy as his primary treatment.  The patient reviewed the biopsy results with his urologist and he has kindly been referred today for discussion of potential radiation treatment options. He has not yet received ADT.   PREVIOUS RADIATION THERAPY: No  PAST MEDICAL HISTORY:  Past Medical History:  Diagnosis Date  . Alcohol abuse    still drink beer  . CHF (congestive heart failure) ([place])   . Chronic combined systolic and diastolic heart failure, NYHA class 3 ([place]) 06/02/2012  . Chronic systolic heart failure ([place]) 06/02/2012  . Hypertension   . Need for prophylactic vaccination and inoculation against single disease 04/15/2018  . NICM (nonischemic cardiomyopathy) ([place]) 09/04/2012  . Prostate cancer ([place])   . Psoriasis    Possible psoriasis in the past  . Pulmonary nodule, right 11/03/2012      PAST SURGICAL HISTORY: Past Surgical History:  Procedure Laterality Date  . LEFT AND RIGHT HEART CATHETERIZATION WITH CORONARY ANGIOGRAM N/A 05/25/2012   Procedure: LEFT AND RIGHT HEART CATHETERIZATION WITH CORONARY ANGIOGRAM;  Surgeon: Jolaine Artist, MD;  Location: [facility] CATH LAB;  Service: Cardiovascular;  Laterality: N/A;  . SKIN GRAFT     L arm got caught in machine in 1979    FAMILY HISTORY:  Family History  Problem Relation Age of Onset  . Heart failure Father   . Heart failure Other        Uncle. Possible MI?  Marland Kitchen Cancer Sister  Skin  . Cancer Brother   . Prostate cancer Brother   . Colon polyps Brother   . Colon polyps Brother   . Prostate cancer Brother   . Breast cancer Daughter   . Colon cancer Neg Hx   . Esophageal cancer Neg Hx   . Rectal cancer Neg Hx   . Stomach cancer Neg Hx     SOCIAL HISTORY:  Social History   Socioeconomic History  . Marital status: Legally Separated    Spouse name: Not on file  . Number of children: 7  . Years of education: 65  . Highest  education level: 12th grade  Occupational History    Comment: Retired. Formerly disabled due to heart failure.  Tobacco Use  . Smoking status: Former Smoker    Years: 3.00    Types: Cigars    Quit date: 01/01/2012    Years since quitting: 8.8  . Smokeless tobacco: Never Used  Vaping Use  . Vaping Use: Never used  Substance and Sexual Activity  . Alcohol use: Yes    Alcohol/week: 0.0 standard drinks    Comment: 2-3 beers 3-4 days per week  . Drug use: No  . Sexual activity: Yes  Other Topics Concern  . Not on file  Social History Narrative   Retired/disabled from trucking.   Lives in home but one son and girlfriend with 4 granddaughters live with him "temporarily".      Mobile home with some steps, has handrails. Non-working smoke alarms.    Likes to eat meat, pasta, vegetables, and fruit. Sweet tea is what he likes to drink, V8 juice.      Loves to sing, sudoku puzzles, facebook.      Wears seat belt in car.   Social Determinants of Health   Financial Resource Strain: Not on file  Food Insecurity: Not on file  Transportation Needs: Not on file  Physical Activity: Not on file  Stress: Not on file  Social Connections: Not on file  Intimate Partner Violence: Not on file    ALLERGIES: Patient has no known allergies.  MEDICATIONS:  Current Outpatient Medications  Medication Sig Dispense Refill  . amLODipine (NORVASC) 10 MG tablet TAKE 1 TABLET (10 MG TOTAL) BY MOUTH DAILY. 90 tablet 1  . atorvastatin (LIPITOR) 20 MG tablet TAKE 1 TABLET (20 MG TOTAL) BY MOUTH DAILY. 90 tablet 1  . carvedilol (COREG) 3.125 MG tablet TAKE 1 TABLET BY MOUTH TWICE DAILY WITH MEAL. 180 tablet 1  . losartan (COZAAR) 25 MG tablet TAKE 1 TABLET (25 MG TOTAL) BY MOUTH AT BEDTIME. 90 tablet 1  . selenium sulfide (SELSUN) 2.5 % shampoo APPLY TOPICALLY DAILY AS NEEDED FOR IRRITATION. 118 mL 12  . spironolactone (ALDACTONE) 25 MG tablet TAKE 1/2 TABLET BY MOUTH AT BEDTIME 45 tablet 0  . triamcinolone  cream (KENALOG) 0.5 % APPLY TO THE AFFECTED AREAS DAILY AS NEEDED. 30 g 3  . nitroGLYCERIN (NITROSTAT) 0.4 MG SL tablet PLACE 1 TABLET UNDER THE TONGUE EVERY 5 MINUTES AS NEEDED FOR CHEST PAIN. (Patient not taking: Reported on 11/07/2020) 10 tablet 0   Current Facility-Administered Medications  Medication Dose Route Frequency Provider Last Rate Last Admin  . 0.9 %  sodium chloride infusion  500 mL Intravenous Continuous Pyrtle, Lajuan Lines, MD        REVIEW OF SYSTEMS:  On review of systems, the patient reports that he is doing well overall. He denies any chest pain, shortness of breath, cough, fevers, chills, night  sweats, unintended weight changes. He denies any bowel disturbances, and denies abdominal pain, nausea or vomiting. He denies any new musculoskeletal or joint aches or pains. His IPSS was 16, indicating moderate urinary symptoms. He reports dysuria that is not improving and nocturia. His SHIM was 22, indicating he does not have erectile dysfunction. A complete review of systems is obtained and is otherwise negative.    PHYSICAL EXAM:  Wt Readings from Last 3 Encounters:  11/07/20 199 lb 6 oz (90.4 kg)  06/02/20 183 lb 12.8 oz (83.4 kg)  12/14/19 196 lb 9.6 oz (89.2 kg)   Temp Readings from Last 3 Encounters:  11/07/20 (!) 97.4 F (36.3 C) (Temporal)  08/17/19 98.4 F (36.9 C) (Oral)  01/28/19 98.6 F (37 C) (Oral)   BP Readings from Last 3 Encounters:  11/07/20 (!) 163/9  06/02/20 (!) 146/80  12/14/19 (!) 110/58   Pulse Readings from Last 3 Encounters:  11/07/20 73  06/02/20 82  12/14/19 70   Pain Assessment Pain Score: 0-No pain/10  In general this is a well appearing man in no acute distress. He's alert and oriented x4 and appropriate throughout the examination. Cardiopulmonary assessment is negative for acute distress, and he exhibits normal effort.     KPS = 100  100 - Normal; no complaints; no evidence of disease. 90   - Able to carry on normal activity; minor  signs or symptoms of disease. 80   - Normal activity with effort; some signs or symptoms of disease. 39   - Cares for self; unable to carry on normal activity or to do active work. 60   - Requires occasional assistance, but is able to care for most of his personal needs. 50   - Requires considerable assistance and frequent medical care. 44   - Disabled; requires special care and assistance. 59   - Severely disabled; hospital admission is indicated although death not imminent. 28   - Very sick; hospital admission necessary; active supportive treatment necessary. 10   - Moribund; fatal processes progressing rapidly. 0     - Dead  Karnofsky DA, Abelmann [place], Craver LS and Burchenal [facility] [phone]) The use of the nitrogen mustards in the palliative treatment of carcinoma: with particular reference to bronchogenic carcinoma Cancer 1 634-56  LABORATORY DATA:  Lab Results  Component Value Date   WBC 6.5 06/02/2020   HGB 12.9 (L) 06/02/2020   HCT 38.7 06/02/2020   MCV 87 06/02/2020   PLT 192 04/14/2019   Lab Results  Component Value Date   NA 139 06/02/2020   K 4.5 06/02/2020   CL 103 06/02/2020   CO2 24 06/02/2020   Lab Results  Component Value Date   ALT 27 06/02/2020   AST 27 06/02/2020   ALKPHOS 74 06/02/2020   BILITOT 0.5 06/02/2020     RADIOGRAPHY: No results found.    IMPRESSION/PLAN: 1. 65 y.o. gentleman with Stage T1c adenocarcinoma of the prostate with Gleason Score of 4+5, and PSA of 71.3. We discussed the patient's workup and outlined the nature of prostate cancer in this setting. The patient's Gleason's score, and PSA put him into the very high risk group. Accordingly, he is eligible for a variety of potential treatment options including ADT in combination with 8 weeks of external radiation, 5 weeks of external radiation with an upfront brachytherapy boost, or prostatectomy. We discussed the available radiation techniques, and focused on the details and logistics of delivery.  We discussed and outlined the risks, benefits,  short and long-term effects associated with radiotherapy and compared and contrasted these with prostatectomy. We discussed some of the potential proven indications for postoperative radiotherapy including positive margins, extracapsular extension, and seminal vesicle involvement. We also talked about some of the other potential findings leading to a recommendation for radiotherapy including a non-zero postoperative PSA and positive lymph nodes. We discussed the role of SpaceOAR gel in reducing the rectal toxicity associated with radiotherapy. We also detailed the role of ADT in the treatment of high risk prostate cancer and outlined the associated side effects that could be expected with this therapy. He appears to have a good understanding of his disease and our treatment recommendations which are of curative intent.  He was encouraged to ask questions that were answered to his stated satisfaction.  At the conclusion of our conversation, the patient is interested in moving forward with long term ADT with brachytherapy boost and IMRT.  I spent 60 minutes on this encounter in preparation, face-to-face, documentation, orders and correspondence      Tyler Pita, MD  [place]: [phone]  Fax: [phone] [email or web address]  Skype  [email or web address]   This document serves as a record of services personally performed by Tyler Pita, MD and Freeman Caldron, PA-C. It was created on their behalf by Wilburn Mylar, a trained medical scribe. The creation of this record is based on the scribe's personal observations and the provider's statements to them. This document has been checked and approved by the attending provider.

## 2020-11-07 NOTE — Telephone Encounter (Signed)
CALLED PATIENT TO INFORM OF ADT APPT. WITH DR. Milford Cage ON 11-16-20  - ARRIVAL TIME- 7:45 AM, SPOKE WITH PATIENT AND HE IS AWARE OF THIS APPT.

## 2020-11-07 NOTE — Progress Notes (Signed)
Introduced myself as the prostate nurse navigator and discussed my role. He states he has a strong family history of prostate cancer, including his oldest brother who did not survive and his youngest brother. His daughter was diagnosed and treated for breast cancer in her early 61's. He has 7 children and we discussed genetic counseling. He met with Dr. Tresa Moore, to discuss surgery but after the  consult, he feels radiation might be the best option since he would need radiation post op. No barriers to care identified at this time. I gave him my business card and asked him to call me with questions or concerns. He voiced understanding.

## 2020-11-08 ENCOUNTER — Telehealth: Payer: Self-pay | Admitting: Oncology

## 2020-11-08 IMAGING — CR DG ANKLE COMPLETE 3+V*R*
3 series · 3 of 3 positions shown · non-contrast
Comparison: None.

CLINICAL DATA: Acute ankle pain

EXAM:
RIGHT ANKLE - COMPLETE 3+ VIEW

[ankle ap]
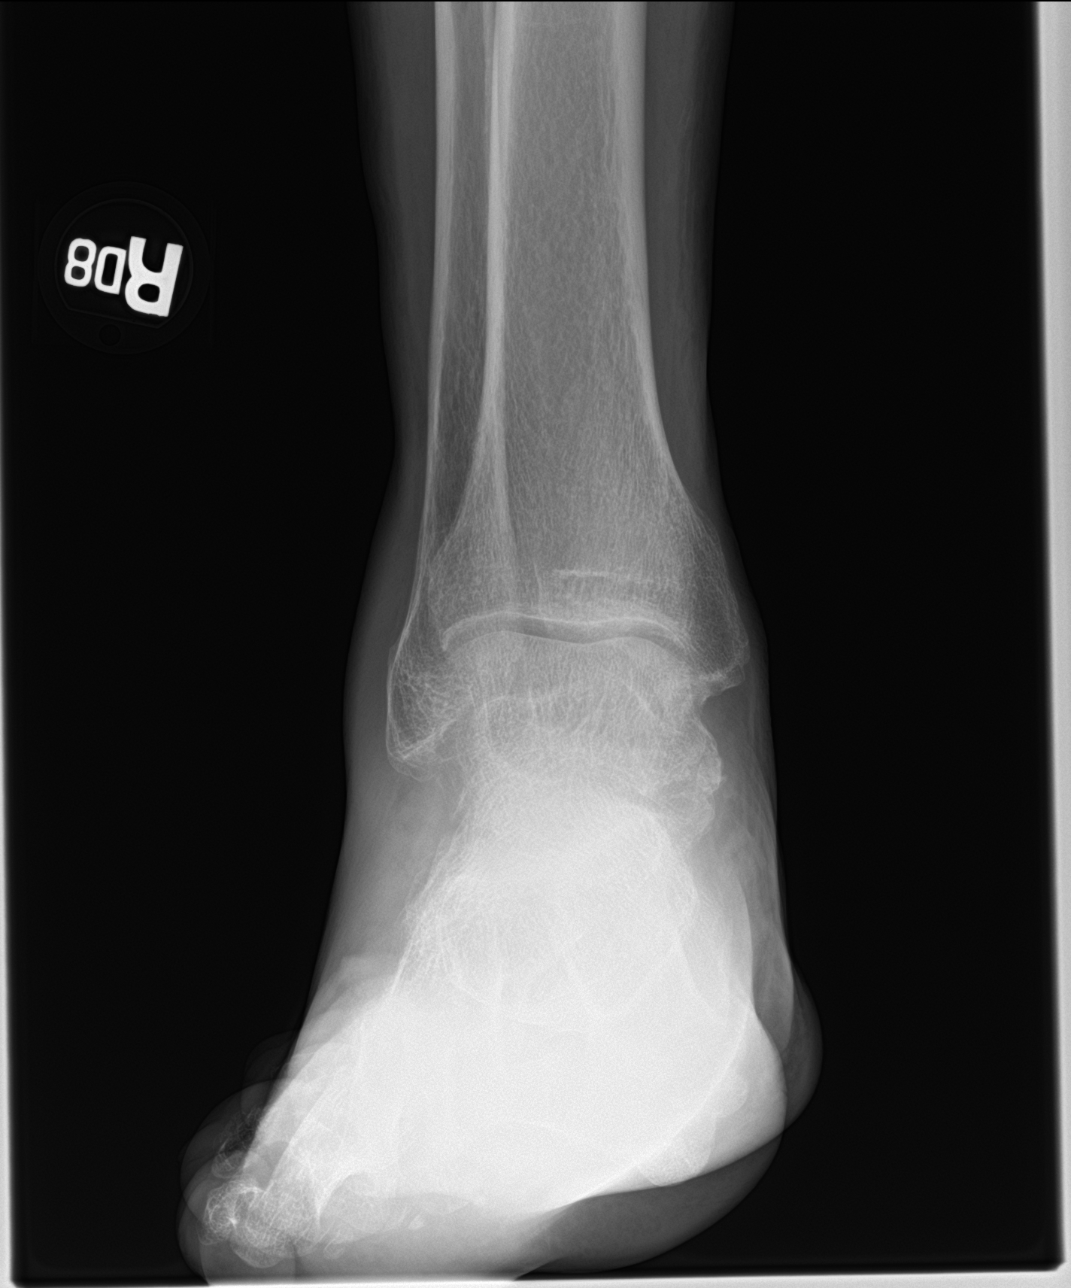

[ankle obl]
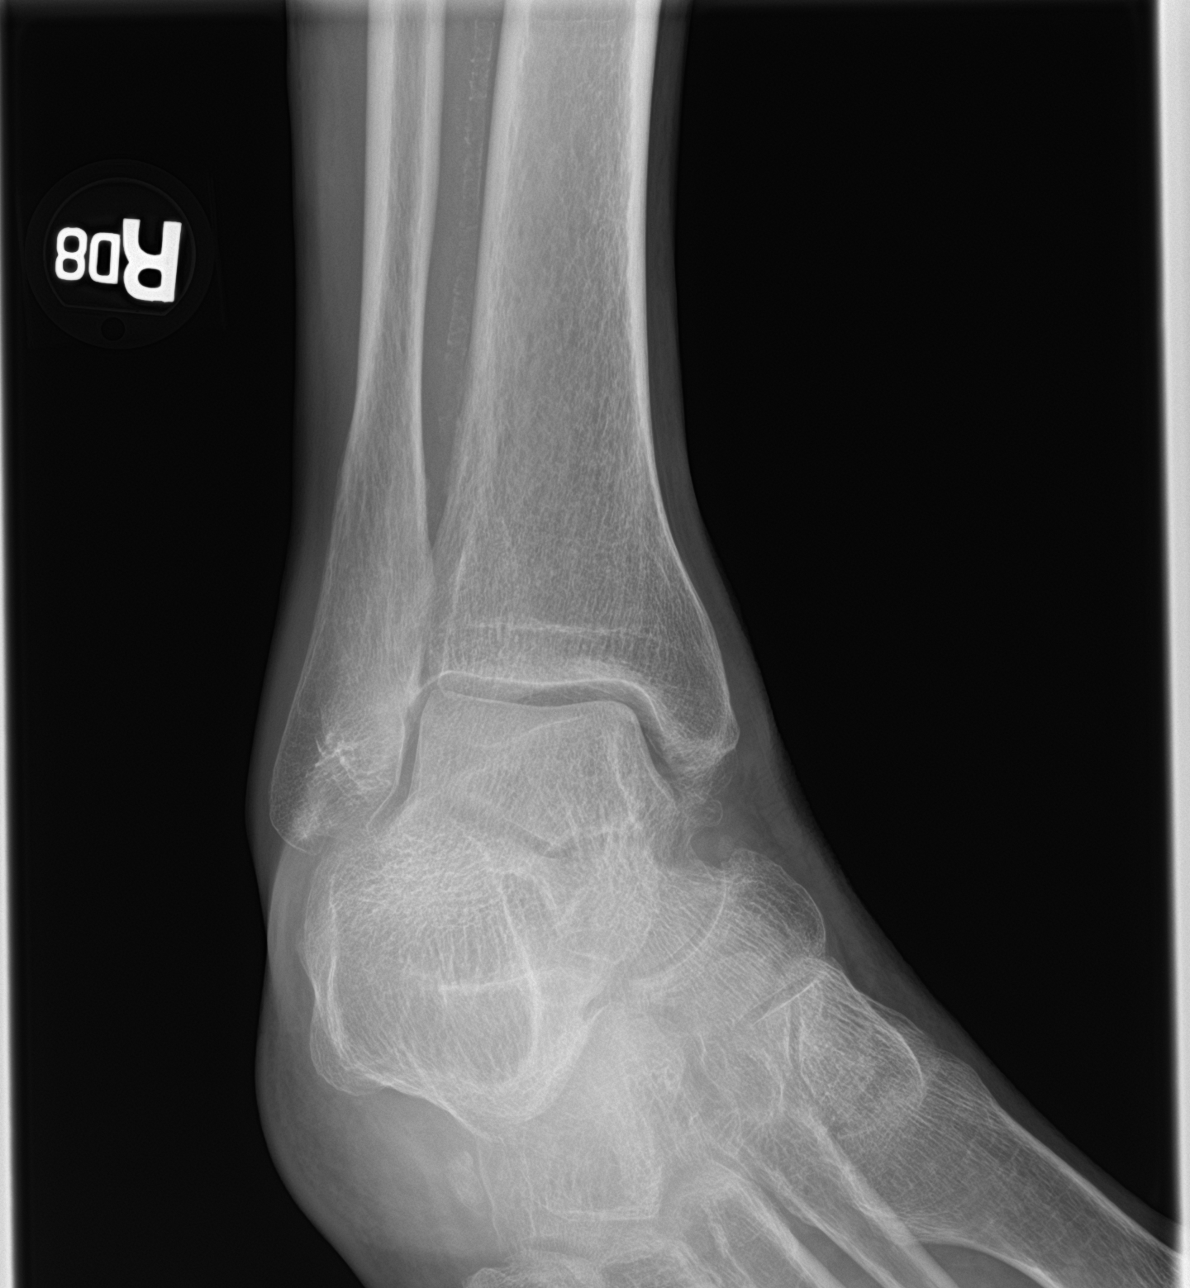

[ankle lat]
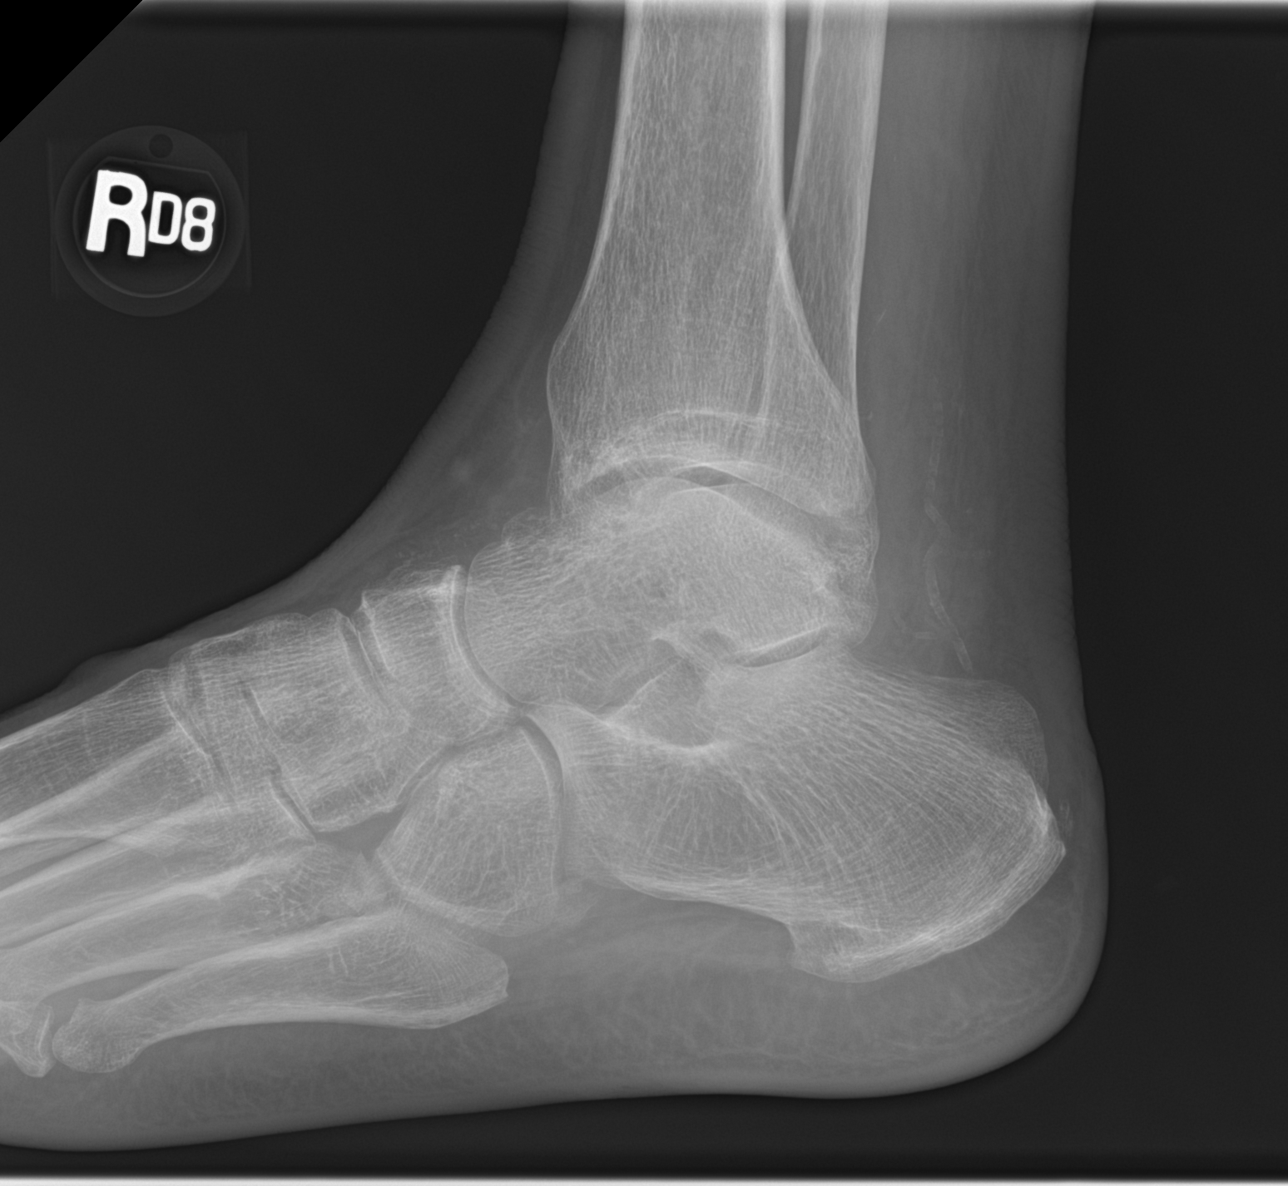

[3 of 3 positions shown; findings below may reference images not displayed]

FINDINGS: No fracture or malalignment. Soft tissue swelling is present. There
are vascular calcifications. Arthritis at the medial joint space.
IMPRESSION: No acute osseous abnormality

## 2020-11-08 NOTE — Telephone Encounter (Signed)
Received a new pt referral from Dr. Tammi Klippel for Mr. Ransdell to see Dr. Alen Blew for prostate cancer. Mr. Privott has been cld and scheduled to see Dr. Alen Blew on 2/24 at 2pm. Aware to arrive 15 minutes early.

## 2020-11-08 NOTE — Telephone Encounter (Signed)
Duplicate encounter

## 2020-11-16 ENCOUNTER — Encounter: Payer: Self-pay | Admitting: Medical Oncology

## 2020-11-16 DIAGNOSIS — C61 Malignant neoplasm of prostate: Secondary | ICD-10-CM | POA: Diagnosis not present

## 2020-11-21 ENCOUNTER — Telehealth: Payer: Self-pay | Admitting: *Deleted

## 2020-11-21 NOTE — Telephone Encounter (Signed)
PATIENT WILL HAVE ADT (LONG TERM) ON 12-11-20 @ [facility]

## 2020-11-23 ENCOUNTER — Telehealth: Payer: Self-pay | Admitting: Pharmacist

## 2020-11-23 ENCOUNTER — Telehealth: Payer: Self-pay

## 2020-11-23 ENCOUNTER — Other Ambulatory Visit: Payer: Self-pay | Admitting: Oncology

## 2020-11-23 ENCOUNTER — Inpatient Hospital Stay: Payer: Medicare Other | Attending: Oncology | Admitting: Oncology

## 2020-11-23 ENCOUNTER — Other Ambulatory Visit: Payer: Self-pay

## 2020-11-23 VITALS — BP 147/77 | HR 68 | Temp 97.4°F | Resp 18 | Ht 72.0 in | Wt 200.2 lb

## 2020-11-23 DIAGNOSIS — C61 Malignant neoplasm of prostate: Secondary | ICD-10-CM | POA: Diagnosis not present

## 2020-11-23 MED ORDER — PREDNISONE 5 MG PO TABS: 5.0000 mg | ORAL_TABLET | Freq: Every day | ORAL | 3 refills | Status: DC

## 2020-11-23 MED ORDER — ABIRATERONE ACETATE 250 MG PO TABS: 1000.0000 mg | ORAL_TABLET | Freq: Every day | ORAL | 0 refills | Status: DC

## 2020-11-23 MED FILL — predniSONE 5 MG TABS: 5 | 90 days supply | Qty: 90 | Fill #0

## 2020-11-23 NOTE — Telephone Encounter (Signed)
Oral Oncology Pharmacist Encounter  Received new prescription for Zytiga (abiraterone) for the treatment of high risk, CSPC in conjunction with prednisone and ADT, planned duration up to 2 years.  Labs from 06/02/2020 assessed, LFTs wnl and okay for initiating treatment. Prescription dose and frequency assessed.   Current medication list in Epic reviewed, possible DDIs with carvedilol and spironolactone identified. Abiraterone may increase serum concentration of carvedilol resulting in increased carvedilol effects. Patient's pulse was 68 today. No adjustments neccessary at this time, but would recommend monitoring pulse closely and decreasing carvedilol dose as necessary.   Of note, there are case reports describing decreased abiraterone efficacy with concurrent spironolactone use due to it's effect on androgen receptor modulation. No changes to therapy recommended at this time, but consider change in aldosterone antagonist if no or little response to therapy.  Evaluated chart and no patient barriers to medication adherence identified.   Patient agreed to treatment on 11/23/2020 per MD documentation.  Prescription has been e-scribed to the [facility] for benefits analysis and approval.  Oral Oncology Clinic will continue to follow for insurance authorization, copayment issues, initial counseling and start date.  Eddie Candle, PharmD, BCPS PGY2 Hematology/Oncology Pharmacy Resident Oral Chemotherapy Navigation Clinic 11/23/2020 3:12 PM

## 2020-11-23 NOTE — Telephone Encounter (Signed)
Oral Oncology Patient Advocate Encounter  Prior Authorization for Fabio Asa has been approved.    PA# [id number] Effective dates: 11/23/20 through 11/23/21  Patients co-pay is $2222  Oral Oncology Clinic will continue to follow.   [place] Patient [place] Phone [phone] Fax [phone] 11/23/2020 2:33 PM

## 2020-11-23 NOTE — Telephone Encounter (Signed)
Oral Oncology Patient Advocate Encounter  Received notification from [place] that prior authorization for [name] is required.  PA submitted on CoverMyMeds Key [id number] Status is pending  Oral Oncology Clinic will continue to follow.  [name] Phone [phone] Fax [phone] 11/23/2020 2:24 PM

## 2020-11-23 NOTE — Progress Notes (Signed)
Reason for the request:    Prostate cancer  HPI: I was asked by Dr. Tammi Klippel to evaluate [name] for evaluation of prostate cancer.  Is a 65 year old man with a history of hypertension congestive heart failure and recent diagnosis of prostate cancer.  He was found to have an elevated PSA by his primary care physician on screening laboratory testing.  His PSA was 1 along and subsequently evaluated by Dr. Tresa Moore and repeat PSA was 71.3.  He underwent prostate biopsy in November 2021 which showed a Gleason score of 4+5 = 9 in all 12 cores.  Staging scans in December 2021 showed no evidence of metastatic disease.  Treatment options were reviewed with him and opted against primary surgical therapy in lieu of of radiation and androgen deprivation.  He was evaluated by Dr. Tammi Klippel and opted to proceed IMRT radiation therapy and brachytherapy boost in addition to androgen deprivation therapy which will start soon under the care of Dr. Tresa Moore.  Clinically, he reports feeling reasonably well without any major complaints at this time.  He denies any nausea, vomiting or urinary frequency.  He denies any hematuria or dysuria.  He does not report any headaches, blurry vision, syncope or seizures. Does not report any fevers, chills or sweats.  Does not report any cough, wheezing or hemoptysis.  Does not report any chest pain, palpitation, orthopnea or leg edema.  Does not report any nausea, vomiting or abdominal pain.  Does not report any constipation or diarrhea.  Does not report any skeletal complaints.    Does not report frequency, urgency or hematuria.  Does not report any skin rashes or lesions. Does not report any heat or cold intolerance.  Does not report any lymphadenopathy or petechiae.  Does not report any anxiety or depression.  Remaining review of systems is negative.    Past Medical History:  Diagnosis Date  . Alcohol abuse    still drink beer  . CHF (congestive heart failure) ([place])   . Chronic combined  systolic and diastolic heart failure, NYHA class 3 ([place]) 06/02/2012  . Chronic systolic heart failure ([place]) 06/02/2012  . Hypertension   . Need for prophylactic vaccination and inoculation against single disease 04/15/2018  . NICM (nonischemic cardiomyopathy) ([place]) 09/04/2012  . Prostate cancer ([place])   . Psoriasis    Possible psoriasis in the past  . Pulmonary nodule, right 11/03/2012  :  Past Surgical History:  Procedure Laterality Date  . LEFT AND RIGHT HEART CATHETERIZATION WITH CORONARY ANGIOGRAM N/A 05/25/2012   Procedure: LEFT AND RIGHT HEART CATHETERIZATION WITH CORONARY ANGIOGRAM;  Surgeon: Jolaine Artist, MD;  Location: [facility] CATH LAB;  Service: Cardiovascular;  Laterality: N/A;  . SKIN GRAFT     L arm got caught in machine in 1979  :   Current Outpatient Medications:  .  amLODipine (NORVASC) 10 MG tablet, TAKE 1 TABLET (10 MG TOTAL) BY MOUTH DAILY., Disp: 90 tablet, Rfl: 1 .  atorvastatin (LIPITOR) 20 MG tablet, TAKE 1 TABLET (20 MG TOTAL) BY MOUTH DAILY., Disp: 90 tablet, Rfl: 1 .  carvedilol (COREG) 3.125 MG tablet, TAKE 1 TABLET BY MOUTH TWICE DAILY WITH MEAL., Disp: 180 tablet, Rfl: 1 .  losartan (COZAAR) 25 MG tablet, TAKE 1 TABLET (25 MG TOTAL) BY MOUTH AT BEDTIME., Disp: 90 tablet, Rfl: 1 .  nitroGLYCERIN (NITROSTAT) 0.4 MG SL tablet, PLACE 1 TABLET UNDER THE TONGUE EVERY 5 MINUTES AS NEEDED FOR CHEST PAIN. (Patient not taking: Reported on 11/07/2020), Disp: 10 tablet, Rfl:  0 .  selenium sulfide (SELSUN) 2.5 % shampoo, APPLY TOPICALLY DAILY AS NEEDED FOR IRRITATION., Disp: 118 mL, Rfl: 12 .  spironolactone (ALDACTONE) 25 MG tablet, TAKE 1/2 TABLET BY MOUTH AT BEDTIME, Disp: 45 tablet, Rfl: 0 .  triamcinolone cream (KENALOG) 0.5 %, APPLY TO THE AFFECTED AREAS DAILY AS NEEDED., Disp: 30 g, Rfl: 3  Current Facility-Administered Medications:  .  0.9 %  sodium chloride infusion, 500 mL, Intravenous, Continuous, Pyrtle, Lajuan Lines, MD:  No Known Allergies:  Family History  Problem  Relation Age of Onset  . Heart failure Father   . Heart failure Other        Uncle. Possible MI?  Marland Kitchen Cancer Sister        Skin  . Cancer Brother   . Prostate cancer Brother   . Colon polyps Brother   . Colon polyps Brother   . Prostate cancer Brother   . Breast cancer Daughter   . Colon cancer Neg Hx   . Esophageal cancer Neg Hx   . Rectal cancer Neg Hx   . Stomach cancer Neg Hx   :  Social History   Socioeconomic History  . Marital status: Legally Separated    Spouse name: Not on file  . Number of children: 7  . Years of education: 68  . Highest education level: 12th grade  Occupational History    Comment: Retired. Formerly disabled due to heart failure.  Tobacco Use  . Smoking status: Former Smoker    Years: 3.00    Types: Cigars    Quit date: 01/01/2012    Years since quitting: 8.9  . Smokeless tobacco: Never Used  Vaping Use  . Vaping Use: Never used  Substance and Sexual Activity  . Alcohol use: Yes    Alcohol/week: 0.0 standard drinks    Comment: 2-3 beers 3-4 days per week  . Drug use: No  . Sexual activity: Yes  Other Topics Concern  . Not on file  Social History Narrative   Retired/disabled from trucking.   Lives in home but one son and girlfriend with 4 granddaughters live with him "temporarily".      Mobile home with some steps, has handrails. Non-working smoke alarms.    Likes to eat meat, pasta, vegetables, and fruit. Sweet tea is what he likes to drink, V8 juice.      Loves to sing, sudoku puzzles, facebook.      Wears seat belt in car.   Social Determinants of Health   Financial Resource Strain: Not on file  Food Insecurity: Not on file  Transportation Needs: Not on file  Physical Activity: Not on file  Stress: Not on file  Social Connections: Not on file  Intimate Partner Violence: Not on file  :  Pertinent items are noted in HPI.  Exam: Blood pressure (!) 147/77, pulse 68, temperature (!) 97.4 F (36.3 C), temperature source  Tympanic, resp. rate 18, height 6' (1.829 m), weight 200 lb 3.2 oz (90.8 kg), SpO2 99 %.   ECOG 0 General appearance: alert and cooperative appeared without distress. Head: atraumatic without any abnormalities. Eyes: conjunctivae/corneas clear. PERRL.  Sclera anicteric. Throat: lips, mucosa, and tongue normal; without oral thrush or ulcers. Resp: clear to auscultation bilaterally without rhonchi, wheezes or dullness to percussion. Cardio: regular rate and rhythm, S1, S2 normal, no murmur, click, rub or gallop GI: soft, non-tender; bowel sounds normal; no masses,  no organomegaly Skin: Skin color, texture, turgor normal. No rashes or lesions Lymph  nodes: Cervical, supraclavicular, and axillary nodes normal. Neurologic: Grossly normal without any motor, sensory or deep tendon reflexes. Musculoskeletal: No joint deformity or effusion.  CBC    Component Value Date/Time   WBC 6.5 06/02/2020 1124   WBC 4.7 01/09/2018 0838   RBC 4.47 06/02/2020 1124   RBC 4.21 (L) 01/09/2018 0838   HGB 12.9 (L) 06/02/2020 1124   HCT 38.7 06/02/2020 1124   PLT 192 04/14/2019 1106   MCV 87 06/02/2020 1124   MCH 28.9 06/02/2020 1124   MCH 29.5 01/09/2018 0838   MCHC 33.3 06/02/2020 1124   MCHC 32.5 01/09/2018 0838   RDW 13.5 06/02/2020 1124   LYMPHSABS 1.1 06/02/2020 1124   MONOABS 1.1 (H) 04/09/2016 1107   EOSABS 0.2 06/02/2020 1124   BASOSABS 0.1 06/02/2020 1124     Chemistry      Component Value Date/Time   NA 139 06/02/2020 1124   K 4.5 06/02/2020 1124   CL 103 06/02/2020 1124   CO2 24 06/02/2020 1124   BUN 17 06/02/2020 1124   CREATININE 1.24 06/02/2020 1124   CREATININE 1.35 (H) 03/22/2016 1127      Component Value Date/Time   CALCIUM 9.0 06/02/2020 1124   ALKPHOS 74 06/02/2020 1124   AST 27 06/02/2020 1124   ALT 27 06/02/2020 1124   BILITOT 0.5 06/02/2020 1124       Assessment and Plan:   65 year old man with:  1.  Prostate cancer diagnosed in November 2021.  He presented  with high risk disease including Gleason score of 4+5 = 9, PSA of 71 and stage T1c disease.  His staging work-up did not show any evidence of metastatic disease by conventional imaging including CT scan and bone scan.  His disease status was discussed today and treatment options were reviewed.  The role of PSMA pet imaging was discussed in this particular setting given his high risk disease but it is unclear whether it will radically change his treatment options.  At this time, treatment options include radical prostatectomy versus radiation therapy with long-term ADT.  He opted against surgery and proceed with the latter approach.  The rationale for therapy escalation in this particular setting including adding abiraterone in addition to androgen deprivation was discussed.  This is based on the rationale of the STAMPEDE trial that included high risk M0 prostate cancer and showed benefit of ending abiraterone 2 androgen deprivation in this setting.  Given his extremely high risk status, I am in favor of starting this therapy at this time.  Complication associated with this treatment include nausea, abdominal pain, edema, hypertension, hypokalemia and adrenal insufficiency.  Low dose of prednisone 5 mg daily will be prescribed with that as well.  Alternative options would be to proceed with androgen deprivation therapy alone.  The duration of therapy will be for 2 years.  After discussion today, he is agreeable to proceed with Zytiga 1000 mg daily with prednisone at 5 mg.   2.  Androgen deprivation therapy: He will proceed with that under the care of Dr. Tresa Moore for 18 to 24 months.  He already started therapy in February 2022.  Next injection will be in March.  3.  Hypertension: His blood pressure will need to be monitored closely given his cardiovascular disease and Zytiga.  4.  Polysubstance use: He is currently drinking 3 beers a day.  I advised to abstain completely at this time given his ongoing  cancer treatment specifically with Zytiga and prednisone.  5.  Follow-up: Will  be in the next few months to follow his progress.   60  minutes were dedicated to this visit. The time was spent on reviewing laboratory data, imaging studies, discussing treatment options, and answering questions regarding future plan.     A copy of this consult has been forwarded to the requesting physician.

## 2020-11-28 ENCOUNTER — Telehealth: Payer: Self-pay

## 2020-11-29 MED FILL — SELENIUM SULF 2.5% LOTION: 2.5 | 30 days supply | Qty: 120 | Fill #4

## 2020-12-05 NOTE — Telephone Encounter (Signed)
Oral Oncology Patient Advocate Encounter  Met patient in lobby to complete application for [place]etta Emery and [place] in an effort to reduce patient's out of pocket expense for Zytiga to $0.    Application completed and faxed to [phone].   [employer] patient assistance phone number for follow up is [phone].   This encounter will be updated until final determination.  [place] Patient [place] Phone [phone] Fax [phone] 12/05/2020 10:37 AM

## 2020-12-11 DIAGNOSIS — C61 Malignant neoplasm of prostate: Secondary | ICD-10-CM | POA: Diagnosis not present

## 2020-12-12 NOTE — Telephone Encounter (Signed)
Patient is approved for Zytiga at no cost from [place] [facility] 12/12/20-02/10/21.   Patient must sign a Medicare attestation letter to be approved until 09/29/21.  [facility] uses [place] [facility] Patient [place] Phone [phone] Fax [phone] 12/12/2020 11:33 AM

## 2020-12-13 ENCOUNTER — Telehealth: Payer: Self-pay | Admitting: *Deleted

## 2020-12-13 NOTE — Telephone Encounter (Signed)
CALLED PATIENT TO REMIND OF PRE-SEED APPTS. FOR 12-14-20, SPOKE WITH PATIENT AND HE IS AWARE OF THESE APPTS.

## 2020-12-13 NOTE — Telephone Encounter (Signed)
Oral Chemotherapy Pharmacist Encounter  I spoke with patient for overview of: Zytiga for the treatment of high risk, castration-sensitive prostate cancer in conjunction with prednisone, planned duration up to 2 years.  Counseled patient on administration, dosing, side effects, monitoring, drug-food interactions, safe handling, storage, and disposal.  Patient will take Zytiga 250mg  tablets, 4 tablets (1000mg ) by mouth once daily on an empty stomach, 1 hour before or 2 hours after a meal.  Patient states he will take his Zytiga when he wakes up in the morning and will wait at least 1 hour before eating.  Patient will take prednisone 5mg  tablet, 1 tablet by mouth one daily with breakfast.  Zytiga start date: 12/16/20  Adverse effects include but are not limited to: peripheral edema, GI upset, hypertension, hot flashes, fatigue, and arthralgias.    Prednisone prescription has been sent to [facility] on 11/23/20. Patient knows to start prednisone on the same day as Zytiga start.  Reviewed with patient importance of keeping a medication schedule and plan for any missed doses. No barriers to medication adherence identified.  Medication reconciliation performed and medication/allergy list updated.  Patient approved to receive Zytiga at no cost through [employer]. Phone number provided to patient to set up medication shipment [phone]). Patient stated that medication will be delivered to patient's home on Friday, 12/15/20.  All questions answered.  Mr. Dunaj voiced understanding and appreciation.   Medication education handout placed in mail for patient. Patient knows to call the office with questions or concerns. Oral Chemotherapy Clinic phone number provided to patient.   Leron Croak, PharmD, BCPS Hematology/Oncology Clinical Pharmacist [facility] [phone] 12/13/2020 2:14 PM

## 2020-12-14 ENCOUNTER — Ambulatory Visit
Admission: RE | Admit: 2020-12-14 | Discharge: 2020-12-14 | Disposition: A | Discharge status: Home / Self Care | Payer: Medicare Other | Source: Ambulatory Visit | Attending: Radiation Oncology | Admitting: Radiation Oncology

## 2020-12-14 ENCOUNTER — Other Ambulatory Visit: Payer: Self-pay

## 2020-12-14 ENCOUNTER — Encounter (HOSPITAL_COMMUNITY)
Admission: RE | Admit: 2020-12-14 | Discharge: 2020-12-14 | Disposition: A | Discharge status: Home / Self Care | Payer: Medicare Other | Source: Ambulatory Visit | Attending: Urology | Admitting: Urology

## 2020-12-14 ENCOUNTER — Ambulatory Visit (HOSPITAL_COMMUNITY)
Admission: RE | Admit: 2020-12-14 | Discharge: 2020-12-14 | Disposition: A | Discharge status: Home / Self Care | Payer: Medicare Other | Source: Ambulatory Visit | Attending: Urology | Admitting: Urology

## 2020-12-14 ENCOUNTER — Ambulatory Visit
Admission: RE | Admit: 2020-12-14 | Discharge: 2020-12-14 | Disposition: A | Discharge status: Home / Self Care | Payer: Medicare Other | Source: Ambulatory Visit | Attending: Urology | Admitting: Urology

## 2020-12-14 ENCOUNTER — Encounter: Payer: Self-pay | Admitting: Medical Oncology

## 2020-12-14 DIAGNOSIS — Z01818 Encounter for other preprocedural examination: Secondary | ICD-10-CM | POA: Diagnosis not present

## 2020-12-14 DIAGNOSIS — C61 Malignant neoplasm of prostate: Secondary | ICD-10-CM | POA: Diagnosis not present

## 2020-12-14 NOTE — Progress Notes (Signed)
  Radiation Oncology         [phone]) [phone] ________________________________  Name: [name] MRN: [id number]  Date: 12/14/2020  DOB: 30-Mar-1956  SIMULATION AND TREATMENT PLANNING NOTE PUBIC ARCH STUDY  AY:TKZSWFUX, Carlynn Herald, DO  Alexis Frock, MD  DIAGNOSIS: 65 y.o. gentleman with Stage T1c adenocarcinoma of the prostate with Gleason score of 4+5, and PSA of 71.3.  Oncology History  Malignant neoplasm of prostate ([place])  11/07/2020 Initial Diagnosis   Malignant neoplasm of prostate ([place])   11/07/2020 Cancer Staging   Staging form: Prostate, AJCC 8th Edition - Clinical stage from 11/07/2020: Stage IIIC (cT1c, cN0, cM0, PSA: 71, Grade Group: 5) - Signed by Tyler Pita, MD on 11/07/2020 Histopathologic type: Adenocarcinoma, NOS Stage prefix: Initial diagnosis Prostate specific antigen (PSA) range: 20 or greater Gleason primary pattern: 4 Gleason secondary pattern: 5 Gleason score: 9 Histologic grading system: 5 grade system Number of biopsy cores examined: 12 Number of biopsy cores positive: 12 Location of positive needle core biopsies: Both sides       ICD-10-CM   1. Malignant neoplasm of prostate ([place])  C61     COMPLEX SIMULATION:  The patient presented today for evaluation for possible prostate seed implant. He was brought to the radiation planning suite and placed supine on the CT couch. A 3-dimensional image study set was obtained in upload to the planning computer. There, on each axial slice, I contoured the prostate gland. Then, using three-dimensional radiation planning tools I reconstructed the prostate in view of the structures from the transperineal needle pathway to assess for possible pubic arch interference. In doing so, I did not appreciate any pubic arch interference. Also, the patient's prostate volume was estimated based on the drawn structure. The volume was 29 cc.  Given the pubic arch appearance and prostate volume, patient remains a good candidate to proceed  with prostate seed implant. Today, he freely provided informed written consent to proceed.    PLAN: The patient will undergo prostate seed implant.   ________________________________  Sheral Apley. Tammi Klippel, M.D.

## 2020-12-18 ENCOUNTER — Other Ambulatory Visit: Payer: Self-pay | Admitting: Urology

## 2020-12-18 DIAGNOSIS — C61 Malignant neoplasm of prostate: Secondary | ICD-10-CM | POA: Diagnosis not present

## 2020-12-18 DIAGNOSIS — Z5111 Encounter for antineoplastic chemotherapy: Secondary | ICD-10-CM | POA: Diagnosis not present

## 2020-12-19 ENCOUNTER — Other Ambulatory Visit: Payer: Self-pay | Admitting: *Deleted

## 2020-12-19 MED ORDER — ABIRATERONE ACETATE 250 MG PO TABS: 1000.0000 mg | ORAL_TABLET | Freq: Every day | ORAL | 0 refills | Status: DC

## 2020-12-26 ENCOUNTER — Other Ambulatory Visit: Payer: Self-pay | Admitting: Student in an Organized Health Care Education/Training Program

## 2020-12-26 DIAGNOSIS — I5022 Chronic systolic (congestive) heart failure: Secondary | ICD-10-CM

## 2020-12-26 MED FILL — CARVEDILOL 3.125 MG TABLET: 3.125 | 90 days supply | Qty: 180 | Fill #1

## 2020-12-26 MED FILL — LOSARTAN POTASSIUM 25 MG TA: 25 | 30 days supply | Qty: 30 | Fill #0

## 2021-01-07 MED FILL — Selenium Sulfide Lotion 2.5%: CUTANEOUS | 30 days supply | Qty: 120 | Fill #0 | Status: CN

## 2021-01-08 ENCOUNTER — Other Ambulatory Visit (HOSPITAL_COMMUNITY): Payer: Self-pay

## 2021-01-08 MED FILL — Selenium Sulfide Lotion 2.5%: CUTANEOUS | 30 days supply | Qty: 118 | Fill #0 | Status: AC

## 2021-01-09 ENCOUNTER — Other Ambulatory Visit (HOSPITAL_COMMUNITY): Payer: Self-pay

## 2021-01-11 ENCOUNTER — Other Ambulatory Visit: Payer: Self-pay | Admitting: Student in an Organized Health Care Education/Training Program

## 2021-01-11 ENCOUNTER — Other Ambulatory Visit (HOSPITAL_COMMUNITY): Payer: Self-pay

## 2021-01-11 DIAGNOSIS — L309 Dermatitis, unspecified: Secondary | ICD-10-CM

## 2021-01-11 MED ORDER — TRIAMCINOLONE ACETONIDE 0.5 % EX CREA
1.0000 "application " | TOPICAL_CREAM | Freq: Every day | CUTANEOUS | 3 refills | Status: DC
  Filled 2021-01-11: qty 30, 15d supply, fill #0
  Filled 2021-01-19: qty 30, 30d supply, fill #0
  Filled 2021-07-31: qty 30, 30d supply, fill #1
  Filled 2021-10-22: qty 30, 30d supply, fill #2
  Filled 2022-01-08: qty 30, 30d supply, fill #3

## 2021-01-12 ENCOUNTER — Other Ambulatory Visit: Payer: Self-pay

## 2021-01-12 ENCOUNTER — Inpatient Hospital Stay: Payer: Medicare Other | Attending: Oncology

## 2021-01-12 ENCOUNTER — Inpatient Hospital Stay (HOSPITAL_BASED_OUTPATIENT_CLINIC_OR_DEPARTMENT_OTHER): Payer: Medicare Other | Admitting: Oncology

## 2021-01-12 VITALS — BP 132/68 | HR 72 | Temp 97.4°F | Resp 20 | Ht 72.0 in | Wt 205.4 lb

## 2021-01-12 DIAGNOSIS — C61 Malignant neoplasm of prostate: Secondary | ICD-10-CM | POA: Diagnosis not present

## 2021-01-12 DIAGNOSIS — Z7952 Long term (current) use of systemic steroids: Secondary | ICD-10-CM | POA: Insufficient documentation

## 2021-01-12 LAB — CBC WITH DIFFERENTIAL (CANCER CENTER ONLY)
Abs Immature Granulocytes: 0.05 10*3/uL (ref 0.00–0.07)
Basophils Absolute: 0.1 10*3/uL (ref 0.0–0.1)
Basophils Relative: 1 %
Eosinophils Absolute: 0.2 10*3/uL (ref 0.0–0.5)
Eosinophils Relative: 3 %
HCT: 36.6 % — ABNORMAL LOW (ref 39.0–52.0)
Hemoglobin: 11.9 g/dL — ABNORMAL LOW (ref 13.0–17.0)
Immature Granulocytes: 1 %
Lymphocytes Relative: 12 %
Lymphs Abs: 1.1 10*3/uL (ref 0.7–4.0)
MCH: 28.5 pg (ref 26.0–34.0)
MCHC: 32.5 g/dL (ref 30.0–36.0)
MCV: 87.8 fL (ref 80.0–100.0)
Monocytes Absolute: 0.5 10*3/uL (ref 0.1–1.0)
Monocytes Relative: 6 %
Neutro Abs: 7 10*3/uL (ref 1.7–7.7)
Neutrophils Relative %: 77 %
Platelet Count: 227 10*3/uL (ref 150–400)
RBC: 4.17 MIL/uL — ABNORMAL LOW (ref 4.22–5.81)
RDW: 13.2 % (ref 11.5–15.5)
WBC Count: 8.9 10*3/uL (ref 4.0–10.5)
nRBC: 0 % (ref 0.0–0.2)

## 2021-01-12 LAB — CMP (CANCER CENTER ONLY)
ALT: 16 U/L (ref 0–44)
AST: 19 U/L (ref 15–41)
Albumin: 4.1 g/dL (ref 3.5–5.0)
Alkaline Phosphatase: 48 U/L (ref 38–126)
Anion gap: 12 (ref 5–15)
BUN: 33 mg/dL — ABNORMAL HIGH (ref 8–23)
CO2: 24 mmol/L (ref 22–32)
Calcium: 9.2 mg/dL (ref 8.9–10.3)
Chloride: 105 mmol/L (ref 98–111)
Creatinine: 1.74 mg/dL — ABNORMAL HIGH (ref 0.61–1.24)
GFR, Estimated: 43 mL/min — ABNORMAL LOW (ref >60)
Glucose, Bld: 107 mg/dL — ABNORMAL HIGH (ref 70–99)
Potassium: 4.6 mmol/L (ref 3.5–5.1)
Sodium: 141 mmol/L (ref 135–145)
Total Bilirubin: 0.5 mg/dL (ref 0.3–1.2)
Total Protein: 7.6 g/dL (ref 6.5–8.1)

## 2021-01-12 NOTE — Progress Notes (Signed)
Hematology and Oncology Follow Up Visit  [name] [id number] 1956-01-25 65 y.o. 01/12/2021 2:50 PM [name], Tony L, DO   Principle Diagnosis: 65 year old man with prostate cancer diagnosed in November 2021.  He presented with stage T1cadenocarcinoma of the prostate with Gleason score of 4+5, and PSA of71.3.   Prior Therapy:  He is status post a prostate biopsy which showed a Gleason score 4+5 = 08 August 2020.  Current therapy:  Androgen deprivation therapy under the care of Dr. Tresa [name].  Zytiga 1000 mg daily with prednisone started in February 2022.  He is under consideration to start radiation therapy with radioactive seed implantation is scheduled for January 23, 2021.  Interim History: [name] returns today for a follow-up.  Since the last visit,   he reports feeling well without any major complaints.  He denies any nausea vomiting or abdominal pain.  He denies any recent hospitalization or illnesses.  He continues to tolerate Zytiga prednisone without any complaints.  He denies any edema, fatigue or lightheadedness.     Medications: I have reviewed the patient's current medications.  Current Outpatient Medications  Medication Sig Dispense Refill  . abiraterone acetate (ZYTIGA) 250 MG tablet Take 4 tablets (1,000 mg total) by mouth daily. Take on an empty stomach 1 hour before or 2 hours after a meal 120 tablet 0  . amLODipine (NORVASC) 10 MG tablet TAKE 1 TABLET (10 MG TOTAL) BY MOUTH DAILY. 90 tablet 1  . atorvastatin (LIPITOR) 20 MG tablet TAKE 1 TABLET (20 MG TOTAL) BY MOUTH DAILY. 90 tablet 1  . carvedilol (COREG) 3.125 MG tablet TAKE 1 TABLET BY MOUTH TWICE DAILY WITH MEAL. 180 tablet 1  . ciprofloxacin (CIPRO) 500 MG tablet TAKE 1 TABLET BY MOUTH DAILY AS DIRECTED 4 tablet 0  . losartan (COZAAR) 25 MG tablet TAKE 1 TABLET (25 MG TOTAL) BY MOUTH AT BEDTIME. 90 tablet 0  . nitroGLYCERIN (NITROSTAT) 0.4 MG SL tablet PLACE 1 TABLET UNDER THE  TONGUE EVERY 5 MINUTES AS NEEDED FOR CHEST PAIN. (Patient not taking: Reported on 11/07/2020) 10 tablet 0  . predniSONE (DELTASONE) 5 MG tablet TAKE 1 TABLET BY MOUTH ONCE A DAY WITH BREAKFAST 90 tablet 3  . selenium sulfide (SELSUN) 2.5 % shampoo APPLY TOPICALLY DAILY AS NEEDED FOR IRRITATION. 120 mL 12  . spironolactone (ALDACTONE) 25 MG tablet TAKE 1/2 TABLET BY MOUTH AT BEDTIME 45 tablet 0  . triamcinolone cream (KENALOG) 0.5 % APPLY TO THE AFFECTED AREAS DAILY AS NEEDED. 30 g 3   Current Facility-Administered Medications  Medication Dose Route Frequency Provider Last Rate Last Admin  . 0.9 %  sodium chloride infusion  500 mL Intravenous Continuous Pyrtle, Lajuan Lines, MD         Allergies: No Known Allergies    Physical Exam: Blood pressure 132/68, pulse 72, temperature (!) 97.4 F (36.3 C), temperature source Tympanic, resp. rate 20, height 6' (1.829 m), weight 205 lb 6.4 oz (93.2 kg), SpO2 96 %.   ECOG:  1    General appearance: Comfortable appearing without any discomfort Head: Normocephalic without any trauma Oropharynx: Mucous membranes are moist and pink without any thrush or ulcers. Eyes: Pupils are equal and round reactive to light. Lymph nodes: No cervical, supraclavicular, inguinal or axillary lymphadenopathy.   Heart:regular rate and rhythm.  S1 and S2 without leg edema. Lung: Clear without any rhonchi or wheezes.  No dullness to percussion. Abdomin: Soft, nontender, nondistended with good bowel sounds.  No hepatosplenomegaly. Musculoskeletal:  No joint deformity or effusion.  Full range of motion noted. Neurological: No deficits noted on motor, sensory and deep tendon reflex exam. Skin: No petechial rash or dryness.  Appeared moist.     Lab Results: Lab Results  Component Value Date   WBC 6.5 06/02/2020   HGB 12.9 ([name]) 06/02/2020   HCT 38.7 06/02/2020   MCV 87 06/02/2020   PLT 192 04/14/2019     Chemistry      Component Value Date/Time   NA 139 06/02/2020  1124   K 4.5 06/02/2020 1124   CL 103 06/02/2020 1124   CO2 24 06/02/2020 1124   BUN 17 06/02/2020 1124   CREATININE 1.24 06/02/2020 1124   CREATININE 1.35 (H) 03/22/2016 1127      Component Value Date/Time   CALCIUM 9.0 06/02/2020 1124   ALKPHOS 74 06/02/2020 1124   AST 27 06/02/2020 1124   ALT 27 06/02/2020 1124   BILITOT 0.5 06/02/2020 1124       Impression and Plan:   65 year old man with:  1.    High risk localized prostate cancer diagnosed in November 2021.  He presented with Gleason score of 4+5 = 9, PSA of 71 and stage T1c disease.    His disease status was updated at this time and currently receiving definitive therapy with androgen deprivation, Zytiga and to start radiation therapy seed implants.  Risks and benefits of continuing Zytiga for the time being were discussed.  The plan is to complete 2 years of therapy.  Complication clinic hypertension, renal insufficiency and hypokalemia were reiterated.  He is agreeable to continue and the plan is to complete close to 2 years of therapy.   2.  Androgen deprivation therapy: He is currently on long-term androgen deprivation under the care of Dr. Tresa [name].  Plan is to complete total of 18 to 24 months.   3.  Hypertension: His blood pressure is within normal range at this time I will continue to monitor on Zytiga.  5.  Follow-up: In 3 months for repeat follow-up.   30  minutes were spent on this encounter.  The time was dedicated to reviewing laboratory testing, disease status update and future plan of care discussion.     Zola Button, MD 4/15/20222:50 PM

## 2021-01-13 LAB — PROSTATE-SPECIFIC AG, SERUM (LABCORP): Prostate Specific Ag, Serum: 0.4 ng/mL (ref 0.0–4.0)

## 2021-01-15 ENCOUNTER — Telehealth: Payer: Self-pay | Admitting: Oncology

## 2021-01-15 DIAGNOSIS — R8279 Other abnormal findings on microbiological examination of urine: Secondary | ICD-10-CM | POA: Diagnosis not present

## 2021-01-15 DIAGNOSIS — C61 Malignant neoplasm of prostate: Secondary | ICD-10-CM | POA: Diagnosis not present

## 2021-01-15 NOTE — Telephone Encounter (Signed)
Per 4/18 los. Patient Called & notified of appointment, Patient confirmed.

## 2021-01-16 ENCOUNTER — Telehealth: Payer: Self-pay | Admitting: *Deleted

## 2021-01-16 NOTE — Telephone Encounter (Signed)
CALLED PATIENT TO REMIND OF LABS AND COVID TESTING FOR 01-19-21, SPOKE WITH PATIENT AND HE IS AWARE OF THESE APPTS.

## 2021-01-18 ENCOUNTER — Other Ambulatory Visit: Payer: Self-pay

## 2021-01-18 ENCOUNTER — Other Ambulatory Visit (HOSPITAL_COMMUNITY): Payer: Self-pay

## 2021-01-18 ENCOUNTER — Encounter (HOSPITAL_BASED_OUTPATIENT_CLINIC_OR_DEPARTMENT_OTHER): Payer: Self-pay | Admitting: Urology

## 2021-01-18 NOTE — Progress Notes (Signed)
Spoke w/ via phone for pre-op interview---pt Lab needs dos---- none has lab appt 01-19-2021 1300 for cbc cmp pt pt              Lab results------ekg [id number] epic, chest xray 12-14-2020 epic COVID test ------01-19-2021 1355 Arrive at -------730 am 01-23-2021  NPO after MN NO Solid Food.  Clear liquids from MN until---630 am then npo Med rec completed Medications to take morning of surgery -----amlodipine,atorvastatin, carvedilol, abiraterone (zytiga), prednisone Diabetic medication -----n/a Patient instructed to bring photo id and insurance card day of surgery Patient aware to have Driver (ride ) / caregiver  Daughter ashley snipes   for 24 hours after surgery  Patient Special Instructions -----fleets enema am of sugrery Pre-Op special Istructions -----none Patient verbalized understanding of instructions that were given at this phone interview. Patient denies shortness of breath, chest pain, fever, cough at this phone interview.  lov [facility] clinic  01-08-2018 epic amy clegg np epic note says follow up in 6 months Echo 01-08-2018 epic , .Addendum: reviewed patient chart and history with dr Remo Lipps Gifford Shave mda, pt ok for [facility] barring any acute status changes per dr Remo Lipps turk mda.

## 2021-01-19 ENCOUNTER — Other Ambulatory Visit (HOSPITAL_COMMUNITY): Payer: Self-pay

## 2021-01-19 ENCOUNTER — Encounter (HOSPITAL_COMMUNITY)
Admission: RE | Admit: 2021-01-19 | Discharge: 2021-01-19 | Disposition: A | Discharge status: Home / Self Care | Payer: Medicare Other | Source: Ambulatory Visit | Attending: Urology | Admitting: Urology

## 2021-01-19 ENCOUNTER — Other Ambulatory Visit: Payer: Self-pay | Admitting: *Deleted

## 2021-01-19 ENCOUNTER — Other Ambulatory Visit (HOSPITAL_COMMUNITY)
Admission: RE | Admit: 2021-01-19 | Discharge: 2021-01-19 | Disposition: A | Discharge status: Home / Self Care | Payer: Medicare Other | Source: Ambulatory Visit | Attending: Urology | Admitting: Urology

## 2021-01-19 ENCOUNTER — Other Ambulatory Visit: Payer: Self-pay | Admitting: Pharmacist

## 2021-01-19 DIAGNOSIS — Z20822 Contact with and (suspected) exposure to covid-19: Secondary | ICD-10-CM | POA: Diagnosis not present

## 2021-01-19 DIAGNOSIS — Z01812 Encounter for preprocedural laboratory examination: Secondary | ICD-10-CM | POA: Insufficient documentation

## 2021-01-19 DIAGNOSIS — C61 Malignant neoplasm of prostate: Secondary | ICD-10-CM

## 2021-01-19 LAB — CBC
HCT: 39.9 % (ref 39.0–52.0)
Hemoglobin: 13 g/dL (ref 13.0–17.0)
MCH: 29 pg (ref 26.0–34.0)
MCHC: 32.6 g/dL (ref 30.0–36.0)
MCV: 89.1 fL (ref 80.0–100.0)
Platelets: 217 10*3/uL (ref 150–400)
RBC: 4.48 MIL/uL (ref 4.22–5.81)
RDW: 13.4 % (ref 11.5–15.5)
WBC: 8.7 10*3/uL (ref 4.0–10.5)
nRBC: 0 % (ref 0.0–0.2)

## 2021-01-19 LAB — COMPREHENSIVE METABOLIC PANEL
ALT: 42 U/L (ref 0–44)
AST: 45 U/L — ABNORMAL HIGH (ref 15–41)
Albumin: 4.3 g/dL (ref 3.5–5.0)
Alkaline Phosphatase: 43 U/L (ref 38–126)
Anion gap: 9 (ref 5–15)
BUN: 27 mg/dL — ABNORMAL HIGH (ref 8–23)
CO2: 25 mmol/L (ref 22–32)
Calcium: 9.3 mg/dL (ref 8.9–10.3)
Chloride: 104 mmol/L (ref 98–111)
Creatinine, Ser: 1.39 mg/dL — ABNORMAL HIGH (ref 0.61–1.24)
GFR, Estimated: 56 mL/min — ABNORMAL LOW (ref >60)
Glucose, Bld: 103 mg/dL — ABNORMAL HIGH (ref 70–99)
Potassium: 4.7 mmol/L (ref 3.5–5.1)
Sodium: 138 mmol/L (ref 135–145)
Total Bilirubin: 0.5 mg/dL (ref 0.3–1.2)
Total Protein: 7.8 g/dL (ref 6.5–8.1)

## 2021-01-19 LAB — APTT: aPTT: 30 seconds (ref 24–36)

## 2021-01-19 LAB — PROTIME-INR
INR: 1 (ref 0.8–1.2)
Prothrombin Time: 12.8 seconds (ref 11.4–15.2)

## 2021-01-19 MED ORDER — ABIRATERONE ACETATE 250 MG PO TABS
1000.0000 mg | ORAL_TABLET | Freq: Every day | ORAL | 0 refills | Status: DC
  Filled 2021-01-19: qty 120, 30d supply, fill #0

## 2021-01-19 MED ORDER — ABIRATERONE ACETATE 250 MG PO TABS: 1000.0000 mg | ORAL_TABLET | Freq: Every day | ORAL | 0 refills | Status: DC

## 2021-01-19 NOTE — Progress Notes (Signed)
Oral Oncology Pharmacist Encounter  Prescription refill for Zytiga sent to [facility] in error. Patient enrolled in manufacturer assistance and receives medication through [employer]. Prescription redirected to [facility].  Leron Croak, PharmD, BCPS Hematology/Oncology Clinical Pharmacist [facility] [phone] 01/19/2021 4:20 PM

## 2021-01-20 LAB — SARS CORONAVIRUS 2 (TAT 6-24 HRS): SARS Coronavirus 2: NEGATIVE

## 2021-01-22 ENCOUNTER — Telehealth: Payer: Self-pay | Admitting: *Deleted

## 2021-01-22 NOTE — H&P (Signed)
Patient is a 65 year old African American male seen today for evaluation of elevated PSA noted at recent exam at PCP office. The patient presented to PCP with 13 lb weight loss over the last 6 months. Patient had several lab test performed but PSA returned at 101 on 06/02/2020. There is family history of prostate cancer with his youngest brother being diagnosed in his mid 10s with prostate cancer underwent prostatectomy. He is alive and well. Unclear whether he has had prior PSAs before. Patient is having no significant urinary symptomatology.  -10/04/20-patient with history of elevated PSA underwent recent transrectal ultrasound prostate biopsy on 08/17/2020 which unfortunately shows 12/12 cores positive for adenocarcinoma the prostate. 6 out of the 12 cores showed Gleason 4+5=9. The remaining cores were positive for Gleason 4+3=7 disease. Because of the high-grade nature I would obtain CT scan and bone scan in the interim. CT scan dated 09/12/2020 showed mild external iliac lymphadenopathy which were stable since January of 2014 suggesting a benign process but no other obvious metastatic disease. Bone scan from 09/12/2020 also showed no evidence of metastatic disease. Here to discuss next steps of management.  She had lengthy discussion today regarding his prostate cancer being high risk disease. Because of this I recommended definitive management. We discussed briefly the treatment options between surgical treatment and radiation treatment for prostate cancer. I think based on his age I would probably suggest he proceed with surgical management with robotic prostatectomy. We discussed the risks and benefits the procedure in detail today. I have given him a prostate cancer information book today. I am going to make referral to 1 of my partners for consideration of robotic prostatectomy in the near future. Patient agreeable for consultation.  11/02/20-per DR Manny:he likey has micrometastatic disease and very likely  to require multimodal therapy regardless of primary treatment, which of course is still indicated at his age. He is more riskly than typical surgical candidate given his CV history, would need to re-establish with cards and likelyi have ischemic eval prior. Alternativey primary raditation (likely XRT + brachy boost + 2 years androgen deprivation) very reasonable and perhaps preferred given his comorbidity and non-obstructing prostate size.   Strongly rec rad-on opinion to discuss, and then rediscuss with [place]. he is leaning more towrads primary radiation at this point. He has appt later this AM.   Stable medical renal disease, no hydro to suggest obstructive etiology   He will notify us of his final treatment decision.  -11/16/20-the patient with recent diagnosis of adenocarcinoma the prostate with high-grade high-risk disease. Has beenMa seen by Dr Tresa Moore regarding possible robotic prostatectomy and also by Radiation Oncology. After looking at risk factors in discussing treatment options he ultimately has decided on combination of X IMRT plus interstitial brachytherapy and androgen deprivation therapy for 2 years. He is here for 1st Firmagon injection today.  -12/11/20-patient with history of adenocarcinoma the prostate with high risk disease. Patient scheduled for IMRT plus interstitial brachytherapy. Received his 1st Firmagon injection on 11/16/2020. Now here for follow-up Eligard. Unfortunately patient is a few days early for injection. Will need to schedule for nurse visit in a few days for Eligard. He has tolerated the ADT with minimal hot flashes so far after the Eligard.   01/15/2021: Here today for preoperative appointment prior to undergoing radioactive seed implant, Space OAR insertion on 04/26 with his urologist. Once this is complete he will begin IMRT as well. He continues ADT with Eligard with last administration occurring on 03/21. He started  Zytiga on 03/22 from Dr Alen Blew.   Patient doing  well. Tolerating ADT with only minimal side effects endorsing some mild fatigue and occasional hot flashes. These are well tolerated by the patient. Voiding symptoms remain stable with non bothersome daytime frequency, he voids with a good force of stream without burning or painful urination, visible blood in the urine. Nocturia stable 1-2 times nightly. He denies any changes to past medical history since time of last office visit. No interval infection treatment, surgical intervention or procedural treatment. Denies any recent fevers or chills, nausea/vomiting.     ALLERGIES: None   MEDICATIONS: Acetaminophen  Amlodipine Besilate  Atorvastatin Calcium  Carvedilol  Colcrys  Loperamide  Losartan Potassium  Methylprednisolone  Nitroglycerin  Selenium Sulfide  Spironolactone  Triamcinolone     GU PSH: Locm 300-399Mg /Ml Iodine,1Ml - 09/12/2020 Prostate Needle Biopsy - 08/17/2020       PSH Notes: left knee surgery 1970  left arm surgery 1979  skin grafts   NON-GU PSH: Surgical Pathology, Gross And Microscopic Examination For Prostate Needle - 08/17/2020     GU PMH: Prostate Cancer - 12/18/2020, - 12/11/2020, - 11/16/2020, - 11/07/2020, - 10/09/2020, - 10/04/2020 Family Hx of Prostate Cancer - 12/11/2020, - 11/16/2020, - 10/04/2020, - 07/19/2020 Chronic kidney disease stage 3 (GFR 30-60) - 10/09/2020 Elevated PSA - 09/12/2020, - 07/19/2020    NON-GU PMH: Gout Hypertension Other heart failure    FAMILY HISTORY: 2 sons - Son Blood In Urine - Brother father deceased - Father Kidney Stones - Brother mother deceased - Mother Prostate Cancer - Brother    Notes: 5 daughters   SOCIAL HISTORY: Marital Status: Single Current Smoking Status: Patient has never smoked.   Tobacco Use Assessment Completed: Used Tobacco in last 30 days? Drinks 4 drinks per day. Types of alcohol consumed: Beer.  Does not drink caffeine. 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    REVIEW OF  SYSTEMS:    GU Review Male:   Patient reports get up at night to urinate. Patient denies frequent urination, hard to postpone urination, burning/ pain with urination, leakage of urine, stream starts and stops, trouble starting your stream, have to strain to urinate , erection problems, and penile pain.  Gastrointestinal (Upper):   Patient denies nausea, vomiting, and indigestion/ heartburn.  Gastrointestinal (Lower):   Patient denies diarrhea and constipation.  Constitutional:   Patient reports night sweats and fatigue. Patient denies fever and weight loss.  Skin:   Patient denies skin rash/ lesion and itching.  Eyes:   Patient denies blurred vision and double vision.  Ears/ Nose/ Throat:   Patient denies sore throat and sinus problems.  Hematologic/Lymphatic:   Patient denies swollen glands and easy bruising.  Cardiovascular:   Patient denies leg swelling and chest pains.  Respiratory:   Patient denies cough and shortness of breath.  Endocrine:   Patient denies excessive thirst.  Musculoskeletal:   Patient denies back pain and joint pain.  Neurological:   Patient denies headaches and dizziness.  Psychologic:   Patient denies depression and anxiety.   VITAL SIGNS:      01/15/2021 10:01 AM  Weight 205 lb / 92.99 kg  Height 72 in / 182.88 cm  BP 133/74 mmHg  Pulse 67 /min  Temperature 98.0 F / 36.6 C  BMI 27.8 kg/m   MULTI-SYSTEM PHYSICAL EXAMINATION:    Constitutional: Well-nourished. No physical deformities. Normally developed. Good grooming. Extremely pleasant.   Neck: Neck symmetrical, not swollen. Normal tracheal position.  Respiratory:  No labored breathing, no use of accessory muscles.   Cardiovascular: Normal temperature, normal extremity pulses, no swelling, no varicosities.  Skin: No paleness, no jaundice, no cyanosis. No lesion, no ulcer, no rash.  Neurologic / Psychiatric: Oriented to time, oriented to place, oriented to person. No depression, no anxiety, no agitation.   Gastrointestinal: No mass, no tenderness, no rigidity, non obese abdomen.  Musculoskeletal: Normal gait and station of head and neck.     Complexity of Data:  Source Of History:  Patient, Medical Record Summary  Lab Test Review:   PSA  Records Review:   Pathology Reports, Previous Doctor Records, Previous Hospital Records, Previous Patient Records  Urine Test Review:   Urinalysis   07/19/20  PSA  Total PSA 71.30 ng/mL    PROCEDURES:          Urinalysis Dipstick Dipstick Cont'd  Color: Amber Bilirubin: Neg mg/dL  Appearance: Clear Ketones: Neg mg/dL  Specific Gravity: 1.025 Blood: Neg ery/uL  pH: 6.0 Protein: Neg mg/dL  Glucose: Neg mg/dL Urobilinogen: 0.2 mg/dL    Nitrites: Neg    Leukocyte Esterase: Neg leu/uL    ASSESSMENT:      ICD-10 Details  1 GU:   Prostate Cancer - C61 Chronic, Threat to Bodily Function   PLAN:           Orders Labs Urine Culture          Schedule Return Visit/Planned Activity: Keep Scheduled Appointment - Schedule Surgery, Follow up MD          Document Letter(s):  Created for Patient: Clinical Summary         Notes:   All questions answered to the best my ability about upcoming procedure and expected postoperative course. Urine culture sent today to serve as baseline testing for his upcoming radioactive seed implant. Moving forward he will keep scheduled surgery date on 04/26 with his urologist.  Addendum, Agree with assessment and plan

## 2021-01-22 NOTE — Telephone Encounter (Signed)
CALLED PATIENT TO REMIND OF PROCEDURE FOR 01-23-21, SPOKE WITH PATIENT AND HE IS AWARE OF THIS PROCEDURE

## 2021-01-23 ENCOUNTER — Encounter (HOSPITAL_BASED_OUTPATIENT_CLINIC_OR_DEPARTMENT_OTHER)
Admission: RE | Disposition: A | Discharge status: Home / Self Care | Payer: Self-pay | Source: Home / Self Care | Attending: Urology

## 2021-01-23 ENCOUNTER — Ambulatory Visit (HOSPITAL_BASED_OUTPATIENT_CLINIC_OR_DEPARTMENT_OTHER): Payer: Medicare Other | Admitting: Anesthesiology

## 2021-01-23 ENCOUNTER — Ambulatory Visit (HOSPITAL_COMMUNITY): Payer: Medicare Other

## 2021-01-23 ENCOUNTER — Other Ambulatory Visit (HOSPITAL_COMMUNITY): Payer: Self-pay

## 2021-01-23 ENCOUNTER — Encounter (HOSPITAL_BASED_OUTPATIENT_CLINIC_OR_DEPARTMENT_OTHER): Payer: Self-pay | Admitting: Urology

## 2021-01-23 ENCOUNTER — Ambulatory Visit (HOSPITAL_BASED_OUTPATIENT_CLINIC_OR_DEPARTMENT_OTHER)
Admission: RE | Admit: 2021-01-23 | Discharge: 2021-01-23 | Disposition: A | Discharge status: Home / Self Care | Payer: Medicare Other | Attending: Urology | Admitting: Urology

## 2021-01-23 DIAGNOSIS — Z79899 Other long term (current) drug therapy: Secondary | ICD-10-CM | POA: Insufficient documentation

## 2021-01-23 DIAGNOSIS — C61 Malignant neoplasm of prostate: Secondary | ICD-10-CM | POA: Diagnosis not present

## 2021-01-23 DIAGNOSIS — I5042 Chronic combined systolic (congestive) and diastolic (congestive) heart failure: Secondary | ICD-10-CM | POA: Diagnosis not present

## 2021-01-23 DIAGNOSIS — Z8042 Family history of malignant neoplasm of prostate: Secondary | ICD-10-CM | POA: Diagnosis not present

## 2021-01-23 DIAGNOSIS — Z7952 Long term (current) use of systemic steroids: Secondary | ICD-10-CM | POA: Insufficient documentation

## 2021-01-23 DIAGNOSIS — I130 Hypertensive heart and chronic kidney disease with heart failure and stage 1 through stage 4 chronic kidney disease, or unspecified chronic kidney disease: Secondary | ICD-10-CM | POA: Diagnosis not present

## 2021-01-23 DIAGNOSIS — N183 Chronic kidney disease, stage 3 unspecified: Secondary | ICD-10-CM | POA: Diagnosis not present

## 2021-01-23 HISTORY — PX: SPACE OAR INSTILLATION: SHX6769

## 2021-01-23 HISTORY — DX: Chronic kidney disease, unspecified: N18.9

## 2021-01-23 HISTORY — PX: RADIOACTIVE SEED IMPLANT: SHX5150

## 2021-01-23 HISTORY — DX: Gout, unspecified: M10.9

## 2021-01-23 HISTORY — PX: CYSTOSCOPY: SHX5120

## 2021-01-23 SURGERY — INSERTION, RADIATION SOURCE, PROSTATE
Anesthesia: General | Site: Rectum

## 2021-01-23 MED ORDER — KETOROLAC TROMETHAMINE 30 MG/ML IJ SOLN
INTRAMUSCULAR | Status: AC
  Filled 2021-01-23: qty 1

## 2021-01-23 MED ORDER — PROPOFOL 10 MG/ML IV BOLUS
INTRAVENOUS | Status: DC | PRN
  Administered 2021-01-23: 30 mg via INTRAVENOUS
  Administered 2021-01-23: 100 mg via INTRAVENOUS
  Administered 2021-01-23: 200 mg via INTRAVENOUS

## 2021-01-23 MED ORDER — PHENYLEPHRINE HCL (PRESSORS) 10 MG/ML IV SOLN
INTRAVENOUS | Status: AC
  Filled 2021-01-23: qty 1

## 2021-01-23 MED ORDER — SODIUM CHLORIDE 0.9 % IV SOLN: INTRAVENOUS | Status: DC

## 2021-01-23 MED ORDER — FENTANYL CITRATE (PF) 100 MCG/2ML IJ SOLN
INTRAMUSCULAR | Status: DC | PRN
  Administered 2021-01-23: 25 ug via INTRAVENOUS
  Administered 2021-01-23: 50 ug via INTRAVENOUS
  Administered 2021-01-23: 25 ug via INTRAVENOUS

## 2021-01-23 MED ORDER — CEFAZOLIN SODIUM-DEXTROSE 2-4 GM/100ML-% IV SOLN
INTRAVENOUS | Status: AC
  Filled 2021-01-23: qty 100

## 2021-01-23 MED ORDER — ONDANSETRON HCL 4 MG/2ML IJ SOLN: 4.0000 mg | Freq: Once | INTRAMUSCULAR | Status: DC | PRN

## 2021-01-23 MED ORDER — LIDOCAINE 2% (20 MG/ML) 5 ML SYRINGE
INTRAMUSCULAR | Status: DC | PRN
  Administered 2021-01-23: 60 mg via INTRAVENOUS

## 2021-01-23 MED ORDER — MIDAZOLAM HCL 2 MG/2ML IJ SOLN
INTRAMUSCULAR | Status: AC
  Filled 2021-01-23: qty 2

## 2021-01-23 MED ORDER — DEXAMETHASONE SODIUM PHOSPHATE 10 MG/ML IJ SOLN
INTRAMUSCULAR | Status: DC | PRN
  Administered 2021-01-23: 4 mg via INTRAVENOUS

## 2021-01-23 MED ORDER — DEXAMETHASONE SODIUM PHOSPHATE 10 MG/ML IJ SOLN
INTRAMUSCULAR | Status: AC
  Filled 2021-01-23: qty 1

## 2021-01-23 MED ORDER — ONDANSETRON HCL 4 MG/2ML IJ SOLN
INTRAMUSCULAR | Status: DC | PRN
  Administered 2021-01-23: 4 mg via INTRAVENOUS

## 2021-01-23 MED ORDER — PROPOFOL 10 MG/ML IV BOLUS
INTRAVENOUS | Status: AC
  Filled 2021-01-23: qty 20

## 2021-01-23 MED ORDER — FENTANYL CITRATE (PF) 100 MCG/2ML IJ SOLN
INTRAMUSCULAR | Status: AC
  Filled 2021-01-23: qty 2

## 2021-01-23 MED ORDER — FLEET ENEMA 7-19 GM/118ML RE ENEM: 1.0000 | ENEMA | Freq: Once | RECTAL | Status: DC

## 2021-01-23 MED ORDER — SODIUM CHLORIDE 0.9 % IR SOLN
Status: DC | PRN
  Administered 2021-01-23: 1000 mL

## 2021-01-23 MED ORDER — OXYCODONE HCL 5 MG/5ML PO SOLN: 5.0000 mg | Freq: Once | ORAL | Status: DC | PRN

## 2021-01-23 MED ORDER — HYDROMORPHONE HCL 1 MG/ML IJ SOLN: 0.2500 mg | INTRAMUSCULAR | Status: DC | PRN

## 2021-01-23 MED ORDER — LIDOCAINE 2% (20 MG/ML) 5 ML SYRINGE
INTRAMUSCULAR | Status: AC
  Filled 2021-01-23: qty 5

## 2021-01-23 MED ORDER — ACETAMINOPHEN 500 MG PO TABS
ORAL_TABLET | ORAL | Status: AC
  Filled 2021-01-23: qty 2

## 2021-01-23 MED ORDER — TRAMADOL HCL 50 MG PO TABS
50.0000 mg | ORAL_TABLET | Freq: Four times a day (QID) | ORAL | 0 refills | Status: DC | PRN
  Filled 2021-01-23: qty 20, 5d supply, fill #0

## 2021-01-23 MED ORDER — ONDANSETRON HCL 4 MG/2ML IJ SOLN
INTRAMUSCULAR | Status: AC
  Filled 2021-01-23: qty 2

## 2021-01-23 MED ORDER — ACETAMINOPHEN 500 MG PO TABS
1000.0000 mg | ORAL_TABLET | Freq: Once | ORAL | Status: AC
  Administered 2021-01-23: 1000 mg via ORAL

## 2021-01-23 MED ORDER — SODIUM CHLORIDE (PF) 0.9 % IJ SOLN
INTRAMUSCULAR | Status: DC | PRN
  Administered 2021-01-23: 10 mL

## 2021-01-23 MED ORDER — SODIUM CHLORIDE 0.9 % IV SOLN
INTRAVENOUS | Status: DC | PRN
  Administered 2021-01-23: 20 ug/min via INTRAVENOUS

## 2021-01-23 MED ORDER — EPHEDRINE SULFATE 50 MG/ML IJ SOLN
INTRAMUSCULAR | Status: DC | PRN
  Administered 2021-01-23: 5 mg via INTRAVENOUS

## 2021-01-23 MED ORDER — OXYCODONE HCL 5 MG PO TABS
5.0000 mg | ORAL_TABLET | Freq: Once | ORAL | Status: DC | PRN
Start: 2021-01-23 — End: 2021-01-23

## 2021-01-23 MED ORDER — CEFAZOLIN SODIUM-DEXTROSE 2-4 GM/100ML-% IV SOLN
2.0000 g | Freq: Once | INTRAVENOUS | Status: AC
  Administered 2021-01-23: 2 g via INTRAVENOUS

## 2021-01-23 MED ORDER — IOHEXOL 300 MG/ML  SOLN
INTRAMUSCULAR | Status: DC | PRN
  Administered 2021-01-23: 7 mL

## 2021-01-23 SURGICAL SUPPLY — 41 items
BAG DRN RND TRDRP ANRFLXCHMBR (UROLOGICAL SUPPLIES) ×3
BAG URINE DRAIN 2000ML AR STRL (UROLOGICAL SUPPLIES) ×4 IMPLANT
BLADE CLIPPER SENSICLIP SURGIC (BLADE) ×4 IMPLANT
CATH FOLEY 2WAY SLVR  5CC 16FR (CATHETERS) ×8
CATH FOLEY 2WAY SLVR 5CC 16FR (CATHETERS) ×6 IMPLANT
CATH ROBINSON RED A/P 16FR (CATHETERS) IMPLANT
CATH ROBINSON RED A/P 20FR (CATHETERS) ×4 IMPLANT
CLOTH BEACON ORANGE TIMEOUT ST (SAFETY) ×4 IMPLANT
CNTNR URN SCR LID CUP LEK RST (MISCELLANEOUS) ×6 IMPLANT
CONT SPEC 4OZ STRL OR WHT (MISCELLANEOUS) ×8
COVER BACK TABLE 60X90IN (DRAPES) ×4 IMPLANT
COVER MAYO STAND STRL (DRAPES) ×4 IMPLANT
DRAPE C-ARM 35X43 STRL (DRAPES) ×4 IMPLANT
DRSG TEGADERM 4X4.75 (GAUZE/BANDAGES/DRESSINGS) ×7 IMPLANT
DRSG TEGADERM 8X12 (GAUZE/BANDAGES/DRESSINGS) ×8 IMPLANT
GAUZE SPONGE 4X4 12PLY STRL (GAUZE/BANDAGES/DRESSINGS) ×1 IMPLANT
GLOVE SURG ENC MOIS LTX SZ6.5 (GLOVE) ×4 IMPLANT
GLOVE SURG ENC MOIS LTX SZ7.5 (GLOVE) ×4 IMPLANT
GLOVE SURG ORTHO LTX SZ8.5 (GLOVE) ×4 IMPLANT
GLOVE SURG POLYISO LF SZ6.5 (GLOVE) IMPLANT
GLOVE SURG UNDER POLY LF SZ6.5 (GLOVE) ×1 IMPLANT
GLOVE SURG UNDER POLY LF SZ7.5 (GLOVE) ×1 IMPLANT
GOWN STRL REUS W/ TWL LRG LVL3 (GOWN DISPOSABLE) IMPLANT
GOWN STRL REUS W/TWL LRG LVL3 (GOWN DISPOSABLE) ×5 IMPLANT
GOWN STRL REUS W/TWL XL LVL3 (GOWN DISPOSABLE) ×4 IMPLANT
HOLDER FOLEY CATH W/STRAP (MISCELLANEOUS) ×4 IMPLANT
I-SEED AGX100 ×59 IMPLANT
IMPL SPACEOAR VUE SYSTEM (Spacer) IMPLANT
IMPLANT SPACEOAR VUE SYSTEM (Spacer) ×4 IMPLANT
IV NS 1000ML (IV SOLUTION) ×4
IV NS 1000ML BAXH (IV SOLUTION) ×3 IMPLANT
KIT TURNOVER CYSTO (KITS) ×4 IMPLANT
MARKER SKIN DUAL TIP RULER LAB (MISCELLANEOUS) ×4 IMPLANT
PACK CYSTO (CUSTOM PROCEDURE TRAY) ×4 IMPLANT
SURGILUBE 2OZ TUBE FLIPTOP (MISCELLANEOUS) ×4 IMPLANT
SUT BONE WAX W31G (SUTURE) IMPLANT
SYR 10ML LL (SYRINGE) ×8 IMPLANT
SYR 20ML LL LF (SYRINGE) IMPLANT
TOWEL OR 17X26 10 PK STRL BLUE (TOWEL DISPOSABLE) ×4 IMPLANT
UNDERPAD 30X36 HEAVY ABSORB (UNDERPADS AND DIAPERS) ×8 IMPLANT
WATER STERILE IRR 500ML POUR (IV SOLUTION) ×4 IMPLANT

## 2021-01-23 NOTE — Interval H&P Note (Signed)
History and Physical Interval Note:  01/23/2021 8:31 AM  [name]  has presented today for surgery, with the diagnosis of PROSTATE CANCER.  The various methods of treatment have been discussed with the patient and family. After consideration of risks, benefits and other options for treatment, the patient has consented to  Procedure(s) with comments: RADIOACTIVE SEED IMPLANT/BRACHYTHERAPY IMPLANT (N/A) - 90 MINS SPACE OAR INSTILLATION (N/A) as a surgical intervention.  The patient's history has been reviewed, patient examined, no change in status, stable for surgery.  I have reviewed the patient's chart and labs.  Questions were answered to the patient's satisfaction.     [name]

## 2021-01-23 NOTE — Op Note (Signed)
Preoperative diagnosis: Clinically localized adenocarcinoma of the prostate   Postoperative diagnosis: Clinically localized adenocarcinoma of the prostate  Procedure: 1) Transperineal placement of radioactive seeds into the prostate                    2) Cystoscopy                    3) Insertion of SpaceOAR hydrogel   Surgeon: Dr Harold Barban  Radiation oncologist:Manning  Anesthesia: General  EBL: Minimal  Complications: None  Indication: [name] is a 65 y.o. gentleman with clinically localized prostate cancer. After discussing management options for treatment, he elected to proceed with radiotherapy. He presents today for the above procedures. The potential risks, complications, alternative options, and expected recovery course have been discussed in detail with the patient and he has provided informed consent to proceed.  Description of procedure: The patient was taken to the operating room and general anesthesia was induced. He was administered preoperative antibiotics, placed in the dorsal lithotomy position, and prepped and draped in the usual sterile fashion. Next, intraoperative transrectal ultrasonography was utilized for real-time intraoperative planning by the radiation oncology team. Once the treatment plan was completed and the seed strands created, stranded iodine 125 radiation seeds were placed utilizing a brachytherapy perineal template. 67 radioactive iodine 125 seeds into the prostate through 19 catheter needles.  The brachytherapy template was then removed.  A site in the midline was selected on the perineum for placement of an 18 g needle with saline.  The needle was advanced above the rectum and below Denonvillier's fascia to the mid gland and confirmed to be in the midline on transverse imaging.  One cc of saline was injected confirming appropriate expansion of this space.  A total of 5 cc of saline was then injected to open the space further bilaterally.  The  saline syringe was then removed and the SpaceOAR hydrogel was injected with good distribution bilaterally. Position of the radiation seeds was confirmed on fluoroscopic imaging.  Flexible cystoscopy was then performed and no seeds were identified within the bladder.  No bladder tumors, stones, or other mucosal pathology was identified within the bladder. He tolerated the procedure well and without complications. He was able to be transferred to the recovery unit in satisfactory condition.  He was given a voiding trial in the PACU.

## 2021-01-23 NOTE — Anesthesia Preprocedure Evaluation (Addendum)
Anesthesia Evaluation  Patient identified by MRN, date of birth, ID band Patient awake    Reviewed: Allergy & Precautions, NPO status , Patient's Chart, lab work & pertinent test results, reviewed documented beta blocker date and time   Airway Mallampati: II  TM Distance: >3 FB Neck ROM: Full    Dental  (+) Missing, Dental Advisory Given,    Pulmonary former smoker,  Quit smoking 2013   Pulmonary exam normal breath sounds clear to auscultation       Cardiovascular hypertension, Pt. on medications and Pt. on home beta blockers +CHF (NICM 2013, normal LVEF 2202, grade 1 diastolic dysfunction)  Normal cardiovascular exam Rhythm:Regular Rate:Normal  Last echo 2019: - Left ventricle: The cavity size was normal. Wall thickness was  increased in a pattern of moderate LVH. The estimated ejection  fraction was 55%. Although no diagnostic regional wall motion  abnormality was identified, this possibility cannot be completely  excluded on the basis of this study. Doppler parameters are  consistent with abnormal left ventricular relaxation (grade 1  diastolic dysfunction).  - Aortic valve: There was no stenosis.  - Mitral valve: There was no significant regurgitation.  - Left atrium: The atrium was mildly dilated.  - Right ventricle: The cavity size was normal. Systolic function  was mildly reduced.  - Pulmonary arteries: No complete TR doppler jet so unable to  estimate PA systolic pressure.  - Systemic veins: IVC not visualized.    Neuro/Psych negative neurological ROS  negative psych ROS   GI/Hepatic negative GI ROS, (+)     substance abuse (hx etOH abuse- states only 1-2 beers/d, no other recent drug use)  alcohol use, cocaine use and marijuana use,   Endo/Other  negative endocrine ROS  Renal/GU CRFRenal diseaseCKD 3, Cr 1.4   Prostate ca    Musculoskeletal negative musculoskeletal ROS (+)   Abdominal    Peds  Hematology negative hematology ROS (+)   Anesthesia Other Findings   Reproductive/Obstetrics negative OB ROS                            Anesthesia Physical Anesthesia Plan  ASA: III  Anesthesia Plan: General   Post-op Pain Management:    Induction: Intravenous  PONV Risk Score and Plan: 3 and Ondansetron, Dexamethasone, Midazolam and Treatment may vary due to age or medical condition  Airway Management Planned: LMA  Additional Equipment: None  Intra-op Plan:   Post-operative Plan: Extubation in OR  Informed Consent: I have reviewed the patients History and Physical, chart, labs and discussed the procedure including the risks, benefits and alternatives for the proposed anesthesia with the patient or authorized representative who has indicated his/her understanding and acceptance.     Dental advisory given  Plan Discussed with: CRNA  Anesthesia Plan Comments:        Anesthesia Quick Evaluation

## 2021-01-23 NOTE — Anesthesia Procedure Notes (Signed)
Procedure Name: LMA Insertion Date/Time: 01/23/2021 10:38 AM Performed by: Bonney Aid, CRNA Pre-anesthesia Checklist: Patient identified, Emergency Drugs available, Suction available and Patient being monitored Patient Re-evaluated:Patient Re-evaluated prior to induction Oxygen Delivery Method: Circle system utilized Preoxygenation: Pre-oxygenation with 100% oxygen Induction Type: IV induction Ventilation: Mask ventilation without difficulty LMA: LMA inserted LMA Size: 5.0 Number of attempts: 1 Airway Equipment and Method: Bite block Placement Confirmation: positive ETCO2 Tube secured with: Tape Dental Injury: Teeth and Oropharynx as per pre-operative assessment

## 2021-01-23 NOTE — Anesthesia Postprocedure Evaluation (Signed)
Anesthesia Post Note  Patient: [name]  Procedure(s) Performed: RADIOACTIVE SEED IMPLANT/BRACHYTHERAPY IMPLANT (N/A Prostate) SPACE OAR INSTILLATION (N/A Rectum) CYSTOSCOPY FLEXIBLE (Bladder)     Patient location during evaluation: PACU Anesthesia Type: General Level of consciousness: awake and alert, oriented and patient cooperative Pain management: pain level controlled Vital Signs Assessment: post-procedure vital signs reviewed and stable Respiratory status: spontaneous breathing, nonlabored ventilation and respiratory function stable Cardiovascular status: blood pressure returned to baseline and stable Postop Assessment: no apparent nausea or vomiting Anesthetic complications: no   No complications documented.  Last Vitals:  Vitals:   01/23/21 1215 01/23/21 1230  BP: 128/70 130/70  Pulse: 66 68  Resp: 14 11  Temp:  36.5 C  SpO2: 100% 97%    Last Pain:  Vitals:   01/23/21 1230  TempSrc:   PainSc: 0-No pain                 Pervis Hocking

## 2021-01-23 NOTE — Discharge Instructions (Signed)
°  Post Anesthesia Home Care Instructions ° °Activity: °Get plenty of rest for the remainder of the day. A responsible individual must stay with you for 24 hours following the procedure.  °For the next 24 hours, DO NOT: °-Drive a car °-Operate machinery °-Drink alcoholic beverages °-Take any medication unless instructed by your physician °-Make any legal decisions or sign important papers. ° °Meals: °Start with liquid foods such as gelatin or soup. Progress to regular foods as tolerated. Avoid greasy, spicy, heavy foods. If nausea and/or vomiting occur, drink only clear liquids until the nausea and/or vomiting subsides. Call your physician if vomiting continues. ° °Special Instructions/Symptoms: °Your throat may feel dry or sore from the anesthesia or the breathing tube placed in your throat during surgery. If this causes discomfort, gargle with warm salt water. The discomfort should disappear within 24 hours. ° °If you had a scopolamine patch placed behind your ear for the management of post- operative nausea and/or vomiting: ° °1. The medication in the patch is effective for 72 hours, after which it should be removed.  Wrap patch in a tissue and discard in the trash. Wash hands thoroughly with soap and water. °2. You may remove the patch earlier than 72 hours if you experience unpleasant side effects which may include dry mouth, dizziness or visual disturbances. °3. Avoid touching the patch. Wash your hands with soap and water after contact with the patch. °  ° ° ° ° °Radioactive Seed Implant Home Care Instructions ° ° °Activity:    Rest for the remainder of the day.  Do not drive or operate equipment today.  You may resume normal  activities in a few days as instructed by your physician, without risk of harmful radiation exposure to those around you, provided you follow the time and distance precautions on the Radiation Oncology Instruction Sheet. ° ° °Meals: Drink plenty of lipuids and eat light foods, such as  gelatin or soup this evening .  You may return to normal meal plan tomorrow. ° °Return °To Work: You may return to work as instructed by your physician. ° °Special °Instruction:   If any seeds are found, use tweezers to pick up seeds and place in a glass container of any kind and bring to your physician's office. ° °Call your physician if any of these symptoms occur: ° °· Persistent or heavy bleeding °· Urine stream diminishes or stops completely after catheter is removed °· Fever equal to or greater than 101 degrees F °· Cloudy urine with a strong foul odor °· Severe pain ° °You may feel some burning pain and/or hesitancy when you urinate after the catheter is removed.  These symptoms may increase over the next few weeks, but should diminish within forur to six weeks.  Applying moist heat to the lower abdomen or a hot tub bath may help relieve the pain.  If the discomfort becomes severe, please call your physician for additional medications. ° °

## 2021-01-23 NOTE — Transfer of Care (Signed)
Immediate Anesthesia Transfer of Care Note  Patient: [name]  Procedure(s) Performed: RADIOACTIVE SEED IMPLANT/BRACHYTHERAPY IMPLANT (N/A Prostate) SPACE OAR INSTILLATION (N/A Rectum) CYSTOSCOPY FLEXIBLE (Bladder)  Patient Location: PACU  Anesthesia Type:General  Level of Consciousness: drowsy  Airway & Oxygen Therapy: Patient Spontanous Breathing and Patient connected to nasal cannula oxygen  Post-op Assessment: Report given to RN  Post vital signs: Reviewed and stable  Last Vitals:  Vitals Value Taken Time  BP 133/67 01/23/21 1156  Temp    Pulse 61 01/23/21 1157  Resp 12 01/23/21 1157  SpO2 100 % 01/23/21 1157  Vitals shown include unvalidated device data.  Last Pain:  Vitals:   01/23/21 0803  TempSrc: Oral  PainSc: 0-No pain      Patients Stated Pain Goal: 6 (24/26/83 4196)  Complications: No complications documented.

## 2021-01-23 NOTE — Progress Notes (Signed)
  Radiation Oncology         [phone] ________________________________  Name: Niccolas Loeper MRN: [id number]  Date: 01/23/2021  DOB: 01-02-1956       Prostate Seed Implant  CC:Anderson, [name], DO  No ref. provider found  DIAGNOSIS:  65 y.o. gentleman with Stage T1c adenocarcinoma of the prostate with Gleason score of 4+5, and PSA of 71.3.  Oncology History  Malignant neoplasm of prostate ([place])  11/07/2020 Initial Diagnosis   Malignant neoplasm of prostate ([place])   11/07/2020 Cancer Staging   Staging form: Prostate, AJCC 8th Edition - Clinical stage from 11/07/2020: Stage IIIC (cT1c, cN0, cM0, PSA: 71, Grade Group: 5) - Signed by Tyler Pita, MD on 11/07/2020 Histopathologic type: Adenocarcinoma, NOS Stage prefix: Initial diagnosis Prostate specific antigen (PSA) range: 20 or greater Gleason primary pattern: 4 Gleason secondary pattern: 5 Gleason score: 9 Histologic grading system: 5 grade system Number of biopsy cores examined: 12 Number of biopsy cores positive: 12 Location of positive needle core biopsies: Both sides     No diagnosis found.  PROCEDURE: Insertion of radioactive I-125 seeds into the prostate gland.  RADIATION DOSE: 110 Gy, boost therapy.  TECHNIQUE: Caedon Bond was brought to the operating room with the urologist. He was placed in the dorsolithotomy position. He was catheterized and a rectal tube was inserted. The perineum was shaved, prepped and draped. The ultrasound probe was then introduced into the rectum to see the prostate gland.  TREATMENT DEVICE: A needle grid was attached to the ultrasound probe stand and anchor needles were placed.  3D PLANNING: The prostate was imaged in 3D using a sagittal sweep of the prostate probe. These images were transferred to the planning computer. There, the prostate, urethra and rectum were defined on each axial reconstructed image. Then, the software created an optimized 3D plan and a few seed positions were  adjusted. The quality of the plan was reviewed using [facility] information for the target and the following two organs at risk:  Urethra and Rectum.  Then the accepted plan was printed and handed off to the radiation therapist.  Under my supervision, the custom loading of the seeds and spacers was carried out and loaded into sealed vicryl sleeves.  These pre-loaded needles were then placed into the needle holder.Marland Kitchen  PROSTATE VOLUME STUDY:  Using transrectal ultrasound the volume of the prostate was verified to be 25 cc.  SPECIAL TREATMENT PROCEDURE/SUPERVISION AND HANDLING: The pre-loaded needles were then delivered under sagittal guidance. A total of 19 needles were used to deposit 59 seeds in the prostate gland. The individual seed activity was 0.307 mCi.  SpaceOAR:  Yes  COMPLEX SIMULATION: At the end of the procedure, an anterior radiograph of the pelvis was obtained to document seed positioning and count. Cystoscopy was performed to check the urethra and bladder.  MICRODOSIMETRY: At the end of the procedure, the patient was emitting 0.049 mR/hr at 1 meter. Accordingly, he was considered safe for hospital discharge.  PLAN: The patient will return to the radiation oncology clinic for post implant CT dosimetry in three weeks.   ________________________________  Sheral Apley Tammi Klippel, M.D.

## 2021-01-24 ENCOUNTER — Other Ambulatory Visit (HOSPITAL_COMMUNITY): Payer: Self-pay

## 2021-01-24 DIAGNOSIS — C61 Malignant neoplasm of prostate: Secondary | ICD-10-CM | POA: Diagnosis not present

## 2021-01-24 MED ORDER — TAMSULOSIN HCL 0.4 MG PO CAPS
0.4000 mg | ORAL_CAPSULE | Freq: Every day | ORAL | 11 refills | Status: DC
  Filled 2021-01-24: qty 30, 30d supply, fill #0
  Filled 2021-03-01: qty 30, 30d supply, fill #1
  Filled 2021-04-02: qty 30, 30d supply, fill #2
  Filled 2021-05-10: qty 30, 30d supply, fill #3
  Filled 2021-06-11: qty 30, 30d supply, fill #4
  Filled 2021-07-31: qty 30, 30d supply, fill #5
  Filled 2021-09-03: qty 30, 30d supply, fill #6
  Filled 2021-10-08: qty 30, 30d supply, fill #7
  Filled 2021-11-15: qty 30, 30d supply, fill #8
  Filled 2021-12-17: qty 30, 30d supply, fill #9

## 2021-01-25 ENCOUNTER — Encounter (HOSPITAL_BASED_OUTPATIENT_CLINIC_OR_DEPARTMENT_OTHER): Payer: Self-pay | Admitting: Urology

## 2021-01-29 ENCOUNTER — Other Ambulatory Visit (HOSPITAL_COMMUNITY): Payer: Self-pay

## 2021-01-29 ENCOUNTER — Other Ambulatory Visit: Payer: Self-pay | Admitting: Student in an Organized Health Care Education/Training Program

## 2021-01-29 MED ORDER — SPIRONOLACTONE 25 MG PO TABS
12.5000 mg | ORAL_TABLET | Freq: Every day | ORAL | 0 refills | Status: DC
  Filled 2021-01-29: qty 45, 90d supply, fill #0
  Filled 2021-04-30: qty 45, 90d supply, fill #1

## 2021-01-29 MED FILL — Losartan Potassium Tab 25 MG: ORAL | 30 days supply | Qty: 30 | Fill #0 | Status: AC

## 2021-02-05 ENCOUNTER — Telehealth: Payer: Self-pay | Admitting: *Deleted

## 2021-02-05 NOTE — Telephone Encounter (Signed)
CALLED PATIENT TO REMIND OF SIM APPT. FOR 02-06-21- ARRIVAL TIME- 8:15 AM @ [facility],  LVM FOR A RETURN CALL

## 2021-02-06 ENCOUNTER — Encounter: Payer: Self-pay | Admitting: Medical Oncology

## 2021-02-06 ENCOUNTER — Other Ambulatory Visit: Payer: Self-pay

## 2021-02-06 ENCOUNTER — Ambulatory Visit
Admission: RE | Admit: 2021-02-06 | Discharge: 2021-02-06 | Disposition: A | Discharge status: Home / Self Care | Payer: Medicare Other | Source: Ambulatory Visit | Attending: Urology | Admitting: Urology

## 2021-02-06 DIAGNOSIS — C61 Malignant neoplasm of prostate: Secondary | ICD-10-CM | POA: Insufficient documentation

## 2021-02-06 NOTE — Progress Notes (Signed)
  Radiation Oncology         [phone]) [phone] ________________________________  Name: [name] MRN: [id number]  Date: 02/06/2021  DOB: 1955/12/18  SIMULATION AND TREATMENT PLANNING NOTE    ICD-10-CM   1. Malignant neoplasm of prostate ([place])  C61     DIAGNOSIS:  65 y.o. gentleman with Stage T1c adenocarcinoma of the prostate with Gleason score of 4+5, and PSA of 71.3.  NARRATIVE:  The patient was brought to the [facility].  Identity was confirmed.  All relevant records and images related to the planned course of therapy were reviewed.  The patient freely provided informed written consent to proceed with treatment after reviewing the details related to the planned course of therapy. The consent form was witnessed and verified by the simulation staff.  Then, the patient was set-up in a stable reproducible supine position for radiation therapy.  A vacuum lock pillow device was custom fabricated to position his legs in a reproducible immobilized position.  Then, I performed a urethrogram under sterile conditions to identify the prostatic apex.  CT images were obtained.  Surface markings were placed.  The CT images were loaded into the planning software.  Then the prostate target and avoidance structures including the rectum, bladder, bowel and hips were contoured.  Treatment planning then occurred.  The radiation prescription was entered and confirmed.  A total of one complex treatment devices were fabricated. I have requested : Intensity Modulated Radiotherapy (IMRT) is medically necessary for this case for the following reason:  Rectal sparing.  PLAN:  The patient will receive 45 Gy in 25 fractions of 1.8 Gy, to supplement an up-front prostate seed implant boost of 110 Gy to achieve a total nominal dose of 155 Gy.  ________________________________  Sheral Apley Tammi Klippel, M.D.  This document serves as a record of services personally performed by Tyler Pita, MD. It was created on  his behalf by Wilburn Mylar, a trained medical scribe. The creation of this record is based on the scribe's personal observations and the provider's statements to them. This document has been checked and approved by the attending provider.

## 2021-02-13 ENCOUNTER — Other Ambulatory Visit: Payer: Self-pay | Admitting: *Deleted

## 2021-02-13 DIAGNOSIS — C61 Malignant neoplasm of prostate: Secondary | ICD-10-CM

## 2021-02-13 MED ORDER — ABIRATERONE ACETATE 250 MG PO TABS: 1000.0000 mg | ORAL_TABLET | Freq: Every day | ORAL | 0 refills | Status: DC

## 2021-02-13 NOTE — Telephone Encounter (Signed)
Received faxed refill request from [facility] for Zytiga. Will refill per Dr. Hazeline Junker OV note 01/12/21

## 2021-02-14 ENCOUNTER — Encounter: Payer: Self-pay | Admitting: Radiation Oncology

## 2021-02-14 DIAGNOSIS — C61 Malignant neoplasm of prostate: Secondary | ICD-10-CM | POA: Diagnosis not present

## 2021-02-15 ENCOUNTER — Encounter: Payer: Self-pay | Admitting: Medical Oncology

## 2021-02-15 ENCOUNTER — Ambulatory Visit
Admission: RE | Admit: 2021-02-15 | Discharge: 2021-02-15 | Disposition: A | Discharge status: Home / Self Care | Payer: Medicare Other | Source: Ambulatory Visit | Attending: Radiation Oncology | Admitting: Radiation Oncology

## 2021-02-15 ENCOUNTER — Other Ambulatory Visit: Payer: Self-pay

## 2021-02-15 DIAGNOSIS — C61 Malignant neoplasm of prostate: Secondary | ICD-10-CM

## 2021-02-16 ENCOUNTER — Ambulatory Visit
Admission: RE | Admit: 2021-02-16 | Discharge: 2021-02-16 | Disposition: A | Discharge status: Home / Self Care | Payer: Medicare Other | Source: Ambulatory Visit | Attending: Radiation Oncology | Admitting: Radiation Oncology

## 2021-02-16 DIAGNOSIS — C61 Malignant neoplasm of prostate: Secondary | ICD-10-CM | POA: Diagnosis not present

## 2021-02-19 ENCOUNTER — Ambulatory Visit
Admission: RE | Admit: 2021-02-19 | Discharge: 2021-02-19 | Disposition: A | Discharge status: Home / Self Care | Payer: Medicare Other | Source: Ambulatory Visit | Attending: Radiation Oncology | Admitting: Radiation Oncology

## 2021-02-19 ENCOUNTER — Other Ambulatory Visit: Payer: Self-pay

## 2021-02-19 DIAGNOSIS — C61 Malignant neoplasm of prostate: Secondary | ICD-10-CM | POA: Diagnosis not present

## 2021-02-19 NOTE — Progress Notes (Signed)
  Radiation Oncology         [phone]) [phone] ________________________________  Name: [name] MRN: [id number]  Date: 02/14/2021  DOB: 09/17/1956  3D Planning Note   Prostate Brachytherapy Post-Implant Dosimetry  Diagnosis: 65 y.o. gentleman with Stage T1c adenocarcinoma of the prostate with Gleason score of 4+5, and PSA of 71.3.  Narrative: On a previous date, [name] returned following prostate seed implantation for post implant planning. He underwent CT scan complex simulation to delineate the three-dimensional structures of the pelvis and demonstrate the radiation distribution.  Since that time, the seed localization, and complex isodose planning with dose volume histograms have now been completed.  Results:   Prostate Coverage - The dose of radiation delivered to the 90% or more of the prostate gland (D90) was 94.08% of the prescription dose. This exceeds our goal of greater than 90%. Rectal Sparing - The volume of rectal tissue receiving the prescription dose or higher was 0.33 cc. This falls under our thresholds tolerance of 1.0 cc.  Impression: The prostate seed implant appears to show adequate target coverage and appropriate rectal sparing.  Plan:  The patient will continue to follow with urology for ongoing PSA determinations. I would anticipate a high likelihood for local tumor control with minimal risk for rectal morbidity.  This implant was his boost, and he is now receiving IMRT to the pelvis.  ________________________________  Sheral Apley. Tammi Klippel, M.D.

## 2021-02-20 ENCOUNTER — Ambulatory Visit
Admission: RE | Admit: 2021-02-20 | Discharge: 2021-02-20 | Disposition: A | Discharge status: Home / Self Care | Payer: Medicare Other | Source: Ambulatory Visit | Attending: Radiation Oncology | Admitting: Radiation Oncology

## 2021-02-20 ENCOUNTER — Other Ambulatory Visit: Payer: Self-pay

## 2021-02-20 DIAGNOSIS — C61 Malignant neoplasm of prostate: Secondary | ICD-10-CM | POA: Diagnosis not present

## 2021-02-21 ENCOUNTER — Ambulatory Visit
Admission: RE | Admit: 2021-02-21 | Discharge: 2021-02-21 | Disposition: A | Discharge status: Home / Self Care | Payer: Medicare Other | Source: Ambulatory Visit | Attending: Radiation Oncology | Admitting: Radiation Oncology

## 2021-02-21 DIAGNOSIS — C61 Malignant neoplasm of prostate: Secondary | ICD-10-CM | POA: Diagnosis not present

## 2021-02-22 ENCOUNTER — Ambulatory Visit
Admission: RE | Admit: 2021-02-22 | Discharge: 2021-02-22 | Disposition: A | Discharge status: Home / Self Care | Payer: Medicare Other | Source: Ambulatory Visit | Attending: Radiation Oncology | Admitting: Radiation Oncology

## 2021-02-22 ENCOUNTER — Other Ambulatory Visit: Payer: Self-pay

## 2021-02-22 DIAGNOSIS — C61 Malignant neoplasm of prostate: Secondary | ICD-10-CM | POA: Diagnosis not present

## 2021-02-23 ENCOUNTER — Ambulatory Visit
Admission: RE | Admit: 2021-02-23 | Discharge: 2021-02-23 | Disposition: A | Discharge status: Home / Self Care | Payer: Medicare Other | Source: Ambulatory Visit | Attending: Radiation Oncology | Admitting: Radiation Oncology

## 2021-02-23 DIAGNOSIS — C61 Malignant neoplasm of prostate: Secondary | ICD-10-CM | POA: Diagnosis not present

## 2021-02-27 ENCOUNTER — Other Ambulatory Visit: Payer: Self-pay

## 2021-02-27 ENCOUNTER — Ambulatory Visit
Admission: RE | Admit: 2021-02-27 | Discharge: 2021-02-27 | Disposition: A | Discharge status: Home / Self Care | Payer: Medicare Other | Source: Ambulatory Visit | Attending: Radiation Oncology | Admitting: Radiation Oncology

## 2021-02-27 DIAGNOSIS — C61 Malignant neoplasm of prostate: Secondary | ICD-10-CM | POA: Diagnosis not present

## 2021-02-28 ENCOUNTER — Ambulatory Visit
Admission: RE | Admit: 2021-02-28 | Discharge: 2021-02-28 | Disposition: A | Discharge status: Home / Self Care | Payer: Medicare Other | Source: Ambulatory Visit | Attending: Urology | Admitting: Urology

## 2021-02-28 DIAGNOSIS — Z510 Encounter for antineoplastic radiation therapy: Secondary | ICD-10-CM | POA: Insufficient documentation

## 2021-02-28 DIAGNOSIS — C61 Malignant neoplasm of prostate: Secondary | ICD-10-CM | POA: Diagnosis not present

## 2021-03-01 ENCOUNTER — Other Ambulatory Visit: Payer: Self-pay

## 2021-03-01 ENCOUNTER — Other Ambulatory Visit (HOSPITAL_COMMUNITY): Payer: Self-pay

## 2021-03-01 ENCOUNTER — Other Ambulatory Visit: Payer: Self-pay | Admitting: Student in an Organized Health Care Education/Training Program

## 2021-03-01 ENCOUNTER — Ambulatory Visit
Admission: RE | Admit: 2021-03-01 | Discharge: 2021-03-01 | Disposition: A | Discharge status: Home / Self Care | Payer: Medicare Other | Source: Ambulatory Visit | Attending: Radiation Oncology | Admitting: Radiation Oncology

## 2021-03-01 DIAGNOSIS — E782 Mixed hyperlipidemia: Secondary | ICD-10-CM

## 2021-03-01 DIAGNOSIS — C61 Malignant neoplasm of prostate: Secondary | ICD-10-CM | POA: Diagnosis not present

## 2021-03-01 DIAGNOSIS — Z510 Encounter for antineoplastic radiation therapy: Secondary | ICD-10-CM | POA: Diagnosis not present

## 2021-03-01 MED ORDER — ATORVASTATIN CALCIUM 40 MG PO TABS: 40.0000 mg | ORAL_TABLET | Freq: Every day | ORAL | 3 refills | Status: DC

## 2021-03-01 MED FILL — Losartan Potassium Tab 25 MG: ORAL | 30 days supply | Qty: 30 | Fill #1 | Status: AC

## 2021-03-01 NOTE — Progress Notes (Signed)
Spoke with [name] today to check in on how he's doing. He is on his way to cancer treatment which he expects to be completed by June 24th. He states that he is doing well overall.  We discussed scheduling a follow up check up visit June 27th at 1:30pm. I am also refilling atorvastatin today at increased dose of 40mg  which patient is agreeable to.

## 2021-03-02 ENCOUNTER — Other Ambulatory Visit (HOSPITAL_COMMUNITY): Payer: Self-pay

## 2021-03-02 ENCOUNTER — Ambulatory Visit
Admission: RE | Admit: 2021-03-02 | Discharge: 2021-03-02 | Disposition: A | Discharge status: Home / Self Care | Payer: Medicare Other | Source: Ambulatory Visit | Attending: Radiation Oncology | Admitting: Radiation Oncology

## 2021-03-02 DIAGNOSIS — C61 Malignant neoplasm of prostate: Secondary | ICD-10-CM | POA: Diagnosis not present

## 2021-03-02 DIAGNOSIS — Z510 Encounter for antineoplastic radiation therapy: Secondary | ICD-10-CM | POA: Diagnosis not present

## 2021-03-02 MED FILL — Selenium Sulfide Lotion 2.5%: CUTANEOUS | 30 days supply | Qty: 118 | Fill #1 | Status: AC

## 2021-03-05 ENCOUNTER — Ambulatory Visit
Admission: RE | Admit: 2021-03-05 | Discharge: 2021-03-05 | Disposition: A | Discharge status: Home / Self Care | Payer: Medicare Other | Source: Ambulatory Visit | Attending: Radiation Oncology | Admitting: Radiation Oncology

## 2021-03-05 ENCOUNTER — Other Ambulatory Visit: Payer: Self-pay

## 2021-03-05 DIAGNOSIS — Z510 Encounter for antineoplastic radiation therapy: Secondary | ICD-10-CM | POA: Diagnosis not present

## 2021-03-05 DIAGNOSIS — C61 Malignant neoplasm of prostate: Secondary | ICD-10-CM | POA: Diagnosis not present

## 2021-03-06 ENCOUNTER — Other Ambulatory Visit: Payer: Self-pay

## 2021-03-06 ENCOUNTER — Ambulatory Visit
Admission: RE | Admit: 2021-03-06 | Discharge: 2021-03-06 | Disposition: A | Discharge status: Home / Self Care | Payer: Medicare Other | Source: Ambulatory Visit | Attending: Radiation Oncology | Admitting: Radiation Oncology

## 2021-03-06 DIAGNOSIS — Z510 Encounter for antineoplastic radiation therapy: Secondary | ICD-10-CM | POA: Diagnosis not present

## 2021-03-06 DIAGNOSIS — C61 Malignant neoplasm of prostate: Secondary | ICD-10-CM

## 2021-03-06 MED ORDER — ABIRATERONE ACETATE 250 MG PO TABS: 1000.0000 mg | ORAL_TABLET | Freq: Every day | ORAL | 0 refills | Status: DC

## 2021-03-07 ENCOUNTER — Other Ambulatory Visit: Payer: Self-pay

## 2021-03-07 ENCOUNTER — Other Ambulatory Visit (HOSPITAL_COMMUNITY): Payer: Self-pay

## 2021-03-07 ENCOUNTER — Telehealth: Payer: Self-pay

## 2021-03-07 ENCOUNTER — Ambulatory Visit
Admission: RE | Admit: 2021-03-07 | Discharge: 2021-03-07 | Disposition: A | Discharge status: Home / Self Care | Payer: Medicare Other | Source: Ambulatory Visit | Attending: Radiation Oncology | Admitting: Radiation Oncology

## 2021-03-07 DIAGNOSIS — Z510 Encounter for antineoplastic radiation therapy: Secondary | ICD-10-CM | POA: Diagnosis not present

## 2021-03-07 DIAGNOSIS — C61 Malignant neoplasm of prostate: Secondary | ICD-10-CM | POA: Diagnosis not present

## 2021-03-07 MED ORDER — ATORVASTATIN CALCIUM 40 MG PO TABS
40.0000 mg | ORAL_TABLET | Freq: Every day | ORAL | 3 refills | Status: DC
  Filled 2021-03-07: qty 90, 90d supply, fill #0
  Filled 2021-05-31: qty 90, 90d supply, fill #1
  Filled 2021-09-03: qty 90, 90d supply, fill #2
  Filled 2021-11-20: qty 90, 90d supply, fill #3

## 2021-03-07 NOTE — Telephone Encounter (Signed)
Patient calls nurse line stating [facility] never received atorvastatin. I informed patient medication was sent to [facility]. Patient reports he does not use this pharmacy for that medication. I called [facility] and they do not dispense atorvastatin. I have resent medication to . The patient has been updated.

## 2021-03-08 ENCOUNTER — Ambulatory Visit
Admission: RE | Admit: 2021-03-08 | Discharge: 2021-03-08 | Disposition: A | Discharge status: Home / Self Care | Payer: Medicare Other | Source: Ambulatory Visit | Attending: Radiation Oncology | Admitting: Radiation Oncology

## 2021-03-08 DIAGNOSIS — Z510 Encounter for antineoplastic radiation therapy: Secondary | ICD-10-CM | POA: Diagnosis not present

## 2021-03-08 DIAGNOSIS — C61 Malignant neoplasm of prostate: Secondary | ICD-10-CM | POA: Diagnosis not present

## 2021-03-09 ENCOUNTER — Ambulatory Visit
Admission: RE | Admit: 2021-03-09 | Discharge: 2021-03-09 | Disposition: A | Discharge status: Home / Self Care | Payer: Medicare Other | Source: Ambulatory Visit | Attending: Radiation Oncology | Admitting: Radiation Oncology

## 2021-03-09 ENCOUNTER — Other Ambulatory Visit: Payer: Self-pay

## 2021-03-09 ENCOUNTER — Other Ambulatory Visit (HOSPITAL_COMMUNITY): Payer: Self-pay

## 2021-03-09 ENCOUNTER — Other Ambulatory Visit: Payer: Self-pay | Admitting: Student in an Organized Health Care Education/Training Program

## 2021-03-09 DIAGNOSIS — I10 Essential (primary) hypertension: Secondary | ICD-10-CM

## 2021-03-09 DIAGNOSIS — Z510 Encounter for antineoplastic radiation therapy: Secondary | ICD-10-CM | POA: Diagnosis not present

## 2021-03-09 DIAGNOSIS — C61 Malignant neoplasm of prostate: Secondary | ICD-10-CM | POA: Diagnosis not present

## 2021-03-09 MED ORDER — AMLODIPINE BESYLATE 10 MG PO TABS
ORAL_TABLET | Freq: Every day | ORAL | 1 refills | Status: DC
  Filled 2021-03-09: qty 90, 90d supply, fill #0
  Filled 2021-06-08: qty 90, 90d supply, fill #1

## 2021-03-09 MED FILL — Prednisone Tab 5 MG: ORAL | 90 days supply | Qty: 90 | Fill #0 | Status: AC

## 2021-03-12 ENCOUNTER — Other Ambulatory Visit: Payer: Self-pay

## 2021-03-12 ENCOUNTER — Ambulatory Visit
Admission: RE | Admit: 2021-03-12 | Discharge: 2021-03-12 | Disposition: A | Discharge status: Home / Self Care | Payer: Medicare Other | Source: Ambulatory Visit | Attending: Radiation Oncology | Admitting: Radiation Oncology

## 2021-03-12 ENCOUNTER — Other Ambulatory Visit (HOSPITAL_COMMUNITY): Payer: Self-pay

## 2021-03-12 DIAGNOSIS — Z510 Encounter for antineoplastic radiation therapy: Secondary | ICD-10-CM | POA: Diagnosis not present

## 2021-03-12 DIAGNOSIS — C61 Malignant neoplasm of prostate: Secondary | ICD-10-CM | POA: Diagnosis not present

## 2021-03-13 ENCOUNTER — Ambulatory Visit
Admission: RE | Admit: 2021-03-13 | Discharge: 2021-03-13 | Disposition: A | Discharge status: Home / Self Care | Payer: Medicare Other | Source: Ambulatory Visit | Attending: Radiation Oncology | Admitting: Radiation Oncology

## 2021-03-13 DIAGNOSIS — Z510 Encounter for antineoplastic radiation therapy: Secondary | ICD-10-CM | POA: Diagnosis not present

## 2021-03-13 DIAGNOSIS — C61 Malignant neoplasm of prostate: Secondary | ICD-10-CM | POA: Diagnosis not present

## 2021-03-14 ENCOUNTER — Ambulatory Visit
Admission: RE | Admit: 2021-03-14 | Discharge: 2021-03-14 | Disposition: A | Discharge status: Home / Self Care | Payer: Medicare Other | Source: Ambulatory Visit | Attending: Radiation Oncology | Admitting: Radiation Oncology

## 2021-03-14 DIAGNOSIS — C61 Malignant neoplasm of prostate: Secondary | ICD-10-CM | POA: Diagnosis not present

## 2021-03-14 DIAGNOSIS — Z510 Encounter for antineoplastic radiation therapy: Secondary | ICD-10-CM | POA: Diagnosis not present

## 2021-03-15 ENCOUNTER — Other Ambulatory Visit: Payer: Self-pay

## 2021-03-15 ENCOUNTER — Ambulatory Visit
Admission: RE | Admit: 2021-03-15 | Discharge: 2021-03-15 | Disposition: A | Discharge status: Home / Self Care | Payer: Medicare Other | Source: Ambulatory Visit | Attending: Radiation Oncology | Admitting: Radiation Oncology

## 2021-03-15 DIAGNOSIS — C61 Malignant neoplasm of prostate: Secondary | ICD-10-CM | POA: Diagnosis not present

## 2021-03-15 DIAGNOSIS — Z510 Encounter for antineoplastic radiation therapy: Secondary | ICD-10-CM | POA: Diagnosis not present

## 2021-03-16 ENCOUNTER — Ambulatory Visit
Admission: RE | Admit: 2021-03-16 | Discharge: 2021-03-16 | Disposition: A | Discharge status: Home / Self Care | Payer: Medicare Other | Source: Ambulatory Visit | Attending: Radiation Oncology | Admitting: Radiation Oncology

## 2021-03-16 DIAGNOSIS — Z510 Encounter for antineoplastic radiation therapy: Secondary | ICD-10-CM | POA: Diagnosis not present

## 2021-03-16 DIAGNOSIS — C61 Malignant neoplasm of prostate: Secondary | ICD-10-CM | POA: Diagnosis not present

## 2021-03-19 ENCOUNTER — Ambulatory Visit
Admission: RE | Admit: 2021-03-19 | Discharge: 2021-03-19 | Disposition: A | Discharge status: Home / Self Care | Payer: Medicare Other | Source: Ambulatory Visit | Attending: Radiation Oncology | Admitting: Radiation Oncology

## 2021-03-19 ENCOUNTER — Other Ambulatory Visit: Payer: Self-pay

## 2021-03-19 DIAGNOSIS — Z510 Encounter for antineoplastic radiation therapy: Secondary | ICD-10-CM | POA: Diagnosis not present

## 2021-03-19 DIAGNOSIS — C61 Malignant neoplasm of prostate: Secondary | ICD-10-CM | POA: Diagnosis not present

## 2021-03-20 ENCOUNTER — Ambulatory Visit
Admission: RE | Admit: 2021-03-20 | Discharge: 2021-03-20 | Disposition: A | Discharge status: Home / Self Care | Payer: Medicare Other | Source: Ambulatory Visit | Attending: Radiation Oncology | Admitting: Radiation Oncology

## 2021-03-20 DIAGNOSIS — Z510 Encounter for antineoplastic radiation therapy: Secondary | ICD-10-CM | POA: Diagnosis not present

## 2021-03-20 DIAGNOSIS — C61 Malignant neoplasm of prostate: Secondary | ICD-10-CM | POA: Diagnosis not present

## 2021-03-21 ENCOUNTER — Other Ambulatory Visit: Payer: Self-pay

## 2021-03-21 ENCOUNTER — Ambulatory Visit
Admission: RE | Admit: 2021-03-21 | Discharge: 2021-03-21 | Disposition: A | Discharge status: Home / Self Care | Payer: Medicare Other | Source: Ambulatory Visit | Attending: Radiation Oncology | Admitting: Radiation Oncology

## 2021-03-21 DIAGNOSIS — C61 Malignant neoplasm of prostate: Secondary | ICD-10-CM | POA: Diagnosis not present

## 2021-03-21 DIAGNOSIS — Z510 Encounter for antineoplastic radiation therapy: Secondary | ICD-10-CM | POA: Diagnosis not present

## 2021-03-22 ENCOUNTER — Encounter: Payer: Self-pay | Admitting: Urology

## 2021-03-22 ENCOUNTER — Ambulatory Visit
Admission: RE | Admit: 2021-03-22 | Discharge: 2021-03-22 | Disposition: A | Discharge status: Home / Self Care | Payer: Medicare Other | Source: Ambulatory Visit | Attending: Radiation Oncology | Admitting: Radiation Oncology

## 2021-03-22 DIAGNOSIS — C61 Malignant neoplasm of prostate: Secondary | ICD-10-CM | POA: Diagnosis not present

## 2021-03-22 DIAGNOSIS — Z510 Encounter for antineoplastic radiation therapy: Secondary | ICD-10-CM | POA: Diagnosis not present

## 2021-03-23 DIAGNOSIS — C61 Malignant neoplasm of prostate: Secondary | ICD-10-CM | POA: Diagnosis not present

## 2021-03-23 DIAGNOSIS — Z510 Encounter for antineoplastic radiation therapy: Secondary | ICD-10-CM | POA: Diagnosis not present

## 2021-03-26 ENCOUNTER — Ambulatory Visit (INDEPENDENT_AMBULATORY_CARE_PROVIDER_SITE_OTHER): Payer: Medicare Other | Admitting: Student in an Organized Health Care Education/Training Program

## 2021-03-26 ENCOUNTER — Other Ambulatory Visit: Payer: Self-pay

## 2021-03-26 VITALS — BP 110/70 | HR 67 | Ht 72.0 in | Wt 213.8 lb

## 2021-03-26 DIAGNOSIS — K089 Disorder of teeth and supporting structures, unspecified: Secondary | ICD-10-CM | POA: Diagnosis not present

## 2021-03-26 DIAGNOSIS — Z1211 Encounter for screening for malignant neoplasm of colon: Secondary | ICD-10-CM | POA: Diagnosis not present

## 2021-03-26 DIAGNOSIS — C61 Malignant neoplasm of prostate: Secondary | ICD-10-CM

## 2021-03-26 DIAGNOSIS — I10 Essential (primary) hypertension: Secondary | ICD-10-CM | POA: Diagnosis not present

## 2021-03-26 DIAGNOSIS — Z0000 Encounter for general adult medical examination without abnormal findings: Secondary | ICD-10-CM

## 2021-03-26 NOTE — Patient Instructions (Signed)
It was a pleasure to see you today!  To summarize our discussion for this visit: Happy early birthday! I'm glad to hear that you completed treatment and are recovering well.  I placed an order for colonoscopy which I'd like you to schedule in a couple months once you've recovered more. Please talk to your urologist at your follow up about clearance for this procedure as well.  Your blood pressure looks great so we don't need to make any changes today. We can follow up in about 6 months for labwork and recheck.    Some additional health maintenance measures we should update are: Health Maintenance Due  Topic Date Due   Zoster Vaccines- Shingrix (1 of 2) Never done   PNA vac Low Risk Adult (1 of 2 - PCV13) Never done   COVID-19 Vaccine (3 - Pfizer risk series) 02/07/2020     Call the clinic at [phone] if your symptoms worsen or you have any concerns.   Thank you for allowing me to take part in your care,  Dr. Doristine Mango

## 2021-03-26 NOTE — Progress Notes (Signed)
   SUBJECTIVE:   CHIEF COMPLAINT / HPI: BP f/u, f/u prostate cancer treatment  Prostate cancer- completed radiation. Continuing chemotherapy. Follow up in about 3 weeks with urology. Doing well overall. Trying to increase his activity and lose weight but feeling a little more tired than usual and improving.   Colonoscopy- previous h/o high grade lesions in 2019. Repeat due next month.   Dental lesions- 2 teeth only and desires to get them pulled and get dentures. He eats a normal diet and denies problems with chewing anything, even pork chops. Has no pain.  HTN- adherent with treatment. Asymptomatic.   OBJECTIVE:   BP 110/70   Pulse 67   Ht 6' (1.829 m)   Wt 213 lb 12.8 oz (97 kg)   SpO2 98%   BMI 29.00 kg/m   Physical Exam Vitals and nursing note reviewed.  Constitutional:      General: He is not in acute distress.    Appearance: He is normal weight. He is not ill-appearing or toxic-appearing.  HENT:     Head: Normocephalic.     Right Ear: External ear normal.     Left Ear: External ear normal.     Nose: Nose normal.     Mouth/Throat:     Mouth: Mucous membranes are moist.     Pharynx: Oropharynx is clear.     Comments: Healthy gingiva Neck:     Thyroid: No thyroid mass or thyromegaly.  Cardiovascular:     Rate and Rhythm: Normal rate and regular rhythm.  Pulmonary:     Effort: Pulmonary effort is normal.     Breath sounds: Normal breath sounds.  Abdominal:     General: Abdomen is flat.     Palpations: Abdomen is soft.     Tenderness: no abdominal tenderness  Musculoskeletal:     Right lower leg: No edema.     Left lower leg: No edema.  Lymphadenopathy:     Cervical: No cervical adenopathy.  Skin:    General: Skin is warm and dry.     Capillary Refill: Capillary refill takes less than 2 seconds.     Comments: Vitiligo of upper extremities  Neurological:     General: No focal deficit present.     Mental Status: He is alert and oriented to person, place, and  time.     Gait: Gait is intact.  Psychiatric:        Mood and Affect: Mood normal.        Behavior: Behavior normal.    ASSESSMENT/PLAN:   Healthcare maintenance Given h/o high grade colonic polyps, due for 3 year follow up colonoscopy (referral sent). Don't want to delay too far in setting of known prostate cancer but since he just completed radiation, would be wise to allow time for rectal tissue to heal prior to colonoscopy. Recommended discussing with his urologist at follow up.  Poor dentition No lesions on exam and no pain or difficulty with chewing.  Recommend follow up for dentures  Malignant neoplasm of prostate [facility]) Patient feeling well overall. Continue treatment and follow up with urology.   Hypertension Well controlled on regimen for HF. Remains asymptomatic     [place]

## 2021-03-27 ENCOUNTER — Other Ambulatory Visit: Payer: Self-pay | Admitting: *Deleted

## 2021-03-27 DIAGNOSIS — C61 Malignant neoplasm of prostate: Secondary | ICD-10-CM

## 2021-03-27 MED ORDER — ABIRATERONE ACETATE 250 MG PO TABS: 1000.0000 mg | ORAL_TABLET | Freq: Every day | ORAL | 0 refills | Status: DC

## 2021-03-28 DIAGNOSIS — Z0000 Encounter for general adult medical examination without abnormal findings: Secondary | ICD-10-CM | POA: Insufficient documentation

## 2021-03-28 NOTE — Assessment & Plan Note (Signed)
Patient feeling well overall. Continue treatment and follow up with urology.

## 2021-03-28 NOTE — Assessment & Plan Note (Signed)
Given h/o high grade colonic polyps, due for 3 year follow up colonoscopy (referral sent). Don't want to delay too far in setting of known prostate cancer but since he just completed radiation, would be wise to allow time for rectal tissue to heal prior to colonoscopy. Recommended discussing with his urologist at follow up.

## 2021-03-28 NOTE — Assessment & Plan Note (Signed)
Well controlled on regimen for HF. Remains asymptomatic

## 2021-03-28 NOTE — Assessment & Plan Note (Signed)
No lesions on exam and no pain or difficulty with chewing.  Recommend follow up for dentures

## 2021-03-30 ENCOUNTER — Other Ambulatory Visit (HOSPITAL_COMMUNITY): Payer: Self-pay

## 2021-03-30 ENCOUNTER — Other Ambulatory Visit: Payer: Self-pay | Admitting: Student in an Organized Health Care Education/Training Program

## 2021-03-30 DIAGNOSIS — I5022 Chronic systolic (congestive) heart failure: Secondary | ICD-10-CM

## 2021-03-30 MED ORDER — LOSARTAN POTASSIUM 25 MG PO TABS
25.0000 mg | ORAL_TABLET | Freq: Every day | ORAL | 0 refills | Status: DC
  Filled 2021-03-30: qty 90, 90d supply, fill #0

## 2021-04-03 ENCOUNTER — Other Ambulatory Visit (HOSPITAL_COMMUNITY): Payer: Self-pay

## 2021-04-06 ENCOUNTER — Other Ambulatory Visit (HOSPITAL_COMMUNITY): Payer: Self-pay

## 2021-04-09 ENCOUNTER — Other Ambulatory Visit (HOSPITAL_COMMUNITY): Payer: Self-pay

## 2021-04-09 ENCOUNTER — Other Ambulatory Visit: Payer: Self-pay | Admitting: Student in an Organized Health Care Education/Training Program

## 2021-04-09 ENCOUNTER — Other Ambulatory Visit: Payer: Self-pay | Admitting: Family Medicine

## 2021-04-09 MED ORDER — CARVEDILOL 3.125 MG PO TABS
3.1250 mg | ORAL_TABLET | Freq: Two times a day (BID) | ORAL | 11 refills | Status: DC
  Filled 2021-04-09: qty 60, 30d supply, fill #0
  Filled 2021-05-31: qty 60, 30d supply, fill #1
  Filled 2021-07-03: qty 60, 30d supply, fill #2
  Filled 2021-07-31: qty 60, 30d supply, fill #3
  Filled 2021-09-03: qty 60, 30d supply, fill #4
  Filled 2021-10-08 – 2021-10-09 (×2): qty 60, 30d supply, fill #5
  Filled 2021-10-09: qty 60, 30d supply, fill #0
  Filled 2021-11-15: qty 60, 30d supply, fill #1
  Filled 2021-12-17: qty 60, 30d supply, fill #2
  Filled 2022-01-08 (×2): qty 60, 30d supply, fill #3
  Filled 2022-02-24: qty 60, 30d supply, fill #4
  Filled 2022-03-26: qty 60, 30d supply, fill #5

## 2021-04-09 MED ORDER — CARVEDILOL 3.125 MG PO TABS
ORAL_TABLET | ORAL | 1 refills | Status: DC
  Filled 2021-04-09: qty 180, fill #0

## 2021-04-09 MED FILL — Selenium Sulfide Lotion 2.5%: CUTANEOUS | 30 days supply | Qty: 118 | Fill #2 | Status: AC

## 2021-04-09 NOTE — Progress Notes (Signed)
Refilled Carvedilol. 

## 2021-04-17 ENCOUNTER — Inpatient Hospital Stay (HOSPITAL_BASED_OUTPATIENT_CLINIC_OR_DEPARTMENT_OTHER): Payer: Medicare Other | Admitting: Oncology

## 2021-04-17 ENCOUNTER — Inpatient Hospital Stay: Payer: Medicare Other | Attending: Oncology

## 2021-04-17 ENCOUNTER — Other Ambulatory Visit: Payer: Self-pay

## 2021-04-17 ENCOUNTER — Telehealth: Payer: Self-pay | Admitting: *Deleted

## 2021-04-17 VITALS — BP 96/63 | HR 80 | Temp 99.1°F | Resp 18 | Ht 72.0 in | Wt 207.3 lb

## 2021-04-17 DIAGNOSIS — Z923 Personal history of irradiation: Secondary | ICD-10-CM | POA: Diagnosis not present

## 2021-04-17 DIAGNOSIS — C61 Malignant neoplasm of prostate: Secondary | ICD-10-CM | POA: Diagnosis not present

## 2021-04-17 DIAGNOSIS — Z79899 Other long term (current) drug therapy: Secondary | ICD-10-CM | POA: Insufficient documentation

## 2021-04-17 DIAGNOSIS — I10 Essential (primary) hypertension: Secondary | ICD-10-CM | POA: Diagnosis not present

## 2021-04-17 DIAGNOSIS — Z7952 Long term (current) use of systemic steroids: Secondary | ICD-10-CM | POA: Insufficient documentation

## 2021-04-17 DIAGNOSIS — R5383 Other fatigue: Secondary | ICD-10-CM | POA: Insufficient documentation

## 2021-04-17 LAB — CMP (CANCER CENTER ONLY)
ALT: 64 U/L — ABNORMAL HIGH (ref 0–44)
AST: 36 U/L (ref 15–41)
Albumin: 3.7 g/dL (ref 3.5–5.0)
Alkaline Phosphatase: 63 U/L (ref 38–126)
Anion gap: 9 (ref 5–15)
BUN: 22 mg/dL (ref 8–23)
CO2: 27 mmol/L (ref 22–32)
Calcium: 8.9 mg/dL (ref 8.9–10.3)
Chloride: 106 mmol/L (ref 98–111)
Creatinine: 1.61 mg/dL — ABNORMAL HIGH (ref 0.61–1.24)
GFR, Estimated: 47 mL/min — ABNORMAL LOW (ref >60)
Glucose, Bld: 122 mg/dL — ABNORMAL HIGH (ref 70–99)
Potassium: 5.1 mmol/L (ref 3.5–5.1)
Sodium: 142 mmol/L (ref 135–145)
Total Bilirubin: 1 mg/dL (ref 0.3–1.2)
Total Protein: 7.4 g/dL (ref 6.5–8.1)

## 2021-04-17 LAB — CBC WITH DIFFERENTIAL (CANCER CENTER ONLY)
Abs Immature Granulocytes: 0.05 10*3/uL (ref 0.00–0.07)
Basophils Absolute: 0 10*3/uL (ref 0.0–0.1)
Basophils Relative: 0 %
Eosinophils Absolute: 0.1 10*3/uL (ref 0.0–0.5)
Eosinophils Relative: 1 %
HCT: 37.6 % — ABNORMAL LOW (ref 39.0–52.0)
Hemoglobin: 12.3 g/dL — ABNORMAL LOW (ref 13.0–17.0)
Immature Granulocytes: 1 %
Lymphocytes Relative: 6 %
Lymphs Abs: 0.4 10*3/uL — ABNORMAL LOW (ref 0.7–4.0)
MCH: 29.5 pg (ref 26.0–34.0)
MCHC: 32.7 g/dL (ref 30.0–36.0)
MCV: 90.2 fL (ref 80.0–100.0)
Monocytes Absolute: 0.6 10*3/uL (ref 0.1–1.0)
Monocytes Relative: 9 %
Neutro Abs: 4.8 10*3/uL (ref 1.7–7.7)
Neutrophils Relative %: 83 %
Platelet Count: 211 10*3/uL (ref 150–400)
RBC: 4.17 MIL/uL — ABNORMAL LOW (ref 4.22–5.81)
RDW: 14.5 % (ref 11.5–15.5)
WBC Count: 5.9 10*3/uL (ref 4.0–10.5)
nRBC: 0 % (ref 0.0–0.2)

## 2021-04-17 NOTE — Telephone Encounter (Signed)
Refill request for Zytiga 250 mg tabs faxed to [facility], fax confirmation received.

## 2021-04-17 NOTE — Progress Notes (Signed)
Hematology and Oncology Follow Up Visit  [name] [id number] June 01, 1956 65 y.o. 04/17/2021 2:36 PM [name], DO   Principle Diagnosis: 65 year old ma with stage T1c adenocarcinoma of the prostate diagnosed in November 2021.  He presented with a locally advanced disease, Gleason score of 4+5, and PSA of 71.3.   Prior Therapy:  He is status post a prostate biopsy which showed a Gleason score 4+5 = 08 August 2020.  He is status post definitive radiation therapy to the prostate with IMRT and seed implant boost completed in June 2022.  Current therapy:  Androgen deprivation therapy under the care of Dr. Tresa Moore.  Zytiga 1000 mg daily with prednisone started in February 2022.    Interim History: [name] is here for return visit.  Since last visit, he reports no major changes in his health.  He completed radiation therapy without any residual complications.  He does report some mild fatigue and tiredness at times but no other complications related to Zytiga.  He denies any nausea, vomiting or abdominal pain.  Denies any diarrhea or worsening neuropathy.     Medications: Updated on review. Current Outpatient Medications  Medication Sig Dispense Refill   abiraterone acetate (ZYTIGA) 250 MG tablet Take 4 tablets (1,000 mg total) by mouth daily. Take on an empty stomach 1 hour before or 2 hours after a meal 120 tablet 0   amLODipine (NORVASC) 10 MG tablet TAKE 1 TABLET (10 MG TOTAL) BY MOUTH DAILY. 90 tablet 1   atorvastatin (LIPITOR) 40 MG tablet Take 1 tablet (40 mg total) by mouth daily. 90 tablet 3   carvedilol (COREG) 3.125 MG tablet Take 1 tablet (3.125 mg total) by mouth 2 (two) times daily with a meal. 60 tablet 11   losartan (COZAAR) 25 MG tablet Take 1 tablet (25 mg total) by mouth at bedtime. 90 tablet 0   nitroGLYCERIN (NITROSTAT) 0.4 MG SL tablet PLACE 1 TABLET UNDER THE TONGUE EVERY 5 MINUTES AS NEEDED FOR CHEST PAIN. 10 tablet 0    predniSONE (DELTASONE) 5 MG tablet TAKE 1 TABLET BY MOUTH ONCE A DAY WITH BREAKFAST 90 tablet 3   selenium sulfide (SELSUN) 2.5 % shampoo APPLY TOPICALLY DAILY AS NEEDED FOR IRRITATION. 120 mL 12   spironolactone (ALDACTONE) 25 MG tablet Take 0.5 tablets (12.5 mg total) by mouth at bedtime. 90 tablet 0   tamsulosin (FLOMAX) 0.4 MG CAPS capsule Take 1 capsule by mouth every day 30 capsule 11   traMADol (ULTRAM) 50 MG tablet Take 1 tablet (50 mg total) by mouth every 6 (six) hours as needed. 20 tablet 0   triamcinolone cream (KENALOG) 0.5 % APPLY TO THE AFFECTED AREAS DAILY AS NEEDED. 30 g 3   No current facility-administered medications for this visit.     Allergies: No Known Allergies    Physical Exam:  Blood pressure 96/63, pulse 80, temperature 99.1 F (37.3 C), temperature source Oral, resp. rate 18, height 6' (1.829 m), weight 207 lb 4.8 oz (94 kg), SpO2 100 %.   ECOG:  1   General appearance: Alert, awake without any distress. Head: Atraumatic without abnormalities Oropharynx: Without any thrush or ulcers. Eyes: No scleral icterus. Lymph nodes: No lymphadenopathy noted in the cervical, supraclavicular, or axillary nodes Heart:regular rate and rhythm, without any murmurs or gallops.   Lung: Clear to auscultation without any rhonchi, wheezes or dullness to percussion. Abdomin: Soft, nontender without any shifting dullness or ascites. Musculoskeletal: No clubbing or cyanosis. Neurological: No motor or  sensory deficits. Skin: No rashes or lesions.     Lab Results: Lab Results  Component Value Date   WBC 8.7 01/19/2021   HGB 13.0 01/19/2021   HCT 39.9 01/19/2021   MCV 89.1 01/19/2021   PLT 217 01/19/2021     Chemistry      Component Value Date/Time   NA 138 01/19/2021 1251   NA 139 06/02/2020 1124   K 4.7 01/19/2021 1251   CL 104 01/19/2021 1251   CO2 25 01/19/2021 1251   BUN 27 (H) 01/19/2021 1251   BUN 17 06/02/2020 1124   CREATININE 1.39 (H) 01/19/2021  1251   CREATININE 1.74 (H) 01/12/2021 1504   CREATININE 1.35 (H) 03/22/2016 1127      Component Value Date/Time   CALCIUM 9.3 01/19/2021 1251   ALKPHOS 43 01/19/2021 1251   AST 45 (H) 01/19/2021 1251   AST 19 01/12/2021 1504   ALT 42 01/19/2021 1251   ALT 16 01/12/2021 1504   BILITOT 0.5 01/19/2021 1251   BILITOT 0.5 01/12/2021 1504       Impression and Plan:   65 year old man with:   1.    Prostate cancer diagnosed in November 2021.  He was found to have high risk localized disease with Gleason score of 4+5 = 9, PSA of 71.   Is currently on Zytiga without any major complications.  Risks and benefits of continuing this treatment were discussed at this time.  Plan is to continue 2 years of therapy.  Complications including hypertension, edema, adrenal sufficiency among others.  He is agreeable to continue at this time.   2.  Androgen deprivation therapy: Complications associated with this treatment were reviewed at this time.  These include weight gain, hot flashes among others.  He has no issues continuing at this time.   3.  Hypertension: His blood pressure is close to normal range at this time.  We will continue to monitor on Zytiga.   5.  Follow-up: he will return in 4 months for repeat follow-up.     30  minutes were dedicated to this visit.  Time spent on reviewing laboratory data, disease status update, addressing complications related to cancer and cancer therapy.       Zola Button, MD 7/19/20222:36 PM

## 2021-04-18 LAB — PROSTATE-SPECIFIC AG, SERUM (LABCORP): Prostate Specific Ag, Serum: <0.1 ng/mL (ref 0.0–4.0)

## 2021-04-26 ENCOUNTER — Telehealth: Payer: Self-pay | Admitting: *Deleted

## 2021-04-26 NOTE — Telephone Encounter (Signed)
Received fax - copy of letter sent to patient dated 04/25/21. Letter stated [facility] had been attempting unsuccessfully to contact patient to schedule delivery of medication from refill order received from Dr. Hazeline Junker office. Contacted patient and informed him of same. Gave patient phone number of pharmacy in letter [phone] and asked him to call them. He stated he would call them today.

## 2021-04-30 ENCOUNTER — Other Ambulatory Visit (HOSPITAL_COMMUNITY): Payer: Self-pay

## 2021-05-08 NOTE — Progress Notes (Addendum)
Patient reports no pain, dysuria, hematuria, frequency, urgency, diarrhea, constipation, or skin changes but is having a weak urine stream, that starts and stops. Patient is experiencing some increased fatigue and nocturia x5.  I-PSS score 14  Meaningful use questions complete  Patient made aware of his telephone appointment and expressed understanding of it.

## 2021-05-09 ENCOUNTER — Ambulatory Visit
Admission: RE | Admit: 2021-05-09 | Discharge: 2021-05-09 | Disposition: A | Discharge status: Home / Self Care | Payer: Medicare Other | Source: Ambulatory Visit | Attending: Urology | Admitting: Urology

## 2021-05-09 DIAGNOSIS — C61 Malignant neoplasm of prostate: Secondary | ICD-10-CM

## 2021-05-09 NOTE — Progress Notes (Signed)
  Radiation Oncology         [phone]) [phone] ________________________________  Name: [name] MRN: [id number]  Date: 03/22/2021  DOB: 1956/01/21  End of Treatment Note  Diagnosis:   65 y.o. gentleman with Stage T1c adenocarcinoma of the prostate with Gleason score of 4+5, and PSA of 71.3.     Indication for treatment:  Curative, Definitive Radiotherapy       Radiation treatment dates:    02/15/21 - 03/22/21: IMRT prostate and pelvic nodes  01/23/21: Brachytherapy boost  Site/dose:          The prostate and pelvic lymph nodes were treated to 45 Gy in 25 fractions of 1.8 Gy, to supplement an up-front prostate seed implant boost of 110 Gy to achieve a total nominal dose of 155 Gy.  2. Insertion of radioactive I-125 seeds into the prostate gland; 110 Gy, boost therapy.  Beams/energy:   The patient was treated with IMRT using volumetric arc therapy delivering 6 MV X-rays to clockwise and counterclockwise circumferential arcs with a 90 degree collimator offset to avoid dose scalloping.  Image guidance was performed with daily cone beam CT prior to each fraction to align to gold markers in the prostate and assure proper bladder and rectal fill volumes.  Immobilization was achieved with [employer] custom mold.  Narrative: The patient tolerated radiation treatment relatively well with only minor urinary irritation and modest fatigue.  He did report some dysuria, nocturia 3-4 times per night and weak stream all of which were improved on Flomax daily.  He did not experience any diarrhea or bowel issues.  Plan: The patient has completed radiation treatment. He will return to radiation oncology clinic for routine followup in one month. I advised him to call or return sooner if he has any questions or concerns related to his recovery or treatment. ________________________________  Sheral Apley. Tammi Klippel, M.D.

## 2021-05-09 NOTE — Progress Notes (Signed)
Radiation Oncology         [phone] ________________________________  Name: [name] MRN: [id number]  Date: 05/09/2021  DOB: August 06, 1956  Post Treatment Note  CC: Sharion Settler, DO  Alexis Frock, MD  Diagnosis:   65 y.o. gentleman with Stage T1c adenocarcinoma of the prostate with Gleason score of 4+5, and PSA of 71.3.     Interval Since Last Radiation:  6.5 weeks (concurrent with ADT, Eligard injection 12/11/2020) 02/15/21 - 03/22/21: The prostate and pelvic lymph nodes were treated to 45 Gy in 25 fractions of 1.8 Gy, to supplement an up-front prostate seed implant boost of 110 Gy to achieve a total nominal dose of 155 Gy.   01/23/21: Insertion of radioactive I-125 seeds into the prostate gland; 110 Gy, boost therapy.  Narrative:  I spoke with the patient to conduct his routine scheduled 1 month follow up visit via telephone to spare the patient unnecessary potential exposure in the healthcare setting during the current COVID-19 pandemic.  The patient was notified in advance and gave permission to proceed with this visit format.  He tolerated radiation treatment relatively well with only minor urinary irritation and modest fatigue.  He did report some dysuria, nocturia 3-4 times per night and weak stream all of which were improved on Flomax daily.  He did not experience any diarrhea or bowel issues.                              On review of systems, the patient states that he is doing very well in general.  He reports complete resolution of the dysuria and improved flow of stream but continues with nocturia 4-5 times per night as well as moderate fatigue.  He has continued taking Flomax daily as prescribed.  He denies gross hematuria, straining to void, incomplete bladder emptying or incontinence.  His current IPSS score is 14, indicating moderate urinary symptoms.  He denies abdominal pain, nausea, vomiting, diarrhea or constipation.  He reports a healthy appetite and is  maintaining his weight.  He has continued to tolerate the ADT and Zytiga fairly well despite fatigue and occasional hot flashes.  He had a recent follow-up visit with Dr. Alen Blew on 04/17/2021 with a PSA drawn prior to that visit showing an excellent response to treatment, now undetectable.  He feels that his LUTS are gradually improving and overall, he is quite pleased with his progress to date.  ALLERGIES:  has No Known Allergies.  Meds: Current Outpatient Medications  Medication Sig Dispense Refill   abiraterone acetate (ZYTIGA) 250 MG tablet Take 4 tablets (1,000 mg total) by mouth daily. Take on an empty stomach 1 hour before or 2 hours after a meal 120 tablet 0   amLODipine (NORVASC) 10 MG tablet TAKE 1 TABLET (10 MG TOTAL) BY MOUTH DAILY. 90 tablet 1   atorvastatin (LIPITOR) 40 MG tablet Take 1 tablet (40 mg total) by mouth daily. 90 tablet 3   carvedilol (COREG) 3.125 MG tablet Take 1 tablet (3.125 mg total) by mouth 2 (two) times daily with a meal. 60 tablet 11   losartan (COZAAR) 25 MG tablet Take 1 tablet (25 mg total) by mouth at bedtime. 90 tablet 0   predniSONE (DELTASONE) 5 MG tablet TAKE 1 TABLET BY MOUTH ONCE A DAY WITH BREAKFAST 90 tablet 3   selenium sulfide (SELSUN) 2.5 % shampoo APPLY TOPICALLY DAILY AS NEEDED FOR IRRITATION. 120 mL 12   spironolactone (ALDACTONE)  25 MG tablet Take 0.5 tablets (12.5 mg total) by mouth at bedtime. 90 tablet 0   tamsulosin (FLOMAX) 0.4 MG CAPS capsule Take 1 capsule by mouth every day 30 capsule 11   traMADol (ULTRAM) 50 MG tablet Take 1 tablet (50 mg total) by mouth every 6 (six) hours as needed. 20 tablet 0   triamcinolone cream (KENALOG) 0.5 % APPLY TO THE AFFECTED AREAS DAILY AS NEEDED. 30 g 3   nitroGLYCERIN (NITROSTAT) 0.4 MG SL tablet PLACE 1 TABLET UNDER THE TONGUE EVERY 5 MINUTES AS NEEDED FOR CHEST PAIN. (Patient not taking: Reported on 05/08/2021) 10 tablet 0   No current facility-administered medications for this encounter.     Physical Findings:  vitals were not taken for this visit.  Pain Assessment Pain Score: 0-No pain/10 Unable to assess due to telephone follow-up visit format.  Lab Findings: Lab Results  Component Value Date   WBC 5.9 04/17/2021   HGB 12.3 (L) 04/17/2021   HCT 37.6 (L) 04/17/2021   MCV 90.2 04/17/2021   PLT 211 04/17/2021     Radiographic Findings: No results found.  Impression/Plan: 1. 65 y.o. gentleman with Stage T1c adenocarcinoma of the prostate with Gleason score of 4+5, and PSA of 71.3.    He will continue to follow up with urology and medical oncology for ongoing management of his systemic disease and has an appointment scheduled for labs at [facility] urology on 05/14/2021 and a follow-up visit with Dr. Tresa Moore the following week.  He had a recent visit with Dr. Alen Blew on 04/17/2021 and is scheduled for his next follow-up in October 2022.  His most recent PSA on 04/17/2021 shows an excellent response to treatment, now undetectable.  He continues to tolerate the ADT and Zytiga therapy fairly well and plans to complete a 2-year course of therapy, under the care and direction of Dr. Alen Blew and Dr. Tresa Moore.  His next Eligard ADT injection will be due in September 2022 and he understands what to expect with regards to PSA monitoring going forward. I will look forward to following his response to treatment via correspondence with urology, and would be happy to continue to participate in his care if clinically indicated. I talked to the patient about what to expect in the future, including his risk for erectile dysfunction and rectal bleeding. I encouraged him to call or return to the office if he has any questions regarding his previous radiation or possible radiation side effects. He was comfortable with this plan and will follow up as needed.     Nicholos Johns, PA-C

## 2021-05-11 ENCOUNTER — Other Ambulatory Visit (HOSPITAL_COMMUNITY): Payer: Self-pay

## 2021-05-14 DIAGNOSIS — C61 Malignant neoplasm of prostate: Secondary | ICD-10-CM | POA: Diagnosis not present

## 2021-05-21 DIAGNOSIS — C61 Malignant neoplasm of prostate: Secondary | ICD-10-CM | POA: Diagnosis not present

## 2021-05-23 ENCOUNTER — Other Ambulatory Visit: Payer: Self-pay | Admitting: Urology

## 2021-05-23 DIAGNOSIS — C61 Malignant neoplasm of prostate: Secondary | ICD-10-CM

## 2021-05-31 ENCOUNTER — Other Ambulatory Visit (HOSPITAL_COMMUNITY): Payer: Self-pay

## 2021-06-04 ENCOUNTER — Encounter: Payer: Self-pay | Admitting: Internal Medicine

## 2021-06-08 ENCOUNTER — Other Ambulatory Visit (HOSPITAL_COMMUNITY): Payer: Self-pay

## 2021-06-11 ENCOUNTER — Other Ambulatory Visit (HOSPITAL_COMMUNITY): Payer: Self-pay

## 2021-06-11 ENCOUNTER — Other Ambulatory Visit: Payer: Self-pay | Admitting: Family Medicine

## 2021-06-11 DIAGNOSIS — I5022 Chronic systolic (congestive) heart failure: Secondary | ICD-10-CM

## 2021-06-11 MED ORDER — LOSARTAN POTASSIUM 25 MG PO TABS
25.0000 mg | ORAL_TABLET | Freq: Every day | ORAL | 0 refills | Status: DC
  Filled 2021-06-11 – 2021-07-03 (×2): qty 90, 90d supply, fill #0

## 2021-06-11 MED FILL — Prednisone Tab 5 MG: ORAL | 90 days supply | Qty: 90 | Fill #0 | Status: AC

## 2021-06-18 DIAGNOSIS — C61 Malignant neoplasm of prostate: Secondary | ICD-10-CM | POA: Diagnosis not present

## 2021-06-18 DIAGNOSIS — Z5111 Encounter for antineoplastic chemotherapy: Secondary | ICD-10-CM | POA: Diagnosis not present

## 2021-06-22 ENCOUNTER — Telehealth: Payer: Self-pay | Admitting: Adult Health

## 2021-06-22 NOTE — Telephone Encounter (Signed)
Called and attempted to reach patient about scheduling his SCP visit.  Unable to reach patient.  Wilber Bihari, NP

## 2021-06-29 ENCOUNTER — Telehealth: Payer: Self-pay

## 2021-06-29 NOTE — Telephone Encounter (Signed)
Patient calls nurse line regarding referral for colonoscopy. Per chart review, referral was placed in 02/2021. Provided patient with contact information for [facility].   Talbot Grumbling, RN

## 2021-07-02 ENCOUNTER — Encounter: Payer: Self-pay | Admitting: Internal Medicine

## 2021-07-03 ENCOUNTER — Other Ambulatory Visit (HOSPITAL_COMMUNITY): Payer: Self-pay

## 2021-07-17 ENCOUNTER — Other Ambulatory Visit: Payer: Self-pay | Admitting: *Deleted

## 2021-07-17 DIAGNOSIS — C61 Malignant neoplasm of prostate: Secondary | ICD-10-CM

## 2021-07-17 MED ORDER — ABIRATERONE ACETATE 250 MG PO TABS: 1000.0000 mg | ORAL_TABLET | Freq: Every day | ORAL | 0 refills | Status: DC

## 2021-07-18 ENCOUNTER — Telehealth: Payer: Self-pay

## 2021-07-18 ENCOUNTER — Other Ambulatory Visit (HOSPITAL_COMMUNITY): Payer: Self-pay

## 2021-07-18 NOTE — Telephone Encounter (Signed)
Oral Oncology Patient Advocate Encounter  Was successful in securing patient a $8000 grant from [employer] to provide copayment coverage for Zytiga.  This will keep the out of pocket expense at $0.     [employer] ID: [id number]  I have spoken with the patient.   The billing information is as follows and has been shared with [place]: [id number] PCN: [id number] Member ID: [id number] Group ID: [id number] Dates of Eligibility: 06/18/21 through 06/17/22  Fund:  [place] [facility] [place] Phone [phone] Fax [phone] 07/18/2021 11:54 AM

## 2021-07-25 ENCOUNTER — Telehealth: Payer: Self-pay | Admitting: *Deleted

## 2021-07-25 NOTE — Telephone Encounter (Signed)
Thanks- will proceed as scheduled  

## 2021-07-25 NOTE — Telephone Encounter (Signed)
Ok to keep previsit and [facility] colon as scheduled

## 2021-07-25 NOTE — Telephone Encounter (Signed)
PC to patient, informed him we received letter by fax from [place] regarding his zytiga prescription stating they had been trying to reach him but were unsuccessful.  Phone number on faxed letter given to patient [phone].  He states he will call pharmacy today.

## 2021-07-25 NOTE — Telephone Encounter (Signed)
Dr Hilarie Fredrickson,  This pt is scheduled for a colon with you 12-2 Friday.  He was diagnosed with Prostate cancer 07-2020- he completed radiation 03-22-2021 and he is on an [place].  Per Protocol, is he ok to proceed with his colon?  He has a hx of TA polyps-   Please adviseLelan Pons PV

## 2021-07-31 ENCOUNTER — Ambulatory Visit: Payer: Medicare Other | Admitting: Family Medicine

## 2021-07-31 ENCOUNTER — Other Ambulatory Visit: Payer: Self-pay | Admitting: Student in an Organized Health Care Education/Training Program

## 2021-07-31 ENCOUNTER — Other Ambulatory Visit (HOSPITAL_COMMUNITY): Payer: Self-pay

## 2021-07-31 ENCOUNTER — Other Ambulatory Visit: Payer: Self-pay

## 2021-07-31 MED FILL — Selenium Sulfide Lotion 2.5%: CUTANEOUS | 30 days supply | Qty: 120 | Fill #0 | Status: AC

## 2021-07-31 MED FILL — Spironolactone Tab 25 MG: ORAL | 90 days supply | Qty: 45 | Fill #0 | Status: AC

## 2021-08-06 ENCOUNTER — Other Ambulatory Visit (HOSPITAL_COMMUNITY): Payer: Self-pay

## 2021-08-08 ENCOUNTER — Telehealth: Payer: Self-pay | Admitting: *Deleted

## 2021-08-08 NOTE — Telephone Encounter (Signed)
Patient called - He states his Fabio Asa will run out in December. He called [facility] and they said they will need a new prescription then. Advised him that pharmacy will send refill request to Dr. Alen Blew when needed.  Patient states he thinks he has lost weight in last 2 weeks. This nurse reported his last weight here in July was 207 lbs and asked what his weight is now. He states he does not have scale, but said his clothes fit different. Patient reports good appetite and denies nausea/vomiting/diarrhea. Patient said if he can find a scale he will weigh and rep[ort to office if weight has decreased. Patient verbalized understanding of this direction.   Advised patient that he will get weight checked, and that treatment and medication will be discussed at his appt with Dr. Alen Blew 08/17/21 - next Friday. Patient expressed appreciation for appt reminder - stated he had forgotten he had appt.

## 2021-08-10 ENCOUNTER — Other Ambulatory Visit (HOSPITAL_COMMUNITY): Payer: Self-pay

## 2021-08-10 ENCOUNTER — Ambulatory Visit (AMBULATORY_SURGERY_CENTER): Payer: Medicare Other | Admitting: *Deleted

## 2021-08-10 ENCOUNTER — Other Ambulatory Visit: Payer: Self-pay

## 2021-08-10 VITALS — Ht 72.0 in | Wt 215.0 lb

## 2021-08-10 DIAGNOSIS — Z86010 Personal history of colonic polyps: Secondary | ICD-10-CM

## 2021-08-10 MED ORDER — PEG 3350-KCL-NA BICARB-NACL 420 G PO SOLR
4000.0000 mL | Freq: Once | ORAL | 0 refills | Status: AC
Start: 2021-08-10 — End: 2021-08-14
  Filled 2021-08-10: qty 4000, 1d supply, fill #0

## 2021-08-10 NOTE — Progress Notes (Signed)
PV completed over the phone. Pt verified name, DOB, address and insurance during PV today.  Pt mailed instruction packet with copy of consent form to read and not return, and instructions.  Pt encouraged to call with questions or issues.  If pt has My chart, procedure instructions sent via My Chart   No egg or soy allergy known to patient  No issues known to pt with past sedation with any surgeries or procedures Patient denies ever being told they had issues or difficulty with intubation  No FH of Malignant Hyperthermia Pt is not on diet pills Pt is not on  home 02  Pt is not on blood thinners  Pt denies issues with constipation  No A fib or A flutter  Pt is fully vaccinated  for Covid   Due to the COVID-19 pandemic we are asking patients to follow certain guidelines in PV and the [place]   Pt aware of COVID protocols and [facility] guidelines   Dr Hilarie Fredrickson ok's colon due to prostate cancer this year - see 10-26 TE

## 2021-08-13 ENCOUNTER — Other Ambulatory Visit (HOSPITAL_COMMUNITY): Payer: Self-pay

## 2021-08-16 ENCOUNTER — Other Ambulatory Visit: Payer: Self-pay | Admitting: *Deleted

## 2021-08-16 DIAGNOSIS — C61 Malignant neoplasm of prostate: Secondary | ICD-10-CM

## 2021-08-16 MED ORDER — ABIRATERONE ACETATE 250 MG PO TABS: 1000.0000 mg | ORAL_TABLET | Freq: Every day | ORAL | 0 refills | Status: DC

## 2021-08-17 ENCOUNTER — Other Ambulatory Visit: Payer: Self-pay

## 2021-08-17 ENCOUNTER — Inpatient Hospital Stay: Payer: Medicare Other | Attending: Oncology

## 2021-08-17 ENCOUNTER — Inpatient Hospital Stay (HOSPITAL_BASED_OUTPATIENT_CLINIC_OR_DEPARTMENT_OTHER): Payer: Medicare Other | Admitting: Oncology

## 2021-08-17 VITALS — BP 113/90 | HR 69 | Temp 97.2°F | Resp 18 | Wt 219.2 lb

## 2021-08-17 DIAGNOSIS — Z923 Personal history of irradiation: Secondary | ICD-10-CM | POA: Insufficient documentation

## 2021-08-17 DIAGNOSIS — C61 Malignant neoplasm of prostate: Secondary | ICD-10-CM

## 2021-08-17 DIAGNOSIS — I10 Essential (primary) hypertension: Secondary | ICD-10-CM | POA: Diagnosis not present

## 2021-08-17 LAB — CMP (CANCER CENTER ONLY)
ALT: 17 U/L (ref 0–44)
AST: 18 U/L (ref 15–41)
Albumin: 3.7 g/dL (ref 3.5–5.0)
Alkaline Phosphatase: 61 U/L (ref 38–126)
Anion gap: 9 (ref 5–15)
BUN: 23 mg/dL (ref 8–23)
CO2: 25 mmol/L (ref 22–32)
Calcium: 9 mg/dL (ref 8.9–10.3)
Chloride: 105 mmol/L (ref 98–111)
Creatinine: 1.22 mg/dL (ref 0.61–1.24)
GFR, Estimated: >60 mL/min (ref >60)
Glucose, Bld: 113 mg/dL — ABNORMAL HIGH (ref 70–99)
Potassium: 4.5 mmol/L (ref 3.5–5.1)
Sodium: 139 mmol/L (ref 135–145)
Total Bilirubin: 0.4 mg/dL (ref 0.3–1.2)
Total Protein: 7.1 g/dL (ref 6.5–8.1)

## 2021-08-17 LAB — CBC WITH DIFFERENTIAL (CANCER CENTER ONLY)
Abs Immature Granulocytes: 0.05 10*3/uL (ref 0.00–0.07)
Basophils Absolute: 0 10*3/uL (ref 0.0–0.1)
Basophils Relative: 0 %
Eosinophils Absolute: 0.3 10*3/uL (ref 0.0–0.5)
Eosinophils Relative: 4 %
HCT: 34.8 % — ABNORMAL LOW (ref 39.0–52.0)
Hemoglobin: 11.5 g/dL — ABNORMAL LOW (ref 13.0–17.0)
Immature Granulocytes: 1 %
Lymphocytes Relative: 10 %
Lymphs Abs: 0.8 10*3/uL (ref 0.7–4.0)
MCH: 28.7 pg (ref 26.0–34.0)
MCHC: 33 g/dL (ref 30.0–36.0)
MCV: 86.8 fL (ref 80.0–100.0)
Monocytes Absolute: 0.6 10*3/uL (ref 0.1–1.0)
Monocytes Relative: 8 %
Neutro Abs: 6 10*3/uL (ref 1.7–7.7)
Neutrophils Relative %: 77 %
Platelet Count: 215 10*3/uL (ref 150–400)
RBC: 4.01 MIL/uL — ABNORMAL LOW (ref 4.22–5.81)
RDW: 14.1 % (ref 11.5–15.5)
WBC Count: 7.7 10*3/uL (ref 4.0–10.5)
nRBC: 0 % (ref 0.0–0.2)

## 2021-08-17 NOTE — Progress Notes (Signed)
Hematology and Oncology Follow Up Visit  [name] [id number] Jun 18, 1956 65 y.o. 08/17/2021 12:56 PM Tony Cleverly, DO   Principle Diagnosis: 65 year old man with locally advanced prostate cancer diagnosed in 2021.  He was found to have Gleason score of 4+5, and PSA of 71.3 at the time of diagnosis.   Prior Therapy:  He is status post a prostate biopsy which showed a Gleason score 4+5 = 08 August 2020.  He is status post definitive radiation therapy to the prostate with IMRT and seed implant boost completed in June 2022.  Current therapy:  Androgen deprivation therapy under the care of Dr. Tresa Moore.  He will be receiving that every 6 months.  Zytiga 1000 mg daily with prednisone started in February 2022.    Interim History: [name] returns today for repeat evaluation.  Since the last visit, he reports no major changes in his health.  He denies any recent hospitalizations or illnesses.  He denies any complications related to Zytiga.  He denies any excessive fatigue, tiredness or weakness.  He denies any bone pain or pathological fractures.  Performance status and quality of life remain excellent.     Medications: Reviewed without changes. Current Outpatient Medications  Medication Sig Dispense Refill   abiraterone acetate (ZYTIGA) 250 MG tablet Take 4 tablets (1,000 mg total) by mouth daily. Take on an empty stomach 1 hour before or 2 hours after a meal 120 tablet 0   amLODipine (NORVASC) 10 MG tablet TAKE 1 TABLET (10 MG TOTAL) BY MOUTH DAILY. 90 tablet 1   atorvastatin (LIPITOR) 40 MG tablet Take 1 tablet (40 mg total) by mouth daily. 90 tablet 3   carvedilol (COREG) 3.125 MG tablet Take 1 tablet by mouth 2 (two) times daily with a meal. 60 tablet 11   losartan (COZAAR) 25 MG tablet Take 1 tablet (25 mg total) by mouth at bedtime. 90 tablet 0   nitroGLYCERIN (NITROSTAT) 0.4 MG SL tablet PLACE 1 TABLET UNDER THE TONGUE EVERY 5 MINUTES AS NEEDED  FOR CHEST PAIN. (Patient not taking: No sig reported) 10 tablet 0   predniSONE (DELTASONE) 5 MG tablet TAKE 1 TABLET BY MOUTH ONCE A DAY WITH BREAKFAST 90 tablet 3   selenium sulfide (SELSUN) 2.5 % shampoo Apply 1 application topically daily as needed for irritation 120 mL 12   spironolactone (ALDACTONE) 25 MG tablet Take 0.5 tablets (12.5 mg total) by mouth at bedtime. 90 tablet 0   tamsulosin (FLOMAX) 0.4 MG CAPS capsule Take 1 capsule by mouth every day 30 capsule 11   traMADol (ULTRAM) 50 MG tablet Take 1 tablet (50 mg total) by mouth every 6 (six) hours as needed. 20 tablet 0   triamcinolone cream (KENALOG) 0.5 % APPLY TO THE AFFECTED AREAS DAILY AS NEEDED. 30 g 3   No current facility-administered medications for this visit.     Allergies: No Known Allergies    Physical Exam:  Blood pressure 113/90, pulse 69, temperature (!) 97.2 F (36.2 C), temperature source Temporal, resp. rate 18, weight 219 lb 4 oz (99.5 kg), SpO2 98 %.    ECOG:  1   General appearance: Comfortable appearing without any discomfort Head: Normocephalic without any trauma Oropharynx: Mucous membranes are moist and pink without any thrush or ulcers. Eyes: Pupils are equal and round reactive to light. Lymph nodes: No cervical, supraclavicular, inguinal or axillary lymphadenopathy.   Heart:regular rate and rhythm.  S1 and S2 without leg edema. Lung: Clear without any rhonchi or wheezes.  No dullness to percussion. Abdomin: Soft, nontender, nondistended with good bowel sounds.  No hepatosplenomegaly. Musculoskeletal: No joint deformity or effusion.  Full range of motion noted. Neurological: No deficits noted on motor, sensory and deep tendon reflex exam. Skin: No petechial rash or dryness.  Appeared moist.      Lab Results: Lab Results  Component Value Date   WBC 5.9 04/17/2021   HGB 12.3 (L) 04/17/2021   HCT 37.6 (L) 04/17/2021   MCV 90.2 04/17/2021   PLT 211 04/17/2021     Chemistry       Component Value Date/Time   NA 142 04/17/2021 1441   NA 139 06/02/2020 1124   K 5.1 04/17/2021 1441   CL 106 04/17/2021 1441   CO2 27 04/17/2021 1441   BUN 22 04/17/2021 1441   BUN 17 06/02/2020 1124   CREATININE 1.61 (H) 04/17/2021 1441   CREATININE 1.35 (H) 03/22/2016 1127      Component Value Date/Time   CALCIUM 8.9 04/17/2021 1441   ALKPHOS 63 04/17/2021 1441   AST 36 04/17/2021 1441   ALT 64 (H) 04/17/2021 1441   BILITOT 1.0 04/17/2021 1441      Latest Reference Range & Units 06/02/20 11:24 01/12/21 15:04 04/17/21 14:41  Prostate Specific Ag, Serum 0.0 - 4.0 ng/mL 101.0 (H) 0.4 <0.1  (H): Data is abnormally high  Impression and Plan:   65 year old man with:   1.    Castration-sensitive locally advanced prostate cancer diagnosed in November 2021.     Risks and benefits of continuing Zytiga were discussed at this time.  Complications including hypertension, weight gain and fluid retention were reviewed.  His PSA is undetectable at this time and integration of therapy were discussed.  After discussion I recommended 2 years of therapy to coincide with his androgen deprivation.  At this time he is agreeable.    2.  Androgen deprivation therapy: This is to be continued indefinitely.  Complications including weight gain, hot flashes among others were reiterated.  This will be given under the care of [facility].  He will be receiving that every 6 months.   3.  Hypertension: His blood pressure is within normal range and will continue to monitor on Zytiga.  4.  Local therapy control: He is status post radiation therapy completed in June 2022.   5.  Follow-up: In 4 months for a follow-up visit.     30  minutes were spent on this encounter.  The time was dedicated to reviewing laboratory data, disease status update, treatment choices and addressing complication related to cancer and cancer therapy.       Zola Button, MD 11/18/202212:56 PM

## 2021-08-18 LAB — PROSTATE-SPECIFIC AG, SERUM (LABCORP): Prostate Specific Ag, Serum: <0.1 ng/mL (ref 0.0–4.0)

## 2021-08-20 ENCOUNTER — Telehealth: Payer: Self-pay | Admitting: *Deleted

## 2021-08-20 NOTE — Telephone Encounter (Signed)
Communicated lab results to patient.  No further questions.  Will follow up in 4 months

## 2021-08-20 NOTE — Telephone Encounter (Signed)
-----   Message from Wyatt Portela, MD sent at 08/20/2021  8:48 AM EST ----- Please let him know his PSA is still low

## 2021-08-28 NOTE — Progress Notes (Signed)
    SUBJECTIVE:   CHIEF COMPLAINT / HPI:   Meet PCP [name] is a 65 y.o. male who presents to the clinic today to meet new PCP. He has no concerns he would like to discuss. His PHQ-9 was slightly elevated today- he reports this is due to worrying about his son who "isn't doing well for himself". He is not interested in medications or therapy at this point- "it's not that bad". He feels well supported.   Health Maintenance Up to date on colonoscopy (recently had on 08/31/2021), a few polpys were resected and pathology is pending. Due for flu, pneumonia and shingles vaccine. Will obtain lipid panel as last one was in 2020.   PERTINENT  PMH / PSH:  Past Medical History:  Diagnosis Date   Alcohol abuse    still drink beer   CHF (congestive heart failure) (HCC)    Chronic combined systolic and diastolic heart failure, NYHA class 3 ([place]) 06/02/2012   Chronic kidney disease    stage 3 no nephrologist   Chronic systolic heart failure ([place]) 06/02/2012   Gout    none in years   History of radiation therapy    completed 03-22-2021   Hyperlipidemia    Hypertension    Need for prophylactic vaccination and inoculation against single disease 04/15/2018   NICM (nonischemic cardiomyopathy) ([place]) 09/04/2012   Prostate cancer ([place]) 07/2020   Psoriasis    Possible psoriasis in the past   Pulmonary nodule, right 11/03/2012    OBJECTIVE:   BP 113/69   Pulse 61   Ht 6' (1.829 m)   Wt 219 lb 6 oz (99.5 kg)   SpO2 100%   BMI 29.75 kg/m    General: NAD, pleasant, able to participate in exam Cardiac: RRR, no murmurs. Respiratory: CTAB, normal effort, No wheezes, rales or rhonchi Extremities: no edema or cyanosis. Skin: warm and dry, no rashes noted Neuro: alert, no obvious focal deficits Psych: Normal affect and mood  Depression screen [facility] 2/9 09/03/2021 03/26/2021 06/02/2020  Decreased Interest 3 2 0  Down, Depressed, Hopeless 2 2 1   PHQ - 2 Score 5 4 1   Altered sleeping 2 0 0   Tired, decreased energy 3 1 1   Change in appetite 0 0 1  Feeling bad or failure about yourself  1 0 0  Trouble concentrating 0 0 0  Moving slowly or fidgety/restless 0 0 0  Suicidal thoughts 0 0 0  PHQ-9 Score 11 5 3   Difficult doing work/chores Somewhat difficult - Not difficult at all  Some recent data might be hidden     ASSESSMENT/PLAN:   Hypertension Well-controlled today. -Continue current medications (amlodipine 10 mg, carvedilol 3.125 mg, losartan 25 mg, spironolactone 25 mg)  Mixed hyperlipidemia Currently on atorvastatin 40 mg daily.  Reports good compliance. -Repeat lipid panel today  Healthcare maintenance Received flu vaccine today.  Shingles vaccine sent to pharmacy.  He is up-to-date on his colonoscopy.  Depressed mood PHQ-9 was increased today at 11.  Patient reports this is due to worry about his son.  He has no thoughts of SI.  Provided him with resources for therapy or counseling should he decide to pursue that.  Recommended that he return if symptoms worsen and he is interested in medication management for this (declined today).     Sharion Settler, [place]

## 2021-08-31 ENCOUNTER — Other Ambulatory Visit: Payer: Self-pay

## 2021-08-31 ENCOUNTER — Ambulatory Visit (AMBULATORY_SURGERY_CENTER): Payer: Medicare Other | Admitting: Internal Medicine

## 2021-08-31 ENCOUNTER — Encounter: Payer: Self-pay | Admitting: Internal Medicine

## 2021-08-31 VITALS — BP 141/78 | HR 75 | Temp 95.7°F | Resp 14 | Ht 72.0 in | Wt 219.0 lb

## 2021-08-31 DIAGNOSIS — D125 Benign neoplasm of sigmoid colon: Secondary | ICD-10-CM | POA: Diagnosis not present

## 2021-08-31 DIAGNOSIS — D123 Benign neoplasm of transverse colon: Secondary | ICD-10-CM | POA: Diagnosis not present

## 2021-08-31 DIAGNOSIS — D124 Benign neoplasm of descending colon: Secondary | ICD-10-CM

## 2021-08-31 DIAGNOSIS — Z86010 Personal history of colonic polyps: Secondary | ICD-10-CM | POA: Diagnosis not present

## 2021-08-31 MED ORDER — SODIUM CHLORIDE 0.9 % IV SOLN: 500.0000 mL | Freq: Once | INTRAVENOUS | Status: AC

## 2021-08-31 NOTE — Progress Notes (Signed)
Called to room to assist during endoscopic procedure.  Patient ID and intended procedure confirmed with present staff. Received instructions for my participation in the procedure from the performing physician.  

## 2021-08-31 NOTE — Op Note (Signed)
[facility] Patient Name: Kristy Catoe Procedure Date: 08/31/2021 9:44 AM MRN: [id number] Endoscopist: Jerene Bears , MD Age: 65 Referring MD:  Date of Birth: [birth year] Gender: Male Account #: [id number] Procedure:                Colonoscopy Indications:              High risk colon cancer surveillance: Personal                            history of adenomatous polyps including those                            greater than 10 mm in size, 1 with high-grade                            dysplasia (2018), Last colonoscopy: July 2019 Medicines:                Monitored Anesthesia Care Procedure:                Pre-Anesthesia Assessment:                           - Prior to the procedure, a History and Physical                            was performed, and patient medications and                            allergies were reviewed. The patient's tolerance of                            previous anesthesia was also reviewed. The risks                            and benefits of the procedure and the sedation                            options and risks were discussed with the patient.                            All questions were answered, and informed consent                            was obtained. Prior Anticoagulants: The patient has                            taken no previous anticoagulant or antiplatelet                            agents. ASA Grade Assessment: III - A patient with                            severe systemic disease. After reviewing the risks  and benefits, the patient was deemed in                            satisfactory condition to undergo the procedure.                           After obtaining informed consent, the colonoscope                            was passed under direct vision. Throughout the                            procedure, the patient's blood pressure, pulse, and                            oxygen saturations were  monitored continuously. The                            CF [id number] #[id number] was introduced through the anus                            and advanced to the cecum, identified by                            appendiceal orifice and ileocecal valve. The                            colonoscopy was performed without difficulty. The                            patient tolerated the procedure well. The quality                            of the bowel preparation was good. The ileocecal                            valve, appendiceal orifice, and rectum were                            photographed. Scope In: 9:53:05 AM Scope Out: 10:07:02 AM Scope Withdrawal Time: 0 hours 10 minutes 46 seconds  Total Procedure Duration: 0 hours 13 minutes 57 seconds  Findings:                 The digital rectal exam was normal.                           Three sessile polyps were found in the sigmoid                            colon, descending colon and transverse colon. The                            polyps were 3 to 5 mm in size. These polyps were  removed with a cold snare. Resection and retrieval                            were complete.                           There was a small lipoma, in the ascending colon.                           The exam was otherwise without abnormality on                            direct and retroflexion views. Complications:            No immediate complications. Estimated Blood Loss:     Estimated blood loss: none. Impression:               - Three 3 to 5 mm polyps in the sigmoid colon, in                            the descending colon and in the transverse colon,                            removed with a cold snare. Resected and retrieved.                           - Small lipoma in the ascending colon.                           - The examination was otherwise normal on direct                            and retroflexion views. Recommendation:           -  Patient has a contact number available for                            emergencies. The signs and symptoms of potential                            delayed complications were discussed with the                            patient. Return to normal activities tomorrow.                            Written discharge instructions were provided to the                            patient.                           - Resume previous diet.                           - Continue present medications.                           -  Await pathology results.                           - Repeat colonoscopy is recommended for                            surveillance. The colonoscopy date will be                            determined after pathology results from today's                            exam become available for review. Jerene Bears, MD 08/31/2021 10:11:10 AM This report has been signed electronically.

## 2021-08-31 NOTE — Progress Notes (Signed)
Pt's states no medical or surgical changes since previsit or office visit. VS assessed by D.T 

## 2021-08-31 NOTE — Patient Instructions (Signed)
YOU HAD AN ENDOSCOPIC PROCEDURE TODAY AT THE  ENDOSCOPY CENTER:   Refer to the procedure report that was given to you for any specific questions about what was found during the examination.  If the procedure report does not answer your questions, please call your gastroenterologist to clarify.  If you requested that your care partner not be given the details of your procedure findings, then the procedure report has been included in a sealed envelope for you to review at your convenience later.  YOU SHOULD EXPECT: Some feelings of bloating in the abdomen. Passage of more gas than usual.  Walking can help get rid of the air that was put into your GI tract during the procedure and reduce the bloating. If you had a lower endoscopy (such as a colonoscopy or flexible sigmoidoscopy) you may notice spotting of blood in your stool or on the toilet paper. If you underwent a bowel prep for your procedure, you may not have a normal bowel movement for a few days.  Please Note:  You might notice some irritation and congestion in your nose or some drainage.  This is from the oxygen used during your procedure.  There is no need for concern and it should clear up in a day or so.  SYMPTOMS TO REPORT IMMEDIATELY:  Following lower endoscopy (colonoscopy or flexible sigmoidoscopy):  Excessive amounts of blood in the stool  Significant tenderness or worsening of abdominal pains  Swelling of the abdomen that is new, acute  Fever of 100F or higher   For urgent or emergent issues, a gastroenterologist can be reached at any hour by calling [phone]. Do not use MyChart messaging for urgent concerns.    DIET:  We do recommend a small meal at first, but then you may proceed to your regular diet.  Drink plenty of fluids but you should avoid alcoholic beverages for 24 hours.  MEDICATIONS:  Continue present medications.  Please see handouts given to you by your recovery nurse.  Thank you for allowing us to  provide for your healthcare needs today.  ACTIVITY:  You should plan to take it easy for the rest of today and you should NOT DRIVE or use heavy machinery until tomorrow (because of the sedation medicines used during the test).    FOLLOW UP: Our staff will call the number listed on your records 48-72 hours following your procedure to check on you and address any questions or concerns that you may have regarding the information given to you following your procedure. If we do not reach you, we will leave a message.  We will attempt to reach you two times.  During this call, we will ask if you have developed any symptoms of COVID 19. If you develop any symptoms (ie: fever, flu-like symptoms, shortness of breath, cough etc.) before then, please call [phone].  If you test positive for Covid 19 in the 2 weeks post procedure, please call and report this information to us.    If any biopsies were taken you will be contacted by phone or by letter within the next 1-3 weeks.  Please call us at [phone] if you have not heard about the biopsies in 3 weeks.    SIGNATURES/CONFIDENTIALITY: You and/or your care partner have signed paperwork which will be entered into your electronic medical record.  These signatures attest to the fact that that the information above on your After Visit Summary has been reviewed and is understood.  Full responsibility of the   confidentiality of this discharge information lies with you and/or your care-partner.  

## 2021-08-31 NOTE — Progress Notes (Signed)
GASTROENTEROLOGY PROCEDURE H&P NOTE   Primary Care Physician: Sharion Settler, DO    Reason for Procedure:  History of colon polyps  Plan:    Colonoscopy  Patient is appropriate for endoscopic procedure(s) in the ambulatory ([facility]) setting.  The nature of the procedure, as well as the risks, benefits, and alternatives were carefully and thoroughly reviewed with the patient. Ample time for discussion and questions allowed. The patient understood, was satisfied, and agreed to proceed.     HPI: [name] is a 65 y.o. male who presents for surveillance colonoscopy.  Medical history as below.  Tolerated the prep.  No recent chest pain or shortness of breath.  No abdominal pain today.  Past Medical History:  Diagnosis Date   Alcohol abuse    still drink beer   CHF (congestive heart failure) (HCC)    Chronic combined systolic and diastolic heart failure, NYHA class 3 ([facility]) 06/02/2012   Chronic kidney disease    stage 3 no nephrologist   Chronic systolic heart failure ([facility]) 06/02/2012   Gout    none in years   History of radiation therapy    completed 03-22-2021   Hyperlipidemia    Hypertension    Need for prophylactic vaccination and inoculation against single disease 04/15/2018   NICM (nonischemic cardiomyopathy) ([facility]) 09/04/2012   Prostate cancer ([facility]) 07/2020   Psoriasis    Possible psoriasis in the past   Pulmonary nodule, right 11/03/2012    Past Surgical History:  Procedure Laterality Date   COLONOSCOPY     CYSTOSCOPY  01/23/2021   Procedure: CYSTOSCOPY FLEXIBLE;  Surgeon: Remi Haggard, MD;  [facility];  Service: Urology;;  no seeds found in bladder   KNEE SURGERY Left 1970's   arthroscopic knee out of joint   LEFT AND RIGHT HEART CATHETERIZATION WITH CORONARY ANGIOGRAM N/A 05/25/2012   Procedure: LEFT AND [place];  Surgeon: Jolaine Artist, MD;  Location: [facility] CATH LAB;  Service:  Cardiovascular;  Laterality: N/A;   POLYPECTOMY     RADIOACTIVE SEED IMPLANT N/A 01/23/2021   Procedure: RADIOACTIVE SEED IMPLANT/BRACHYTHERAPY IMPLANT;  Surgeon: Remi Haggard, MD;  [facility];  Service: Urology;  Laterality: N/A;   57  seeds implanted   SKIN GRAFT     L arm got caught in machine in [place] N/A 01/23/2021   Procedure: [place];  Surgeon: Remi Haggard, MD;  Location: [facility];  Service: Urology;  Laterality: N/A;    Prior to Admission medications   Medication Sig Start Date End Date Taking? Authorizing Provider  abiraterone acetate (ZYTIGA) 250 MG tablet Take 4 tablets (1,000 mg total) by mouth daily. Take on an empty stomach 1 hour before or 2 hours after a meal 08/16/21  Yes Shadad, Mathis Dad, MD  amLODipine (NORVASC) 10 MG tablet TAKE 1 TABLET (10 MG TOTAL) BY MOUTH DAILY. 03/09/21 03/09/22 Yes Richarda Osmond, MD  atorvastatin (LIPITOR) 40 MG tablet Take 1 tablet (40 mg total) by mouth daily. 03/07/21  Yes Richarda Osmond, MD  carvedilol (COREG) 3.125 MG tablet Take 1 tablet by mouth 2 (two) times daily with a meal. 04/09/21 04/09/22 Yes Espinoza, Alejandra, DO  losartan (COZAAR) 25 MG tablet Take 1 tablet (25 mg total) by mouth at bedtime. 06/11/21  Yes Espinoza, Alejandra, DO  predniSONE (DELTASONE) 5 MG tablet TAKE 1 TABLET BY MOUTH ONCE A DAY WITH BREAKFAST  11/23/20 11/23/21 Yes Wyatt Portela, MD  selenium sulfide (SELSUN) 2.5 % shampoo Apply 1 application topically daily as needed for irritation 07/31/21  Yes Sharion Settler, DO  spironolactone (ALDACTONE) 25 MG tablet Take 0.5 tablets (12.5 mg total) by mouth at bedtime. 07/31/21  Yes Sharion Settler, DO  tamsulosin (FLOMAX) 0.4 MG CAPS capsule Take 1 capsule by mouth every day 01/24/21  Yes   triamcinolone cream (KENALOG) 0.5 % APPLY TO THE AFFECTED AREAS DAILY AS NEEDED. 01/11/21  Yes Richarda Osmond, MD  nitroGLYCERIN  (NITROSTAT) 0.4 MG SL tablet PLACE 1 TABLET UNDER THE TONGUE EVERY 5 MINUTES AS NEEDED FOR CHEST PAIN. Patient not taking: Reported on 05/08/2021 04/26/16   Tonette Bihari, MD  traMADol (ULTRAM) 50 MG tablet Take 1 tablet (50 mg total) by mouth every 6 (six) hours as needed. Patient not taking: Reported on 08/31/2021 01/23/21   Remi Haggard, MD    Current Outpatient Medications  Medication Sig Dispense Refill   abiraterone acetate (ZYTIGA) 250 MG tablet Take 4 tablets (1,000 mg total) by mouth daily. Take on an empty stomach 1 hour before or 2 hours after a meal 120 tablet 0   amLODipine (NORVASC) 10 MG tablet TAKE 1 TABLET (10 MG TOTAL) BY MOUTH DAILY. 90 tablet 1   atorvastatin (LIPITOR) 40 MG tablet Take 1 tablet (40 mg total) by mouth daily. 90 tablet 3   carvedilol (COREG) 3.125 MG tablet Take 1 tablet by mouth 2 (two) times daily with a meal. 60 tablet 11   losartan (COZAAR) 25 MG tablet Take 1 tablet (25 mg total) by mouth at bedtime. 90 tablet 0   predniSONE (DELTASONE) 5 MG tablet TAKE 1 TABLET BY MOUTH ONCE A DAY WITH BREAKFAST 90 tablet 3   selenium sulfide (SELSUN) 2.5 % shampoo Apply 1 application topically daily as needed for irritation 120 mL 12   spironolactone (ALDACTONE) 25 MG tablet Take 0.5 tablets (12.5 mg total) by mouth at bedtime. 90 tablet 0   tamsulosin (FLOMAX) 0.4 MG CAPS capsule Take 1 capsule by mouth every day 30 capsule 11   triamcinolone cream (KENALOG) 0.5 % APPLY TO THE AFFECTED AREAS DAILY AS NEEDED. 30 g 3   nitroGLYCERIN (NITROSTAT) 0.4 MG SL tablet PLACE 1 TABLET UNDER THE TONGUE EVERY 5 MINUTES AS NEEDED FOR CHEST PAIN. (Patient not taking: Reported on 05/08/2021) 10 tablet 0   traMADol (ULTRAM) 50 MG tablet Take 1 tablet (50 mg total) by mouth every 6 (six) hours as needed. (Patient not taking: Reported on 08/31/2021) 20 tablet 0   Current Facility-Administered Medications  Medication Dose Route Frequency Provider Last Rate Last Admin   0.9 %  sodium  chloride infusion  500 mL Intravenous Once Alanis Clift, Lajuan Lines, MD        Allergies as of 08/31/2021   (No Known Allergies)    Family History  Problem Relation Age of Onset   Heart failure Father    Heart failure Other        Uncle. Possible MI?   Cancer Sister        Skin   Cancer Brother    Prostate cancer Brother    Colon polyps Brother    Colon polyps Brother    Prostate cancer Brother    Breast cancer Daughter    Colon cancer Neg Hx    Esophageal cancer Neg Hx    Rectal cancer Neg Hx    Stomach cancer Neg Hx     Social  History   Socioeconomic History   Marital status: Legally Separated    Spouse name: Not on file   Number of children: 7   Years of education: 27   Highest education level: 12th grade  Occupational History    Comment: Retired. Formerly disabled due to heart failure.  Tobacco Use   Smoking status: Former    Types: Cigars    Quit date: 01/01/2012    Years since quitting: 9.6   Smokeless tobacco: Never  Vaping Use   Vaping Use: Never used  Substance and Sexual Activity   Alcohol use: Yes    Alcohol/week: 0.0 standard drinks    Comment: occ beer, last 01/22/21   Drug use: Yes    Types: Marijuana, "Crack" cocaine    Comment:  crack cocaine last used 25 yrs ago, marijuana last used 5 months ago per pt (01/23/21)   Sexual activity: Yes  Other Topics Concern   Not on file  Social History Narrative   Retired/disabled from trucking.   Lives in home but one son and girlfriend with 4 granddaughters live with him "temporarily".      Mobile home with some steps, has handrails. Non-working smoke alarms.    Likes to eat meat, pasta, vegetables, and fruit. Sweet tea is what he likes to drink, V8 juice.      Loves to sing, sudoku puzzles, facebook.      Wears seat belt in car.   Social Determinants of Health   Financial Resource Strain: Not on file  Food Insecurity: Not on file  Transportation Needs: Not on file  Physical Activity: Not on file  Stress:  Not on file  Social Connections: Not on file  Intimate Partner Violence: Not on file    Physical Exam: Vital signs in last 24 hours: @BP  117/67   Pulse 68   Temp (!) 95.7 F (35.4 C) (Skin)   Ht 6' (1.829 m)   Wt 219 lb (99.3 kg)   SpO2 97%   BMI 29.70 kg/m  GEN: NAD EYE: Sclerae anicteric ENT: MMM CV: Non-tachycardic Pulm: CTA b/l GI: Soft, NT/ND NEURO:  Alert & Oriented x 3   Zenovia Jarred, MD [facility]  08/31/2021 9:42 AM

## 2021-08-31 NOTE — Progress Notes (Signed)
VSS, transported to PCU

## 2021-09-03 ENCOUNTER — Encounter: Payer: Self-pay | Admitting: Family Medicine

## 2021-09-03 ENCOUNTER — Other Ambulatory Visit: Payer: Self-pay

## 2021-09-03 ENCOUNTER — Other Ambulatory Visit: Payer: Self-pay | Admitting: Student in an Organized Health Care Education/Training Program

## 2021-09-03 ENCOUNTER — Other Ambulatory Visit (HOSPITAL_COMMUNITY): Payer: Self-pay

## 2021-09-03 ENCOUNTER — Ambulatory Visit (INDEPENDENT_AMBULATORY_CARE_PROVIDER_SITE_OTHER): Payer: Medicare Other | Admitting: Family Medicine

## 2021-09-03 ENCOUNTER — Ambulatory Visit (INDEPENDENT_AMBULATORY_CARE_PROVIDER_SITE_OTHER): Payer: Medicare Other

## 2021-09-03 VITALS — BP 113/69 | HR 61 | Ht 72.0 in | Wt 219.4 lb

## 2021-09-03 DIAGNOSIS — Z23 Encounter for immunization: Secondary | ICD-10-CM | POA: Diagnosis not present

## 2021-09-03 DIAGNOSIS — E782 Mixed hyperlipidemia: Secondary | ICD-10-CM | POA: Diagnosis not present

## 2021-09-03 DIAGNOSIS — I10 Essential (primary) hypertension: Secondary | ICD-10-CM | POA: Diagnosis not present

## 2021-09-03 DIAGNOSIS — Z0000 Encounter for general adult medical examination without abnormal findings: Secondary | ICD-10-CM

## 2021-09-03 DIAGNOSIS — R4589 Other symptoms and signs involving emotional state: Secondary | ICD-10-CM | POA: Diagnosis not present

## 2021-09-03 MED ORDER — ZOSTER VAC RECOMB ADJUVANTED 50 MCG/0.5ML IM SUSR
0.5000 mL | Freq: Once | INTRAMUSCULAR | 0 refills | Status: AC
  Filled 2021-09-03: qty 0.5, 1d supply, fill #0

## 2021-09-03 MED FILL — Amlodipine Besylate Tab 10 MG (Base Equivalent): ORAL | 90 days supply | Qty: 90 | Fill #0 | Status: AC

## 2021-09-03 MED FILL — Prednisone Tab 5 MG: ORAL | 90 days supply | Qty: 90 | Fill #1 | Status: AC

## 2021-09-03 NOTE — Patient Instructions (Addendum)
It was wonderful to meet you today!  Please bring ALL of your medications with you to every visit.   Today we talked about:  We are doing lab work today to check your cholesterol. I will send you a MyChart message if you have MyChart. Otherwise, I will give you a call for abnormal results or send a letter if everything returned back normal. If you don't hear from me in 2 weeks, please call the office.    Ive sent a prescription for the Shingles vaccine to the [facility].   Thank you for choosing [facility].   Please call [phone] with any questions about today's appointment.  Please be sure to schedule follow up at the front  desk before you leave today.   Sharion Settler, DO PGY-2 Family Medicine     Therapy and Counseling Resources Most providers on this list will take Medicaid. Patients with commercial insurance or Medicare should contact their insurance company to get a list of in network providers.  [facility]:Psychiatry and Counseling [address] [facility] [address] [place], [place] [address] [place], [place], [place] [address]      [place] [address]  [place], [place] [address] [phone]  [facility] [address] [place]  [place], [place] [address]       [phone]     [facility] (virtual only) [phone]  Jinny Bl[facility] [address] [place], [place]  [facility] Solutions:  [place]. [place] [phone]  [facility]:  [address] [place] [phone]  [facility] (under & uninsured) [address], [place] [phone]    [email or web address]    [place] [address]  [place]    [phone]  [facility] [place]     Phone:  [phone]     [place]  [address]      [place], [place] ext [place]: [place], [place], [place]   [place] ([place] therapist) [email or web address]  [place] [address]   [place] [address]    [phone]    [facility]   [address],  [place], [place]   [place]  [place]  [facility]  [address] [place], [place]        [phone]  Open Access/Walk In Clinic under & uninsured  [facility]  [address] [place], [place] Crisis [phone]  Family Service of the [place],  ([place])   [place], [place]: [phone]) 8:30 - 12; 1 - 2:30  Family Service of the [place],  [place], [place]    ([phone]):8:30 - 12; 2 - 3PM  [facility] [employer],  [address]  [place]; [phone]):   Mon - Fri 8 AM - 5 PM  [facility] [place]  MWF 12:30 to 3:00 or call to schedule an appointment  [phone]  Specific Provider options Psychology Today  [email or web address] click on find a therapist  enter your zip code left side and select or tailor a therapist for your specific need.   [facility] Provider Directory [email or web address]  (Medicaid)   Follow all drop down to find a provider  [place] or [email or web address] [address], [place], [place] Recovery support and educational   24- Hour Availability:   [facility]  [address] [place], [place] Crisis [phone]  Family Service of the [employer] [phone]  [place]  [phone]   [place]  [phone] (after hours)  Therapeutic Alternative/Mobile Crisis   [phone]  [place] National Suicide Hotline  857-686-3494 Diamantina Monks)  Call 911 or go to emergency room  [facility]  [phone]);  [place] and [employer]  [phone]); [place], [place], [place], [place], [place], [place], [place]

## 2021-09-03 NOTE — Assessment & Plan Note (Signed)
Well-controlled today. -Continue current medications (amlodipine 10 mg, carvedilol 3.125 mg, losartan 25 mg, spironolactone 25 mg)

## 2021-09-03 NOTE — Assessment & Plan Note (Signed)
PHQ-9 was increased today at 11.  Patient reports this is due to worry about his son.  He has no thoughts of SI.  Provided him with resources for therapy or counseling should he decide to pursue that.  Recommended that he return if symptoms worsen and he is interested in medication management for this (declined today).

## 2021-09-03 NOTE — Assessment & Plan Note (Signed)
Currently on atorvastatin 40 mg daily.  Reports good compliance. -Repeat lipid panel today

## 2021-09-03 NOTE — Assessment & Plan Note (Signed)
Received flu vaccine today.  Shingles vaccine sent to pharmacy.  He is up-to-date on his colonoscopy.

## 2021-09-04 ENCOUNTER — Telehealth: Payer: Self-pay

## 2021-09-04 ENCOUNTER — Encounter: Payer: Self-pay | Admitting: Family Medicine

## 2021-09-04 ENCOUNTER — Telehealth: Payer: Self-pay | Admitting: *Deleted

## 2021-09-04 ENCOUNTER — Encounter: Payer: Self-pay | Admitting: Internal Medicine

## 2021-09-04 LAB — LIPID PANEL
Chol/HDL Ratio: 2.2 ratio (ref 0.0–5.0)
Cholesterol, Total: 123 mg/dL (ref 100–199)
HDL: 57 mg/dL (ref >39)
LDL Chol Calc (NIH): 52 mg/dL (ref 0–99)
Triglycerides: 65 mg/dL (ref 0–149)
VLDL Cholesterol Cal: 14 mg/dL (ref 5–40)

## 2021-09-04 NOTE — Telephone Encounter (Signed)
  Follow up Call-  Call back number 08/31/2021  Post procedure Call Back phone  # [phone]  Permission to leave phone message Yes  Some recent data might be hidden     Patient questions:  Do you have a fever, pain , or abdominal swelling? No. Pain Score  0 *  Have you tolerated food without any problems? Yes.    Have you been able to return to your normal activities? Yes.    Do you have any questions about your discharge instructions: Diet   No. Medications  No. Follow up visit  No.  Do you have questions or concerns about your Care? No.  Actions: * If pain score is 4 or above: No action needed, pain <4.  Have you developed a fever since your procedure? no  2.   Have you had an respiratory symptoms (SOB or cough) since your procedure? no  3.   Have you tested positive for COVID 19 since your procedure no  4.   Have you had any family members/close contacts diagnosed with the COVID 19 since your procedure?  no   If yes to any of these questions please route to Joylene John, RN and Joella Prince, RN

## 2021-09-04 NOTE — Telephone Encounter (Signed)
  Follow up Call-  Call back number 08/31/2021  Post procedure Call Back phone  # [phone]  Permission to leave phone message Yes  Some recent data might be hidden   F/u call attempted, no answer, left msg

## 2021-09-05 ENCOUNTER — Other Ambulatory Visit (HOSPITAL_COMMUNITY): Payer: Self-pay

## 2021-09-13 ENCOUNTER — Telehealth: Payer: Self-pay | Admitting: *Deleted

## 2021-09-14 ENCOUNTER — Other Ambulatory Visit: Payer: Self-pay | Admitting: *Deleted

## 2021-09-14 DIAGNOSIS — C61 Malignant neoplasm of prostate: Secondary | ICD-10-CM

## 2021-09-14 MED ORDER — ABIRATERONE ACETATE 250 MG PO TABS
1000.0000 mg | ORAL_TABLET | Freq: Every day | ORAL | 0 refills | Status: DC
  Filled 2021-09-14 – 2021-10-30 (×2): qty 120, 30d supply, fill #0

## 2021-09-17 ENCOUNTER — Other Ambulatory Visit (HOSPITAL_COMMUNITY): Payer: Self-pay

## 2021-09-17 ENCOUNTER — Telehealth: Payer: Self-pay

## 2021-09-17 NOTE — Telephone Encounter (Signed)
Oral Chemotherapy Pharmacist Encounter  Informed patient that we will be filling his Zytiga at [facility] and that we have grants to cover patients out of pocket costs. Patient states he has ~45 days of medication left.  Will have refill set up for the middle of January (week of January 16th).   Drema Halon, PharmD Hematology/Oncology Clinical Pharmacist [facility] [phone]

## 2021-09-18 ENCOUNTER — Encounter: Payer: Self-pay | Admitting: *Deleted

## 2021-09-18 ENCOUNTER — Other Ambulatory Visit: Payer: Self-pay

## 2021-09-18 ENCOUNTER — Inpatient Hospital Stay: Payer: Medicare Other | Attending: Oncology | Admitting: *Deleted

## 2021-09-18 DIAGNOSIS — C61 Malignant neoplasm of prostate: Secondary | ICD-10-CM

## 2021-09-18 NOTE — Progress Notes (Signed)
2 Identifiers used for verification purposes only. SCP reviewed and completed. SDOH assessed and completed. Pt denies pain but still has some fatigue issues. Nocturia still present 3-4 times a night. Pt denies urgency and hesitancy. He states urine flow is back to normal. Bowels are normal.  He recently had colonoscopy on 08/31/21. Also visited PCP recently on 09/03/21. He is up to date on vaccines. Pt does not exercise regularly.He is on ADT, hot flashes are getting better. Pt did inform me he has been feeling a little down recently lost his daughter to cancer. I will let our Chaplain aware.

## 2021-09-28 ENCOUNTER — Telehealth: Payer: Self-pay

## 2021-09-28 ENCOUNTER — Other Ambulatory Visit (HOSPITAL_COMMUNITY): Payer: Self-pay

## 2021-09-28 MED ORDER — COLCHICINE 0.6 MG PO TABS
ORAL_TABLET | ORAL | 0 refills | Status: DC
  Filled 2021-09-28: qty 7, 4d supply, fill #0

## 2021-09-28 MED ORDER — PREDNISONE 20 MG PO TABS
40.0000 mg | ORAL_TABLET | Freq: Every day | ORAL | 0 refills | Status: DC
Start: 2021-09-28 — End: 2021-10-24
  Filled 2021-09-28: qty 10, 5d supply, fill #0

## 2021-09-28 NOTE — Telephone Encounter (Signed)
Called patient and informed of below.   Patient appreciative.   Talbot Grumbling, RN

## 2021-09-28 NOTE — Telephone Encounter (Signed)
Patient calls nurse line requesting medication refill on gout medication. Patient has follow up appointment on 1/3, however, is in a lot of pain and is requesting refill for over the weekend. Patient previously prescribed colchicine 0.6 mg tablets.   Please advise.   Talbot Grumbling, RN

## 2021-09-28 NOTE — Telephone Encounter (Signed)
Patient returns call to nurse line. Reports that pain is similar to pain in the past. Pain is in the left foot.   Reports that he is not having fever or chills.   Talbot Grumbling, RN

## 2021-09-28 NOTE — Telephone Encounter (Signed)
I have sent in a prescription for colchicine to last him until his upcoming appointment.  On day 1, take 1.2 mg (2 tablets), followed by 0.6 mg after 1 hour.  On day 2 and thereafter, take 0.6 mg (1 tablet) once a day.

## 2021-09-28 NOTE — Telephone Encounter (Signed)
Attempted to call patient but no answer. If he calls back, please ask where he is having pain and if it feels similar to a gout flare in the past. He needs more urgent evaluation if he is having any fever or chills.

## 2021-09-28 NOTE — Telephone Encounter (Signed)
Pharmacist reached out to me recommending switch to prednisone due to drug interactions, he will be given a 5-day course of prednisone instead of colchicine.

## 2021-10-01 ENCOUNTER — Other Ambulatory Visit: Payer: Self-pay | Admitting: Family Medicine

## 2021-10-01 ENCOUNTER — Other Ambulatory Visit (HOSPITAL_COMMUNITY): Payer: Self-pay

## 2021-10-01 DIAGNOSIS — I5022 Chronic systolic (congestive) heart failure: Secondary | ICD-10-CM

## 2021-10-02 ENCOUNTER — Ambulatory Visit (INDEPENDENT_AMBULATORY_CARE_PROVIDER_SITE_OTHER): Payer: Medicare Other | Admitting: Family Medicine

## 2021-10-02 ENCOUNTER — Encounter: Payer: Self-pay | Admitting: Family Medicine

## 2021-10-02 ENCOUNTER — Other Ambulatory Visit: Payer: Self-pay

## 2021-10-02 ENCOUNTER — Other Ambulatory Visit (HOSPITAL_COMMUNITY): Payer: Self-pay

## 2021-10-02 DIAGNOSIS — M109 Gout, unspecified: Secondary | ICD-10-CM | POA: Diagnosis not present

## 2021-10-02 DIAGNOSIS — M25561 Pain in right knee: Secondary | ICD-10-CM | POA: Diagnosis not present

## 2021-10-02 MED ORDER — LOSARTAN POTASSIUM 25 MG PO TABS
25.0000 mg | ORAL_TABLET | Freq: Every day | ORAL | 0 refills | Status: DC
  Filled 2021-10-02: qty 90, 90d supply, fill #0

## 2021-10-02 NOTE — Progress Notes (Signed)
° ° °  SUBJECTIVE:   CHIEF COMPLAINT / HPI:   L Knee Pain Has had intermittent pain in his L knee and foot for years. A week ago started with pain and swelling in L knee and then in L foot.  Was called in prednisone and both are markedly better.  No fevers or chills.  Apparently could not use colchine due to interactions with his current medications.  Could not find a uric acid in his labs   Last documented gout flare 2019   PERTINENT  PMH / PSH: Hypertension CHF CKD Gout  Lab Results  Component Value Date   NA 139 08/17/2021   K 4.5 08/17/2021   CO2 25 08/17/2021   GLUCOSE 113 (H) 08/17/2021   BUN 23 08/17/2021   CREATININE 1.22 08/17/2021   CALCIUM 9.0 08/17/2021   GFRNONAA >60 08/17/2021   No results found for: LABURIC   OBJECTIVE:   BP 130/77    Pulse 61    Wt 215 lb (97.5 kg)    SpO2 96%    BMI 29.16 kg/m   L knee has large effusion with mild decrease in range of motion.  Mildly tender with active movement and walks with a limp and cane.  Passive range of motion is not tender. No warmth  L foot is nontender.  No other involved joints.  No rash   Procedure I assisted Dr Jinny Sanders.   Using medial approach with flexed knee after betadiene and alcohol prep and cold spray 22 ga needle inserted with immedate return of clear yellow fluid.  10 cc removed.  Patient tolerated well   ASSESSMENT/PLAN:   Right knee pain Unsure if this is gout vs DJD flare.  No signs of infection.  Will send synovial fluid for cell count and crystal.  No further treatment now since improving.  Will also check Uric acid.  If positive for gout will need to investigate possible prophylaxis      Lind Covert, MD [place]

## 2021-10-02 NOTE — Patient Instructions (Addendum)
Good to see you today - Thank you for coming in  Things we discussed today:  Knee and Foot Pain - we will check a lab test to see if or how bad your gout might be and what we can do to prevent it - I will let you know if we need to do anything with the fluid - Call us if getting worse   Please always bring your medication bottles  Come back to see Dr Nita Sells in 2-4 weeks

## 2021-10-02 NOTE — Assessment & Plan Note (Addendum)
Unsure if this is gout vs DJD flare.  No signs of infection.  Will send synovial fluid for cell count and crystal.  No further treatment now since improving.  Will also check Uric acid.  If positive for gout will need to investigate possible prophylaxis given his currents medications.  Asked him to bring these his next visit

## 2021-10-03 LAB — URIC ACID: Uric Acid: 3.9 mg/dL (ref 3.8–8.4)

## 2021-10-04 LAB — SYNOVIAL FLUID, CELL COUNT
Eos, Fluid: 0 %
Lining Cells, Synovial: 1 %
Lymphs, Fluid: 16 %
Macrophages Fld: 28 %
Nuc cell # Fld: 931 cells/uL — ABNORMAL HIGH (ref 0–200)
Polys, Fluid: 55 %

## 2021-10-05 ENCOUNTER — Encounter: Payer: Self-pay | Admitting: General Practice

## 2021-10-05 NOTE — Progress Notes (Signed)
[facility] Spiritual Care Note  Referred by Sandria Senter for bereavement support due to the loss of his daughter to cancer. Reached Mr Alvillar by phone, offering condolences and introducing Spiritual Care as part of his support team. He reports good support from his other six children, who call him almost every day, which is helping significantly; at this time, that feels like enough to him. Ensured that he has my direct-dial number in case need/desire to connect arises later on and that he is aware of free grief counseling and support groups available through hospice.   [place], [place], [facility] Pager [phone] Voicemail [phone]

## 2021-10-09 ENCOUNTER — Ambulatory Visit (INDEPENDENT_AMBULATORY_CARE_PROVIDER_SITE_OTHER): Payer: Medicare Other | Admitting: Family Medicine

## 2021-10-09 ENCOUNTER — Other Ambulatory Visit (HOSPITAL_COMMUNITY): Payer: Self-pay

## 2021-10-09 ENCOUNTER — Other Ambulatory Visit: Payer: Self-pay

## 2021-10-09 VITALS — BP 137/69 | HR 70 | Temp 99.2°F | Ht 72.0 in | Wt 215.0 lb

## 2021-10-09 DIAGNOSIS — R059 Cough, unspecified: Secondary | ICD-10-CM | POA: Insufficient documentation

## 2021-10-09 DIAGNOSIS — R051 Acute cough: Secondary | ICD-10-CM

## 2021-10-09 MED ORDER — BENZONATATE 100 MG PO CAPS
100.0000 mg | ORAL_CAPSULE | Freq: Two times a day (BID) | ORAL | 0 refills | Status: AC | PRN
  Filled 2021-10-09: qty 10, 5d supply, fill #0

## 2021-10-09 NOTE — Progress Notes (Signed)
° ° °  SUBJECTIVE:   CHIEF COMPLAINT / HPI:   Patient presents with cough for a week ago. Cough with occasional clear phlegm but usually dry cough. Also endorsing minimal rhinorrhea. Denies fever, chills, nausea, vomiting and headaches. Cough is its worst when he tries to lay down. Has tried OTC cough meds. No relieving factors. No known sick contacts. Sometimes eats snacks in bed before going to sleep. Denies any reflux symptoms, burning sensation or epigastric pain. Denies dyspnea. Having a lot of trouble sleeping due to the cough.   OBJECTIVE:   BP 137/69    Pulse 70    Temp 99.2 F (37.3 C) (Oral)    Ht 6' (1.829 m)    Wt 215 lb (97.5 kg)    SpO2 96%    BMI 29.16 kg/m   General: Patient well-appearing, in no acute distress. HEENT: presence of anterior cervical LAD noted, normal buccal mucosa, non-bulging TM bilaterally without drainage or erythema CV: RRR, no murmurs or gallops auscultated  Resp: CTAB, no wheezing, rales or rhonchi noted Ext: radial pulses strong and equal bilaterally, capillary refill less than 2 sec  ASSESSMENT/PLAN:   Cough -Cough likely secondary to viral illness, supportive care measures encouraged. Reflux and medication related etiology also considered but likely not the cause       Donney Dice, [place]

## 2021-10-09 NOTE — Patient Instructions (Addendum)
It was great seeing you today!  Today we discussed your cough, it seems that you may have had a viral illness that you are recovering from. Please make sure to drink plenty of fluids. Honey and tea can help soothe the throat. I have also prescribed a medication for cough that I will provide a very short-term supply of so that you are able to get some rest at night.   Please follow up at your next scheduled appointment in 2-4 weeks with PCP, if anything arises between now and then, please don't hesitate to contact our office.   Thank you for allowing [place] to be a part of your medical care!  Thank you, Dr. Larae Grooms

## 2021-10-09 NOTE — Assessment & Plan Note (Signed)
-  Cough likely secondary to viral illness, supportive care measures encouraged. Reflux and medication related etiology also considered but likely not the cause

## 2021-10-15 ENCOUNTER — Other Ambulatory Visit (HOSPITAL_COMMUNITY): Payer: Self-pay

## 2021-10-22 ENCOUNTER — Other Ambulatory Visit (HOSPITAL_COMMUNITY): Payer: Self-pay

## 2021-10-22 MED FILL — Spironolactone Tab 25 MG: ORAL | 90 days supply | Qty: 45 | Fill #1 | Status: AC

## 2021-10-22 MED FILL — Selenium Sulfide Lotion 2.5%: CUTANEOUS | 30 days supply | Qty: 120 | Fill #1 | Status: AC

## 2021-10-23 NOTE — Patient Instructions (Addendum)
It was wonderful to see you today!  Please bring ALL of your medications with you to every visit.   Today we talked about:  -I am glad that you are doing well! -No medication changes at the moment. -You received your pneumonia vaccine today.  Thank you for choosing [facility].   Please call [phone] with any questions about today's appointment.  Please be sure to schedule follow up at the front  desk before you leave today.   Sharion Settler, DO PGY-2 Family Medicine

## 2021-10-23 NOTE — Progress Notes (Signed)
° ° °  SUBJECTIVE:   CHIEF COMPLAINT / HPI:   Gout F/U  [name] is a 66 y.o. male with a PMHx of gout who presents for follow up today. Was previously seen in the [facility] clinic on 1/3 for left knee pain, thought to be due gout flare.  Apparently patient had a large effusion and decreased range of motion.  Providers were able to drain 10 cc and sent fluid for cell count and crystals.  Labs returned with 931 nucleated cells, rare RBC, monosodium urate crystals seen both intracellular and extracellular.  Uric acid returned at 3.9.   Patient reports that his knee is feeling improved.    Cough Last seen in our clinic on 1/10 for acute cough. States "it got really bad" for a while but now seems to be improved. No respiratory difficulty   PERTINENT  PMH / PSH:  Hypertension, CKD stage III, gout, nonischemic cardiomyopathy   OBJECTIVE:   BP 132/77    Pulse 66    Ht 6' (1.829 m)    Wt 214 lb (97.1 kg)    BMI 29.02 kg/m    General: NAD, pleasant, able to participate in exam Cardiac: RRR, no murmurs. Respiratory: CTAB, normal effort, No wheezes, rales or rhonchi Extremities: no edema or cyanosis.   Left Knee: without edema. Non-tender to palpation of joint. Able to flex and extend without difficulty. No erythema. Negative bulge test. Skin: vitiligo on hands, legs Psych: Normal affect and mood  ASSESSMENT/PLAN:   Gout Seems to be completely resolved after Prednisone course and knee aspiration. Exam unremarkable today. States he has 2-3 flares each year and that they typically self-subside. Hesitate to start him on prophylaxis given his uric acid level is already at goal <6.  -No changes at this time; can reconsider if flares worsening or increasing frequency  Cough Resolved, was likely viral.   Hypertension Adherent with medications. BP at goal today. No changes.  Pneumococcal vaccination given Received [id number] today without complication.     Sharion Settler, [place]

## 2021-10-24 ENCOUNTER — Encounter: Payer: Self-pay | Admitting: Family Medicine

## 2021-10-24 ENCOUNTER — Other Ambulatory Visit: Payer: Self-pay

## 2021-10-24 ENCOUNTER — Ambulatory Visit (INDEPENDENT_AMBULATORY_CARE_PROVIDER_SITE_OTHER): Payer: Medicare Other | Admitting: Family Medicine

## 2021-10-24 VITALS — BP 132/77 | HR 66 | Ht 72.0 in | Wt 214.0 lb

## 2021-10-24 DIAGNOSIS — R051 Acute cough: Secondary | ICD-10-CM

## 2021-10-24 DIAGNOSIS — Z23 Encounter for immunization: Secondary | ICD-10-CM | POA: Insufficient documentation

## 2021-10-24 DIAGNOSIS — M109 Gout, unspecified: Secondary | ICD-10-CM

## 2021-10-24 DIAGNOSIS — I10 Essential (primary) hypertension: Secondary | ICD-10-CM | POA: Diagnosis not present

## 2021-10-24 NOTE — Assessment & Plan Note (Signed)
Adherent with medications. BP at goal today. No changes.

## 2021-10-24 NOTE — Assessment & Plan Note (Addendum)
Received [id number] today without complication.

## 2021-10-24 NOTE — Assessment & Plan Note (Addendum)
Resolved, was likely viral.

## 2021-10-24 NOTE — Assessment & Plan Note (Signed)
Seems to be completely resolved after Prednisone course and knee aspiration. Exam unremarkable today. States he has 2-3 flares each year and that they typically self-subside. Hesitate to start him on prophylaxis given his uric acid level is already at goal <6.  -No changes at this time; can reconsider if flares worsening or increasing frequency

## 2021-10-29 ENCOUNTER — Other Ambulatory Visit (HOSPITAL_COMMUNITY): Payer: Self-pay

## 2021-10-30 ENCOUNTER — Other Ambulatory Visit (HOSPITAL_COMMUNITY): Payer: Self-pay

## 2021-11-15 ENCOUNTER — Other Ambulatory Visit: Payer: Self-pay | Admitting: Oncology

## 2021-11-15 ENCOUNTER — Other Ambulatory Visit (HOSPITAL_COMMUNITY): Payer: Self-pay

## 2021-11-15 MED ORDER — PREDNISONE 5 MG PO TABS
5.0000 mg | ORAL_TABLET | Freq: Every day | ORAL | 3 refills | Status: DC
Start: 2021-11-15 — End: 2023-07-21
  Filled 2021-11-15 – 2021-11-23 (×3): qty 90, 90d supply, fill #0
  Filled 2022-02-24: qty 90, 90d supply, fill #1
  Filled 2022-05-30: qty 90, 90d supply, fill #2
  Filled 2022-10-07: qty 90, 90d supply, fill #3

## 2021-11-20 ENCOUNTER — Other Ambulatory Visit (HOSPITAL_COMMUNITY): Payer: Self-pay

## 2021-11-21 ENCOUNTER — Other Ambulatory Visit (HOSPITAL_COMMUNITY): Payer: Self-pay

## 2021-11-22 ENCOUNTER — Other Ambulatory Visit (HOSPITAL_COMMUNITY): Payer: Self-pay

## 2021-11-22 MED FILL — Amlodipine Besylate Tab 10 MG (Base Equivalent): ORAL | 90 days supply | Qty: 90 | Fill #1 | Status: AC

## 2021-11-23 ENCOUNTER — Other Ambulatory Visit (HOSPITAL_COMMUNITY): Payer: Self-pay

## 2021-11-27 ENCOUNTER — Other Ambulatory Visit (HOSPITAL_COMMUNITY): Payer: Self-pay

## 2021-11-29 ENCOUNTER — Other Ambulatory Visit (HOSPITAL_COMMUNITY): Payer: Self-pay

## 2021-12-10 DIAGNOSIS — C61 Malignant neoplasm of prostate: Secondary | ICD-10-CM | POA: Diagnosis not present

## 2021-12-13 ENCOUNTER — Other Ambulatory Visit (HOSPITAL_COMMUNITY): Payer: Self-pay

## 2021-12-13 ENCOUNTER — Telehealth: Payer: Self-pay | Admitting: Oncology

## 2021-12-13 NOTE — Telephone Encounter (Signed)
Called patient regarding 03/17 appointment, patient is notified. ?

## 2021-12-14 ENCOUNTER — Inpatient Hospital Stay (HOSPITAL_BASED_OUTPATIENT_CLINIC_OR_DEPARTMENT_OTHER): Payer: Medicare Other | Admitting: Oncology

## 2021-12-14 ENCOUNTER — Inpatient Hospital Stay: Payer: Medicare Other | Attending: Oncology

## 2021-12-14 ENCOUNTER — Other Ambulatory Visit: Payer: Self-pay

## 2021-12-14 VITALS — BP 124/68 | HR 68 | Temp 98.1°F | Resp 17 | Ht 72.0 in | Wt 212.3 lb

## 2021-12-14 DIAGNOSIS — I10 Essential (primary) hypertension: Secondary | ICD-10-CM | POA: Insufficient documentation

## 2021-12-14 DIAGNOSIS — C61 Malignant neoplasm of prostate: Secondary | ICD-10-CM | POA: Diagnosis not present

## 2021-12-14 LAB — CMP (CANCER CENTER ONLY)
ALT: 13 U/L (ref 0–44)
AST: 16 U/L (ref 15–41)
Albumin: 4.2 g/dL (ref 3.5–5.0)
Alkaline Phosphatase: 63 U/L (ref 38–126)
Anion gap: 6 (ref 5–15)
BUN: 18 mg/dL (ref 8–23)
CO2: 29 mmol/L (ref 22–32)
Calcium: 9.4 mg/dL (ref 8.9–10.3)
Chloride: 104 mmol/L (ref 98–111)
Creatinine: 1.43 mg/dL — ABNORMAL HIGH (ref 0.61–1.24)
GFR, Estimated: 54 mL/min — ABNORMAL LOW (ref >60)
Glucose, Bld: 102 mg/dL — ABNORMAL HIGH (ref 70–99)
Potassium: 4.7 mmol/L (ref 3.5–5.1)
Sodium: 139 mmol/L (ref 135–145)
Total Bilirubin: 0.6 mg/dL (ref 0.3–1.2)
Total Protein: 7.8 g/dL (ref 6.5–8.1)

## 2021-12-14 LAB — CBC WITH DIFFERENTIAL (CANCER CENTER ONLY)
Abs Immature Granulocytes: 0.04 10*3/uL (ref 0.00–0.07)
Basophils Absolute: 0.1 10*3/uL (ref 0.0–0.1)
Basophils Relative: 1 %
Eosinophils Absolute: 0.4 10*3/uL (ref 0.0–0.5)
Eosinophils Relative: 5 %
HCT: 37.5 % — ABNORMAL LOW (ref 39.0–52.0)
Hemoglobin: 12 g/dL — ABNORMAL LOW (ref 13.0–17.0)
Immature Granulocytes: 1 %
Lymphocytes Relative: 10 %
Lymphs Abs: 0.8 10*3/uL (ref 0.7–4.0)
MCH: 27.3 pg (ref 26.0–34.0)
MCHC: 32 g/dL (ref 30.0–36.0)
MCV: 85.4 fL (ref 80.0–100.0)
Monocytes Absolute: 0.6 10*3/uL (ref 0.1–1.0)
Monocytes Relative: 7 %
Neutro Abs: 6.6 10*3/uL (ref 1.7–7.7)
Neutrophils Relative %: 76 %
Platelet Count: 254 10*3/uL (ref 150–400)
RBC: 4.39 MIL/uL (ref 4.22–5.81)
RDW: 16.2 % — ABNORMAL HIGH (ref 11.5–15.5)
WBC Count: 8.5 10*3/uL (ref 4.0–10.5)
nRBC: 0 % (ref 0.0–0.2)

## 2021-12-14 NOTE — Progress Notes (Signed)
Hematology and Oncology Follow Up Visit ? ?[name] ?[id number] ?April 20, 1956 66 y.o. ?12/14/2021 2:42 PM ?[name], [name]  ? ?Principle Diagnosis: 66 year old man with castration-sensitive advanced prostate cancer diagnosed in 2021.  He was found to have Gleason score of 4+5, and PSA of 71.3.  ? ? ?Prior Therapy: ? ?He is status post a prostate biopsy which showed a Gleason score 4+5 = 08 August 2020. ? ?He is status post definitive radiation therapy to the prostate with IMRT and seed implant boost completed in June 2022. ? ?Current therapy: ? ?Androgen deprivation therapy under the care of Dr. Tresa Moore.  He will be receiving that every 6 months to complete 2 years. ? ?Zytiga 1000 mg daily with prednisone started in February 2022 to complete 2 years. ? ? ? ?Interim History: [name] presents today for a follow-up visit.  Since the last visit, he reports no major changes in his health.  He continues to tolerate Zytiga without any major complaints.  He denies any nausea, vomiting or abdominal pain.  He denies any excessive fatigue or tiredness.  He denies any recent hospitalizations or illnesses. ? ? ? ? ?Medications: Updated on review. ?Current Outpatient Medications  ?Medication Sig Dispense Refill  ? abiraterone acetate (ZYTIGA) 250 MG tablet Take 4 tablets (1,000 mg total) by mouth daily. Take on an empty stomach 1 hour before or 2 hours after a meal 120 tablet 0  ? amLODipine (NORVASC) 10 MG tablet TAKE 1 TABLET (10 MG TOTAL) BY MOUTH DAILY. 90 tablet 1  ? atorvastatin (LIPITOR) 40 MG tablet Take 1 tablet (40 mg total) by mouth daily. 90 tablet 3  ? carvedilol (COREG) 3.125 MG tablet Take 1 tablet by mouth 2 (two) times daily with a meal. 60 tablet 11  ? losartan (COZAAR) 25 MG tablet Take 1 tablet (25 mg total) by mouth at bedtime. 90 tablet 0  ? nitroGLYCERIN (NITROSTAT) 0.4 MG SL tablet PLACE 1 TABLET UNDER THE TONGUE EVERY 5 MINUTES AS NEEDED FOR CHEST PAIN. (Patient not  taking: Reported on 05/08/2021) 10 tablet 0  ? predniSONE (DELTASONE) 5 MG tablet Take 1 tablet (5 mg total) by mouth daily with breakfast. 90 tablet 3  ? selenium sulfide (SELSUN) 2.5 % shampoo Apply 1 application topically daily as needed for irritation 120 mL 12  ? spironolactone (ALDACTONE) 25 MG tablet Take 0.5 tablets (12.5 mg total) by mouth at bedtime. (Patient taking differently: Take 12.5 mg by mouth daily.) 90 tablet 0  ? tamsulosin (FLOMAX) 0.4 MG CAPS capsule Take 1 capsule (0.4 mg total) by mouth daily. 30 capsule 11  ? triamcinolone cream (KENALOG) 0.5 % APPLY TO THE AFFECTED AREAS DAILY AS NEEDED. 30 g 3  ? ?Current Facility-Administered Medications  ?Medication Dose Route Frequency Provider Last Rate Last Admin  ? 0.9 %  sodium chloride infusion  500 mL Intravenous Once Pyrtle, Lajuan Lines, MD      ? ? ? ?Allergies: No Known Allergies ? ? ? ?Physical Exam: ? ? ?Blood pressure 124/68, pulse 68, temperature 98.1 ?F (36.7 ?C), temperature source Temporal, resp. rate 17, height 6' (1.829 m), weight 212 lb 4.8 oz (96.3 kg), SpO2 98 %. ? ? ? ?ECOG:  1 ? ? ? ?General appearance: Alert, awake without any distress. ?Head: Atraumatic without abnormalities ?Oropharynx: Without any thrush or ulcers. ?Eyes: No scleral icterus. ?Lymph nodes: No lymphadenopathy noted in the cervical, supraclavicular, or axillary nodes ?Heart:regular rate and rhythm, without any murmurs or gallops.   ?Lung:  Clear to auscultation without any rhonchi, wheezes or dullness to percussion. ?Abdomin: Soft, nontender without any shifting dullness or ascites. ?Musculoskeletal: No clubbing or cyanosis. ?Neurological: No motor or sensory deficits. ?Skin: No rashes or lesions. ? ? ? ? ? ?Lab Results: ?Lab Results  ?Component Value Date  ? WBC 7.7 08/17/2021  ? HGB 11.5 (L) 08/17/2021  ? HCT 34.8 (L) 08/17/2021  ? MCV 86.8 08/17/2021  ? PLT 215 08/17/2021  ? ?  Chemistry   ?   ?Component Value Date/Time  ? NA 139 08/17/2021 1309  ? NA 139 06/02/2020  1124  ? K 4.5 08/17/2021 1309  ? CL 105 08/17/2021 1309  ? CO2 25 08/17/2021 1309  ? BUN 23 08/17/2021 1309  ? BUN 17 06/02/2020 1124  ? CREATININE 1.22 08/17/2021 1309  ? CREATININE 1.35 (H) 03/22/2016 1127  ?    ?Component Value Date/Time  ? CALCIUM 9.0 08/17/2021 1309  ? ALKPHOS 61 08/17/2021 1309  ? AST 18 08/17/2021 1309  ? ALT 17 08/17/2021 1309  ? BILITOT 0.4 08/17/2021 1309  ?  ? ? ? Latest Reference Range & Units 01/12/21 15:04 04/17/21 14:41 08/17/21 13:09  ?Prostate Specific Ag, Serum 0.0 - 4.0 ng/mL 0.4 <0.1 <0.1  ? ? ? ? ?Impression and Plan: ? ? ?66 year old man with: ?  ?1.    Advanced prostate cancer diagnosed in November 2021.  He presented with castration-sensitive locally advanced.  ?  ?The natural course of this disease was reviewed at this time and treatment choices were discussed.  He is currently on Zytiga with androgen deprivation without any major complications at this time.  Risks and benefits of continuing this treatment to complete 2 years of therapy with androgen deprivation were reiterated.  Given his high risk disease it is reasonable to continue this treatment despite no clear-cut evidence of metastasis detected on initial screening. ? ? ?2.  Androgen deprivation therapy: This will be continued for a total of 2 years.  He is currently receiving that under the care of Dr. Tresa Moore. ? ? ?3.  Hypertension: His blood pressure is within normal range without any additional changes or adjustments.  We will continue to monitor on Zytiga. ? ?4.  Electrolyte imbalance and abnormal liver function test: None detected at this time we will continue to monitor on Zytiga. ?  ?5.  Follow-up: He will return in 4 months for a follow-up evaluation ?  ?  ?30  minutes were dedicated to this visit.  The time was spent on reviewing laboratory data, disease status update and outlining future plan of care discussion. ?  ?  ? ? ?Tony Button, MD ?3/17/20232:42 PM ? ?

## 2021-12-15 LAB — PROSTATE-SPECIFIC AG, SERUM (LABCORP): Prostate Specific Ag, Serum: <0.1 ng/mL (ref 0.0–4.0)

## 2021-12-17 ENCOUNTER — Telehealth: Payer: Self-pay | Admitting: *Deleted

## 2021-12-17 ENCOUNTER — Other Ambulatory Visit (HOSPITAL_COMMUNITY): Payer: Self-pay

## 2021-12-17 ENCOUNTER — Other Ambulatory Visit: Payer: Self-pay | Admitting: Family Medicine

## 2021-12-17 DIAGNOSIS — C61 Malignant neoplasm of prostate: Secondary | ICD-10-CM | POA: Diagnosis not present

## 2021-12-17 DIAGNOSIS — I5022 Chronic systolic (congestive) heart failure: Secondary | ICD-10-CM

## 2021-12-17 MED ORDER — LOSARTAN POTASSIUM 25 MG PO TABS
25.0000 mg | ORAL_TABLET | Freq: Every day | ORAL | 0 refills | Status: DC
Start: 2021-12-17 — End: 2022-03-26
  Filled 2021-12-17: qty 90, 90d supply, fill #0

## 2021-12-17 MED FILL — Selenium Sulfide Lotion 2.5%: CUTANEOUS | 30 days supply | Qty: 120 | Fill #2 | Status: AC

## 2021-12-17 NOTE — Telephone Encounter (Signed)
-----   Message from Wyatt Portela, MD sent at 12/17/2021  9:11 AM EDT ----- ?Please let him know his PSA is down ?

## 2021-12-17 NOTE — Telephone Encounter (Signed)
Per Dr.Shadad, called pt with message below. Pt verbalized understanding.  ?

## 2021-12-18 ENCOUNTER — Ambulatory Visit (INDEPENDENT_AMBULATORY_CARE_PROVIDER_SITE_OTHER): Payer: Medicare Other

## 2021-12-18 ENCOUNTER — Other Ambulatory Visit: Payer: Self-pay

## 2021-12-18 VITALS — Ht 72.0 in | Wt 212.0 lb

## 2021-12-18 DIAGNOSIS — Z0000 Encounter for general adult medical examination without abnormal findings: Secondary | ICD-10-CM | POA: Diagnosis not present

## 2021-12-18 NOTE — Patient Instructions (Addendum)
Thank you for taking time to come for your Medicare Wellness Visit. I appreciate your ongoing commitment to your health goals. Please review the following plan we discussed and let me know if I can assist you in the future.  ?  ?These are the goals we discussed: ? ? Goals   ? ?  Weight (lb) < 200 lb (90.7 kg)   ? ?  ? ?We also discussed recommended health maintenance. As discussed, you are due for: ?Health Maintenance  ?Topic Date Due  ? Zoster Vaccines- Shingrix (1 of 2) Never done  ? COVID-19 Vaccine (4 - Booster for Pfizer series) 10/29/2021  ? COLONOSCOPY (Pts 45-60yr Insurance coverage will need to be confirmed)  08/31/2026  ? TETANUS/TDAP  04/15/2028  ? Pneumonia Vaccine 68 Years old  Completed  ? INFLUENZA VACCINE  Completed  ? Hepatitis C Screening  Completed  ? HPV VACCINES  Aged Out  ? ?PCP apt scheduled for 5/3. ?Shingles vaccine you can get at your pharmacy.  ?Discuss additional covid booster with PCP. ?Fill out advance directive packet as discussed.  ? ?Preventive Care 663Years and Older, Male ?Preventive care refers to lifestyle choices and visits with your health care provider that can promote health and wellness. Preventive care visits are also called wellness exams. ?What can I expect for my preventive care visit? ?Counseling ?During your preventive care visit, your health care provider may ask about your: ?Medical history, including: ?Past medical problems. ?Family medical history. ?History of falls. ?Current health, including: ?Emotional well-being. ?Home life and relationship well-being. ?Sexual activity. ?Memory and ability to understand (cognition). ?Lifestyle, including: ?Alcohol, nicotine or tobacco, and drug use. ?Access to firearms. ?Diet, exercise, and sleep habits. ?Work and work eStatistician ?Sunscreen use. ?Safety issues such as seatbelt and bike helmet use. ?Physical exam ?Your health care provider will check your: ?Height and weight. These may be used to calculate your BMI (body  mass index). BMI is a measurement that tells if you are at a healthy weight. ?Waist circumference. This measures the distance around your waistline. This measurement also tells if you are at a healthy weight and may help predict your risk of certain diseases, such as type 2 diabetes and high blood pressure. ?Heart rate and blood pressure. ?Body temperature. ?Skin for abnormal spots. ?What immunizations do I need? ?Vaccines are usually given at various ages, according to a schedule. Your health care provider will recommend vaccines for you based on your age, medical history, and lifestyle or other factors, such as travel or where you work. ?What tests do I need? ?Screening ?Your health care provider may recommend screening tests for certain conditions. This may include: ?Lipid and cholesterol levels. ?Diabetes screening. This is done by checking your blood sugar (glucose) after you have not eaten for a while (fasting). ?Hepatitis C test. ?Hepatitis B test. ?HIV (human immunodeficiency virus) test. ?STI (sexually transmitted infection) testing, if you are at risk. ?Lung cancer screening. ?Colorectal cancer screening. ?Prostate cancer screening. ?Abdominal aortic aneurysm (AAA) screening. You may need this if you are a current or former smoker. ?Talk with your health care provider about your test results, treatment options, and if necessary, the need for more tests. ?Follow these instructions at home: ?Eating and drinking ? ?Eat a diet that includes fresh fruits and vegetables, whole grains, lean protein, and low-fat dairy products. Limit your intake of foods with high amounts of sugar, saturated fats, and salt. ?Take vitamin and mineral supplements as recommended by your health care provider. ?Do  not drink alcohol if your health care provider tells you not to drink. ?If you drink alcohol: ?Limit how much you have to 0-2 drinks a day. ?Know how much alcohol is in your drink. In the [place] one drink equals one 12 oz  bottle of beer (355 mL), one 5 oz glass of wine (148 mL), or one 1? oz glass of hard liquor (44 mL). ?Lifestyle ?Brush your teeth every morning and night with fluoride toothpaste. Floss one time each day. ?Exercise for at least 30 minutes 5 or more days each week. ?Do not use any products that contain nicotine or tobacco. These products include cigarettes, chewing tobacco, and vaping devices, such as e-cigarettes. If you need help quitting, ask your health care provider. ?Do not use drugs. ?If you are sexually active, practice safe sex. Use a condom or other form of protection to prevent STIs. ?Take aspirin only as told by your health care provider. Make sure that you understand how much to take and what form to take. Work with your health care provider to find out whether it is safe and beneficial for you to take aspirin daily. ?Ask your health care provider if you need to take a cholesterol-lowering medicine (statin). ?Find healthy ways to manage stress, such as: ?Meditation, yoga, or listening to music. ?Journaling. ?Talking to a trusted person. ?Spending time with friends and family. ?Safety ?Always wear your seat belt while driving or riding in a vehicle. ?Do not drive: ?If you have been drinking alcohol. Do not ride with someone who has been drinking. ?When you are tired or distracted. ?While texting. ?If you have been using any mind-altering substances or drugs. ?Wear a helmet and other protective equipment during sports activities. ?If you have firearms in your house, make sure you follow all gun safety procedures. ?Minimize exposure to UV radiation to reduce your risk of skin cancer. ?What's next? ?Visit your health care provider once a year for an annual wellness visit. ?Ask your health care provider how often you should have your eyes and teeth checked. ?Stay up to date on all vaccines. ?This information is not intended to replace advice given to you by your health care provider. Make sure you discuss any  questions you have with your health care provider. ?Document Revised: 03/14/2021 Document Reviewed: 03/14/2021 ?Elsevier Patient Education ? [place]. ? ?Our clinic's number is [phone]. Please call with questions or concerns about what we discussed today.  ?  ?

## 2021-12-18 NOTE — Progress Notes (Signed)
Subjective:   [name] is a 66 y.o. male who presents for Medicare Annual/Subsequent preventive examination.  The patient consented to a virtual visit. Patient consented to have virtual visit and was identified by name and date of birth. Method of visit: Telephone  Encounter participants: Patient: [name] - located at Home Nurse/Provider: Steva Colder - located at [facility] Others (if applicable): NA  Review of Systems: Defer to PCP  Cardiac Risk Factors include: advanced age (>34men, >47 women);male gender;hypertension;dyslipidemia  Objective:    Vitals: Ht 6' (1.829 m)   Wt 212 lb (96.2 kg)   BMI 28.75 kg/m   Body mass index is 28.75 kg/m.  Advanced Directives 12/18/2021 10/24/2021 10/09/2021 10/02/2021 09/03/2021 05/08/2021 01/23/2021  Does Patient Have a Medical Advance Directive? No No No No No No No  Would patient like information on creating a medical advance directive? Yes (MAU/Ambulatory/Procedural Areas - Information given) No - Patient declined No - Patient declined No - Patient declined No - Patient declined No - Patient declined No - Patient declined  Pre-existing out of facility DNR order (yellow form or pink MOST form) - - - - - - -   Tobacco Social History   Tobacco Use  Smoking Status Former   Types: Cigars   Quit date: 01/01/2012   Years since quitting: 9.9   Passive exposure: Past  Smokeless Tobacco Never     Clinical Intake:  Pre-visit preparation completed: Yes  Nutritional Status: BMI 25 -29 Overweight Diabetes: No  How often do you need to have someone help you when you read instructions, pamphlets, or other written materials from your doctor or pharmacy?: 2 - Rarely What is the last grade level you completed in school?: High School  Interpreter Needed?: No  Past Medical History:  Diagnosis Date   Alcohol abuse    still drink beer   CHF (congestive heart failure) (HCC)    Chronic combined systolic and diastolic heart failure, NYHA class 3  (HCC) 06/02/2012   Chronic kidney disease    stage 3 no nephrologist   Chronic systolic heart failure (HCC) 06/02/2012   Gout    none in years   History of radiation therapy    completed 03-22-2021   Hyperlipidemia    Hypertension    Need for prophylactic vaccination and inoculation against single disease 04/15/2018   NICM (nonischemic cardiomyopathy) (HCC) 09/04/2012   Prostate cancer (HCC) 07/2020   Psoriasis    Possible psoriasis in the past   Pulmonary nodule, right 11/03/2012   Past Surgical History:  Procedure Laterality Date   COLONOSCOPY     CYSTOSCOPY  01/23/2021   Procedure: CYSTOSCOPY FLEXIBLE;  Surgeon: Belva Agee, MD;  Location: [facility];  Service: Urology;;  no seeds found in bladder   KNEE SURGERY Left 1970's   arthroscopic knee out of joint   LEFT AND RIGHT HEART CATHETERIZATION WITH CORONARY ANGIOGRAM N/A 05/25/2012   Procedure: LEFT AND RIGHT HEART CATHETERIZATION WITH CORONARY ANGIOGRAM;  Surgeon: Dolores Patty, MD;  Location: [facility] CATH LAB;  Service: Cardiovascular;  Laterality: N/A;   POLYPECTOMY     RADIOACTIVE SEED IMPLANT N/A 01/23/2021   Procedure: RADIOACTIVE SEED IMPLANT/BRACHYTHERAPY IMPLANT;  Surgeon: Belva Agee, MD;  Location: [facility];  Service: Urology;  Laterality: N/A;   57  seeds implanted   SKIN GRAFT     L arm got caught in machine in 1979   SPACE OAR INSTILLATION N/A 01/23/2021   Procedure: SPACE  OAR INSTILLATION;  Surgeon: Belva Agee, MD;  Location: [facility];  Service: Urology;  Laterality: N/A;   Family History  Problem Relation Age of Onset   Stroke Mother    Heart failure Father    Cancer Sister        Skin   Cancer Brother    Prostate cancer Brother    Colon polyps Brother    Colon polyps Brother    Prostate cancer Brother    Breast cancer Daughter    Heart failure Other        Uncle. Possible MI?   Colon cancer Neg Hx    Esophageal cancer Neg Hx     Rectal cancer Neg Hx    Stomach cancer Neg Hx    Social History   Socioeconomic History   Marital status: Legally Separated    Spouse name: Not on file   Number of children: 7   Years of education: 12   Highest education level: 12th grade  Occupational History    Comment: Retired. Formerly disabled due to heart failure.  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 Planning to move out next week. One of his daughters will be coming to stay with him.       Mobile home with some steps, has handrails. Non-working smoke alarms.    Likes to eat meat, pasta, vegetables, and fruit. Sweet tea is what he likes to drink, V8 juice.      Patient sings in choir.       Social Determinants of Health   Financial Resource Strain: Low Risk    Difficulty of Paying Living Expenses: Not hard at all  Food Insecurity: No Food Insecurity   Worried About Programme researcher, broadcasting/film/video in the Last Year: Never true   Ran Out of Food in the Last Year: Never true  Transportation Needs: No Transportation Needs   Lack of Transportation (Medical): No   Lack of Transportation (Non-Medical): No  Physical Activity: Inactive   Days of Exercise per Week: 0 days   Minutes of Exercise per Session: 0 min  Stress: No Stress Concern Present   Feeling of Stress : Only a  little  Social Connections: Moderately Integrated   Frequency of Communication with Friends and Family: More than three times a week   Frequency of Social Gatherings with Friends and Family: More than three times a week   Attends Religious Services: More than 4 times per year   Active Member of [employer] or Organizations: Yes   Attends Banker Meetings: More than 4 times per year   Marital Status: Separated   Outpatient Encounter Medications as of  12/18/2021  Medication Sig   abiraterone acetate (ZYTIGA) 250 MG tablet Take 4 tablets (1,000 mg total) by mouth daily. Take on an empty stomach 1 hour before or 2 hours after a meal   amLODipine (NORVASC) 10 MG tablet TAKE 1 TABLET (10 MG TOTAL) BY MOUTH DAILY.   atorvastatin (LIPITOR) 40 MG tablet Take 1 tablet (40 mg total) by mouth daily.   carvedilol (COREG) 3.125 MG tablet Take 1 tablet by mouth 2 (two) times daily with a meal.   losartan (COZAAR) 25 MG tablet Take 1 tablet (25 mg total) by mouth at bedtime.   predniSONE (DELTASONE) 5 MG tablet Take 1 tablet (5 mg total) by mouth daily with breakfast.   selenium sulfide (SELSUN) 2.5 % shampoo Apply 1 application topically daily as needed for irritation   spironolactone (ALDACTONE) 25 MG tablet Take 0.5 tablets (12.5 mg total) by mouth at bedtime. (Patient taking differently: Take 12.5 mg by mouth daily.)   tamsulosin (FLOMAX) 0.4 MG CAPS capsule Take 1 capsule (0.4 mg total) by mouth daily.   triamcinolone cream (KENALOG) 0.5 % APPLY TO THE AFFECTED AREAS DAILY AS NEEDED.   nitroGLYCERIN (NITROSTAT) 0.4 MG SL tablet PLACE 1 TABLET UNDER THE TONGUE EVERY 5 MINUTES AS NEEDED FOR CHEST PAIN. (Patient not taking: Reported on 05/08/2021)   [DISCONTINUED] colchicine 0.6 MG tablet On day 1, take 2 tablets, followed by 1 tablet after 1 hour.  On day 2 and thereafter, take 1 tablet daily.   Facility-Administered Encounter Medications as of 12/18/2021  Medication   0.9 %  sodium chloride infusion    Activities of Daily Living In your present state of health, do you have any difficulty performing the following activities: 12/18/2021 01/23/2021  Hearing? N N  Vision? N N  Difficulty concentrating or making decisions? N N  Walking or climbing stairs? Y N  Dressing or bathing? N N  Doing errands, shopping? N -  Preparing Food and eating ? N -  Using the Toilet? N -  In the past six months, have you accidently leaked urine? N -  Do you have problems with loss of bowel control? N -  Managing your Medications? N -  Managing your Finances? N -  Housekeeping or managing your Housekeeping? N -  Some recent data might be hidden   Patient Care Team: Sabino Dick, DO as PCP - General (Family Medicine) Sebastian Ache, MD as Consulting Physician (Urology) Margaretmary Dys, MD as Consulting Physician (Radiation Oncology) Belva Agee, MD as Consulting Physician (Urology) Pyrtle, Carie Caddy, MD as Consulting Physician (Gastroenterology)   Assessment:   This is a routine wellness examination for [facility].  Exercise Activities and Dietary recommendations Current Exercise Habits: The patient does not participate in regular exercise at present, Exercise limited by: cardiac condition(s)   Goals      Weight (lb) < 200 lb (90.7 kg)       Fall Risk Fall Risk  12/18/2021 10/24/2021 10/09/2021 10/02/2021 09/03/2021  Falls in the past year? 0 0 0 0 0  Number falls in past yr: 0 - 0 0 0  Injury with Fall? 0 - 0 0 0  Risk for fall due to : No Fall Risks - - - -  Follow up Falls prevention discussed - - - -   Is the patient's home free of loose throw rugs in walkways, pet beds, electrical cords, etc?   yes      Grab bars in the bathroom? yes  Handrails on the stairs?   yes      Adequate lighting?   yes  Patient rating of health (0-10): 8   Depression Screen PHQ 2/9 Scores 12/18/2021 10/24/2021 10/09/2021 10/02/2021  PHQ - 2 Score 2 2 2 2   PHQ- 9 Score 3 3 4 3    Cognitive Function MMSE  - Mini Mental State Exam 12/11/2018  Orientation to time 5  Orientation to Place 5  Registration 3  Attention/ Calculation 5  Recall 3  Language- name 2 objects 2  Language- repeat 1  Language- follow 3 step command 3  Language- read & follow direction 1  Write a sentence 1  Copy design 1  Total score 30   6CIT Screen 12/18/2021 12/11/2018  What Year? 0 points 0 points  What month? 0 points 0 points  What time? 0 points 0 points  Count back from 20 0 points 0 points  Months in reverse 0 points 0 points  Repeat phrase 0 points 0 points  Total Score 0 0   Immunization History  Administered Date(s) Administered   Fluad Quad(high Dose 65+) 08/17/2019, 09/03/2021   Influenza,inj,Quad PF,6+ Mos 07/22/2017, 07/16/2018   PFIZER(Purple Top)SARS-COV-2 Vaccination 12/20/2019, 01/10/2020   PNEUMOCOCCAL CONJUGATE-20 10/24/2021   Pfizer Covid-19 Vaccine Bivalent Booster 53yrs & up 09/03/2021   Unspecified SARS-COV-2 Vaccination 07/31/2021   Qualifies for Shingles Vaccine? Yes   Shingrix Completed: No, Education has been provided regarding the importance of this vaccine. Advised may receive this vaccine at local pharmacy or Health Dept. Aware to provide a copy of the vaccination record if obtained from local pharmacy or Health Dept. Verbalized acceptance and understanding.  Screening Tests Health Maintenance  Topic Date Due   Zoster Vaccines- Shingrix (1 of 2) Never done   COVID-19 Vaccine (4 - Booster for Pfizer series) 10/29/2021   COLONOSCOPY (Pts 45-31yrs Insurance coverage will need to be confirmed)  08/31/2026   TETANUS/TDAP  04/15/2028   Pneumonia Vaccine 17+ Years old  Completed   INFLUENZA VACCINE  Completed   Hepatitis C Screening  Completed   HPV VACCINES  Aged Out   Cancer Screenings: Lung: Low Dose CT Chest recommended if Age 13-80 years, 30 pack-year currently smoking OR have quit w/in 15years. Patient does not qualify. Colorectal: UTD-08/31/2021  Additional  Screenings: Hepatitis C Screening: Completed  HIV Screening: Completed   Plan:  PCP apt scheduled for 5/3. Shingles vaccine you can get at your pharmacy.  Discuss additional covid booster with PCP. Fill out advance directive packet as discussed.   I have personally reviewed and noted the following in the patients chart:   Medical and social history Use of alcohol, tobacco or illicit drugs  Current medications and supplements Functional ability and status Nutritional status Physical activity Advanced directives List of other physicians Hospitalizations, surgeries, and ER visits in previous 12 months Vitals Screenings to include cognitive, depression, and falls Referrals and appointments  In addition, I have reviewed and discussed with patient certain preventive protocols, quality metrics, and best practice recommendations. A written personalized care plan for preventive services as well as general preventive health recommendations were provided to patient.  This visit was conducted virtually in the setting of the COVID19 pandemic.   Steva Colder, CMA  12/18/2021

## 2021-12-18 NOTE — Progress Notes (Signed)
I have reviewed this visit and agree with the documentation.   

## 2022-01-01 ENCOUNTER — Other Ambulatory Visit (HOSPITAL_COMMUNITY): Payer: Self-pay

## 2022-01-03 ENCOUNTER — Other Ambulatory Visit: Payer: Self-pay | Admitting: Oncology

## 2022-01-03 ENCOUNTER — Other Ambulatory Visit (HOSPITAL_COMMUNITY): Payer: Self-pay

## 2022-01-03 DIAGNOSIS — C61 Malignant neoplasm of prostate: Secondary | ICD-10-CM

## 2022-01-03 MED ORDER — ABIRATERONE ACETATE 250 MG PO TABS
1000.0000 mg | ORAL_TABLET | Freq: Every day | ORAL | 0 refills | Status: DC
  Filled 2022-01-03: qty 120, 30d supply, fill #0

## 2022-01-08 ENCOUNTER — Other Ambulatory Visit: Payer: Self-pay

## 2022-01-08 ENCOUNTER — Other Ambulatory Visit: Payer: Self-pay | Admitting: Student in an Organized Health Care Education/Training Program

## 2022-01-08 ENCOUNTER — Other Ambulatory Visit (HOSPITAL_COMMUNITY): Payer: Self-pay

## 2022-01-08 MED FILL — Spironolactone Tab 25 MG: ORAL | 90 days supply | Qty: 45 | Fill #0 | Status: CN

## 2022-01-09 ENCOUNTER — Other Ambulatory Visit (HOSPITAL_COMMUNITY): Payer: Self-pay

## 2022-01-10 ENCOUNTER — Other Ambulatory Visit (HOSPITAL_COMMUNITY): Payer: Self-pay

## 2022-01-25 ENCOUNTER — Other Ambulatory Visit: Payer: Self-pay | Admitting: Oncology

## 2022-01-25 ENCOUNTER — Other Ambulatory Visit (HOSPITAL_COMMUNITY): Payer: Self-pay

## 2022-01-25 DIAGNOSIS — C61 Malignant neoplasm of prostate: Secondary | ICD-10-CM

## 2022-01-25 MED ORDER — ABIRATERONE ACETATE 250 MG PO TABS
1000.0000 mg | ORAL_TABLET | Freq: Every day | ORAL | 0 refills | Status: DC
  Filled 2022-01-25: qty 120, 30d supply, fill #0

## 2022-01-30 ENCOUNTER — Ambulatory Visit: Payer: Medicare Other | Admitting: Family Medicine

## 2022-01-30 ENCOUNTER — Other Ambulatory Visit (HOSPITAL_COMMUNITY): Payer: Self-pay

## 2022-01-31 ENCOUNTER — Other Ambulatory Visit (HOSPITAL_COMMUNITY): Payer: Self-pay

## 2022-01-31 MED ORDER — TAMSULOSIN HCL 0.4 MG PO CAPS
0.4000 mg | ORAL_CAPSULE | Freq: Every day | ORAL | 11 refills | Status: DC
  Filled 2022-01-31: qty 30, 30d supply, fill #0
  Filled 2022-03-04: qty 30, 30d supply, fill #1
  Filled 2022-03-26: qty 30, 30d supply, fill #2
  Filled 2022-04-29: qty 30, 30d supply, fill #3
  Filled 2022-05-30: qty 30, 30d supply, fill #4
  Filled 2022-07-01: qty 30, 30d supply, fill #5
  Filled 2022-07-31: qty 30, 30d supply, fill #6
  Filled 2022-09-02: qty 30, 30d supply, fill #7
  Filled 2022-10-07: qty 30, 30d supply, fill #8
  Filled 2022-11-27: qty 30, 30d supply, fill #9
  Filled 2023-01-03: qty 30, 30d supply, fill #10

## 2022-01-31 MED FILL — Spironolactone Tab 25 MG: ORAL | 90 days supply | Qty: 45 | Fill #0 | Status: AC

## 2022-02-04 ENCOUNTER — Other Ambulatory Visit (HOSPITAL_COMMUNITY): Payer: Self-pay

## 2022-02-04 NOTE — Progress Notes (Signed)
? ? ?  SUBJECTIVE:  ? ?CHIEF COMPLAINT / HPI:  ? ?Bumps on Chest ?States that he noticed 2 bumps in the center of his chest about 2 to 3 weeks ago.  They are not painful, are not enlarging. ? ?CKD Stage 3a ?Last creatinine 1.43 in March, GFR 54. ? ?Hypertension ?Currently on amlodipine 10 mg, carvedilol 3.125 mg twice daily, losartan 25 mg, spironolactone 25 mg daily. Does not check BP at home. Denies chest pain, headache, shortness of breath, lower extremity edema, visual changes. ? ?Hyperlipidemia ?Last lipid panel 08/2021, LDL 52. Currently on atorvastatin 40 mg. ? ?Gout ?Last Uric acid level 3.9 in January. No recent flares. ? ?PERTINENT  PMH / PSH:  ?Hyperlipidemia, NICM, gout, history of prostate cancer ? ? ?OBJECTIVE:  ? ?BP 134/67   Pulse 60   Ht 6' (1.829 m)   Wt 216 lb 6.4 oz (98.2 kg)   SpO2 99%   BMI 29.35 kg/m?   ? ?General: NAD, pleasant, able to participate in exam ?Cardiac: RRR, no murmurs. ?Respiratory: CTAB, normal effort, No wheezes, rales or rhonchi ?Skin: warm and dry. Center of chest with approximately 0.5x0.5 cm soft circular lump most consistent with lipoma ?Psych: Normal affect and mood ? ?ASSESSMENT/PLAN:  ? ?Hypertension ?Well-controlled today.  No medication refills needed at this time.  Continue home medications. ? ?CKD (chronic kidney disease), stage III ([place]) ?Last CMP in March.  Given hypertension and CKD, would benefit from SGLT2 inhibitor therapy, will start on empagliflozin 10 mg daily. Urine microalbumin today.  Scheduled for future lab visit to check BMP.  Scheduled to follow-up with me in 1 month to go over results, check medications (as patient did not bring his and is not quite sure what he is taking and why). ? ?Gout ?Stable without any recent flares.  ? ?Mixed hyperlipidemia ?Stable continue high intensity statin. ? ?Healthcare maintenance ?Up to date on COVID vaccines. Declines shingrix due to cost. Up to date on colonoscopy.  ? ?Lipoma ?2 bumps on chest most consistent  with lipoma given they are small, mobile, soft, and nonpainful.  No red flag symptoms at this time.  Discussed with patient that if they begin to hurt, or he notices that they are enlarging he should return.  Can consider imaging at that time. ? ? ?Sharion Settler, DO ?[place]  ? ?

## 2022-02-08 ENCOUNTER — Encounter: Payer: Self-pay | Admitting: Family Medicine

## 2022-02-08 ENCOUNTER — Other Ambulatory Visit (HOSPITAL_COMMUNITY): Payer: Self-pay

## 2022-02-08 ENCOUNTER — Ambulatory Visit (INDEPENDENT_AMBULATORY_CARE_PROVIDER_SITE_OTHER): Payer: Medicare Other | Admitting: Family Medicine

## 2022-02-08 VITALS — BP 134/67 | HR 60 | Ht 72.0 in | Wt 216.4 lb

## 2022-02-08 DIAGNOSIS — I10 Essential (primary) hypertension: Secondary | ICD-10-CM

## 2022-02-08 DIAGNOSIS — D171 Benign lipomatous neoplasm of skin and subcutaneous tissue of trunk: Secondary | ICD-10-CM | POA: Diagnosis not present

## 2022-02-08 DIAGNOSIS — M109 Gout, unspecified: Secondary | ICD-10-CM

## 2022-02-08 DIAGNOSIS — D179 Benign lipomatous neoplasm, unspecified: Secondary | ICD-10-CM | POA: Insufficient documentation

## 2022-02-08 DIAGNOSIS — E782 Mixed hyperlipidemia: Secondary | ICD-10-CM | POA: Diagnosis not present

## 2022-02-08 DIAGNOSIS — Z0000 Encounter for general adult medical examination without abnormal findings: Secondary | ICD-10-CM

## 2022-02-08 DIAGNOSIS — N1831 Chronic kidney disease, stage 3a: Secondary | ICD-10-CM | POA: Diagnosis not present

## 2022-02-08 MED ORDER — EMPAGLIFLOZIN 10 MG PO TABS
10.0000 mg | ORAL_TABLET | Freq: Every day | ORAL | 3 refills | Status: DC
  Filled 2022-02-08: qty 30, 30d supply, fill #0

## 2022-02-08 NOTE — Assessment & Plan Note (Addendum)
Up to date on COVID vaccines. Declines shingrix due to cost. Up to date on colonoscopy.  ?

## 2022-02-08 NOTE — Assessment & Plan Note (Signed)
Stable continue high intensity statin. ?

## 2022-02-08 NOTE — Assessment & Plan Note (Signed)
2 bumps on chest most consistent with lipoma given they are small, mobile, soft, and nonpainful.  No red flag symptoms at this time.  Discussed with patient that if they begin to hurt, or he notices that they are enlarging he should return.  Can consider imaging at that time. ?

## 2022-02-08 NOTE — Assessment & Plan Note (Signed)
Stable without any recent flares.  ?

## 2022-02-08 NOTE — Assessment & Plan Note (Addendum)
Last CMP in March.  Given hypertension and CKD, would benefit from SGLT2 inhibitor therapy, will start on empagliflozin 10 mg daily. Urine microalbumin today.  Scheduled for future lab visit to check BMP.  Scheduled to follow-up with me in 1 month to go over results, check medications (as patient did not bring his and is not quite sure what he is taking and why). ?

## 2022-02-08 NOTE — Patient Instructions (Addendum)
It was wonderful to see you today. ? ?Please bring ALL of your medications with you to every visit.  ? ?Today we talked about: ? ?-Please bring your medications with you next time! ?-I think the small bumps in your chest are due to a fatty tumor, this is not dangerous.  If they begin to enlarge or become painful, please come back. ?-We are doing a urine test to check on your kidneys. ?-I am starting on a new medication called empagliflozin.  Take 1 tablet daily.  This will help to protect your kidneys given your history of high blood pressure and chronic kidney disease. ?-Please come back for a lab only visit in 2-3 weeks. ?-Follow-up with me in 1 month, we will go over your lab work. ? ? ?Thank you for choosing [facility].  ? ?Please call [phone] with any questions about today's appointment. ? ?Please be sure to schedule follow up at the front  desk before you leave today.  ? ?Sharion Settler, DO ?PGY-2 Family Medicine   ?

## 2022-02-08 NOTE — Assessment & Plan Note (Signed)
Well-controlled today.  No medication refills needed at this time.  Continue home medications. ?

## 2022-02-10 LAB — MICROALBUMIN / CREATININE URINE RATIO
Creatinine, Urine: 133.9 mg/dL
Microalb/Creat Ratio: 9 mg/g creat (ref 0–29)
Microalbumin, Urine: 12 ug/mL

## 2022-02-11 ENCOUNTER — Encounter: Payer: Self-pay | Admitting: Family Medicine

## 2022-02-13 ENCOUNTER — Telehealth: Payer: Self-pay

## 2022-02-13 ENCOUNTER — Other Ambulatory Visit (HOSPITAL_COMMUNITY): Payer: Self-pay

## 2022-02-13 NOTE — Telephone Encounter (Signed)
Patient calls nurse line reporting difficulty picking up Jardiance.  ? ?Patient reports a 30 day supply is ~170 and a 90 day is ~400 dollars.  ? ?Will forward to PCP and Pharmacy Team for assistance.  ?

## 2022-02-14 NOTE — Telephone Encounter (Signed)
Reached out to pt regarding jardiance copay. Informed pt to reach out to [facility] and apply for [place]. Pt agreed and will reach back out to me once he gets a determination. If denied we will move forward with BI Cares PAP

## 2022-02-19 ENCOUNTER — Other Ambulatory Visit: Payer: Self-pay | Admitting: Oncology

## 2022-02-19 ENCOUNTER — Other Ambulatory Visit (HOSPITAL_COMMUNITY): Payer: Self-pay

## 2022-02-19 ENCOUNTER — Telehealth: Payer: Self-pay

## 2022-02-19 DIAGNOSIS — C61 Malignant neoplasm of prostate: Secondary | ICD-10-CM

## 2022-02-19 MED ORDER — ABIRATERONE ACETATE 250 MG PO TABS
1000.0000 mg | ORAL_TABLET | Freq: Every day | ORAL | 0 refills | Status: DC
  Filled 2022-02-22: qty 120, 30d supply, fill #0

## 2022-02-19 NOTE — Telephone Encounter (Signed)
Pt returned phone call.  Pt was denied LIS from [facility] and wants to move forward with [facility] Cares application for Jardiance. Pt aware he will need the denial letter from [facility] to submit with application. Pt would like application mailed to his home.

## 2022-02-19 NOTE — Telephone Encounter (Signed)
Mailed bi cares application to pt's home.

## 2022-02-22 ENCOUNTER — Other Ambulatory Visit (HOSPITAL_COMMUNITY): Payer: Self-pay

## 2022-02-24 ENCOUNTER — Other Ambulatory Visit: Payer: Self-pay

## 2022-02-24 ENCOUNTER — Other Ambulatory Visit: Payer: Self-pay | Admitting: Student in an Organized Health Care Education/Training Program

## 2022-02-24 DIAGNOSIS — I10 Essential (primary) hypertension: Secondary | ICD-10-CM

## 2022-02-26 ENCOUNTER — Other Ambulatory Visit (HOSPITAL_COMMUNITY): Payer: Self-pay

## 2022-02-26 ENCOUNTER — Other Ambulatory Visit: Payer: Medicare Other

## 2022-02-26 DIAGNOSIS — I10 Essential (primary) hypertension: Secondary | ICD-10-CM

## 2022-02-26 DIAGNOSIS — N1831 Chronic kidney disease, stage 3a: Secondary | ICD-10-CM

## 2022-02-26 MED FILL — Amlodipine Besylate Tab 10 MG (Base Equivalent): ORAL | 90 days supply | Qty: 90 | Fill #0 | Status: AC

## 2022-02-26 MED FILL — Atorvastatin Calcium Tab 40 MG (Base Equivalent): ORAL | 90 days supply | Qty: 90 | Fill #0 | Status: AC

## 2022-02-27 LAB — BASIC METABOLIC PANEL
BUN/Creatinine Ratio: 17 (ref 10–24)
BUN: 18 mg/dL (ref 8–27)
CO2: 25 mmol/L (ref 20–29)
Calcium: 9.1 mg/dL (ref 8.6–10.2)
Chloride: 104 mmol/L (ref 96–106)
Creatinine, Ser: 1.05 mg/dL (ref 0.76–1.27)
Glucose: 115 mg/dL — ABNORMAL HIGH (ref 70–99)
Potassium: 4.4 mmol/L (ref 3.5–5.2)
Sodium: 144 mmol/L (ref 134–144)
eGFR: 78 mL/min/{1.73_m2} (ref >59)

## 2022-03-01 ENCOUNTER — Other Ambulatory Visit (HOSPITAL_COMMUNITY): Payer: Self-pay

## 2022-03-04 ENCOUNTER — Other Ambulatory Visit (HOSPITAL_COMMUNITY): Payer: Self-pay

## 2022-03-07 NOTE — Progress Notes (Signed)
    SUBJECTIVE:   CHIEF COMPLAINT / HPI:   Follow up SGLT-2 Initiation  Given his CKD (stage 3a) and hypertension he was started on Jardiance 10 mg at last visit 5/12. His last BMP on 5/30 showed creatinine 1.05, eGFR 78. Urine microalbulin were unremarkable. States that he has not yet started on his Jardiance. It was too expensive so he is currently waiting on denial letter from [facility] to submit application to [facility] for Jardiance.  PERTINENT  PMH / PSH:  Past Medical History:  Diagnosis Date   Alcohol abuse    still drink beer   CHF (congestive heart failure) (HCC)    Chronic combined systolic and diastolic heart failure, NYHA class 3 ([place]) 06/02/2012   Chronic kidney disease    stage 3 no nephrologist   Chronic systolic heart failure ([place]) 06/02/2012   Gout    none in years   History of radiation therapy    completed 03-22-2021   Hyperlipidemia    Hypertension    Need for prophylactic vaccination and inoculation against single disease 04/15/2018   NICM (nonischemic cardiomyopathy) ([place]) 09/04/2012   Prostate cancer ([place]) 07/2020   Psoriasis    Possible psoriasis in the past   Pulmonary nodule, right 11/03/2012   OBJECTIVE:   BP 139/79   Pulse 64   Ht 6' (1.829 m)   Wt 215 lb (97.5 kg)   BMI 29.16 kg/m    General: NAD, pleasant, able to participate in exam Cardiac: RRR, no murmurs. Respiratory: CTAB, normal effort, No wheezes, rales or rhonchi Extremities: no edema or cyanosis. Skin: warm and dry, no rashes noted Psych: Normal affect and mood  ASSESSMENT/PLAN:   Hypertension BP <140/90 today. Continue current medications.   CKD (chronic kidney disease), stage III (HCC) Stage 3a, though last BMP was improved. He is in the process of proceeding with medication assistance for Jardiance. Appreciate Pharmacy's assistance with this. Discussed that given his HTN and CKD, this would be a beneficial medication for him to have IF he is able to get assistance and it is  financially reasonable for him. Advised him to schedule f/u to see me 2-4 weeks after starting Jardiance for repeat BMP. Teach back method used.     Sharion Settler, [place]

## 2022-03-08 ENCOUNTER — Ambulatory Visit (INDEPENDENT_AMBULATORY_CARE_PROVIDER_SITE_OTHER): Payer: Medicare Other | Admitting: Family Medicine

## 2022-03-08 ENCOUNTER — Encounter: Payer: Self-pay | Admitting: Family Medicine

## 2022-03-08 DIAGNOSIS — N1831 Chronic kidney disease, stage 3a: Secondary | ICD-10-CM

## 2022-03-08 DIAGNOSIS — I10 Essential (primary) hypertension: Secondary | ICD-10-CM

## 2022-03-08 NOTE — Assessment & Plan Note (Signed)
Stage 3a, though last BMP was improved. He is in the process of proceeding with medication assistance for Jardiance. Appreciate Pharmacy's assistance with this. Discussed that given his HTN and CKD, this would be a beneficial medication for him to have IF he is able to get assistance and it is financially reasonable for him. Advised him to schedule f/u to see me 2-4 weeks after starting Jardiance for repeat BMP. Teach back method used.

## 2022-03-08 NOTE — Patient Instructions (Addendum)
It was wonderful to see you today.  Please bring ALL of your medications with you to every visit.   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 This would be a great medication for you to start to help protect your kidneys, but I also don't want you breaking your bank for it. If it is not an affordable option even after the application process we can discuss.  -Once you get your Jardiance, call our office to schedule an appointment for follow up in 2-4 weeks. I would like to repeat some blood work to check on your kidneys at this time.  -Thank you for bringing your medications today!  Thank you for choosing [facility].   Please call [phone] with any questions about today's appointment.  Please be sure to schedule follow up at the front  desk before you leave today.   Sharion Settler, DO PGY-2 Family Medicine

## 2022-03-08 NOTE — Assessment & Plan Note (Signed)
BP <140/90 today. Continue current medications.

## 2022-03-12 ENCOUNTER — Telehealth: Payer: Self-pay | Admitting: Oncology

## 2022-03-12 NOTE — Telephone Encounter (Signed)
Called patient regarding upcoming July appointments, left a voicemail.

## 2022-03-26 ENCOUNTER — Other Ambulatory Visit: Payer: Self-pay | Admitting: Oncology

## 2022-03-26 ENCOUNTER — Other Ambulatory Visit: Payer: Self-pay | Admitting: Family Medicine

## 2022-03-26 ENCOUNTER — Other Ambulatory Visit (HOSPITAL_COMMUNITY): Payer: Self-pay

## 2022-03-26 DIAGNOSIS — I5022 Chronic systolic (congestive) heart failure: Secondary | ICD-10-CM

## 2022-03-26 DIAGNOSIS — C61 Malignant neoplasm of prostate: Secondary | ICD-10-CM

## 2022-03-26 MED ORDER — LOSARTAN POTASSIUM 25 MG PO TABS
25.0000 mg | ORAL_TABLET | Freq: Every day | ORAL | 1 refills | Status: DC
  Filled 2022-03-26: qty 90, 90d supply, fill #0
  Filled 2022-07-01: qty 90, 90d supply, fill #1

## 2022-03-26 MED ORDER — ABIRATERONE ACETATE 250 MG PO TABS
1000.0000 mg | ORAL_TABLET | Freq: Every day | ORAL | 0 refills | Status: DC
  Filled 2022-03-26: qty 120, 30d supply, fill #0

## 2022-03-28 ENCOUNTER — Other Ambulatory Visit: Payer: Self-pay

## 2022-03-28 ENCOUNTER — Other Ambulatory Visit (HOSPITAL_COMMUNITY): Payer: Self-pay

## 2022-03-28 MED FILL — Selenium Sulfide Lotion 2.5%: CUTANEOUS | 30 days supply | Qty: 120 | Fill #3 | Status: AC

## 2022-03-29 ENCOUNTER — Other Ambulatory Visit (HOSPITAL_COMMUNITY): Payer: Self-pay

## 2022-03-29 ENCOUNTER — Other Ambulatory Visit: Payer: Self-pay | Admitting: Student in an Organized Health Care Education/Training Program

## 2022-03-29 DIAGNOSIS — L309 Dermatitis, unspecified: Secondary | ICD-10-CM

## 2022-03-29 MED ORDER — TRIAMCINOLONE ACETONIDE 0.5 % EX CREA
1.0000 | TOPICAL_CREAM | Freq: Every day | CUTANEOUS | 3 refills | Status: DC | PRN
  Filled 2022-03-29: qty 30, 30d supply, fill #0
  Filled 2022-07-31: qty 30, 30d supply, fill #1
  Filled 2022-10-01: qty 30, 30d supply, fill #2
  Filled 2022-11-01 (×2): qty 30, 30d supply, fill #3

## 2022-03-30 ENCOUNTER — Other Ambulatory Visit (HOSPITAL_COMMUNITY): Payer: Self-pay

## 2022-04-01 ENCOUNTER — Other Ambulatory Visit (HOSPITAL_COMMUNITY): Payer: Self-pay

## 2022-04-04 ENCOUNTER — Other Ambulatory Visit (HOSPITAL_COMMUNITY): Payer: Self-pay

## 2022-04-12 ENCOUNTER — Inpatient Hospital Stay (HOSPITAL_BASED_OUTPATIENT_CLINIC_OR_DEPARTMENT_OTHER): Payer: Medicare Other | Admitting: Oncology

## 2022-04-12 ENCOUNTER — Other Ambulatory Visit: Payer: Self-pay

## 2022-04-12 ENCOUNTER — Inpatient Hospital Stay: Payer: Medicare Other | Attending: Oncology

## 2022-04-12 VITALS — BP 129/76 | HR 66 | Temp 98.1°F | Resp 18 | Ht 72.0 in | Wt 217.6 lb

## 2022-04-12 DIAGNOSIS — C61 Malignant neoplasm of prostate: Secondary | ICD-10-CM

## 2022-04-12 DIAGNOSIS — I10 Essential (primary) hypertension: Secondary | ICD-10-CM | POA: Insufficient documentation

## 2022-04-12 DIAGNOSIS — E878 Other disorders of electrolyte and fluid balance, not elsewhere classified: Secondary | ICD-10-CM | POA: Diagnosis not present

## 2022-04-12 DIAGNOSIS — R7989 Other specified abnormal findings of blood chemistry: Secondary | ICD-10-CM | POA: Diagnosis not present

## 2022-04-12 DIAGNOSIS — Z8546 Personal history of malignant neoplasm of prostate: Secondary | ICD-10-CM | POA: Diagnosis not present

## 2022-04-12 LAB — CMP (CANCER CENTER ONLY)
ALT: 13 U/L (ref 0–44)
AST: 15 U/L (ref 15–41)
Albumin: 3.9 g/dL (ref 3.5–5.0)
Alkaline Phosphatase: 62 U/L (ref 38–126)
Anion gap: 6 (ref 5–15)
BUN: 18 mg/dL (ref 8–23)
CO2: 27 mmol/L (ref 22–32)
Calcium: 9 mg/dL (ref 8.9–10.3)
Chloride: 105 mmol/L (ref 98–111)
Creatinine: 1.13 mg/dL (ref 0.61–1.24)
GFR, Estimated: >60 mL/min (ref >60)
Glucose, Bld: 131 mg/dL — ABNORMAL HIGH (ref 70–99)
Potassium: 4.6 mmol/L (ref 3.5–5.1)
Sodium: 138 mmol/L (ref 135–145)
Total Bilirubin: 0.4 mg/dL (ref 0.3–1.2)
Total Protein: 7.2 g/dL (ref 6.5–8.1)

## 2022-04-12 LAB — CBC WITH DIFFERENTIAL (CANCER CENTER ONLY)
Abs Immature Granulocytes: 0.03 10*3/uL (ref 0.00–0.07)
Basophils Absolute: 0 10*3/uL (ref 0.0–0.1)
Basophils Relative: 1 %
Eosinophils Absolute: 0.3 10*3/uL (ref 0.0–0.5)
Eosinophils Relative: 4 %
HCT: 36.9 % — ABNORMAL LOW (ref 39.0–52.0)
Hemoglobin: 12.2 g/dL — ABNORMAL LOW (ref 13.0–17.0)
Immature Granulocytes: 0 %
Lymphocytes Relative: 13 %
Lymphs Abs: 1 10*3/uL (ref 0.7–4.0)
MCH: 28.4 pg (ref 26.0–34.0)
MCHC: 33.1 g/dL (ref 30.0–36.0)
MCV: 85.8 fL (ref 80.0–100.0)
Monocytes Absolute: 0.6 10*3/uL (ref 0.1–1.0)
Monocytes Relative: 7 %
Neutro Abs: 5.6 10*3/uL (ref 1.7–7.7)
Neutrophils Relative %: 75 %
Platelet Count: 212 10*3/uL (ref 150–400)
RBC: 4.3 MIL/uL (ref 4.22–5.81)
RDW: 15.3 % (ref 11.5–15.5)
WBC Count: 7.6 10*3/uL (ref 4.0–10.5)
nRBC: 0 % (ref 0.0–0.2)

## 2022-04-12 NOTE — Progress Notes (Signed)
Hematology and Oncology Follow Up Visit  [name] [id number] [birth year] 66 y.o. 04/12/2022 2:22 PM [name], DO   Principle Diagnosis: 66 year old man with prostate cancer diagnosed and November 2021.  He presented with Gleason score of 4+5, and PSA of 71.3 and high risk localized disease.   Prior Therapy:  He is status post a prostate biopsy which showed a Gleason score 4+5 = 08 August 2020.  He is status post definitive radiation therapy to the prostate with IMRT and seed implant boost completed in June 2022.  Current therapy:  Androgen deprivation therapy under the care of Dr. Tresa Moore.  He will be receiving that every 6 months to complete 2 years.  Zytiga 1000 mg daily with prednisone started in February 2022 to complete 2 years.    Interim History: [name] is here for return evaluation.  Since last visit, he reports no major changes in his health.  He continues to tolerate Zytiga without any issues.  He denies any nausea, fatigue or abdominal pain.  He denies any bone pain or pathological fractures.     Medications: Reviewed without changes. Current Outpatient Medications  Medication Sig Dispense Refill   abiraterone acetate (ZYTIGA) 250 MG tablet Take 4 tablets (1,000 mg total) by mouth daily. Take on an empty stomach 1 hour before or 2 hours after a meal 120 tablet 0   amLODipine (NORVASC) 10 MG tablet TAKE 1 TABLET BY MOUTH DAILY. 90 tablet 1   atorvastatin (LIPITOR) 40 MG tablet Take 1 tablet  by mouth daily. 90 tablet 3   carvedilol (COREG) 3.125 MG tablet Take 1 tablet by mouth 2 (two) times daily with a meal. 60 tablet 11   empagliflozin (JARDIANCE) 10 MG TABS tablet Take 1 tablet (10 mg total) by mouth daily. (Patient not taking: Reported on 03/08/2022) 90 tablet 3   losartan (COZAAR) 25 MG tablet Take 1 tablet (25 mg total) by mouth at bedtime. 90 tablet 1   nitroGLYCERIN (NITROSTAT) 0.4 MG SL tablet PLACE 1 TABLET UNDER THE  TONGUE EVERY 5 MINUTES AS NEEDED FOR CHEST PAIN. (Patient not taking: Reported on 05/08/2021) 10 tablet 0   predniSONE (DELTASONE) 5 MG tablet Take 1 tablet (5 mg total) by mouth daily with breakfast. 90 tablet 3   selenium sulfide (SELSUN) 2.5 % shampoo Apply 1 application topically daily as needed for irritation 120 mL 12   spironolactone (ALDACTONE) 25 MG tablet Take 0.5 tablets (12.5 mg total) by mouth at bedtime. 90 tablet 0   tamsulosin (FLOMAX) 0.4 MG CAPS capsule Take 1 capsule (0.4 mg total) by mouth daily. 30 capsule 11   triamcinolone cream (KENALOG) 0.5 % APPLY TO THE AFFECTED AREAS DAILY AS NEEDED. 30 g 3   Current Facility-Administered Medications  Medication Dose Route Frequency Provider Last Rate Last Admin   0.9 %  sodium chloride infusion  500 mL Intravenous Once Pyrtle, Lajuan Lines, MD         Allergies: No Known Allergies    Physical Exam:    Blood pressure 129/76, pulse 66, temperature 98.1 F (36.7 C), temperature source Temporal, resp. rate 18, height 6' (1.829 m), weight 217 lb 9.6 oz (98.7 kg), SpO2 96 %.    ECOG:  1   General appearance: Comfortable appearing without any discomfort Head: Normocephalic without any trauma Oropharynx: Mucous membranes are moist and pink without any thrush or ulcers. Eyes: Pupils are equal and round reactive to light. Lymph nodes: No cervical, supraclavicular, inguinal or axillary lymphadenopathy.  Heart:regular rate and rhythm.  S1 and S2 without leg edema. Lung: Clear without any rhonchi or wheezes.  No dullness to percussion. Abdomin: Soft, nontender, nondistended with good bowel sounds.  No hepatosplenomegaly. Musculoskeletal: No joint deformity or effusion.  Full range of motion noted. Neurological: No deficits noted on motor, sensory and deep tendon reflex exam. Skin: No petechial rash or dryness.  Appeared moist.        Lab Results: Lab Results  Component Value Date   WBC 8.5 12/14/2021   HGB 12.0 (L) 12/14/2021    HCT 37.5 (L) 12/14/2021   MCV 85.4 12/14/2021   PLT 254 12/14/2021     Chemistry      Component Value Date/Time   NA 144 02/26/2022 1014   K 4.4 02/26/2022 1014   CL 104 02/26/2022 1014   CO2 25 02/26/2022 1014   BUN 18 02/26/2022 1014   CREATININE 1.05 02/26/2022 1014   CREATININE 1.43 (H) 12/14/2021 1449   CREATININE 1.35 (H) 03/22/2016 1127      Component Value Date/Time   CALCIUM 9.1 02/26/2022 1014   ALKPHOS 63 12/14/2021 1449   AST 16 12/14/2021 1449   ALT 13 12/14/2021 1449   BILITOT 0.6 12/14/2021 1449        Latest Reference Range & Units 08/17/21 13:09 12/14/21 14:49  Prostate Specific Ag, Serum 0.0 - 4.0 ng/mL <0.1 <0.1    Impression and Plan:   66 year old man with:   1.    Prostate cancer diagnosed in November 2021.  Was found to have high risk T1c disease with high PSA and no evidence of metastatic disease with conventional imaging.   His PSA is currently undetectable after definitive treatment with radiation.  He is currently on Zytiga which she has tolerated very well.  Risks and benefits of continuing this treatment for total of 2 years were reviewed.  Complications include hypertension, edema among others were reiterated.  He is agreeable to continue on tentatively treatment for and in February 2024   2.  Androgen deprivation therapy: He continues to receive that under the care of Dr. Tammi Klippel every 6 months.  This will be continued for total of 2 years.   3.  Hypertension: His blood pressure is under control being  4.  Electrolyte imbalance and abnormal liver function test: His potassium and liver function test all within normal range.  We will continue to monitor on Zytiga.   5.  Follow-up: In 4 months for repeat evaluation.     30  minutes were spent on this encounter.  The time was dedicated to reviewing laboratory data, disease status update and addressed complications related to cancer and cancer therapy.       Zola Button,  MD 7/14/20232:22 PM

## 2022-04-13 LAB — PROSTATE-SPECIFIC AG, SERUM (LABCORP): Prostate Specific Ag, Serum: <0.1 ng/mL (ref 0.0–4.0)

## 2022-04-15 ENCOUNTER — Telehealth: Payer: Self-pay | Admitting: *Deleted

## 2022-04-15 NOTE — Telephone Encounter (Signed)
-----   Message from Wyatt Portela, MD sent at 04/15/2022  8:31 AM EDT ----- Please let him know hs PSA is still low

## 2022-04-15 NOTE — Telephone Encounter (Signed)
PC to patient, informed him of Dr. Hazeline Junker message as below, he verbalizes understanding.

## 2022-04-26 ENCOUNTER — Other Ambulatory Visit (HOSPITAL_COMMUNITY): Payer: Self-pay

## 2022-04-29 ENCOUNTER — Other Ambulatory Visit (HOSPITAL_COMMUNITY): Payer: Self-pay

## 2022-04-29 ENCOUNTER — Other Ambulatory Visit: Payer: Self-pay | Admitting: Family Medicine

## 2022-04-29 ENCOUNTER — Other Ambulatory Visit: Payer: Self-pay | Admitting: Oncology

## 2022-04-29 DIAGNOSIS — C61 Malignant neoplasm of prostate: Secondary | ICD-10-CM

## 2022-04-29 MED ORDER — ABIRATERONE ACETATE 250 MG PO TABS
1000.0000 mg | ORAL_TABLET | Freq: Every day | ORAL | 0 refills | Status: DC
  Filled 2022-04-29: qty 120, 30d supply, fill #0

## 2022-04-29 MED ORDER — CARVEDILOL 3.125 MG PO TABS
3.1250 mg | ORAL_TABLET | Freq: Two times a day (BID) | ORAL | 11 refills | Status: DC
  Filled 2022-04-29: qty 60, 30d supply, fill #0
  Filled 2022-05-30: qty 60, 30d supply, fill #1
  Filled 2022-07-01: qty 60, 30d supply, fill #2
  Filled 2022-07-31: qty 60, 30d supply, fill #3
  Filled 2022-09-02: qty 60, 30d supply, fill #4
  Filled 2022-10-07: qty 60, 30d supply, fill #5
  Filled 2022-11-01 (×2): qty 60, 30d supply, fill #6
  Filled 2022-12-06: qty 60, 30d supply, fill #7
  Filled 2023-01-22: qty 60, 30d supply, fill #8
  Filled 2023-03-01: qty 60, 30d supply, fill #9
  Filled 2023-04-17: qty 60, 30d supply, fill #10

## 2022-04-29 MED FILL — Spironolactone Tab 25 MG: ORAL | 90 days supply | Qty: 45 | Fill #1 | Status: AC

## 2022-05-07 ENCOUNTER — Other Ambulatory Visit (HOSPITAL_COMMUNITY): Payer: Self-pay

## 2022-05-13 NOTE — Telephone Encounter (Signed)
Patient calls nurse line stating that he was unable to get denial letter from [facility].   He would like returned phone call from [place] to discuss next steps.   Talbot Grumbling, RN

## 2022-05-17 NOTE — Telephone Encounter (Signed)
Returned pt's phone call regarding L[employer] denial letter.   Pt says he applied for [place] with [employer] over the phone and was denied but never rec'd the letter in the mail. Says he's called about it multiple times and now is told they see no history of him applying. Pt says he will call one more time and attempt to get this. Will follow up with me.

## 2022-05-19 MED FILL — Selenium Sulfide Lotion 2.5%: CUTANEOUS | 30 days supply | Qty: 120 | Fill #4 | Status: AC

## 2022-05-20 ENCOUNTER — Other Ambulatory Visit (HOSPITAL_COMMUNITY): Payer: Self-pay

## 2022-05-30 ENCOUNTER — Other Ambulatory Visit (HOSPITAL_COMMUNITY): Payer: Self-pay

## 2022-05-30 ENCOUNTER — Other Ambulatory Visit: Payer: Self-pay | Admitting: Oncology

## 2022-05-30 DIAGNOSIS — C61 Malignant neoplasm of prostate: Secondary | ICD-10-CM

## 2022-05-30 MED ORDER — ABIRATERONE ACETATE 250 MG PO TABS
1000.0000 mg | ORAL_TABLET | Freq: Every day | ORAL | 0 refills | Status: DC
  Filled 2022-05-30: qty 120, 30d supply, fill #0

## 2022-05-30 MED FILL — Atorvastatin Calcium Tab 40 MG (Base Equivalent): ORAL | 90 days supply | Qty: 90 | Fill #1 | Status: AC

## 2022-05-30 MED FILL — Amlodipine Besylate Tab 10 MG (Base Equivalent): ORAL | 90 days supply | Qty: 90 | Fill #1 | Status: AC

## 2022-05-30 NOTE — Telephone Encounter (Signed)
Patient returns call to nurse line. Reports that he received denial letter in the mail today.   Forwarding to [place] for next steps.   Talbot Grumbling, RN

## 2022-05-30 NOTE — Telephone Encounter (Signed)
Returned call to pt. Pt will bring [employer] application and [employer] denial letter by office tomorrow 05/31/22.

## 2022-06-04 ENCOUNTER — Other Ambulatory Visit (HOSPITAL_COMMUNITY): Payer: Self-pay

## 2022-06-05 ENCOUNTER — Other Ambulatory Visit (HOSPITAL_COMMUNITY): Payer: Self-pay

## 2022-06-11 ENCOUNTER — Telehealth: Payer: Self-pay

## 2022-06-11 NOTE — Telephone Encounter (Signed)
Submitted application for [employer] to [employer]) for patient assistance.   Phone: [phone]

## 2022-06-19 DIAGNOSIS — C61 Malignant neoplasm of prostate: Secondary | ICD-10-CM | POA: Diagnosis not present

## 2022-06-26 ENCOUNTER — Other Ambulatory Visit: Payer: Self-pay | Admitting: Oncology

## 2022-06-26 ENCOUNTER — Other Ambulatory Visit (HOSPITAL_COMMUNITY): Payer: Self-pay

## 2022-06-26 DIAGNOSIS — C61 Malignant neoplasm of prostate: Secondary | ICD-10-CM

## 2022-06-26 MED ORDER — ABIRATERONE ACETATE 250 MG PO TABS
1000.0000 mg | ORAL_TABLET | Freq: Every day | ORAL | 0 refills | Status: DC
  Filled 2022-06-26: qty 120, 30d supply, fill #0

## 2022-06-28 ENCOUNTER — Other Ambulatory Visit (HOSPITAL_COMMUNITY): Payer: Self-pay

## 2022-07-02 ENCOUNTER — Other Ambulatory Visit (HOSPITAL_COMMUNITY): Payer: Self-pay

## 2022-07-04 NOTE — Telephone Encounter (Signed)
Received notification from [employer]) regarding approval for [employer]. Patient assistance approved from 07/04/22 to 09/29/22.  MEDICATION SHIPS TO PATIENTS HOME. SHIPMENT PROCESSING. NO AUTO REFILLS, PT MAY CALL OR VISIT WEBSITE INCLUDED ON FORM IN SHIPMENT.  Phone: [phone]

## 2022-07-05 ENCOUNTER — Telehealth: Payer: Self-pay | Admitting: Pharmacy Technician

## 2022-07-05 ENCOUNTER — Other Ambulatory Visit (HOSPITAL_COMMUNITY): Payer: Self-pay

## 2022-07-05 ENCOUNTER — Telehealth: Payer: Self-pay

## 2022-07-05 ENCOUNTER — Other Ambulatory Visit: Payer: Self-pay | Admitting: Oncology

## 2022-07-05 DIAGNOSIS — C61 Malignant neoplasm of prostate: Secondary | ICD-10-CM

## 2022-07-05 MED ORDER — ENZALUTAMIDE 40 MG PO TABS: 160.0000 mg | ORAL_TABLET | Freq: Every day | ORAL | 2 refills | Status: DC

## 2022-07-05 MED ORDER — ENZALUTAMIDE 40 MG PO TABS
160.0000 mg | ORAL_TABLET | Freq: Every day | ORAL | 2 refills | Status: DC
  Filled 2022-07-05: qty 120, 30d supply, fill #0

## 2022-07-05 NOTE — Telephone Encounter (Signed)
Oral Oncology Pharmacist Encounter  Received new prescription for enzalutamide Gillermina Phy) for the treatment of prostate cancer, planned duration until disease progression or unacceptable toxicity- patient does not have metastatic disease and was going to do 2 years of therapy with zytiga. Patient was to continue until February 2024 .  Labs from 04/12/22 assessed, no interventions needed.  Current medication list in Epic reviewed, DDIs with Xtandi identified: - amlodipine, atorvastatin and losartan: concentration may be decreased by xtandi. Will have patient monitor.   Evaluated chart and no patient barriers to medication adherence noted.   Patient agreement for treatment documented in MD note on 07/05/22. Patient is switching due to high copays of $600 and unable to afford since grant has run out.   Prescription has been e-scribed to the [facility] for benefits analysis and approval.  Oral Oncology Clinic will continue to follow for insurance authorization, copayment issues, initial counseling and start date.  Drema Halon, PharmD Hematology/Oncology Clinical Pharmacist [facility] [phone] 07/05/2022 9:15 AM

## 2022-07-05 NOTE — Telephone Encounter (Signed)
Oral Oncology Patient Advocate Encounter   Received notification that prior authorization for [name] is required.   PA submitted on 07/05/2022 Key [id number] Status is pending     [name], CPhT-Adv Oncology Pharmacy Patient [place] Direct Number: [phone]  Fax: [phone]

## 2022-07-05 NOTE — Telephone Encounter (Signed)
Oral Oncology Patient Advocate Encounter  Completed application for XTANDI in an effort to reduce patient's out of pocket expense to $0.    Application completed online and submitted to [employer].   [employer] patient assistance phone number for follow up is [phone].   This encounter will be updated until final determination.    Jamal Haskin, CPhT-Adv Oncology Pharmacy Patient Advocate [facility] Direct Number: [phone]  Fax: [phone]   

## 2022-07-05 NOTE — Telephone Encounter (Signed)
Oral Oncology Patient Advocate Encounter  Received notification that the request for prior authorization for [name] has been denied due to patient does not have diagnosis of metastatic castration-sensitive prostate cancer.     Lady Deutscher, CPhT-Adv Oncology Pharmacy Patient [place] Direct Number: [phone]  Fax: [phone]

## 2022-07-08 NOTE — Telephone Encounter (Signed)
Oral Oncology Patient Advocate Encounter  Received update via fax from [facility] regarding patient assistance application for Xtandi.  Status is pending proof of insurance appeal denial.   Appeal denial received and submitted to patient application via online portal.  I will continue to check status until final determination.  Lady Deutscher, CPhT-Adv Oncology Pharmacy Patient [place] Direct Number: [phone]  Fax: [phone]

## 2022-07-10 ENCOUNTER — Other Ambulatory Visit (HOSPITAL_COMMUNITY): Payer: Self-pay

## 2022-07-10 ENCOUNTER — Telehealth: Payer: Self-pay

## 2022-07-10 NOTE — Telephone Encounter (Signed)
Received message via secure chat today from Drema Halon, [facility] which states, "Dr. Alen Blew, we have run into a problem with the xtandi program. They will not cover the cost of the medication unless we can get approval of use from the insurance company. The insurance company denied him because he is non-metastatic castration-sensitive. This may be the case for erleada as well... We think we may be able to try Nubeqa if we can go that route." Dr Alen Blew states pt is close enough to 18-24 mo treatment goal that he can stop xtiga. Called to to advise this and he verbalized understanding. He would like to change his appt 11/10 to morning. Message sent to scheduling to accommodate.

## 2022-07-10 NOTE — Telephone Encounter (Signed)
Oral Oncology Patient Advocate Encounter   Received notification that the application for assistance for Xtandi through [facility] has been denied due to patient must have coverage under Medicare D and still find the medication to be unaffordable to qualify for the program.    [employer] number [phone].   Lady Deutscher, CPhT-Adv Oncology Pharmacy Patient [place] Direct Number: [phone]  Fax: [phone]

## 2022-07-10 NOTE — Telephone Encounter (Signed)
Oral Oncology Pharmacist Encounter  Due to denial and denial of appeal, patient assistance cannot be used. Recent program recommendations have changed and require the prior authorization to be approved in order to be approved for assistance.   Patient was denied due to being non-metastatic castration sensitive prostate cancer.   Spoke to Dr. Alen Blew about additional options although we may run into the same problems as above with other programs. Per MD, patient is okay to stop treatment since he is close enough to the goal of 16 to 24 months at this time. Patient has received 20 months of therapy with Zytiga.   Oral oncology clinic will sign off.   Drema Halon, PharmD Hematology/Oncology Clinical Pharmacist [facility] [phone]

## 2022-07-16 ENCOUNTER — Telehealth: Payer: Self-pay | Admitting: *Deleted

## 2022-07-16 NOTE — Telephone Encounter (Signed)
PC to patient, informed him he no longer needs to take his steroid, he verbalizes understanding.

## 2022-07-16 NOTE — Telephone Encounter (Signed)
-----   Message from Boyce Medici, [facility] sent at 07/16/2022  3:40 PM EDT ----- Nolberto Hanlon!  He doesn't need to take the prednisone :)   Thanks, Katie  ----- Message ----- From: Rolene Course, RN Sent: 07/16/2022   3:37 PM EDT To: Boyce Medici, RPH  Hello,  Mr Asbridge called & is asking if he still needs to take dexamethasone since he is no longer taking Zytiga?  I didn't think so but wanted to check for sure.  Thanks, Bethena Roys

## 2022-07-31 ENCOUNTER — Other Ambulatory Visit (HOSPITAL_COMMUNITY): Payer: Self-pay

## 2022-07-31 ENCOUNTER — Other Ambulatory Visit: Payer: Self-pay

## 2022-07-31 MED FILL — Selenium Sulfide Lotion 2.5%: CUTANEOUS | 30 days supply | Qty: 120 | Fill #5 | Status: AC

## 2022-08-01 ENCOUNTER — Telehealth: Payer: Self-pay | Admitting: Oncology

## 2022-08-01 NOTE — Telephone Encounter (Signed)
Called patient regarding upcoming November appointment. Patient notified.

## 2022-08-02 ENCOUNTER — Other Ambulatory Visit (HOSPITAL_COMMUNITY): Payer: Self-pay

## 2022-08-09 ENCOUNTER — Other Ambulatory Visit: Payer: Medicare Other

## 2022-08-09 ENCOUNTER — Ambulatory Visit: Payer: Medicare Other | Admitting: Oncology

## 2022-08-12 ENCOUNTER — Other Ambulatory Visit (HOSPITAL_COMMUNITY): Payer: Self-pay

## 2022-08-20 ENCOUNTER — Inpatient Hospital Stay (HOSPITAL_BASED_OUTPATIENT_CLINIC_OR_DEPARTMENT_OTHER): Payer: Medicare Other | Admitting: Oncology

## 2022-08-20 ENCOUNTER — Inpatient Hospital Stay: Payer: Medicare Other | Attending: Oncology

## 2022-08-20 VITALS — BP 131/72 | HR 64 | Temp 97.6°F | Resp 15 | Wt 220.6 lb

## 2022-08-20 DIAGNOSIS — E878 Other disorders of electrolyte and fluid balance, not elsewhere classified: Secondary | ICD-10-CM | POA: Diagnosis not present

## 2022-08-20 DIAGNOSIS — C61 Malignant neoplasm of prostate: Secondary | ICD-10-CM

## 2022-08-20 DIAGNOSIS — R7989 Other specified abnormal findings of blood chemistry: Secondary | ICD-10-CM | POA: Insufficient documentation

## 2022-08-20 DIAGNOSIS — I10 Essential (primary) hypertension: Secondary | ICD-10-CM | POA: Insufficient documentation

## 2022-08-20 LAB — CBC WITH DIFFERENTIAL (CANCER CENTER ONLY)
Abs Immature Granulocytes: 0.03 10*3/uL (ref 0.00–0.07)
Basophils Absolute: 0 10*3/uL (ref 0.0–0.1)
Basophils Relative: 1 %
Eosinophils Absolute: 0.8 10*3/uL — ABNORMAL HIGH (ref 0.0–0.5)
Eosinophils Relative: 12 %
HCT: 38.8 % — ABNORMAL LOW (ref 39.0–52.0)
Hemoglobin: 12.4 g/dL — ABNORMAL LOW (ref 13.0–17.0)
Immature Granulocytes: 1 %
Lymphocytes Relative: 18 %
Lymphs Abs: 1.2 10*3/uL (ref 0.7–4.0)
MCH: 28.2 pg (ref 26.0–34.0)
MCHC: 32 g/dL (ref 30.0–36.0)
MCV: 88.2 fL (ref 80.0–100.0)
Monocytes Absolute: 0.6 10*3/uL (ref 0.1–1.0)
Monocytes Relative: 10 %
Neutro Abs: 3.8 10*3/uL (ref 1.7–7.7)
Neutrophils Relative %: 58 %
Platelet Count: 223 10*3/uL (ref 150–400)
RBC: 4.4 MIL/uL (ref 4.22–5.81)
RDW: 14 % (ref 11.5–15.5)
WBC Count: 6.5 10*3/uL (ref 4.0–10.5)
nRBC: 0 % (ref 0.0–0.2)

## 2022-08-20 LAB — CMP (CANCER CENTER ONLY)
ALT: 25 U/L (ref 0–44)
AST: 25 U/L (ref 15–41)
Albumin: 4.3 g/dL (ref 3.5–5.0)
Alkaline Phosphatase: 52 U/L (ref 38–126)
Anion gap: 6 (ref 5–15)
BUN: 23 mg/dL (ref 8–23)
CO2: 29 mmol/L (ref 22–32)
Calcium: 9.8 mg/dL (ref 8.9–10.3)
Chloride: 103 mmol/L (ref 98–111)
Creatinine: 1.21 mg/dL (ref 0.61–1.24)
GFR, Estimated: >60 mL/min (ref >60)
Glucose, Bld: 105 mg/dL — ABNORMAL HIGH (ref 70–99)
Potassium: 4.6 mmol/L (ref 3.5–5.1)
Sodium: 138 mmol/L (ref 135–145)
Total Bilirubin: 0.6 mg/dL (ref 0.3–1.2)
Total Protein: 7.4 g/dL (ref 6.5–8.1)

## 2022-08-20 NOTE — Progress Notes (Signed)
Hematology and Oncology Follow Up Visit  [name] [id number] June 21, 1956 66 y.o. 08/20/2022 8:18 AM [name], DO   Principle Diagnosis: 66 year old man with localized, high risk prostate cancer diagnosed and November 2021.  He presented with Gleason score of 4+5, and PSA of 71.3.    Prior Therapy:  He is status post a prostate biopsy which showed a Gleason score 4+5 = 08 August 2020.  He is status post definitive radiation therapy to the prostate with IMRT and seed implant boost completed in June 2022.  Current therapy:  Androgen deprivation therapy under the care of Dr. Tresa Moore.  He will be receiving that every 6 months to complete 2 years.  Zytiga 1000 mg daily with prednisone started in February 2022.  Therapy discontinued in October 2023 after completing course of therapy.    Interim History: [name] returns today for a follow-up visit.  Since the last visit, he reports feeling well without any major complaints.  He denies any residual complications related to [facility] and has discontinued it in the last month.  He denies any urination difficulties or bone pain.  He denies any hospitalizations or illnesses.     Medications: Reviewed on review. Current Outpatient Medications  Medication Sig Dispense Refill   abiraterone acetate (ZYTIGA) 250 MG tablet Take 4 tablets (1,000 mg total) by mouth daily. Take on an empty stomach 1 hour before or 2 hours after a meal 120 tablet 0   amLODipine (NORVASC) 10 MG tablet TAKE 1 TABLET BY MOUTH DAILY. 90 tablet 1   atorvastatin (LIPITOR) 40 MG tablet Take 1 tablet  by mouth daily. 90 tablet 3   carvedilol (COREG) 3.125 MG tablet Take 1 tablet by mouth 2 (two) times daily with a meal. 60 tablet 11   empagliflozin (JARDIANCE) 10 MG TABS tablet Take 1 tablet (10 mg total) by mouth daily. (Patient not taking: Reported on 03/08/2022) 90 tablet 3   enzalutamide (XTANDI) 40 MG tablet Take 4 tablets (160 mg total)  by mouth daily. 120 tablet 2   losartan (COZAAR) 25 MG tablet Take 1 tablet (25 mg total) by mouth at bedtime. 90 tablet 1   nitroGLYCERIN (NITROSTAT) 0.4 MG SL tablet PLACE 1 TABLET UNDER THE TONGUE EVERY 5 MINUTES AS NEEDED FOR CHEST PAIN. (Patient not taking: Reported on 05/08/2021) 10 tablet 0   predniSONE (DELTASONE) 5 MG tablet Take 1 tablet (5 mg total) by mouth daily with breakfast. 90 tablet 3   selenium sulfide (SELSUN) 2.5 % shampoo Apply 1 application topically daily as needed for irritation 120 mL 12   spironolactone (ALDACTONE) 25 MG tablet Take 0.5 tablets (12.5 mg total) by mouth at bedtime. 90 tablet 0   tamsulosin (FLOMAX) 0.4 MG CAPS capsule Take 1 capsule (0.4 mg total) by mouth daily. 30 capsule 11   triamcinolone cream (KENALOG) 0.5 % APPLY TO THE AFFECTED AREAS DAILY AS NEEDED. 30 g 3   Current Facility-Administered Medications  Medication Dose Route Frequency Provider Last Rate Last Admin   0.9 %  sodium chloride infusion  500 mL Intravenous Once Pyrtle, Lajuan Lines, MD         Allergies: No Known Allergies    Physical Exam:   Blood pressure 131/72, pulse 64, temperature 97.6 F (36.4 C), temperature source Temporal, resp. rate 15, weight 220 lb 9.6 oz (100.1 kg), SpO2 95 %.      ECOG:  1    General appearance: Alert, awake without any distress. Head: Atraumatic without abnormalities  Oropharynx: Without any thrush or ulcers. Eyes: No scleral icterus. Lymph nodes: No lymphadenopathy noted in the cervical, supraclavicular, or axillary nodes Heart:regular rate and rhythm, without any murmurs or gallops.   Lung: Clear to auscultation without any rhonchi, wheezes or dullness to percussion. Abdomin: Soft, nontender without any shifting dullness or ascites. Musculoskeletal: No clubbing or cyanosis. Neurological: No motor or sensory deficits. Skin: No rashes or lesions.      Lab Results: Lab Results  Component Value Date   WBC 7.6 04/12/2022   HGB 12.2 (L)  04/12/2022   HCT 36.9 (L) 04/12/2022   MCV 85.8 04/12/2022   PLT 212 04/12/2022     Chemistry      Component Value Date/Time   NA 138 04/12/2022 1420   NA 144 02/26/2022 1014   K 4.6 04/12/2022 1420   CL 105 04/12/2022 1420   CO2 27 04/12/2022 1420   BUN 18 04/12/2022 1420   BUN 18 02/26/2022 1014   CREATININE 1.13 04/12/2022 1420   CREATININE 1.35 (H) 03/22/2016 1127      Component Value Date/Time   CALCIUM 9.0 04/12/2022 1420   ALKPHOS 62 04/12/2022 1420   AST 15 04/12/2022 1420   ALT 13 04/12/2022 1420   BILITOT 0.4 04/12/2022 1420        Latest Reference Range & Units 08/17/21 13:09 12/14/21 14:49  Prostate Specific Ag, Serum 0.0 - 4.0 ng/mL <0.1 <0.1    Impression and Plan:   66 year old man with:   1.    Prostate cancer presented with high risk, T1c disease and a PSA of 71.  He had no evidence of metastatic disease with conventional imaging.   The natural course of his disease was reviewed at this time and treatment choices were discussed.  His PSA continues to be undetectable on the current treatment without any complications.  After discontinuation of Zytiga, will continue active surveillance under the care of [facility].  I recommended follow-up periodically with Dr. Tresa Moore which will include physical examination and a PSA check.   2.  Androgen deprivation therapy: He is currently receiving that under the care of Dr. Tresa Moore.  He will complete 2 years in February 2024.   3.  Hypertension: No issues reported at this time.  4.  Electrolyte imbalance and abnormal liver function test: No issues reported on the last visit.  His potassium and liver function tests all within range.   5.  Follow-up: He will be referred to medical oncology in the future as needed.     30  minutes were dedicated to this visit.  The time was spent on updating disease status, treatment choices and outlining future plan of care review.       Zola Button, MD 11/21/20238:18  AM

## 2022-08-21 ENCOUNTER — Telehealth: Payer: Self-pay | Admitting: *Deleted

## 2022-08-21 LAB — PROSTATE-SPECIFIC AG, SERUM (LABCORP): Prostate Specific Ag, Serum: <0.1 ng/mL (ref 0.0–4.0)

## 2022-08-21 NOTE — Telephone Encounter (Signed)
-----   Message from Tony Portela, MD sent at 08/21/2022  9:08 AM EST ----- Please let him know his PSA is low

## 2022-08-21 NOTE — Telephone Encounter (Signed)
Notified of message below

## 2022-08-30 ENCOUNTER — Telehealth: Payer: Self-pay | Admitting: *Deleted

## 2022-08-30 NOTE — Telephone Encounter (Signed)
Patient called concerned with new symptom since stopping Zytiga.  He reports discomfort on both sides of his abdomen and some into the chest area.  He wanted to know if this should be a concern.  He is aware that his PSA is 0.0 but someone at [facility] told him that [place] can alter his PSA levels so he was worried that the symptoms could be a concern for progression.  Routed to Dr Alen Blew, please advise

## 2022-08-30 NOTE — Telephone Encounter (Signed)
Communicated response to patient.  No further questions.

## 2022-09-02 ENCOUNTER — Other Ambulatory Visit: Payer: Self-pay | Admitting: Family Medicine

## 2022-09-02 ENCOUNTER — Other Ambulatory Visit (HOSPITAL_COMMUNITY): Payer: Self-pay

## 2022-09-02 ENCOUNTER — Other Ambulatory Visit: Payer: Self-pay

## 2022-09-02 DIAGNOSIS — I5022 Chronic systolic (congestive) heart failure: Secondary | ICD-10-CM

## 2022-09-02 MED FILL — Atorvastatin Calcium Tab 40 MG (Base Equivalent): ORAL | 90 days supply | Qty: 90 | Fill #2 | Status: AC

## 2022-09-03 ENCOUNTER — Other Ambulatory Visit (HOSPITAL_COMMUNITY): Payer: Self-pay

## 2022-09-03 MED ORDER — LOSARTAN POTASSIUM 25 MG PO TABS
25.0000 mg | ORAL_TABLET | Freq: Every day | ORAL | 1 refills | Status: DC
  Filled 2022-09-03 – 2022-10-07 (×2): qty 90, 90d supply, fill #0
  Filled 2023-01-03: qty 90, 90d supply, fill #1

## 2022-09-04 ENCOUNTER — Other Ambulatory Visit (HOSPITAL_COMMUNITY): Payer: Self-pay

## 2022-09-04 ENCOUNTER — Other Ambulatory Visit: Payer: Self-pay

## 2022-09-04 DIAGNOSIS — I10 Essential (primary) hypertension: Secondary | ICD-10-CM

## 2022-09-04 MED ORDER — AMLODIPINE BESYLATE 10 MG PO TABS
10.0000 mg | ORAL_TABLET | Freq: Every day | ORAL | 1 refills | Status: DC
  Filled 2022-09-04: qty 90, 90d supply, fill #0
  Filled 2022-12-06: qty 90, 90d supply, fill #1

## 2022-09-05 ENCOUNTER — Other Ambulatory Visit (HOSPITAL_COMMUNITY): Payer: Self-pay

## 2022-09-24 ENCOUNTER — Other Ambulatory Visit: Payer: Self-pay

## 2022-09-25 ENCOUNTER — Other Ambulatory Visit (HOSPITAL_COMMUNITY): Payer: Self-pay

## 2022-09-25 ENCOUNTER — Other Ambulatory Visit: Payer: Self-pay

## 2022-09-25 MED ORDER — SELENIUM SULFIDE 2.5 % EX LOTN
1.0000 | TOPICAL_LOTION | Freq: Every day | CUTANEOUS | 12 refills | Status: DC | PRN
  Filled 2022-09-25: qty 120, 30d supply, fill #0
  Filled 2022-11-01: qty 120, 30d supply, fill #1
  Filled 2022-11-27: qty 120, 30d supply, fill #2
  Filled 2023-01-22: qty 120, 30d supply, fill #3
  Filled 2023-03-29: qty 120, 30d supply, fill #4
  Filled 2023-05-13: qty 120, 30d supply, fill #5
  Filled 2023-07-01: qty 120, 30d supply, fill #6
  Filled 2023-08-06: qty 120, 30d supply, fill #7

## 2022-09-26 ENCOUNTER — Other Ambulatory Visit (HOSPITAL_COMMUNITY): Payer: Self-pay

## 2022-09-27 ENCOUNTER — Ambulatory Visit (INDEPENDENT_AMBULATORY_CARE_PROVIDER_SITE_OTHER): Payer: Medicare Other

## 2022-09-27 DIAGNOSIS — Z23 Encounter for immunization: Secondary | ICD-10-CM

## 2022-10-02 ENCOUNTER — Other Ambulatory Visit (HOSPITAL_COMMUNITY): Payer: Self-pay

## 2022-10-07 ENCOUNTER — Other Ambulatory Visit: Payer: Self-pay

## 2022-10-07 ENCOUNTER — Other Ambulatory Visit (HOSPITAL_COMMUNITY): Payer: Self-pay

## 2022-11-01 ENCOUNTER — Other Ambulatory Visit (HOSPITAL_COMMUNITY): Payer: Self-pay

## 2022-11-01 ENCOUNTER — Other Ambulatory Visit: Payer: Self-pay

## 2022-11-27 ENCOUNTER — Other Ambulatory Visit (HOSPITAL_COMMUNITY): Payer: Self-pay

## 2022-11-27 ENCOUNTER — Other Ambulatory Visit: Payer: Self-pay | Admitting: Family Medicine

## 2022-11-27 DIAGNOSIS — L309 Dermatitis, unspecified: Secondary | ICD-10-CM

## 2022-11-28 ENCOUNTER — Other Ambulatory Visit: Payer: Self-pay

## 2022-11-28 ENCOUNTER — Other Ambulatory Visit (HOSPITAL_COMMUNITY): Payer: Self-pay

## 2022-11-28 MED ORDER — TRIAMCINOLONE ACETONIDE 0.5 % EX CREA
1.0000 | TOPICAL_CREAM | Freq: Every day | CUTANEOUS | 3 refills | Status: DC | PRN
  Filled 2022-11-28: qty 30, 10d supply, fill #0
  Filled 2022-12-23: qty 30, 10d supply, fill #1
  Filled 2023-01-22: qty 30, 10d supply, fill #2
  Filled 2023-03-29: qty 30, 10d supply, fill #3

## 2022-12-02 DIAGNOSIS — C61 Malignant neoplasm of prostate: Secondary | ICD-10-CM | POA: Diagnosis not present

## 2022-12-06 MED FILL — Atorvastatin Calcium Tab 40 MG (Base Equivalent): ORAL | 90 days supply | Qty: 90 | Fill #3 | Status: AC

## 2022-12-09 ENCOUNTER — Other Ambulatory Visit (HOSPITAL_COMMUNITY): Payer: Self-pay

## 2022-12-09 DIAGNOSIS — C61 Malignant neoplasm of prostate: Secondary | ICD-10-CM | POA: Diagnosis not present

## 2022-12-31 ENCOUNTER — Telehealth: Payer: Self-pay | Admitting: Family Medicine

## 2022-12-31 NOTE — Telephone Encounter (Signed)
Contacted [employer] to schedule their annual wellness visit. Appointment made for 01/03/2023.  Thank you,  [place] Direct dial  [phone]

## 2023-01-02 ENCOUNTER — Telehealth: Payer: Self-pay | Admitting: Family Medicine

## 2023-01-02 NOTE — Progress Notes (Signed)
I connected with  Wynelle Link on 01/03/2023  by a audio enabled telemedicine application and verified that I am speaking with the correct person using two identifiers.  Patient Location: Home  Provider Location: Home Office  I discussed the limitations of evaluation and management by telemedicine. The patient expressed understanding and agreed to proceed.   Subjective:   [name] is a 67 y.o. male who presents for Medicare Annual/Subsequent preventive examination.  Review of Systems    Per HPI unless specifically indicated below. Cardiac Risk Factors include: advanced age (>69men, >60 women);male gender          Objective:      08/20/2022    8:25 AM 04/12/2022    2:27 PM 03/08/2022    9:28 AM  Vitals with BMI  Height     Weight 220 lbs 10 oz 217 lbs 10 oz 215 lbs  BMI  29.51 29.15  Systolic 131 129 841  Diastolic 72 76 79  Pulse 64 66 64     Today's Vitals   01/03/23 0912  PainSc: 5    There is no height or weight on file to calculate BMI.     01/03/2023    9:18 AM 03/08/2022    9:28 AM 02/08/2022   10:05 AM 12/18/2021   10:51 AM 10/24/2021    9:07 AM 10/09/2021    3:17 PM 10/02/2021    9:06 AM  Advanced Directives  Does Patient Have a Medical Advance Directive? No No No No No No No  Would patient like information on creating a medical advance directive? No - Patient declined No - Patient declined Yes (MAU/Ambulatory/Procedural Areas - Information given) Yes (MAU/Ambulatory/Procedural Areas - Information given) No - Patient declined No - Patient declined No - Patient declined    Current Medications (verified) Outpatient Encounter Medications as of 01/03/2023  Medication Sig   abiraterone acetate (ZYTIGA) 250 MG tablet Take 4 tablets (1,000 mg total) by mouth daily. Take on an empty stomach 1 hour before or 2 hours after a meal   amLODipine (NORVASC) 10 MG tablet Take 1 tablet (10 mg total) by mouth daily.   atorvastatin (LIPITOR) 40 MG tablet Take  1 tablet  by mouth daily.   carvedilol (COREG) 3.125 MG tablet Take 1 tablet by mouth 2 (two) times daily with a meal.   losartan (COZAAR) 25 MG tablet Take 1 tablet (25 mg total) by mouth at bedtime.   nitroGLYCERIN (NITROSTAT) 0.4 MG SL tablet PLACE 1 TABLET UNDER THE TONGUE EVERY 5 MINUTES AS NEEDED FOR CHEST PAIN.   selenium sulfide (SELSUN) 2.5 % lotion Apply 1 application topically daily as needed for irritation   tamsulosin (FLOMAX) 0.4 MG CAPS capsule Take 1 capsule (0.4 mg total) by mouth daily.   triamcinolone cream (KENALOG) 0.5 % APPLY TO THE AFFECTED AREAS DAILY AS NEEDED.   empagliflozin (JARDIANCE) 10 MG TABS tablet Take 1 tablet (10 mg total) by mouth daily. (Patient not taking: Reported on 01/03/2023)   enzalutamide (XTANDI) 40 MG tablet Take 4 tablets (160 mg total) by mouth daily. (Patient not taking: Reported on 01/03/2023)   predniSONE (DELTASONE) 5 MG tablet Take 1 tablet (5 mg total) by mouth daily with breakfast. (Patient not taking: Reported on 01/03/2023)   spironolactone (ALDACTONE) 25 MG tablet Take 0.5 tablets (12.5 mg total) by mouth at bedtime. (Patient not taking: Reported on 01/03/2023)   [DISCONTINUED] colchicine 0.6 MG tablet On day 1, take 2 tablets, followed by 1  tablet after 1 hour.  On day 2 and thereafter, take 1 tablet daily.   Facility-Administered Encounter Medications as of 01/03/2023  Medication   0.9 %  sodium chloride infusion    Allergies (verified) Patient has no known allergies.   History: Past Medical History:  Diagnosis Date   Alcohol abuse    still drink beer   CHF (congestive heart failure)    Chronic combined systolic and diastolic heart failure, NYHA class 3 06/02/2012   Chronic kidney disease    stage 3 no nephrologist   Chronic systolic heart failure 06/02/2012   Gout    none in years   History of radiation therapy    completed 03-22-2021   Hyperlipidemia    Hypertension    Need for prophylactic vaccination and inoculation against  single disease 04/15/2018   NICM (nonischemic cardiomyopathy) 09/04/2012   Prostate cancer 07/2020   Psoriasis    Possible psoriasis in the past   Pulmonary nodule, right 11/03/2012   Past Surgical History:  Procedure Laterality Date   COLONOSCOPY     CYSTOSCOPY  01/23/2021   Procedure: CYSTOSCOPY FLEXIBLE;  Surgeon: Belva Agee, MD;  Location: [facility];  Service: Urology;;  no seeds found in bladder   KNEE SURGERY Left 1970's   arthroscopic knee out of joint   LEFT AND RIGHT HEART CATHETERIZATION WITH CORONARY ANGIOGRAM N/A 05/25/2012   Procedure: LEFT AND RIGHT HEART CATHETERIZATION WITH CORONARY ANGIOGRAM;  Surgeon: Dolores Patty, MD;  Location: [facility] CATH LAB;  Service: Cardiovascular;  Laterality: N/A;   POLYPECTOMY     RADIOACTIVE SEED IMPLANT N/A 01/23/2021   Procedure: RADIOACTIVE SEED IMPLANT/BRACHYTHERAPY IMPLANT;  Surgeon: Belva Agee, MD;  Location: [facility];  Service: Urology;  Laterality: N/A;   57  seeds implanted   SKIN GRAFT     L arm got caught in machine in 1979   SPACE OAR INSTILLATION N/A 01/23/2021   Procedure: SPACE OAR INSTILLATION;  Surgeon: Belva Agee, MD;  Location: [facility];  Service: Urology;  Laterality: N/A;   Family History  Problem Relation Age of Onset   Stroke Mother    Heart failure Father    Cancer Sister        Skin   Cancer Brother    Prostate cancer Brother    Colon polyps Brother    Colon polyps Brother    Prostate cancer Brother    Breast cancer Daughter    Heart failure Other        Uncle. Possible MI?   Colon cancer Neg Hx    Esophageal cancer Neg Hx    Rectal cancer Neg Hx    Stomach cancer Neg Hx    Social History   Socioeconomic History   Marital status: Legally Separated    Spouse name: Not on file   Number of children: 7   Years of education: 12   Highest education level: 12th grade  Occupational History    Comment: Retired. Formerly disabled  due to heart failure.  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 Planning to move out next week. One of his daughters will be coming to stay with him.       Mobile home with some steps, has handrails. Non-working smoke alarms.    Likes to eat meat, pasta, vegetables, and fruit. Sweet tea is what he likes to drink, V8 juice.      Patient sings in choir.       Social Determinants of Health   Financial Resource Strain: Low Risk  (01/03/2023)   Overall Financial Resource Strain (CARDIA)    Difficulty of Paying Living Expenses: Not hard at all  Food Insecurity: No Food Insecurity (01/03/2023)   Hunger Vital Sign    Worried About Running Out of Food in the Last Year: Never true    Ran Out of Food in the Last Year: Never true  Transportation Needs: No Transportation Needs (01/03/2023)   PRAPARE - Administrator, Civil Service (Medical): No    Lack of Transportation (Non-Medical): No  Physical Activity: Inactive (01/03/2023)   Exercise Vital Sign    Days of Exercise per Week: 0 days    Minutes of Exercise per Session: 0 min  Stress: No Stress Concern Present (01/03/2023)   [employer] of Occupational Health - Occupational Stress Questionnaire    Feeling of Stress : Not at all  Social Connections:  Moderately Integrated (01/03/2023)   Social Connection and Isolation Panel [NHANES]    Frequency of Communication with Friends and Family: Once a week    Frequency of Social Gatherings with Friends and Family: More than three times a week    Attends Religious Services: More than 4 times per year    Active Member of [employer] or Organizations: Yes    Attends Banker Meetings: Never    Marital Status: Separated    Tobacco Counseling Counseling given: No   Clinical Intake:  Pre-visit preparation completed: No  Pain : 0-10 Pain Score: 5  Pain Type: Acute pain Pain Location: Flank Pain Orientation: Right Pain Descriptors / Indicators: Cramping, Squeezing Pain Onset: More than a month ago Pain Frequency: Intermittent     Nutritional Status: BMI > 30  Obese Nutritional Risks: None Diabetes: No  How often do you need to have someone help you when you read instructions, pamphlets, or other written materials from your doctor or pharmacy?: 1 - Never  Diabetic? No  Interpreter Needed?: No  Information entered by :: Balinda Quails   Activities of Daily Living    01/03/2023    9:10 AM  In your present state of health, do you have any difficulty performing the following activities:  Hearing? 0  Vision? 0  Difficulty concentrating or making decisions? 0  Walking or climbing stairs? 1  Dressing or bathing? 0  Doing errands, shopping? 0    Patient Care Team: Sabino Dick, DO as PCP - General (Family Medicine) Sebastian Ache, MD as Consulting Physician (Urology) Margaretmary Dys, MD as Consulting Physician (Radiation Oncology) Belva Agee, MD as Consulting Physician (Urology) Pyrtle, Carie Caddy, MD as Consulting Physician (  Gastroenterology)  Indicate any recent Medical Services you may have received from other than [facility] providers in the past year (date may be approximate).     Assessment:   This is a routine wellness examination for  [facility].   Hearing/Vision screen Denies any hearing issues. Denies any change to her vision. Overdue Annual Eye Exam.   Dietary issues and exercise activities discussed: Current Exercise Habits: The patient does not participate in regular exercise at present, Exercise limited by: cardiac condition(s)   Goals Addressed   None    Depression Screen    01/03/2023    9:10 AM 03/08/2022    9:28 AM 02/08/2022   10:05 AM 12/18/2021   10:48 AM 10/24/2021    9:07 AM 10/09/2021    3:17 PM 10/02/2021    9:06 AM  PHQ 2/9 Scores  PHQ - 2 Score 0 2 2 2 2 2 2   PHQ- 9 Score  3 3 3 3 4 3     Fall Risk    01/03/2023    9:10 AM 03/08/2022    9:29 AM 02/08/2022   10:05 AM 12/18/2021   10:51 AM 10/24/2021    9:08 AM  Fall Risk   Falls in the past year? 0 0 0 0 0  Number falls in past yr: 0  0 0   Injury with Fall? 0  0 0   Risk for fall due to : No Fall Risks   No Fall Risks   Follow up Falls evaluation completed   Falls prevention discussed     FALL RISK PREVENTION PERTAINING TO THE HOME:  Any stairs in or around the home? Yes  If so, are there any without handrails? No  Home free of loose throw rugs in walkways, pet beds, electrical cords, etc? Yes  Adequate lighting in your home to reduce risk of falls? Yes   ASSISTIVE DEVICES UTILIZED TO PREVENT FALLS:  Life alert? No  Use of a cane, walker or w/c? No  Grab bars in the bathroom? No  Shower chair or bench in shower? No  Elevated toilet seat or a handicapped toilet? No   TIMED UP AND GO:  Was the test performed? Unable to perform, virtual appointment   Cognitive Function:    12/11/2018    9:25 AM  MMSE - Mini Mental State Exam  Orientation to time 5  Orientation to Place 5  Registration 3  Attention/ Calculation 5  Recall 3  Language- name 2 objects 2  Language- repeat 1  Language- follow 3 step command 3  Language- read & follow direction 1  Write a sentence 1  Copy design 1  Total score 30        01/03/2023    9:15 AM  12/18/2021   10:52 AM 12/11/2018    9:26 AM  6CIT Screen  What Year?  0 points 0 points  What month? 0 points 0 points 0 points  What time? 0 points 0 points 0 points  Count back from 20 0 points 0 points 0 points  Months in reverse 0 points 0 points 0 points  Repeat phrase 0 points 0 points 0 points  Total Score  0 points 0 points    Immunizations Immunization History  Administered Date(s) Administered   Fluad Quad(high Dose 65+) 08/17/2019, 09/03/2021   Influenza,inj,Quad PF,6+ Mos 07/22/2017, 07/16/2018   PFIZER Comirnaty(Gray Top)Covid-19 Tri-Sucrose Vaccine 09/27/2022   PFIZER(Purple Top)SARS-COV-2 Vaccination 12/20/2019, 01/10/2020   PNEUMOCOCCAL CONJUGATE-20 10/24/2021   Pfizer Covid-19 Vaccine  Bivalent Booster 39yrs & up 09/03/2021   Unspecified SARS-COV-2 Vaccination 07/31/2021    TDAP status: Due, Education has been provided regarding the importance of this vaccine. Advised may receive this vaccine at local pharmacy or Health Dept. Aware to provide a copy of the vaccination record if obtained from local pharmacy or Health Dept. Verbalized acceptance and understanding.  Flu Vaccine status: Up to date  Pneumococcal vaccine status: Up to date  Covid-19 vaccine status: Information provided on how to obtain vaccines.   Qualifies for Shingles Vaccine? Yes   Zostavax completed No   Shingrix Completed?: No.    Education has been provided regarding the importance of this vaccine. Patient has been advised to call insurance company to determine out of pocket expense if they have not yet received this vaccine. Advised may also receive vaccine at local pharmacy or Health Dept. Verbalized acceptance and understanding.  Screening Tests Health Maintenance  Topic Date Due   DTaP/Tdap/Td (1 - Tdap) Never done   Zoster Vaccines- Shingrix (1 of 2) Never done   COVID-19 Vaccine (5 - 2023-24 season) 11/22/2022   INFLUENZA VACCINE  05/01/2023   Medicare Annual Wellness (AWV)  01/03/2024    COLONOSCOPY (Pts 45-30yrs Insurance coverage will need to be confirmed)  08/31/2026   Pneumonia Vaccine 64+ Years old  Completed   Hepatitis C Screening  Completed   HPV VACCINES  Aged Out    Health Maintenance  Health Maintenance Due  Topic Date Due   DTaP/Tdap/Td (1 - Tdap) Never done   Zoster Vaccines- Shingrix (1 of 2) Never done   COVID-19 Vaccine (5 - 2023-24 season) 11/22/2022    Colorectal cancer screening: Type of screening: Colonoscopy. Completed 08/31/2021. Repeat every 5 years  Lung Cancer Screening: (Low Dose CT Chest recommended if Age 68-80 years, 30 pack-year currently smoking OR have quit w/in 15years.) does not qualify.   Lung Cancer Screening Referral: not applicable   Additional Screening:  Hepatitis C Screening: does qualify; Completed 12/08/2015  Vision Screening: Recommended annual ophthalmology exams for early detection of glaucoma and other disorders of the eye. Is the patient up to date with their annual eye exam?  No Who is the provider or what is the name of the office in which the patient attends annual eye exams?  If pt is not established with a provider, would they like to be referred to a provider to establish care? No .   Dental Screening: Recommended annual dental exams for proper oral hygiene  Community Resource Referral / Chronic Care Management: CRR required this visit?  No   CCM required this visit?  No      Plan:     I have personally reviewed and noted the following in the patient's chart:   Medical and social history Use of alcohol, tobacco or illicit drugs  Current medications and supplements including opioid prescriptions. Patient is not currently taking opioid prescriptions. Functional ability and status Nutritional status Physical activity Advanced directives List of other physicians Hospitalizations, surgeries, and ER visits in previous 12 months Vitals Screenings to include cognitive, depression, and  falls Referrals and appointments  In addition, I have reviewed and discussed with patient certain preventive protocols, quality metrics, and best practice recommendations. A written personalized care plan for preventive services as well as general preventive health recommendations were provided to patient.     Lonna Cobb, CMA   01/03/2023  Nurse Notes: Approximately 30 minute Non-Face -To-Face Medicare Wellness Visit   I scheduled the patient  an appointment to follow up on his hypertension and Jardiance refill.

## 2023-01-02 NOTE — Telephone Encounter (Signed)
I left message on patient's voice mail to remind him of AWV phone call on 01/03/2023 at 9:00.

## 2023-01-02 NOTE — Patient Instructions (Signed)
Health Maintenance, Male Adopting a healthy lifestyle and getting preventive care are important in promoting health and wellness. Ask your health care provider about: The right schedule for you to have regular tests and exams. Things you can do on your own to prevent diseases and keep yourself healthy. What should I know about diet, weight, and exercise? Eat a healthy diet  Eat a diet that includes plenty of vegetables, fruits, low-fat dairy products, and lean protein. Do not eat a lot of foods that are high in solid fats, added sugars, or sodium. Maintain a healthy weight Body mass index (BMI) is a measurement that can be used to identify possible weight problems. It estimates body fat based on height and weight. Your health care provider can help determine your BMI and help you achieve or maintain a healthy weight. Get regular exercise Get regular exercise. This is one of the most important things you can do for your health. Most adults should: Exercise for at least 150 minutes each week. The exercise should increase your heart rate and make you sweat (moderate-intensity exercise). Do strengthening exercises at least twice a week. This is in addition to the moderate-intensity exercise. Spend less time sitting. Even light physical activity can be beneficial. Watch cholesterol and blood lipids Have your blood tested for lipids and cholesterol at 69 years of age, then have this test every 5 years. You may need to have your cholesterol levels checked more often if: Your lipid or cholesterol levels are high. You are older than 69 years of age. You are at high risk for heart disease. What should I know about cancer screening? Many types of cancers can be detected early and may often be prevented. Depending on your health history and family history, you may need to have cancer screening at various ages. This may include screening for: Colorectal cancer. Prostate cancer. Skin cancer. Lung  cancer. What should I know about heart disease, diabetes, and high blood pressure? Blood pressure and heart disease High blood pressure causes heart disease and increases the risk of stroke. This is more likely to develop in people who have high blood pressure readings or are overweight. Talk with your health care provider about your target blood pressure readings. Have your blood pressure checked: Every 3-5 years if you are 18-39 years of age. Every year if you are 40 years old or older. If you are between the ages of 65 and 75 and are a current or former smoker, ask your health care provider if you should have a one-time screening for abdominal aortic aneurysm (AAA). Diabetes Have regular diabetes screenings. This checks your fasting blood sugar level. Have the screening done: Once every three years after age 45 if you are at a normal weight and have a low risk for diabetes. More often and at a younger age if you are overweight or have a high risk for diabetes. What should I know about preventing infection? Hepatitis B If you have a higher risk for hepatitis B, you should be screened for this virus. Talk with your health care provider to find out if you are at risk for hepatitis B infection. Hepatitis C Blood testing is recommended for: Everyone born from 1945 through 1965. Anyone with known risk factors for hepatitis C. Sexually transmitted infections (STIs) You should be screened each year for STIs, including gonorrhea and chlamydia, if: You are sexually active and are younger than 69 years of age. You are older than 69 years of age and your   health care provider tells you that you are at risk for this type of infection. Your sexual activity has changed since you were last screened, and you are at increased risk for chlamydia or gonorrhea. Ask your health care provider if you are at risk. Ask your health care provider about whether you are at high risk for HIV. Your health care provider  may recommend a prescription medicine to help prevent HIV infection. If you choose to take medicine to prevent HIV, you should first get tested for HIV. You should then be tested every 3 months for as long as you are taking the medicine. Follow these instructions at home: Alcohol use Do not drink alcohol if your health care provider tells you not to drink. If you drink alcohol: Limit how much you have to 0-2 drinks a day. Know how much alcohol is in your drink. In the [place] one drink equals one 12 oz bottle of beer (355 mL), one 5 oz glass of wine (148 mL), or one 1 oz glass of hard liquor (44 mL). Lifestyle Do not use any products that contain nicotine or tobacco. These products include cigarettes, chewing tobacco, and vaping devices, such as e-cigarettes. If you need help quitting, ask your health care provider. Do not use street drugs. Do not share needles. Ask your health care provider for help if you need support or information about quitting drugs. General instructions Schedule regular health, dental, and eye exams. Stay current with your vaccines. Tell your health care provider if: You often feel depressed. You have ever been abused or do not feel safe at home. Summary Adopting a healthy lifestyle and getting preventive care are important in promoting health and wellness. Follow your health care provider's instructions about healthy diet, exercising, and getting tested or screened for diseases. Follow your health care provider's instructions on monitoring your cholesterol and blood pressure. This information is not intended to replace advice given to you by your health care provider. Make sure you discuss any questions you have with your health care provider. Document Revised: 02/05/2021 Document Reviewed: 02/05/2021 Elsevier Patient Education  2023 Elsevier Inc.  

## 2023-01-03 ENCOUNTER — Ambulatory Visit (INDEPENDENT_AMBULATORY_CARE_PROVIDER_SITE_OTHER): Payer: Medicare Other

## 2023-01-03 DIAGNOSIS — Z0000 Encounter for general adult medical examination without abnormal findings: Secondary | ICD-10-CM | POA: Diagnosis not present

## 2023-01-23 ENCOUNTER — Other Ambulatory Visit: Payer: Self-pay

## 2023-01-23 ENCOUNTER — Other Ambulatory Visit (HOSPITAL_COMMUNITY): Payer: Self-pay

## 2023-02-10 ENCOUNTER — Ambulatory Visit: Payer: Medicare Other | Admitting: Family Medicine

## 2023-02-10 ENCOUNTER — Other Ambulatory Visit (HOSPITAL_COMMUNITY): Payer: Self-pay

## 2023-02-10 MED ORDER — TAMSULOSIN HCL 0.4 MG PO CAPS
0.4000 mg | ORAL_CAPSULE | Freq: Every day | ORAL | 11 refills | Status: DC
  Filled 2023-02-10: qty 30, 30d supply, fill #0
  Filled 2023-03-29: qty 30, 30d supply, fill #1
  Filled 2023-05-09 – 2023-05-13 (×2): qty 30, 30d supply, fill #2
  Filled 2023-07-01: qty 30, 30d supply, fill #3
  Filled 2023-08-06: qty 30, 30d supply, fill #4
  Filled 2023-09-26: qty 30, 30d supply, fill #5
  Filled 2023-10-29: qty 30, 30d supply, fill #6
  Filled 2023-12-02: qty 30, 30d supply, fill #7
  Filled 2024-01-11: qty 30, 30d supply, fill #8

## 2023-02-18 ENCOUNTER — Telehealth: Payer: Self-pay

## 2023-02-18 ENCOUNTER — Ambulatory Visit (INDEPENDENT_AMBULATORY_CARE_PROVIDER_SITE_OTHER): Payer: Medicare Other | Admitting: Family Medicine

## 2023-02-18 ENCOUNTER — Other Ambulatory Visit (HOSPITAL_COMMUNITY): Payer: Self-pay

## 2023-02-18 ENCOUNTER — Other Ambulatory Visit: Payer: Self-pay

## 2023-02-18 VITALS — BP 127/64 | HR 69 | Ht 72.0 in | Wt 229.6 lb

## 2023-02-18 DIAGNOSIS — I10 Essential (primary) hypertension: Secondary | ICD-10-CM

## 2023-02-18 DIAGNOSIS — D649 Anemia, unspecified: Secondary | ICD-10-CM

## 2023-02-18 NOTE — Telephone Encounter (Signed)
Mailed re-enrollment applicaton ([employer] CARES) for jardiance patient assistance.

## 2023-02-18 NOTE — Telephone Encounter (Signed)
-----   Message from Sabino Dick, DO sent at 02/18/2023  9:59 AM EDT ----- Regarding: Assistance with [name],   I just saw this patient in clinic. He states he has been out of his Jardiance for over a month. He states that he wasn't receiving it through a pharmacy but it was being mailed to him and he had to do paperwork for this- so I am assuming he was receiving financial assistance?  He is taking it for his HTN, hx gout and CKD. I was wondering if you could help [place] figure this out so we can get him back on it. I did not refill today since I am not sure what the next step is.  Thank you! Ale

## 2023-02-18 NOTE — Patient Instructions (Signed)
It was wonderful to see you today.  Please bring ALL of your medications with you to every visit.   Today we talked about:  **  Thank you for coming to your visit as scheduled. We have had a large "no-show" problem lately, and this significantly limits our ability to see and care for patients. As a friendly reminder- if you cannot make your appointment please call to cancel. We do have a no show policy for those who do not cancel within 24 hours. Our policy is that if you miss or fail to cancel an appointment within 24 hours, 3 times in a 6-month period, you may be dismissed from our clinic.   Thank you for choosing [facility].   Please call [phone] with any questions about today's appointment.  Please be sure to schedule follow up at the front  desk before you leave today.   Glendora Clouatre, DO PGY-3 Family Medicine   

## 2023-02-18 NOTE — Progress Notes (Signed)
    SUBJECTIVE:   CHIEF COMPLAINT / HPI:   [name] is a 67 y.o. male who presents to the [facility] clinic today to discuss the following concerns:   Attempted med rec but patient is not sure about which medications he is taking.   Chronic HTN Disease Monitoring:  Home BP Monitoring - No Chest pain- no    Dyspnea- no Headache - no  Medications: Per chart- should be on aldactone 25 mg daily, losartan 25 mg daily, Jardiance 10 mg daily (ran out over a month ago), Coreg 3.125 mg daily, amlodipine 10 mg daily. He is not able to confirm all of these medications.  Compliance-  yes   Lightheadedness-  no      Edema- no   Preventitive Healthcare:  Exercise: works in the yard but otherwise no dedicated exercise    Diet Pattern: Mostly eats grapes for fruit. Eats green beans primarily as his vegetable. States he eats some kind of greens about 1-2 times a week, otherwise mostly meat and potatoes   Salt Restriction: No    PERTINENT  PMH / PSH: Prostate cancer s/p radioactive seed implant, CKD III, NICM   OBJECTIVE:   BP 127/64   Pulse 69   Ht 6' (1.829 m)   Wt 229 lb 9.6 oz (104.1 kg)   SpO2 98%   BMI 31.14 kg/m    General: NAD, pleasant, able to participate in exam Cardiac: RRR, no murmurs. Respiratory: CTAB, normal effort, No wheezes, rales or rhonchi Extremities: no edema  Psych: Normal affect and mood  ASSESSMENT/PLAN:   1. Primary hypertension Well controlled.  Unfortunately has been out of Jardiance for over a month at this time.  States that before he was getting mail to him and he had to send something in for this.  I will send a message to [place], our pharmacy tech,  to see if she is able to assist [place] with his medication.  He will likely require some sort of financial assistance for this. SGLT-2 may also help with his gout - Microalbumin/Creatinine Ratio, Urine -Continue his other medications -Encouraged patient to bring in his medications at next visit   2. Normocytic  anemia Last hemoglobin was 12.4 in November.  Likely has anemia of chronic disease. Last colonoscopy in 2022 whowed three 3-5 mm polyps in sigmoid colon, descending colon, and transverse colon and a small lipoma in the ascending colon. He was recommended to return in 5 years (next due 08/2026). No liver abnormalities noted on most recent CMP. No heavy drinking history for the last 2 years but does report that prior to his cancer treatments he was drinking about 5 beers daily. No obvious source of blood loss. Asymptomatic.  - Ferritin - TSH Rfx on Abnormal to Free T4 - Vitamin B12 - Folate   Sabino Dick, DO [facility]

## 2023-02-19 LAB — VITAMIN B12: Vitamin B-12: 633 pg/mL (ref 232–1245)

## 2023-02-19 LAB — MICROALBUMIN / CREATININE URINE RATIO
Creatinine, Urine: 256.2 mg/dL
Microalb/Creat Ratio: 4 mg/g creat (ref 0–29)
Microalbumin, Urine: 10.5 ug/mL

## 2023-02-19 LAB — FERRITIN: Ferritin: 307 ng/mL (ref 30–400)

## 2023-02-19 LAB — TSH RFX ON ABNORMAL TO FREE T4: TSH: 1.71 u[IU]/mL (ref 0.450–4.500)

## 2023-02-19 LAB — FOLATE: Folate: 15.3 ng/mL (ref >3.0)

## 2023-02-20 ENCOUNTER — Encounter: Payer: Self-pay | Admitting: Family Medicine

## 2023-03-04 NOTE — Telephone Encounter (Signed)
Rec'd pt portion back.   Pcp pages with provider, awaiting signature

## 2023-03-12 ENCOUNTER — Other Ambulatory Visit (HOSPITAL_COMMUNITY): Payer: Self-pay

## 2023-03-12 NOTE — Telephone Encounter (Signed)
Submitted application for JARDIANCE to [employer] ([employer]) for patient assistance.   Phone: [phone]  

## 2023-03-13 NOTE — Telephone Encounter (Signed)
Rec'd pending letter from [facility]. Patient may be eligible for Low Income Subsidy. He will need to apply and be denied before approval with patient assistance company.  [employer] LIS: [phone] or apply online.  Called patient. He knows he was denied last year (denial in media). Informed him he would need to be denied for 2024, especially if his income has increased. he would like me to call him back tomorrow when he's back at home.

## 2023-03-21 ENCOUNTER — Other Ambulatory Visit: Payer: Self-pay

## 2023-03-21 ENCOUNTER — Ambulatory Visit (INDEPENDENT_AMBULATORY_CARE_PROVIDER_SITE_OTHER): Payer: Medicare Other | Admitting: Family Medicine

## 2023-03-21 ENCOUNTER — Other Ambulatory Visit (HOSPITAL_COMMUNITY): Payer: Self-pay

## 2023-03-21 VITALS — BP 147/80 | HR 82 | Wt 238.0 lb

## 2023-03-21 DIAGNOSIS — H1013 Acute atopic conjunctivitis, bilateral: Secondary | ICD-10-CM | POA: Diagnosis not present

## 2023-03-21 DIAGNOSIS — H04129 Dry eye syndrome of unspecified lacrimal gland: Secondary | ICD-10-CM | POA: Diagnosis not present

## 2023-03-21 DIAGNOSIS — H539 Unspecified visual disturbance: Secondary | ICD-10-CM

## 2023-03-21 MED ORDER — CYCLOSPORINE 0.05 % OP EMUL
1.0000 [drp] | Freq: Two times a day (BID) | OPHTHALMIC | 1 refills | Status: DC
  Filled 2023-03-21: qty 5.5, 30d supply, fill #0

## 2023-03-21 MED ORDER — AZELASTINE HCL 0.05 % OP SOLN
1.0000 [drp] | Freq: Two times a day (BID) | OPHTHALMIC | 1 refills | Status: AC
  Filled 2023-03-21: qty 6, 30d supply, fill #0
  Filled 2023-07-01: qty 6, 30d supply, fill #1

## 2023-03-21 NOTE — Progress Notes (Signed)
    SUBJECTIVE:   CHIEF COMPLAINT / HPI:   [name] is a 67 y.o. male who presents to the [facility] clinic today to discuss the following concerns:   Eye Irritation Reports that his left eye has been burning since this morning. He has had intermittent burning in both eyes for several years "it comes once or twice a month" but it has never been this bad. He typically just sleeps it off and it passes.   He states that he was mowing the yard and is unsure if dust got in his eyes, though the burning started before this. He put some visine into his eye earlier today but it didn't help.   Also with runny nose.  His vision seems blurred now but denies any double vision.   PERTINENT  PMH / PSH: Hypertension, combined CHF, CKD stage III, history of prostate cancer  OBJECTIVE:   BP (!) 147/80   Pulse 82   Wt 238 lb (108 kg)   SpO2 94%   BMI 32.28 kg/m    General: NAD, pleasant, able to participate in exam HEENT: EOMI, pupils are pinpoint bilaterally, minimally reactive to light, scleral icterus. Conjunctiva is injected bilaterally. Clear drainage from tears but no discharge present.  Respiratory: normal effort Psych: Normal affect and mood  Vision Screening   Right eye Left eye Both eyes  Without correction 20/70 20/100 20/40  With correction       ASSESSMENT/PLAN:   1. Dry eye Chronic intermittent burning sensation of eyes. This morning had flare of left eye, though both eyes appear teary. Not painful. Normal extraocular motion intact. Suspect likely has chronic dry eye that is causing symptoms. Would recommend referral to Ophthalmology for further evaluation but will start with treatment.  - cycloSPORINE (RESTASIS MULTIDOSE) 0.05 % ophthalmic emulsion; Place 1 drop into both eyes 2 (two) times daily.  Dispense: 0.4 mL; Refill: 1 - Ambulatory referral to Ophthalmology  2. Allergic conjunctivitis of both eyes Bilateral conjunctiva erythematous and teary. No discharge present.  Likely contributing to eye irritation as symptoms have been long-standing.  - azelastine (OPTIVAR) 0.05 % ophthalmic solution; Place 1 drop into both eyes 2 (two) times daily.  Dispense: 6 mL; Refill: 1  3. Abnormal vision Failed vision screen today. Likely worsened in the setting of teary eyes.  - Ambulatory referral to Ophthalmology   Sabino Dick, DO [facility]

## 2023-03-21 NOTE — Patient Instructions (Addendum)
It was wonderful to see you today.  Please bring ALL of your medications with you to every visit.   Today we talked about:  I have prescribed two different eye drops. One will treat dry eye (restasis), the other will help with allergies. Use these daily.  I am also sending a referral to ophthalmology for further evaluation.   Thank you for coming to your visit as scheduled. We have had a large "no-show" problem lately, and this significantly limits our ability to see and care for patients. As a friendly reminder- if you cannot make your appointment please call to cancel. We do have a no show policy for those who do not cancel within 24 hours. Our policy is that if you miss or fail to cancel an appointment within 24 hours, 3 times in a 13-month period, you may be dismissed from our clinic.   Thank you for choosing [facility].   Please call [phone] with any questions about today's appointment.  Please be sure to schedule follow up at the front  desk before you leave today.   Sabino Dick, DO PGY-3 Family Medicine

## 2023-03-22 ENCOUNTER — Telehealth: Payer: Self-pay | Admitting: Family Medicine

## 2023-03-22 NOTE — Telephone Encounter (Signed)
Called [facility] and left message to cancel the order for Restasis.  Called patient to check in. He feels that his eyes are slightly better today. Was unable to pick up his prescriptions, reports they wouldn't be ready until Monday. Discussed that I called to cancel one but he can still use the anti-histamine eye drops that were sent over. Also encouraged use of artificial tears OTC until then. He was appreciative of call.

## 2023-03-22 NOTE — Addendum Note (Signed)
Addended by: Sabino Dick on: 03/22/2023 01:38 PM   Modules accepted: Orders

## 2023-03-24 ENCOUNTER — Other Ambulatory Visit (HOSPITAL_COMMUNITY): Payer: Self-pay

## 2023-03-24 ENCOUNTER — Other Ambulatory Visit: Payer: Self-pay

## 2023-03-25 NOTE — Telephone Encounter (Signed)
Pt dropped up denial letter for [employer] Income. Explained to patient this isnt the same as Extra Help.  Pt will call [employer] again to apply for LIS over the telephone. He will get back with me as soon as they have a decision.

## 2023-03-29 ENCOUNTER — Other Ambulatory Visit: Payer: Self-pay

## 2023-03-29 ENCOUNTER — Other Ambulatory Visit: Payer: Self-pay | Admitting: Family Medicine

## 2023-03-29 DIAGNOSIS — I10 Essential (primary) hypertension: Secondary | ICD-10-CM

## 2023-03-31 ENCOUNTER — Other Ambulatory Visit (HOSPITAL_COMMUNITY): Payer: Self-pay

## 2023-03-31 ENCOUNTER — Other Ambulatory Visit: Payer: Self-pay

## 2023-03-31 MED ORDER — AMLODIPINE BESYLATE 10 MG PO TABS
10.0000 mg | ORAL_TABLET | Freq: Every day | ORAL | 1 refills | Status: DC
Start: 2023-03-31 — End: 2023-10-29
  Filled 2023-03-31: qty 90, 90d supply, fill #0
  Filled 2023-07-01: qty 90, 90d supply, fill #1

## 2023-04-01 ENCOUNTER — Other Ambulatory Visit (HOSPITAL_COMMUNITY): Payer: Self-pay

## 2023-04-02 ENCOUNTER — Other Ambulatory Visit: Payer: Self-pay

## 2023-04-02 ENCOUNTER — Other Ambulatory Visit (HOSPITAL_COMMUNITY): Payer: Self-pay

## 2023-04-02 MED ORDER — ATORVASTATIN CALCIUM 40 MG PO TABS
40.0000 mg | ORAL_TABLET | Freq: Every day | ORAL | 3 refills | Status: DC
  Filled 2023-04-02: qty 90, 90d supply, fill #0
  Filled 2023-07-04: qty 90, 90d supply, fill #1
  Filled 2023-10-29: qty 90, 90d supply, fill #2
  Filled 2024-01-28: qty 90, 90d supply, fill #3

## 2023-04-17 ENCOUNTER — Other Ambulatory Visit: Payer: Self-pay | Admitting: Family Medicine

## 2023-04-17 DIAGNOSIS — I5022 Chronic systolic (congestive) heart failure: Secondary | ICD-10-CM

## 2023-04-18 ENCOUNTER — Other Ambulatory Visit (HOSPITAL_COMMUNITY): Payer: Self-pay

## 2023-04-18 MED ORDER — LOSARTAN POTASSIUM 25 MG PO TABS
25.0000 mg | ORAL_TABLET | Freq: Every day | ORAL | 1 refills | Status: DC
Start: 2023-04-18 — End: 2023-11-08
  Filled 2023-04-18: qty 90, 90d supply, fill #0
  Filled 2023-08-06: qty 90, 90d supply, fill #1

## 2023-04-21 ENCOUNTER — Other Ambulatory Visit: Payer: Self-pay

## 2023-04-21 ENCOUNTER — Other Ambulatory Visit (HOSPITAL_COMMUNITY): Payer: Self-pay

## 2023-04-23 ENCOUNTER — Other Ambulatory Visit (HOSPITAL_COMMUNITY): Payer: Self-pay

## 2023-05-01 ENCOUNTER — Other Ambulatory Visit (HOSPITAL_COMMUNITY): Payer: Self-pay

## 2023-05-12 ENCOUNTER — Other Ambulatory Visit (HOSPITAL_COMMUNITY): Payer: Self-pay

## 2023-05-13 ENCOUNTER — Other Ambulatory Visit: Payer: Self-pay | Admitting: Family Medicine

## 2023-05-13 ENCOUNTER — Other Ambulatory Visit: Payer: Self-pay

## 2023-05-13 ENCOUNTER — Other Ambulatory Visit (HOSPITAL_COMMUNITY): Payer: Self-pay

## 2023-05-13 MED ORDER — CARVEDILOL 3.125 MG PO TABS
3.1250 mg | ORAL_TABLET | Freq: Two times a day (BID) | ORAL | 0 refills | Status: DC
  Filled 2023-05-13: qty 60, 30d supply, fill #0

## 2023-05-20 ENCOUNTER — Other Ambulatory Visit (HOSPITAL_COMMUNITY): Payer: Self-pay

## 2023-05-20 ENCOUNTER — Other Ambulatory Visit: Payer: Self-pay | Admitting: Family Medicine

## 2023-05-20 ENCOUNTER — Telehealth: Payer: Self-pay

## 2023-05-20 DIAGNOSIS — L309 Dermatitis, unspecified: Secondary | ICD-10-CM

## 2023-05-20 MED ORDER — TRIAMCINOLONE ACETONIDE 0.5 % EX CREA
1.0000 | TOPICAL_CREAM | Freq: Every day | CUTANEOUS | 3 refills | Status: DC | PRN
Start: 2023-05-20 — End: 2023-12-02
  Filled 2023-05-20: qty 30, 30d supply, fill #0
  Filled 2023-07-01: qty 30, 30d supply, fill #1
  Filled 2023-08-06: qty 30, 10d supply, fill #2
  Filled 2023-10-29: qty 30, 10d supply, fill #3

## 2023-05-20 NOTE — Telephone Encounter (Signed)
Pt calling asking for a refill of his triamcinolone cream. When I tried to send it to the PCP a message came up saying there is already a refill request. I did not see the request. Please send in the cream for the pt.  Sunday Spillers, CMA

## 2023-06-09 DIAGNOSIS — C61 Malignant neoplasm of prostate: Secondary | ICD-10-CM | POA: Diagnosis not present

## 2023-06-30 DIAGNOSIS — C61 Malignant neoplasm of prostate: Secondary | ICD-10-CM | POA: Diagnosis not present

## 2023-06-30 DIAGNOSIS — R351 Nocturia: Secondary | ICD-10-CM | POA: Diagnosis not present

## 2023-06-30 DIAGNOSIS — N401 Enlarged prostate with lower urinary tract symptoms: Secondary | ICD-10-CM | POA: Diagnosis not present

## 2023-07-01 ENCOUNTER — Other Ambulatory Visit (HOSPITAL_COMMUNITY): Payer: Self-pay

## 2023-07-01 ENCOUNTER — Other Ambulatory Visit: Payer: Self-pay

## 2023-07-02 ENCOUNTER — Other Ambulatory Visit: Payer: Self-pay

## 2023-07-03 ENCOUNTER — Telehealth: Payer: Self-pay | Admitting: Family Medicine

## 2023-07-03 ENCOUNTER — Other Ambulatory Visit (HOSPITAL_COMMUNITY): Payer: Self-pay

## 2023-07-03 MED ORDER — CARVEDILOL 3.125 MG PO TABS
3.1250 mg | ORAL_TABLET | Freq: Two times a day (BID) | ORAL | 0 refills | Status: DC
  Filled 2023-07-03: qty 60, 30d supply, fill #0

## 2023-07-03 NOTE — Telephone Encounter (Signed)
  [name] precepted this patient with me. Patient is currently at the pharmacy but does not have refills on his Coreg. Coreg escribed. I advised  to see PCP soon for medication management

## 2023-07-18 ENCOUNTER — Other Ambulatory Visit: Payer: Self-pay

## 2023-07-18 NOTE — Progress Notes (Signed)
Patient attempted to be outreached by Sofie Rower, PharmD to discuss hypertension. Left voicemail for patient to return our call at their convenience at [phone]  Sofie Rower, PharmD [facility] PGY-1

## 2023-07-21 ENCOUNTER — Other Ambulatory Visit (HOSPITAL_COMMUNITY): Payer: Self-pay

## 2023-07-21 ENCOUNTER — Encounter: Payer: Self-pay | Admitting: Family Medicine

## 2023-07-21 ENCOUNTER — Ambulatory Visit (INDEPENDENT_AMBULATORY_CARE_PROVIDER_SITE_OTHER): Payer: Medicare Other | Admitting: Family Medicine

## 2023-07-21 VITALS — BP 131/62 | HR 67 | Ht 72.0 in | Wt 232.8 lb

## 2023-07-21 DIAGNOSIS — Z23 Encounter for immunization: Secondary | ICD-10-CM | POA: Diagnosis not present

## 2023-07-21 DIAGNOSIS — Z0000 Encounter for general adult medical examination without abnormal findings: Secondary | ICD-10-CM | POA: Diagnosis not present

## 2023-07-21 DIAGNOSIS — I10 Essential (primary) hypertension: Secondary | ICD-10-CM | POA: Diagnosis present

## 2023-07-21 DIAGNOSIS — M25511 Pain in right shoulder: Secondary | ICD-10-CM | POA: Diagnosis not present

## 2023-07-21 MED ORDER — TETANUS-DIPHTH-ACELL PERTUSSIS 5-2.5-18.5 LF-MCG/0.5 IM SUSP
0.5000 mL | Freq: Once | INTRAMUSCULAR | 0 refills | Status: AC
Start: 2023-07-21 — End: 2023-07-21

## 2023-07-21 MED ORDER — TETANUS-DIPHTH-ACELL PERTUSSIS 5-2.5-18.5 LF-MCG/0.5 IM SUSP
0.5000 mL | Freq: Once | INTRAMUSCULAR | 0 refills | Status: DC
  Filled 2023-07-21: qty 0.5, 1d supply, fill #0

## 2023-07-21 NOTE — Assessment & Plan Note (Signed)
Right shoulder pain for 2 months.  Has been taking Advil to help with the pain.  Worsens with reaching up and crossing his arms.  Likely AC joint arthritis. - Advised patient to take Tylenol or use Voltaren gel OTC over Advil due to his history of CKD. - Provided options of PT versus right shoulder injections, but patient would like to proceed with conservative management at this time.

## 2023-07-21 NOTE — Patient Instructions (Addendum)
It was great to see you today! Thank you for choosing [facility] for your primary care. [name] was seen for blood pressure checkup.  Today we addressed: Blood Pressure - continue taking your blood pressure medications as prescribed. R shoulder pain - likely arthritis, discussed options of PT vs shoulder injections, can continue treatment now with Tylenol and topical Voltaren gel over Advil due to your history of hypertension, chronic kidney disease, and heart failure.  Vaccines - COVID and Flu shot given today. Tdap and Shingrix can be administered at the pharmacy. We recommend you get both these vaccines.  If you haven't already, sign up for My Chart to have easy access to your labs results, and communication with your primary care physician.  I recommend that you always bring your medications to each appointment as this makes it easy to ensure you are on the correct medications and helps [place] not miss refills when you need them.  You should return to our clinic Return in about 6 months (around 01/19/2024). Please arrive 15 minutes before your appointment to ensure smooth check in process.  We appreciate your efforts in making this happen.  Thank you for allowing me to participate in your care, Fortunato Curling, DO 07/21/2023, 11:19 AM PGY-1, [facility]

## 2023-07-21 NOTE — Progress Notes (Signed)
    SUBJECTIVE:   CHIEF COMPLAINT / HPI: medication management/BP  HTN BP stable today at 131/62. No issues with medications at home. Good compliance with medications. Doesn't check blood pressure at home.   R Shoulder Pain Occurring for the past two months. Worsens with reaching up, but still able to perform his daily activities. Isn't interested in PT or shoulder injections at this time.   PERTINENT  PMH / PSH: HTN, CHF, CKD, HLD  OBJECTIVE:   BP 131/62   Pulse 67   Ht 6' (1.829 m)   Wt 232 lb 12.8 oz (105.6 kg)   SpO2 96%   BMI 31.57 kg/m   General: Awake and Alert in NAD HEENT: Normocephalic, atraumatic. Conjunctiva normal. No nasal discharge Cardiovascular: RRR. No M/R/G Respiratory: CTAB, normal WOB on RA. No wheezing, crackles, rhonchi, or diminished breath sounds. Abdomen: Soft, non-tender, non-distended. Bowel sounds normoactive Extremities: No BLE edema, no deformities or significant joint findings. Skin: Warm and dry. Neuro: A&Ox3. No focal neurological deficits.  ASSESSMENT/PLAN:   Hypertension BP stable today at 131/62 on current regimen. Will continue current regimen. No longer taking Spironolactone, Jardiance, or Nitroglycerin with history of CHF with EF of  55% (last echo in 2019). - Continue current regimen with stable blood pressure  Healthcare maintenance Patient would like his COVID and flu shot today.  Advised patient to get Tdap and Shingrix at his pharmacy.  Patient reports that the Shingrix vaccine was too expensive when prescribed previously. - Ordered Tdap vaccine and was given to patient.  Right shoulder pain Right shoulder pain for 2 months.  Has been taking Advil to help with the pain.  Worsens with reaching up and crossing his arms.  Likely AC joint arthritis. - Advised patient to take Tylenol or use Voltaren gel OTC over Advil due to his history of CKD. - Provided options of PT versus right shoulder injections, but patient would like to  proceed with conservative management at this time.   Fortunato Curling, DO [facility]

## 2023-07-21 NOTE — Assessment & Plan Note (Signed)
Patient would like his COVID and flu shot today.  Advised patient to get Tdap and Shingrix at his pharmacy.  Patient reports that the Shingrix vaccine was too expensive when prescribed previously. - Ordered Tdap vaccine and was given to patient.

## 2023-07-21 NOTE — Assessment & Plan Note (Signed)
BP stable today at 131/62 on current regimen. Will continue current regimen. No longer taking Spironolactone, Jardiance, or Nitroglycerin with history of CHF with EF of  55% (last echo in 2019). - Continue current regimen with stable blood pressure

## 2023-08-06 ENCOUNTER — Other Ambulatory Visit: Payer: Self-pay | Admitting: Family Medicine

## 2023-08-06 ENCOUNTER — Telehealth: Payer: Self-pay | Admitting: Family Medicine

## 2023-08-06 NOTE — Telephone Encounter (Signed)
Called patient to ask if he requires refills for his Losartan due to receiving notice from [facility] pharmacy that he hasn't refilled his medication. Discussed that this has been controlling his blood pressure well and is recommended for his CHF. He reports that he has medication and has been compliant with it, and he will follow up with the pharmacy.  Advised patient to call the office if he has any questions or requires refills.   Fortunato Curling, DO [facility], PGY-1 08/06/23 7:44 PM

## 2023-08-07 ENCOUNTER — Other Ambulatory Visit (HOSPITAL_COMMUNITY): Payer: Self-pay

## 2023-08-07 ENCOUNTER — Other Ambulatory Visit: Payer: Self-pay

## 2023-08-08 ENCOUNTER — Other Ambulatory Visit (HOSPITAL_COMMUNITY): Payer: Self-pay

## 2023-08-08 MED ORDER — CARVEDILOL 3.125 MG PO TABS
3.1250 mg | ORAL_TABLET | Freq: Two times a day (BID) | ORAL | 3 refills | Status: DC
  Filled 2023-08-08 (×2): qty 180, 90d supply, fill #0
  Filled 2023-11-08 – 2023-11-10 (×2): qty 180, 90d supply, fill #1
  Filled 2024-03-01: qty 180, 90d supply, fill #2
  Filled 2024-07-18: qty 180, 90d supply, fill #3

## 2023-10-29 ENCOUNTER — Other Ambulatory Visit: Payer: Self-pay

## 2023-10-29 ENCOUNTER — Other Ambulatory Visit: Payer: Self-pay | Admitting: Family Medicine

## 2023-10-29 DIAGNOSIS — I10 Essential (primary) hypertension: Secondary | ICD-10-CM

## 2023-10-30 ENCOUNTER — Other Ambulatory Visit: Payer: Self-pay

## 2023-10-30 ENCOUNTER — Other Ambulatory Visit (HOSPITAL_COMMUNITY): Payer: Self-pay

## 2023-10-30 MED ORDER — SELENIUM SULFIDE 2.5 % EX LOTN
1.0000 | TOPICAL_LOTION | Freq: Every day | CUTANEOUS | 12 refills | Status: DC | PRN
  Filled 2023-10-30: qty 120, 24d supply, fill #0
  Filled 2023-12-02 (×2): qty 120, 24d supply, fill #1
  Filled 2024-03-01: qty 120, 24d supply, fill #2
  Filled 2024-04-27: qty 120, 24d supply, fill #3
  Filled 2024-07-18: qty 120, 24d supply, fill #4
  Filled 2024-08-29: qty 120, 24d supply, fill #5
  Filled 2024-09-29: qty 120, 24d supply, fill #6

## 2023-10-30 MED ORDER — AMLODIPINE BESYLATE 10 MG PO TABS
10.0000 mg | ORAL_TABLET | Freq: Every day | ORAL | 1 refills | Status: DC
  Filled 2023-10-30: qty 90, 90d supply, fill #0
  Filled 2024-01-28: qty 90, 90d supply, fill #1

## 2023-10-31 ENCOUNTER — Other Ambulatory Visit (HOSPITAL_COMMUNITY): Payer: Self-pay

## 2023-11-03 ENCOUNTER — Other Ambulatory Visit (HOSPITAL_COMMUNITY): Payer: Self-pay

## 2023-11-08 ENCOUNTER — Other Ambulatory Visit: Payer: Self-pay | Admitting: Family Medicine

## 2023-11-08 DIAGNOSIS — I5022 Chronic systolic (congestive) heart failure: Secondary | ICD-10-CM

## 2023-11-10 ENCOUNTER — Ambulatory Visit (INDEPENDENT_AMBULATORY_CARE_PROVIDER_SITE_OTHER): Payer: Medicare Other | Admitting: Family Medicine

## 2023-11-10 ENCOUNTER — Encounter: Payer: Self-pay | Admitting: Family Medicine

## 2023-11-10 ENCOUNTER — Other Ambulatory Visit (HOSPITAL_COMMUNITY): Payer: Self-pay

## 2023-11-10 VITALS — BP 137/60 | HR 73 | Ht 72.0 in | Wt 239.2 lb

## 2023-11-10 DIAGNOSIS — Z7189 Other specified counseling: Secondary | ICD-10-CM

## 2023-11-10 DIAGNOSIS — M25511 Pain in right shoulder: Secondary | ICD-10-CM | POA: Diagnosis present

## 2023-11-10 DIAGNOSIS — G8929 Other chronic pain: Secondary | ICD-10-CM | POA: Diagnosis not present

## 2023-11-10 MED ORDER — LOSARTAN POTASSIUM 25 MG PO TABS
25.0000 mg | ORAL_TABLET | Freq: Every day | ORAL | 1 refills | Status: DC
  Filled 2023-11-10 – 2023-12-02 (×2): qty 90, 90d supply, fill #0
  Filled 2024-03-01: qty 90, 90d supply, fill #1

## 2023-11-10 MED ORDER — DICLOFENAC SODIUM 1 % EX GEL
2.0000 g | Freq: Four times a day (QID) | CUTANEOUS | 1 refills | Status: DC | PRN
  Filled 2023-11-10: qty 100, 12d supply, fill #0

## 2023-11-10 NOTE — Progress Notes (Signed)
   SUBJECTIVE:   CHIEF COMPLAINT / HPI:  Jorah Hua is a 68 y.o. male with a pertinent past medical history of gout, chronic back and knee pain, and HTN presenting to the clinic for right shoulder pain.  Right shoulder pain Has had an aching pain in R shoulder for ~4 months, last seen for this issue at [facility] in October.  Has improved somewhat over this time, but still bothered by the pain. No history of acute R shoulder pain injuries. 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 Pain happens when moving shoulder, sometimes when lying on it for too long. No association with the cold weather or rainy days. Has been using Tiger balm with mild improvement, but not lasting. Stopped taking ibuprofen in October per Dr. Fatima Blank, now taking acetaminophen without much help.  Has not tried Voltaren yet.   PERTINENT PMH / PSH: Formerly worked on tobacco farm, intensive physical labor. Does have history of joint pains and back and knees.   OBJECTIVE:   BP 137/60   Pulse 73   Ht 6' (1.829 m)   Wt 239 lb 3.2 oz (108.5 kg)   SpO2 96%   BMI 32.44 kg/m   General: Appears stated age, resting comfortably in chair, NAD, alert and at baseline. Cardiovascular: Regular rate and rhythm. Normal S1/S2. No murmurs, rubs, or gallops appreciated. 2+ radial pulses. Extremities: No peripheral edema bilaterally. Capillary refill <2 seconds. MSK: Positive Hawkins R shoulder.  Negative empty can test bilaterally.  Equal 5/5 strength in bilateral upper extremities and shoulders.  Range of motion limited by pain with right shoulder external and internal rotation.   ASSESSMENT/PLAN:   Assessment & Plan Chronic right shoulder pain Repeat visit for now 4 months of shoulder pain.  Given history and physical exam, differential includes shoulder impingement, rotator cuff tear/sprain, or osteoarthritis.  Favor impingement, but rotator  cuff injury cannot be ruled out.  Osteoarthritis somewhat less likely given subacute onset and description of pain in HPI, however remains a consideration.  Patient wishes to continue conservative management, but open to injection in 1 month if not improving. -Start multimodal therapy with heat/ice, Voltaren gel, lidocaine patches -Continue to avoid NSAIDs, continue acetaminophen Q6h PRN -Patient continues to be hesitant about PT, provided shoulder impingement exercises from [employer], instructed to use red/yellow/green stoplight system based on pain severity to guide intensity of exercises per AVS -Return if in 1 month not improving for likely steroid shoulder injection, consider XR Advanced directives, counseling/discussion Discussed importance and significance of advanced directive, provided clinic information packet and forms to fill out.  Recommended scheduling follow-up appointment with PCP to review and complete advance directive.  Answered patient questions as appropriate.  Follow-up in 1 month if not improving.  Gaynelle Pastrana Sharion Dove, MD [facility]

## 2023-11-10 NOTE — Patient Instructions (Addendum)
 It was great to see you today! Thank you for choosing [facility] for your primary care.  Today we addressed: There are no diagnoses linked to this encounter. For your shoulder, please start using Voltaren  gel (available over the counter), you can use that multiple time per day on any joints or muscles that are hurting.  You can also try lidocaine  patches on that shoulder.  I am giving you some exercises to do at least once a day, do the ones that help, do not do the ones that hurt a lot.  Remember the red light, yellow light, green light system using your pain as a guide.  If hurting a lot, stop.  If bearable or comfortable, keep going.  Continue to avoid Advil, Tylenol  is fine.  You should return to our clinic in 1 month if not improving for shoulder injection or direct physical therapy referral.  Thank you for coming to see us  at [facility] and for the opportunity to care for you! Yulieth Carrender, MD 11/10/2023, 9:38 AM

## 2023-11-10 NOTE — Assessment & Plan Note (Signed)
 Repeat visit for now 4 months of shoulder pain.  Given history and physical exam, differential includes shoulder impingement, rotator cuff tear/sprain, or osteoarthritis.  Favor impingement, but rotator cuff injury cannot be ruled out.  Osteoarthritis somewhat less likely given subacute onset and description of pain in HPI, however remains a consideration.  Patient wishes to continue conservative management, but open to injection in 1 month if not improving. -Start multimodal therapy with heat/ice, Voltaren  gel, lidocaine  patches -Continue to avoid NSAIDs, continue acetaminophen  Q6h PRN -Patient continues to be hesitant about PT, provided shoulder impingement exercises from [employer], instructed to use red/yellow/green stoplight system based on pain severity to guide intensity of exercises per AVS -Return if in 1 month not improving for likely steroid shoulder injection, consider XR

## 2023-11-21 ENCOUNTER — Other Ambulatory Visit (HOSPITAL_COMMUNITY): Payer: Self-pay

## 2023-12-02 ENCOUNTER — Other Ambulatory Visit: Payer: Self-pay

## 2023-12-02 ENCOUNTER — Other Ambulatory Visit: Payer: Self-pay | Admitting: Family Medicine

## 2023-12-02 ENCOUNTER — Other Ambulatory Visit (HOSPITAL_COMMUNITY): Payer: Self-pay

## 2023-12-02 DIAGNOSIS — L309 Dermatitis, unspecified: Secondary | ICD-10-CM

## 2023-12-02 MED ORDER — TRIAMCINOLONE ACETONIDE 0.5 % EX CREA
1.0000 | TOPICAL_CREAM | Freq: Every day | CUTANEOUS | 3 refills | Status: DC | PRN
  Filled 2023-12-02: qty 30, 30d supply, fill #0
  Filled 2024-03-01: qty 30, 30d supply, fill #1
  Filled 2024-04-27: qty 30, 30d supply, fill #2
  Filled 2024-07-18: qty 30, 30d supply, fill #3

## 2023-12-04 ENCOUNTER — Other Ambulatory Visit (HOSPITAL_COMMUNITY): Payer: Self-pay

## 2023-12-31 DIAGNOSIS — C61 Malignant neoplasm of prostate: Secondary | ICD-10-CM | POA: Diagnosis not present

## 2024-01-07 ENCOUNTER — Other Ambulatory Visit (HOSPITAL_COMMUNITY): Payer: Self-pay

## 2024-01-07 ENCOUNTER — Other Ambulatory Visit: Payer: Self-pay

## 2024-01-07 DIAGNOSIS — N401 Enlarged prostate with lower urinary tract symptoms: Secondary | ICD-10-CM | POA: Diagnosis not present

## 2024-01-07 DIAGNOSIS — Z8546 Personal history of malignant neoplasm of prostate: Secondary | ICD-10-CM | POA: Diagnosis not present

## 2024-01-07 DIAGNOSIS — R351 Nocturia: Secondary | ICD-10-CM | POA: Diagnosis not present

## 2024-01-07 MED ORDER — TAMSULOSIN HCL 0.4 MG PO CAPS
0.4000 mg | ORAL_CAPSULE | Freq: Every day | ORAL | 3 refills | Status: DC
  Filled 2024-01-07: qty 90, 90d supply, fill #0

## 2024-01-12 ENCOUNTER — Other Ambulatory Visit (HOSPITAL_COMMUNITY): Payer: Self-pay

## 2024-02-02 ENCOUNTER — Ambulatory Visit: Payer: Medicare Other

## 2024-02-02 VITALS — Ht 72.0 in | Wt 223.0 lb

## 2024-02-02 DIAGNOSIS — Z0000 Encounter for general adult medical examination without abnormal findings: Secondary | ICD-10-CM | POA: Diagnosis not present

## 2024-02-02 NOTE — Patient Instructions (Signed)
 [name] , Thank you for taking time to come for your Medicare Wellness Visit. I appreciate your ongoing commitment to your health goals. Please review the following plan we discussed and let me know if I can assist you in the future.   Referrals/Orders/Follow-Ups/Clinician Recommendations: Your appointment with Dr. Randeen Busman has been scheduled for 02/05/2024 at 10:45 a.m. to evaluate your abdominal pain.  This is a list of the screening recommended for you and due dates:  Health Maintenance  Topic Date Due   DTaP/Tdap/Td vaccine (1 - Tdap) Never done   Zoster (Shingles) Vaccine (1 of 2) Never done   COVID-19 Vaccine (6 - Pfizer risk 2024-25 season) 01/19/2024   Flu Shot  04/30/2024   Medicare Annual Wellness Visit  02/01/2025   Colon Cancer Screening  08/31/2026   Pneumonia Vaccine  Completed   Hepatitis C Screening  Completed   HPV Vaccine  Aged Out   Meningitis B Vaccine  Aged Out    Advanced directives: (Declined) Advance directive discussed with you today. Even though you declined this today, please call our office should you change your mind, and we can give you the proper paperwork for you to fill out.  Next Medicare Annual Wellness Visit scheduled for next year: Yes, 02/07/2025 at 10:30 a.m. over the PHONE with Nurse Health Advisor.  Have you seen your provider in the last 6 months (3 months if uncontrolled diabetes)? Yes, Patient is scheduled for 02/05/2024 with Dr.Gomes.

## 2024-02-02 NOTE — Progress Notes (Addendum)
   SUBJECTIVE:   CHIEF COMPLAINT / HPI: abdominal pain since December  Abdominal Pain / SOB with activities - ongoing since December, occurs periodically, but resolves with rest - expresses some shortness of breath with activities which is associated with the belly pain - oftentimes pain radiates to his right flank when he doing light activities (folding clothes, singing) - difficulty singing in church choir, has some mild substernal pain and eases up once he stops - denies acid reflux or relation to food - no inciting event or injury that he recalls associated to when this started - denies any N/V/D/C or rectal bleeding - follows with [facility] for metastatic prostate cancer, last PSA wnl  PERTINENT  PMH / PSH: metastatic prostate cancer, HTN, HLD, CHF, CKD OBJECTIVE:   BP 112/62   Pulse 61   Ht 6' (1.829 m)   Wt 226 lb 6.4 oz (102.7 kg)   SpO2 97%   BMI 30.71 kg/m   General: Awake and Alert in NAD HEENT: NCAT. Sclera anicteric. No rhinorrhea. Cardiovascular: RRR. No M/R/G Respiratory: CTAB, normal WOB on RA. No wheezing, crackles, rhonchi, or diminished breath sounds. Abdomen: Soft, mild TTP over LUQ below rib, non-distended. Bowel sounds normoactive. Murphy's sign negative. Extremities: Able to move all extremities. No BLE edema, no deformities or significant joint findings. No CVA tenderness. Skin: Warm and dry. Vitiligo. Neuro: No focal neurological deficits.  ASSESSMENT/PLAN:   Assessment & Plan Dyspnea, unspecified type Nonspecific abdominal pain, sometimes generalized but primarily in the LUQ with radiation to his right flank occasionally.  Describes dyspnea on exertion with mild activities such as walking to his mailbox, singing, and folding clothes. DDx: Angina, MI, pancreatitis, H. pylori/GERD - EKG wnl and stable compared to last EKG - Labs: CBC, CMP, lipase, BNP - 2v CXR ordered to rule out acute pathology - Echo to reassess CHF Generalized abdominal  pain Nonspecific abdominal pain w/ no history of acid reflux.  Pain primarily over stomach/pancreas today.  Denies history of alcohol abuse, occasionally drinks beer. - Complete abdominal US  ordered Chronic systolic congestive heart failure (HCC) History of systolic CHF with normal EF on previous echo, not on medications for HF. - BNP today with hx of DOE - Repeat Echo, last done 5 years ago - Referral to cardiology placed for follow up Anemia, unspecified type Last hemoglobin 12.4, a year ago. - CBC   Clyda Dark, DO [facility]

## 2024-02-02 NOTE — Progress Notes (Signed)
 Because this visit was a virtual/telehealth visit,  certain criteria was not obtained, such a blood pressure, CBG if applicable, and timed get up and go. Any medications not marked as "taking" were not mentioned during the medication reconciliation part of the visit. Any vitals not documented were not able to be obtained due to this being a telehealth visit or patient was unable to self-report a recent blood pressure reading due to a lack of equipment at home via telehealth. Vitals that have been documented are verbally provided by the patient.   Subjective:   [name] is a 68 y.o. who presents for a Medicare Wellness preventive visit.  Visit Complete: Virtual I connected with  [name] on 02/02/24 by a audio enabled telemedicine application and verified that I am speaking with the correct person using two identifiers.  Patient Location: Home  Provider Location: Office/Clinic  I discussed the limitations of evaluation and management by telemedicine. The patient expressed understanding and agreed to proceed.  Vital Signs: Because this visit was a virtual/telehealth visit, some criteria may be missing or patient reported. Any vitals not documented were not able to be obtained and vitals that have been documented are patient reported.  VideoDeclined- This patient declined Librarian, academic. Therefore the visit was completed with audio only.  Persons Participating in Visit: Patient.  AWV Questionnaire: No: Patient Medicare AWV questionnaire was not completed prior to this visit.  Cardiac Risk Factors include: advanced age (>40men, >26 women);dyslipidemia;family history of premature cardiovascular disease;hypertension;male gender;obesity (BMI >30kg/m2);sedentary lifestyle     Objective:    Today's Vitals   02/02/24 1034 02/02/24 1036  Weight: 223 lb (101.2 kg)   Height: 6' (1.829 m)   PainSc: 8  8   PainLoc: Abdomen    Body mass index is 30.24  kg/m.     02/02/2024   10:36 AM 11/10/2023    9:28 AM 07/21/2023   10:31 AM 01/03/2023    9:18 AM 03/08/2022    9:28 AM 02/08/2022   10:05 AM 12/18/2021   10:51 AM  Advanced Directives  Does Patient Have a Medical Advance Directive? No No No No No No No  Would patient like information on creating a medical advance directive? No - Patient declined No - Patient declined No - Patient declined No - Patient declined No - Patient declined Yes (MAU/Ambulatory/Procedural Areas - Information given) Yes (MAU/Ambulatory/Procedural Areas - Information given)    Current Medications (verified) Outpatient Encounter Medications as of 02/02/2024  Medication Sig   amLODipine  (NORVASC ) 10 MG tablet Take 1 tablet (10 mg total) by mouth daily.   atorvastatin  (LIPITOR) 40 MG tablet Take 1 tablet  by mouth daily.   azelastine  (OPTIVAR ) 0.05 % ophthalmic solution Place 1 drop into both eyes 2 (two) times daily.   carvedilol  (COREG ) 3.125 MG tablet Take 1 tablet by mouth 2 (two) times daily with a meal.   diclofenac  Sodium (VOLTAREN ) 1 % GEL Apply 2 g topically 4 (four) times daily as needed (pain).   losartan  (COZAAR ) 25 MG tablet Take 1 tablet (25 mg total) by mouth at bedtime.   selenium  sulfide (SELSUN ) 2.5 % lotion Apply 1 application topically daily as needed for irritation   tamsulosin  (FLOMAX ) 0.4 MG CAPS capsule Take 1 capsule (0.4 mg total) by mouth daily.   tamsulosin  (FLOMAX ) 0.4 MG CAPS capsule Take 1 capsule (0.4 mg total) by mouth daily.   triamcinolone  cream (KENALOG ) 0.5 % Apply 1 Application topically to affected area(s) daily  as needed.   [DISCONTINUED] colchicine  0.6 MG tablet On day 1, take 2 tablets, followed by 1 tablet after 1 hour.  On day 2 and thereafter, take 1 tablet daily.   Facility-Administered Encounter Medications as of 02/02/2024  Medication   0.9 %  sodium chloride  infusion    Allergies (verified) Patient has no known allergies.   History: Past Medical History:  Diagnosis Date    Alcohol abuse    still drink beer   CHF (congestive heart failure) (HCC)    Chronic combined systolic and diastolic heart failure, NYHA class 3 (HCC) 06/02/2012   Chronic kidney disease    stage 3 no nephrologist   Chronic systolic heart failure (HCC) 06/02/2012   Gout    none in years   History of radiation therapy    completed 03-22-2021   Hyperlipidemia    Hypertension    Need for prophylactic vaccination and inoculation against single disease 04/15/2018   NICM (nonischemic cardiomyopathy) (HCC) 09/04/2012   Prostate cancer (HCC) 07/2020   Psoriasis    Possible psoriasis in the past   Pulmonary nodule, right 11/03/2012   Past Surgical History:  Procedure Laterality Date   COLONOSCOPY     CYSTOSCOPY  01/23/2021   Procedure: CYSTOSCOPY FLEXIBLE;  Surgeon: Sherlyn Ditto, MD;  Location: [facility];  Service: Urology;;  no seeds found in bladder   KNEE SURGERY Left 1970's   arthroscopic knee out of joint   LEFT AND RIGHT HEART CATHETERIZATION WITH CORONARY ANGIOGRAM N/A 05/25/2012   Procedure: LEFT AND RIGHT HEART CATHETERIZATION WITH CORONARY ANGIOGRAM;  Surgeon: Mardell Shade, MD;  Location: [facility] CATH LAB;  Service: Cardiovascular;  Laterality: N/A;   POLYPECTOMY     RADIOACTIVE SEED IMPLANT N/A 01/23/2021   Procedure: RADIOACTIVE SEED IMPLANT/BRACHYTHERAPY IMPLANT;  Surgeon: Sherlyn Ditto, MD;  Location: [facility];  Service: Urology;  Laterality: N/A;   57  seeds implanted   SKIN GRAFT     L arm got caught in machine in 1979   SPACE OAR INSTILLATION N/A 01/23/2021   Procedure: SPACE OAR INSTILLATION;  Surgeon: Sherlyn Ditto, MD;  Location: [facility];  Service: Urology;  Laterality: N/A;   Family History  Problem Relation Age of Onset   Stroke Mother    Heart failure Father    Cancer Sister        Skin   Cancer Brother    Prostate cancer Brother    Colon polyps Brother    Colon polyps Brother    Prostate  cancer Brother    Breast cancer Daughter    Heart failure Other        Uncle. Possible MI?   Colon cancer Neg Hx    Esophageal cancer Neg Hx    Rectal cancer Neg Hx    Stomach cancer Neg Hx    Social History   Socioeconomic History   Marital status: Legally Separated    Spouse name: Not on file   Number of children: 7   Years of education: 12   Highest education level: 12th grade  Occupational History    Comment: Retired. Formerly disabled due to heart failure.  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 Planning to move out next week. One of his daughters will be coming to stay with him.       Mobile home with some steps, has handrails. Non-working smoke alarms.    Likes to eat meat, pasta, vegetables, and fruit. Sweet tea is what he likes to drink, V8 juice.      Patient sings in choir.       Social Drivers of Corporate investment banker Strain: Low Risk  (02/02/2024)   Overall Financial Resource Strain (CARDIA)    Difficulty of Paying Living Expenses: Not hard at all  Food Insecurity: No Food Insecurity (02/02/2024)   Hunger Vital Sign    Worried About Running Out of Food in the Last Year: Never true    Ran Out of Food in the Last Year: Never true  Transportation Needs: No Transportation Needs  (02/02/2024)   PRAPARE - Administrator, Civil Service (Medical): No    Lack of Transportation (Non-Medical): No  Physical Activity: Insufficiently Active (02/02/2024)   Exercise Vital Sign    Days of Exercise per Week: 2 days    Minutes of Exercise per Session: 30 min  Stress: No Stress Concern Present (02/02/2024)   [employer] of Occupational Health - Occupational Stress Questionnaire    Feeling of Stress : Not at all  Social Connections: Moderately Integrated (02/02/2024)   Social Connection and Isolation Panel [NHANES]    Frequency of Communication with Friends and Family: Once a week    Frequency of Social Gatherings with Friends and Family: More than three times a week    Attends Religious Services: More than 4 times per year    Active Member of [employer] or Organizations: Yes    Attends Banker Meetings: Never    Marital Status: Separated    Tobacco Counseling Counseling given: Not Answered    Clinical Intake:  Pre-visit preparation completed: Yes  Pain : 0-10 Pain Score: 8  Pain Type: Chronic pain (over 3 months per patient) Pain Location: Abdomen Pain Orientation: Other (Comment) (patient stated:"all over") Pain Onset: More than a month ago Pain Frequency: Intermittent (No pain while sitting, but severe pain with movement.)     BMI - recorded: 30.24 Nutritional Status: BMI > 30  Obese Nutritional Risks: None Diabetes: No  Lab Results  Component Value Date   HGBA1C 5.6 04/09/2016   HGBA1C 5.7 (H) 05/20/2012     How often do you need to have someone help you when you read instructions, pamphlets, or other written materials from your doctor or pharmacy?: 1 - Never What is the last grade level you completed in school?: HSG  Interpreter Needed?: No  Information entered by :: Caleb Decock N. Atari Novick, LPN.   Activities of Daily Living     02/02/2024   10:39 AM  In your present state of health, do you have any difficulty performing the  following activities:  Hearing? 0  Vision? 0  Difficulty concentrating or making decisions? 0  Comment BSE: games on cell phone  Walking or climbing stairs? 0  Dressing or bathing? 0  Doing errands, shopping? 0  Preparing Food and eating ? N  Using the Toilet? N  In the past six months, have you accidently leaked urine? N  Do you have problems with loss of bowel control? N  Managing your Medications? N  Managing your Finances? N  Housekeeping or managing your Housekeeping? N    Patient Care Team: Clyda Dark, DO as PCP - General Secundino Dach Harvey Linen., MD as Consulting Physician (Urology) Kenith Payer, MD as Consulting Physician (Radiation Oncology) Sherlyn Ditto, MD (Inactive) as Consulting Physician (Urology) Pyrtle, Amber Bail, MD as Consulting Physician (Gastroenterology)  Indicate any recent Medical Services you may have received from other than [facility] providers in the past year (date may be approximate).     Assessment:   This is a routine wellness examination for [facility].  Hearing/Vision screen Hearing Screening - Comments:: Denies hearing difficulties.  Vision Screening - Comments:: Wears reading glasses -  not up to date with routine eye exams.    Goals Addressed             This Visit's Progress    To increase my physical activity.         Depression Screen     02/02/2024   10:38 AM 11/10/2023    9:36 AM 07/21/2023   10:30 AM 03/21/2023    2:49 PM 02/18/2023    9:38 AM 01/03/2023    9:10 AM 03/08/2022    9:28 AM  PHQ 2/9 Scores  PHQ - 2 Score 0 2 1 0 1 0 2  PHQ- 9 Score 0 4 2 1 2  3     Fall Risk     02/02/2024   10:37 AM 07/21/2023   10:30 AM 03/21/2023    2:49 PM 02/18/2023    9:38 AM 01/03/2023    9:10 AM  Fall Risk   Falls in the past year? 0 0 0 0 0  Number falls in past yr: 0 0 0 0 0  Injury with Fall? 0 0 0 0 0  Risk for fall due to : No Fall Risks    No Fall Risks  Follow up Falls prevention discussed;Falls evaluation completed    Falls  evaluation completed    MEDICARE RISK AT HOME:  Medicare Risk at Home Any stairs in or around the home?: Yes (front entrance (5 steps)) If so, are there any without handrails?: No Home free of loose throw rugs in walkways, pet beds, electrical cords, etc?: Yes Adequate lighting in your home to reduce risk of falls?: Yes Life alert?: No Use of a cane, walker or w/c?: No Grab bars in the bathroom?: No Shower chair or bench in shower?: No Elevated toilet seat or a handicapped toilet?: No  TIMED UP AND GO:  Was the test performed?  No  Cognitive Function: 6CIT completed    02/02/2024   10:38 AM 12/11/2018    9:25 AM  MMSE - Mini Mental State Exam  Not completed: Unable to complete   Orientation to time  5  Orientation to Place  5  Registration  3  Attention/ Calculation  5  Recall  3  Language- name 2 objects  2  Language- repeat  1  Language- follow 3 step command  3  Language- read & follow direction  1  Write a sentence  1  Copy design  1  Total score  30        02/02/2024   10:38 AM 01/03/2023    9:15 AM 12/18/2021   10:52 AM 12/11/2018    9:26 AM  6CIT Screen  What Year? 0 points  0 points 0 points  What month? 0 points 0 points 0 points 0 points  What time? 0 points 0 points 0 points 0 points  Count back from 20 0 points 0 points 0 points 0 points  Months in reverse 0 points 0 points 0 points 0 points  Repeat phrase 0 points 0 points 0 points 0 points  Total Score 0 points  0 points 0 points    Immunizations Immunization History  Administered Date(s) Administered   Fluad Quad(high Dose 65+) 08/17/2019, 09/03/2021   Fluad Trivalent(High Dose 65+) 07/21/2023   Influenza,inj,Quad PF,6+ Mos 07/22/2017, 07/16/2018   PFIZER Comirnaty (Gray Top)Covid-19 Tri-Sucrose Vaccine 09/27/2022   PFIZER(Purple Top)SARS-COV-2 Vaccination 12/20/2019, 01/10/2020   PNEUMOCOCCAL CONJUGATE-20 10/24/2021   Pfizer Covid-19 Vaccine Bivalent Booster 9yrs & up 09/03/2021    Pfizer(Comirnaty )Fall Seasonal Vaccine 12 years and older 07/21/2023   Unspecified SARS-COV-2 Vaccination 07/31/2021    Screening Tests Health Maintenance  Topic Date Due   DTaP/Tdap/Td (1 - Tdap) Never done   Zoster Vaccines- Shingrix  (1 of 2) Never done   COVID-19 Vaccine (6 - Pfizer risk 2024-25 season) 01/19/2024   INFLUENZA VACCINE  04/30/2024   Medicare Annual Wellness (AWV)  02/01/2025   Colonoscopy  08/31/2026   Pneumonia Vaccine 49+ Years old  Completed   Hepatitis C Screening  Completed   HPV VACCINES  Aged Out   Meningococcal B Vaccine  Aged Out    Health Maintenance  Health Maintenance Due  Topic Date Due   DTaP/Tdap/Td (1 - Tdap) Never done   Zoster Vaccines- Shingrix  (1 of 2) Never done   COVID-19 Vaccine (6 - Pfizer risk 2024-25 season) 01/19/2024   Health Maintenance Items Addressed: Yes Patient is due for the following: Covid-19, Dtap and Shingrix  vaccines.  Additional Screening:  Vision Screening: Recommended annual ophthalmology exams for early detection of glaucoma and other disorders of the eye.  Dental Screening: Recommended annual dental exams for proper oral hygiene  Community Resource Referral / Chronic Care Management: CRR required this visit?  No   CCM required this visit?  No     Plan:     I have personally reviewed and noted the following in the patient's chart:   Medical and social history Use of alcohol, tobacco or illicit drugs  Current medications and supplements including opioid prescriptions. Patient is not currently taking opioid prescriptions. Functional ability and status Nutritional status Physical activity Advanced directives List of other physicians Hospitalizations, surgeries, and ER visits in previous 12 months Vitals Screenings to include cognitive, depression, and falls Referrals and appointments  In addition, I have reviewed and discussed with patient certain preventive protocols, quality metrics, and best  practice recommendations. A written personalized care plan for preventive services as well as general preventive health recommendations were provided to patient.     Margette Sheldon, LPN   05/01/9561   After Visit Summary: (Declined) Due to this being a telephonic visit, with patients personalized plan was offered to patient but patient Declined AVS at this time   Nurse Notes: Patient is due for the following: Covid-19, Dtap and Shingrix  vaccines.

## 2024-02-03 ENCOUNTER — Other Ambulatory Visit (HOSPITAL_COMMUNITY): Payer: Self-pay

## 2024-02-05 ENCOUNTER — Other Ambulatory Visit: Payer: Self-pay

## 2024-02-05 ENCOUNTER — Ambulatory Visit (INDEPENDENT_AMBULATORY_CARE_PROVIDER_SITE_OTHER): Admitting: Family Medicine

## 2024-02-05 VITALS — BP 112/62 | HR 61 | Ht 72.0 in | Wt 226.4 lb

## 2024-02-05 DIAGNOSIS — R1084 Generalized abdominal pain: Secondary | ICD-10-CM

## 2024-02-05 DIAGNOSIS — D649 Anemia, unspecified: Secondary | ICD-10-CM

## 2024-02-05 DIAGNOSIS — I5022 Chronic systolic (congestive) heart failure: Secondary | ICD-10-CM | POA: Diagnosis not present

## 2024-02-05 DIAGNOSIS — R0600 Dyspnea, unspecified: Secondary | ICD-10-CM

## 2024-02-05 NOTE — Patient Instructions (Addendum)
 It was great to see you today! Thank you for choosing [facility] for your primary care. [name] was seen for abdominal pain.  Today we addressed: Abdominal Pain w/ shortness of breath with exertion -we are going to do a thorough workup for your symptoms including EKG, chest x-ray, abdominal ultrasound, and echocardiogram.  We are also ordering some labs to check your blood count, electrolytes, kidney function, liver function, heart and pancreas. Your symptoms are sounding like stable angina since your symptoms occur with activity but improved with rest.  However if your symptoms ever become persistent even at rest you should go to the hospital. Your EKG looks stable from your last EKG without acute concern.  We are checking some labs today. If they are abnormal, I will call you. If they are normal, I will send you a MyChart message (if it is active) or a letter in the mail. If you do not hear about your labs in the next 2 weeks, please call the office.  You should return to our clinic Return in about 2-3 weeks (around 02/19/2024) for review labs/symptoms. Please arrive 15 minutes before your appointment to ensure smooth check in process.  We appreciate your efforts in making this happen.  Thank you for allowing me to participate in your care, Clyda Dark, DO 02/05/2024, 11:53 AM PGY-1, [facility]

## 2024-02-08 ENCOUNTER — Encounter: Payer: Self-pay | Admitting: Family Medicine

## 2024-02-08 LAB — CBC WITH DIFFERENTIAL/PLATELET
Basophils Absolute: 0 10*3/uL (ref 0.0–0.2)
Basos: 0 %
EOS (ABSOLUTE): 0.3 10*3/uL (ref 0.0–0.4)
Eos: 5 %
Hematocrit: 40.2 % (ref 37.5–51.0)
Hemoglobin: 12.8 g/dL — ABNORMAL LOW (ref 13.0–17.7)
Immature Grans (Abs): 0 10*3/uL (ref 0.0–0.1)
Immature Granulocytes: 1 %
Lymphocytes Absolute: 1.4 10*3/uL (ref 0.7–3.1)
Lymphs: 22 %
MCH: 27.5 pg (ref 26.6–33.0)
MCHC: 31.8 g/dL (ref 31.5–35.7)
MCV: 86 fL (ref 79–97)
Monocytes Absolute: 0.6 10*3/uL (ref 0.1–0.9)
Monocytes: 10 %
Neutrophils Absolute: 3.8 10*3/uL (ref 1.4–7.0)
Neutrophils: 62 %
Platelets: 211 10*3/uL (ref 150–450)
RBC: 4.66 x10E6/uL (ref 4.14–5.80)
RDW: 14.8 % (ref 11.6–15.4)
WBC: 6.2 10*3/uL (ref 3.4–10.8)

## 2024-02-08 LAB — COMPREHENSIVE METABOLIC PANEL WITH GFR
ALT: 16 [IU]/L (ref 0–44)
AST: 21 [IU]/L (ref 0–40)
Albumin: 4.3 g/dL (ref 3.9–4.9)
Alkaline Phosphatase: 86 [IU]/L (ref 44–121)
BUN/Creatinine Ratio: 15 (ref 10–24)
BUN: 14 mg/dL (ref 8–27)
Bilirubin Total: 0.7 mg/dL (ref 0.0–1.2)
CO2: 24 mmol/L (ref 20–29)
Calcium: 8.8 mg/dL (ref 8.6–10.2)
Chloride: 102 mmol/L (ref 96–106)
Creatinine, Ser: 0.91 mg/dL (ref 0.76–1.27)
Globulin, Total: 3.1 g/dL (ref 1.5–4.5)
Glucose: 88 mg/dL (ref 70–99)
Potassium: 4.7 mmol/L (ref 3.5–5.2)
Sodium: 140 mmol/L (ref 134–144)
Total Protein: 7.4 g/dL (ref 6.0–8.5)
eGFR: 91 mL/min/{1.73_m2}

## 2024-02-08 LAB — LIPASE: Lipase: 28 U/L (ref 13–78)

## 2024-02-08 LAB — BRAIN NATRIURETIC PEPTIDE: BNP: 78.7 pg/mL (ref 0.0–100.0)

## 2024-02-11 ENCOUNTER — Ambulatory Visit
Admission: RE | Admit: 2024-02-11 | Discharge: 2024-02-11 | Disposition: A | Discharge status: Home / Self Care | Source: Ambulatory Visit | Attending: Family Medicine | Admitting: Family Medicine

## 2024-02-11 ENCOUNTER — Ambulatory Visit: Payer: Self-pay | Admitting: Family Medicine

## 2024-02-11 DIAGNOSIS — R1084 Generalized abdominal pain: Secondary | ICD-10-CM | POA: Diagnosis not present

## 2024-02-11 DIAGNOSIS — K760 Fatty (change of) liver, not elsewhere classified: Secondary | ICD-10-CM | POA: Diagnosis not present

## 2024-02-11 DIAGNOSIS — K8020 Calculus of gallbladder without cholecystitis without obstruction: Secondary | ICD-10-CM | POA: Diagnosis not present

## 2024-02-25 NOTE — Progress Notes (Unsigned)
   SUBJECTIVE:   CHIEF COMPLAINT / HPI:   Dyspnea follow up / Lab review - US  revealed gallstones and fatty liver disease; patient endorses some midsternal and lateral right-sided pain occasionally - Labs wnl except mild but stable anemia - SOB w/ exertion is still about the same, but improves with rest - Feels vague pain mid-sternally and in his abdomen at times  PERTINENT  PMH / PSH: metastatic prostate cancer, HTN, HLD, CHF, CKD   OBJECTIVE:   BP 126/68   Pulse (!) 57   Ht 6' (1.829 m)   Wt 221 lb 6.4 oz (100.4 kg)   SpO2 100%   BMI 30.03 kg/m   General: Awake and Alert in NAD HEENT: NCAT. Sclera anicteric. No rhinorrhea. Cardiovascular: RRR. No M/R/G Respiratory: CTAB, normal WOB on RA. No wheezing, crackles, rhonchi, or diminished breath sounds. Abdomen: Soft, non-tender, non-distended. Bowel sounds normoactive Extremities: Able to move all extremities. No BLE edema, no deformities or significant joint findings. Skin: Warm and dry. Vitiligo. Neuro: A&Ox3. No focal neurological deficits.  ASSESSMENT/PLAN:   Assessment & Plan Dyspnea, unspecified type Extensive workup performed due to his nonspecific abdominal/chest pain.  EKG performed at last visit which was wnl, labs were stable and wnl except for mild anemia.  Abdominal US  revealed gallstones and fatty liver disease.  Patient has a history of smoking, but does not qualify for low-dose CT chest.  No complaints of radiating chest pain or flank pain at this time.  There is concern for angina due to DOE with minimal activities. - Echo and CXR pending - Referral to cardiology placed at last visit, but patient did not receive a call/appointment.  Another referral was placed.  Provided office information for him to call. - Advised dietary improvement to slow the progression of fatty liver disease and prevent gallstone pain. - ED precautions provided for persistent chest/abdominal pain that does not improve with rest Secondary  hypertension BP elevated today at the beginning of his visit, but improved upon repeat. - Continue amlodipine , losartan , and carvedilol  as prescribed.   Clyda Dark, DO [facility]

## 2024-02-26 ENCOUNTER — Ambulatory Visit
Admission: RE | Admit: 2024-02-26 | Discharge: 2024-02-26 | Disposition: A | Discharge status: Home / Self Care | Source: Ambulatory Visit | Attending: Family Medicine

## 2024-02-26 ENCOUNTER — Ambulatory Visit: Admitting: Family Medicine

## 2024-02-26 VITALS — BP 126/68 | HR 57 | Ht 72.0 in | Wt 221.4 lb

## 2024-02-26 DIAGNOSIS — I159 Secondary hypertension, unspecified: Secondary | ICD-10-CM | POA: Diagnosis not present

## 2024-02-26 DIAGNOSIS — R1084 Generalized abdominal pain: Secondary | ICD-10-CM

## 2024-02-26 DIAGNOSIS — R0600 Dyspnea, unspecified: Secondary | ICD-10-CM | POA: Diagnosis not present

## 2024-02-26 DIAGNOSIS — R079 Chest pain, unspecified: Secondary | ICD-10-CM | POA: Diagnosis not present

## 2024-02-26 NOTE — Assessment & Plan Note (Signed)
 BP elevated today at the beginning of his visit, but improved upon repeat. - Continue amlodipine , losartan , and carvedilol  as prescribed.

## 2024-02-26 NOTE — Patient Instructions (Addendum)
 It was great to see you today! Thank you for choosing [facility] for your primary care. [name] was seen for dyspnea and reviewing lab results.  Today we addressed: Labs - normal with mild anemia Ultrasound of belly - gallstones in gallbladder and fatty liver disease. I would advise you to improve your dietary intake to include mainly vegetables, lean protein ([place], chicken), and carbohydrates (rice, pasta) in moderation to help slow the progression of fatty liver disease, but also help avoid any pain from the gallstones.  Please make sure your ultrasound of your heart and xray of your chest get done.  Referral placed to cardiology. Please take all your medications to your appt.  [facility] [address] [place], [place] [address] [phone]  5.    ED precautions: If the pain or shortness of breath continues and doesn't improve for ~10-20 minutes please go to the ED  If you haven't already, sign up for My Chart to have easy access to your labs results, and communication with your primary care physician.  I recommend that you always bring your medications to each appointment as this makes it easy to ensure you are on the correct medications and helps us  not miss refills when you need them.  You should return to our clinic No follow-ups on file. Please arrive 15 minutes before your appointment to ensure smooth check in process.  We appreciate your efforts in making this happen.  Thank you for allowing me to participate in your care, Clyda Dark, DO 02/26/2024, 10:56 AM PGY-1, [facility]

## 2024-02-27 ENCOUNTER — Other Ambulatory Visit: Payer: Self-pay | Admitting: Family Medicine

## 2024-02-27 DIAGNOSIS — R918 Other nonspecific abnormal finding of lung field: Secondary | ICD-10-CM

## 2024-02-27 NOTE — Progress Notes (Signed)
 Pulm nodule noted in right upper lobe on chest x-ray.  CT recommended for follow-up evaluation.  Attempted to call patient to relay the result and left a voicemail to have him call back.  Order placed for CT chest with contrast with history of his metastatic prostate cancer.  If patient calls back today will try to speak with him while in clinic, if not will try calling again on Monday.  If patient calls back and I'm unavailable, please relay to the patient that a questionable nodule was found on his x-ray which the radiologist recommended assessing with a CT of his chest for further evaluation.  Message routed to [facility] team to help schedule CT.  Clyda Dark, DO 02/27/24 2:33 PM

## 2024-03-01 ENCOUNTER — Other Ambulatory Visit: Payer: Self-pay

## 2024-03-01 ENCOUNTER — Other Ambulatory Visit (HOSPITAL_COMMUNITY): Payer: Self-pay

## 2024-03-01 NOTE — Telephone Encounter (Signed)
 Called [name] to relay chest x-ray results and the findings of a pulmonary nodule.  Recommended that we pursue the CT chest with contrast to investigate this further.  Patient is agreeable.  Awaiting prior auth to schedule CT chest per nursing team.  Patient has been trying to call [facility], confirmed that he is calling the right number.  He states that he has not received a call back and has not been able to speak with anyone regarding scheduling an appointment.  Would appreciate the assistance if Clovis Dar or our CMAs to see if we can get an appointment scheduled for him urgently. Thank you!  Odaliz Mcqueary, DO 03/01/24 1:22 PM

## 2024-03-16 ENCOUNTER — Ambulatory Visit (HOSPITAL_BASED_OUTPATIENT_CLINIC_OR_DEPARTMENT_OTHER)
Admission: RE | Admit: 2024-03-16 | Discharge: 2024-03-16 | Disposition: A | Discharge status: Home / Self Care | Source: Ambulatory Visit | Attending: Internal Medicine | Admitting: Internal Medicine

## 2024-03-16 DIAGNOSIS — I5022 Chronic systolic (congestive) heart failure: Secondary | ICD-10-CM | POA: Diagnosis not present

## 2024-03-16 DIAGNOSIS — R0600 Dyspnea, unspecified: Secondary | ICD-10-CM | POA: Insufficient documentation

## 2024-03-16 DIAGNOSIS — R0609 Other forms of dyspnea: Secondary | ICD-10-CM | POA: Diagnosis not present

## 2024-03-17 LAB — ECHOCARDIOGRAM COMPLETE
AR max vel: 2.59 cm2
AV Area VTI: 2.38 cm2
AV Area mean vel: 2.52 cm2
AV Mean grad: 3 mmHg
AV Peak grad: 5.9 mmHg
Ao pk vel: 1.21 m/s
Area-P 1/2: 5.54 cm2
Calc EF: 60.6 %
MV M vel: 2.56 m/s
MV Peak grad: 26.2 mmHg
S' Lateral: 3 cm
Single Plane A2C EF: 59.2 %
Single Plane A4C EF: 60.3 %

## 2024-03-18 ENCOUNTER — Ambulatory Visit
Admission: RE | Admit: 2024-03-18 | Discharge: 2024-03-18 | Disposition: A | Discharge status: Home / Self Care | Source: Ambulatory Visit | Attending: Family Medicine | Admitting: Family Medicine

## 2024-03-18 DIAGNOSIS — R918 Other nonspecific abnormal finding of lung field: Secondary | ICD-10-CM | POA: Diagnosis not present

## 2024-03-18 DIAGNOSIS — J9811 Atelectasis: Secondary | ICD-10-CM | POA: Diagnosis not present

## 2024-03-18 DIAGNOSIS — I2510 Atherosclerotic heart disease of native coronary artery without angina pectoris: Secondary | ICD-10-CM | POA: Diagnosis not present

## 2024-03-18 MED ORDER — IOPAMIDOL (ISOVUE-300) INJECTION 61%
70.0000 mL | Freq: Once | INTRAVENOUS | Status: AC | PRN
  Administered 2024-03-18: 70 mL via INTRAVENOUS

## 2024-03-22 ENCOUNTER — Ambulatory Visit: Payer: Self-pay | Admitting: Family Medicine

## 2024-04-27 ENCOUNTER — Other Ambulatory Visit: Payer: Self-pay

## 2024-04-27 ENCOUNTER — Other Ambulatory Visit: Payer: Self-pay | Admitting: Family Medicine

## 2024-04-27 ENCOUNTER — Encounter: Payer: Self-pay | Admitting: Cardiology

## 2024-04-27 ENCOUNTER — Ambulatory Visit: Attending: Cardiology | Admitting: Cardiology

## 2024-04-27 ENCOUNTER — Other Ambulatory Visit (HOSPITAL_COMMUNITY): Payer: Self-pay

## 2024-04-27 VITALS — BP 158/76 | HR 58 | Ht 71.75 in | Wt 224.0 lb

## 2024-04-27 DIAGNOSIS — I5042 Chronic combined systolic (congestive) and diastolic (congestive) heart failure: Secondary | ICD-10-CM | POA: Diagnosis not present

## 2024-04-27 DIAGNOSIS — R072 Precordial pain: Secondary | ICD-10-CM

## 2024-04-27 DIAGNOSIS — I5022 Chronic systolic (congestive) heart failure: Secondary | ICD-10-CM

## 2024-04-27 DIAGNOSIS — R0600 Dyspnea, unspecified: Secondary | ICD-10-CM | POA: Insufficient documentation

## 2024-04-27 DIAGNOSIS — E782 Mixed hyperlipidemia: Secondary | ICD-10-CM | POA: Diagnosis not present

## 2024-04-27 DIAGNOSIS — I428 Other cardiomyopathies: Secondary | ICD-10-CM | POA: Diagnosis not present

## 2024-04-27 MED ORDER — ASPIRIN 81 MG PO TBEC: 81.0000 mg | DELAYED_RELEASE_TABLET | Freq: Every day | ORAL | Status: AC

## 2024-04-27 MED ORDER — TAMSULOSIN HCL 0.4 MG PO CAPS
0.4000 mg | ORAL_CAPSULE | Freq: Every day | ORAL | 11 refills | Status: AC
  Filled 2024-04-27: qty 30, 30d supply, fill #0
  Filled 2024-06-09: qty 30, 30d supply, fill #1
  Filled 2024-07-18: qty 30, 30d supply, fill #2
  Filled 2024-08-29: qty 30, 30d supply, fill #3
  Filled 2024-11-03: qty 30, 30d supply, fill #4

## 2024-04-27 MED ORDER — ISOSORBIDE MONONITRATE ER 30 MG PO TB24
30.0000 mg | ORAL_TABLET | Freq: Every day | ORAL | 3 refills | Status: DC
  Filled 2024-04-27: qty 90, 90d supply, fill #0

## 2024-04-27 MED ORDER — LOSARTAN POTASSIUM 25 MG PO TABS
25.0000 mg | ORAL_TABLET | Freq: Every day | ORAL | 3 refills | Status: DC
  Filled 2024-04-27 – 2024-08-29 (×2): qty 90, 90d supply, fill #0

## 2024-04-27 MED ORDER — NITROGLYCERIN 0.4 MG SL SUBL
0.4000 mg | SUBLINGUAL_TABLET | SUBLINGUAL | 6 refills | Status: AC | PRN
  Filled 2024-04-27: qty 25, 9d supply, fill #0

## 2024-04-27 NOTE — Patient Instructions (Addendum)
 Medication Instructions:    START: Aspirin  81mg  1 tablet daily  START: Imdur  30mg  1 tablet   START: Nitroglycerin : Use nitroglycerin  1 tablet placed under the tongue at the first sign of chest pain or an angina attack. 1 tablet may be used every 5 minutes as needed, for up to 15 minutes. Do not take more than 3 tablets in 15 minutes. If pain persist call 911 or go to the nearest ED.      Lab Work: 3rd Floor   [address]  Your physician recommends that you return for lab work in:  when fasting  You need to have labs done when you are fasting.  You can come Monday through Friday 8:00 am to 11:30AM and 1:00 to 4:00. You do not need to make an appointment as the order has already been placed. The labs you are going to have done are  Lipid panel   Testing/Procedures:  Your cardiac CT will be scheduled at one of the below locations:   [facility] [address] [place], [place] [address]  Please follow these instructions carefully (unless otherwise directed):  Hold all erectile dysfunction medications at least 3 days (72 hrs) prior to test.  On the Night Before the Test: Be sure to Drink plenty of water. Do not consume any caffeinated/decaffeinated beverages or chocolate 12 hours prior to your test. Do not take any antihistamines 12 hours prior to your test.  On the Day of the Test: Drink plenty of water until 1 hour prior to the test. Do not eat any food 4 hours prior to the test. You may take your regular medications prior to the test.         After the Test: Drink plenty of water. After receiving IV contrast, you may experience a mild flushed feeling. This is normal. On occasion, you may experience a mild rash up to 24 hours after the test. This is not dangerous. If this occurs, you can take Benadryl 25 mg and increase your fluid intake. If you experience trouble breathing, this can be serious. If it is severe call 911 IMMEDIATELY. If it is mild, please call our  office. If you take any of these medications: Glipizide/Metformin, Avandament, Glucavance, please do not take 48 hours after completing test unless otherwise instructed.  We will call to schedule your test 2-4 weeks out understanding that some insurance companies will need an authorization prior to the service being performed.   For non-scheduling related questions, please contact the cardiac imaging nurse navigator should you have any questions/concerns: Camie Shutter, Cardiac Imaging Nurse Navigator Chantal Requena, Cardiac Imaging Nurse Navigator [facility] Direct Office Dial: [phone]   For scheduling needs, including cancellations and rescheduling, please call [place], [phone].    Follow-Up: At [facility], you and your health needs are our priority.  As part of our continuing mission to provide you with exceptional heart care, we have created designated Provider Care Teams.  These Care Teams include your primary Cardiologist (physician) and Advanced Practice Providers (APPs -  Physician Assistants and Nurse Practitioners) who all work together to provide you with the care you need, when you need it.  We recommend signing up for the patient portal called MyChart.  Sign up information is provided on this After Visit Summary.  MyChart is used to connect with patients for Virtual Visits (Telemedicine).  Patients are able to view lab/test results, encounter notes, upcoming appointments, etc.  Non-urgent messages can be  sent to your provider as well.   To learn more about what you can do with MyChart, go to [email or web address].    Your next appointment:   2 month(s)  The format for your next appointment:   In Person  Provider:   Lamar Fitch, MD    Other Instructions NA

## 2024-04-27 NOTE — Progress Notes (Unsigned)
 Cardiology Consultation:    Date:  04/27/2024   ID:  [name], DOB [birth year], MRN [id number]  PCP:  Janna Ferrier, DO  Cardiologist:  Lamar Fitch, MD   Referring MD: Rosalynn Camie CROME, MD   Chief Complaint  Patient presents with   Chest Pain    History of Present Illness:    [name] is a 68 y.o. male who is being seen today for the evaluation of chest pain at the request of Rosalynn Camie CROME, MD. past medical history significant for congestive heart failure.  Apparently in 2012 2013 he was diagnosed with this condition however he does not know more details about that.  Tissue problem include dyslipidemia, prostate cancer with metastasis, hypertension.  He was referred to us  because of episode of chest pain.  He said pain could be pressure tightness squeezing in the chest that happening when he denies effort.  Relieved by rest, has been going for last few months but lately became worse.  Sometimes twisting his body make pain  worse as well.  He also complained of having some dyspnea on exertion, recent echocardiogram showed preserved left ventricle ejection fraction basically no explanation for his dyspnea.  He did have CT of his chest recently which showed calcification of the coronary arteries some pulmonary nodules but otherwise again no explanation for his symptomatology remaining chest pain and shortness of breath.  He does have hypertension, does not smoke, does have family history of coronary disease but not premature, he is not on any special diet.  He does have history of prostate cancer, Gleason score was 4+5 total of 9 that was November 2021 s/p radiation therapy of prostate with androgen deprivation therapy.  Past Medical History:  Diagnosis Date   Alcohol abuse    still drink beer   CHF (congestive heart failure) (HCC)    Chronic combined systolic and diastolic heart failure, NYHA class 3 (HCC) 06/02/2012   Chronic kidney disease    stage 3 no nephrologist   Chronic  systolic heart failure (HCC) 06/02/2012   Gout    none in years   History of radiation therapy    completed 03-22-2021   Hyperlipidemia    Hypertension    Need for prophylactic vaccination and inoculation against single disease 04/15/2018   NICM (nonischemic cardiomyopathy) (HCC) 09/04/2012   Prostate cancer (HCC) 07/2020   Psoriasis    Possible psoriasis in the past   Pulmonary nodule, right 11/03/2012    Past Surgical History:  Procedure Laterality Date   COLONOSCOPY     CYSTOSCOPY  01/23/2021   Procedure: CYSTOSCOPY FLEXIBLE;  Surgeon: Rosalind Zachary NOVAK, MD;  Location: [facility];  Service: Urology;;  no seeds found in bladder   KNEE SURGERY Left 1970's   arthroscopic knee out of joint   LEFT AND RIGHT HEART CATHETERIZATION WITH CORONARY ANGIOGRAM N/A 05/25/2012   Procedure: LEFT AND RIGHT HEART CATHETERIZATION WITH CORONARY ANGIOGRAM;  Surgeon: Toribio JONELLE Fuel, MD;  Location: [facility] CATH LAB;  Service: Cardiovascular;  Laterality: N/A;   POLYPECTOMY     RADIOACTIVE SEED IMPLANT N/A 01/23/2021   Procedure: RADIOACTIVE SEED IMPLANT/BRACHYTHERAPY IMPLANT;  Surgeon: Rosalind Zachary NOVAK, MD;  Location: [facility];  Service: Urology;  Laterality: N/A;   57  seeds implanted   SKIN GRAFT     L arm got caught in machine in 1979   SPACE OAR INSTILLATION N/A 01/23/2021   Procedure: SPACE OAR INSTILLATION;  Surgeon: Rosalind Zachary NOVAK, MD;  Location: [facility];  Service: Urology;  Laterality: N/A;    Current Medications: Current Meds  Medication Sig   amLODipine  (NORVASC ) 10 MG tablet Take 1 tablet (10 mg total) by mouth daily.   atorvastatin  (LIPITOR) 40 MG tablet Take 1 tablet  by mouth daily.   azelastine  (OPTIVAR ) 0.05 % ophthalmic solution Place 1 drop into both eyes 2 (two) times daily.   carvedilol  (COREG ) 3.125 MG tablet Take 1 tablet by mouth 2 (two) times daily with a meal.   diclofenac  Sodium (VOLTAREN ) 1 % GEL Apply 2 g topically  4 (four) times daily as needed (pain).   losartan  (COZAAR ) 25 MG tablet Take 1 tablet (25 mg total) by mouth at bedtime.   selenium  sulfide (SELSUN ) 2.5 % lotion Apply 1 application topically daily as needed for irritation   tamsulosin  (FLOMAX ) 0.4 MG CAPS capsule Take 1 capsule (0.4 mg total) by mouth daily.   triamcinolone  cream (KENALOG ) 0.5 % Apply 1 Application topically to affected area(s) daily as needed.   [DISCONTINUED] tamsulosin  (FLOMAX ) 0.4 MG CAPS capsule Take 1 capsule (0.4 mg total) by mouth daily.   Current Facility-Administered Medications for the 04/27/24 encounter (Office Visit) with Leonell Lobdell J, MD  Medication   0.9 %  sodium chloride  infusion     Allergies:   Patient has no known allergies.   Social History   Socioeconomic History   Marital status: Legally Separated    Spouse name: Not on file   Number of children: 7   Years of education: 12   Highest education level: 12th grade  Occupational History    Comment: Retired. Formerly disabled due to heart failure.  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 Planning to move out next week. One of his daughters will be coming to stay with him.       Mobile home with some steps, has handrails. Non-working smoke alarms.    Likes to  eat meat, pasta, vegetables, and fruit. Sweet tea is what he likes to drink, V8 juice.      Patient sings in choir.       Social Drivers of Corporate investment banker Strain: Low Risk  (02/02/2024)   Overall Financial Resource Strain (CARDIA)    Difficulty of Paying Living Expenses: Not hard at all  Food Insecurity: No Food Insecurity (02/02/2024)   Hunger Vital Sign    Worried About Running Out of Food in the Last Year: Never true    Ran Out of Food in the Last Year: Never true  Transportation Needs: No Transportation Needs (02/02/2024)   PRAPARE - Administrator, Civil Service (  Medical): No    Lack of Transportation (Non-Medical): No  Physical Activity: Insufficiently Active (02/02/2024)   Exercise Vital Sign    Days of Exercise per Week: 2 days    Minutes of Exercise per Session: 30 min  Stress: No Stress Concern Present (02/02/2024)   [employer] of Occupational Health - Occupational Stress Questionnaire    Feeling of Stress : Not at all  Social Connections: Moderately Integrated (02/02/2024)   Social Connection and Isolation Panel    Frequency of Communication with Friends and Family: Once a week    Frequency of Social Gatherings with Friends and Family: More than three times a week    Attends Religious Services: More than 4 times per year    Active Member of [employer] or Organizations: Yes    Attends Banker Meetings: Never    Marital Status: Separated     Family History: The patient's family history includes Breast cancer in his daughter; Cancer in his brother and sister; Colon polyps in his brother and brother; Heart failure in his father and another family member; Prostate cancer in his brother and brother; Stroke in his mother. There is no history of Colon cancer, Esophageal cancer, Rectal cancer, or Stomach cancer. ROS:   Please see the history of present illness.    All 14 point review of systems negative except as described per history of present  illness.  EKGs/Labs/Other Studies Reviewed:    The following studies were reviewed today:   EKG:  EKG Interpretation Date/Time:  Tuesday April 27 2024 10:45:06 EDT Ventricular Rate:  61 PR Interval:  232 QRS Duration:  100 QT Interval:  450 QTC Calculation: 453 R Axis:   9  Text Interpretation: Sinus rhythm with 1st degree A-V block Minimal voltage criteria for LVH, may be normal variant ( R in aVL ) Septal infarct , age undetermined When compared with ECG of 05-Feb-2024 11:23, No significant change was found Confirmed by Bernie Charleston [phone]) on 04/27/2024 10:51:26 AM    Recent Labs: 02/05/2024: ALT 16; BNP 78.7; BUN 14; Creatinine, Ser 0.91; Hemoglobin 12.8; Platelets 211; Potassium 4.7; Sodium 140  Recent Lipid Panel    Component Value Date/Time   CHOL 123 09/03/2021 1123   TRIG 65 09/03/2021 1123   HDL 57 09/03/2021 1123   CHOLHDL 2.2 09/03/2021 1123   CHOLHDL 6.0 04/10/2016 0356   VLDL 23 04/10/2016 0356   LDLCALC 52 09/03/2021 1123    Physical Exam:    VS:  BP (!) 158/76 (BP Location: Right Arm, Patient Position: Sitting)   Pulse (!) 58   Ht 5' 11.75 (1.822 m)   Wt 224 lb (101.6 kg)   SpO2 93%   BMI 30.59 kg/m     Wt Readings from Last 3 Encounters:  04/27/24 224 lb (101.6 kg)  02/26/24 221 lb 6.4 oz (100.4 kg)  02/05/24 226 lb 6.4 oz (102.7 kg)     GEN:  Well nourished, well developed in no acute distress HEENT: Normal NECK: No JVD; No carotid bruits LYMPHATICS: No lymphadenopathy CARDIAC: RRR, no murmurs, no rubs, no gallops RESPIRATORY:  Clear to auscultation without rales, wheezing or rhonchi  ABDOMEN: Soft, non-tender, non-distended MUSCULOSKELETAL:  No edema; No deformity  SKIN: Warm and dry NEUROLOGIC:  Alert and oriented x 3 PSYCHIATRIC:  Normal affect   ASSESSMENT:    1. Dyspnea, unspecified type   2. Chronic combined systolic and diastolic heart failure, NYHA class 3 (HCC)   3. NICM (nonischemic cardiomyopathy) (HCC)  4. Mixed  hyperlipidemia   5. Precordial chest pain    PLAN:    In order of problems listed above:  Chest pain very suspicion for coronary artery disease.  He does have calcification of the coronary arteries on chest CT.  I had a long discussion with him about what to do with the situation he agreed to proceed with coronary CT angio.  Will schedule him to have the test done.  In the meantime I will ask him to start taking 1 baby aspirin  every single day, will give him prescription for nitroglycerin  as needed, I will give him prescription for Imdur  30 mg daily.  I gave him instruction about taking nitroglycerin  and told him to go to the emergency room if nitroglycerin  does not relieve the pain. Dyspnea on exertion probably multifactorial so far no cardiac explanation.  Will wait for coronary CT angio.  Negative feeling he probably gets some significant coronary disease but we favor coronary CT angio first. Mixed dyslipidemia I did review his lab work test he is taking Lipitor 40 LDL 52 HDL 57 this is from 2022, excellent control then but will have to have cholesterol rechecked   Medication Adjustments/Labs and Tests Ordered: Current medicines are reviewed at length with the patient today.  Concerns regarding medicines are outlined above.  Orders Placed This Encounter  Procedures   EKG 12-Lead   No orders of the defined types were placed in this encounter.   Signed, Lamar DOROTHA Fitch, MD, [facility]. 04/27/2024 11:04 AM    [facility]

## 2024-04-28 LAB — LIPID PANEL
Chol/HDL Ratio: 2.1 ratio (ref 0.0–5.0)
Cholesterol, Total: 147 mg/dL (ref 100–199)
HDL: 69 mg/dL (ref >39)
LDL Chol Calc (NIH): 68 mg/dL (ref 0–99)
Triglycerides: 43 mg/dL (ref 0–149)
VLDL Cholesterol Cal: 10 mg/dL (ref 5–40)

## 2024-04-30 ENCOUNTER — Ambulatory Visit: Payer: Self-pay | Admitting: Cardiology

## 2024-04-30 ENCOUNTER — Encounter (HOSPITAL_COMMUNITY): Payer: Self-pay

## 2024-04-30 ENCOUNTER — Other Ambulatory Visit (HOSPITAL_COMMUNITY): Payer: Self-pay

## 2024-05-03 ENCOUNTER — Telehealth (HOSPITAL_COMMUNITY): Payer: Self-pay | Admitting: Emergency Medicine

## 2024-05-03 NOTE — Telephone Encounter (Signed)
 Unable to leave vm Rockwell Alexandria RN Navigator Cardiac Imaging [facility] [phone] Office  [phone] Cell

## 2024-05-04 ENCOUNTER — Ambulatory Visit (HOSPITAL_BASED_OUTPATIENT_CLINIC_OR_DEPARTMENT_OTHER)
Admission: RE | Admit: 2024-05-04 | Discharge: 2024-05-04 | Disposition: A | Discharge status: Home / Self Care | Source: Ambulatory Visit | Attending: Cardiology | Admitting: Cardiology

## 2024-05-04 ENCOUNTER — Other Ambulatory Visit: Payer: Self-pay | Admitting: Cardiology

## 2024-05-04 ENCOUNTER — Encounter (HOSPITAL_BASED_OUTPATIENT_CLINIC_OR_DEPARTMENT_OTHER): Payer: Self-pay

## 2024-05-04 DIAGNOSIS — R072 Precordial pain: Secondary | ICD-10-CM | POA: Diagnosis not present

## 2024-05-04 DIAGNOSIS — R931 Abnormal findings on diagnostic imaging of heart and coronary circulation: Secondary | ICD-10-CM | POA: Diagnosis not present

## 2024-05-04 LAB — POCT I-STAT CREATININE: Creatinine, Ser: 1.2 mg/dL (ref 0.61–1.24)

## 2024-05-04 MED ORDER — NITROGLYCERIN 0.4 MG SL SUBL
0.8000 mg | SUBLINGUAL_TABLET | Freq: Once | SUBLINGUAL | Status: AC
  Administered 2024-05-04: 0.8 mg via SUBLINGUAL

## 2024-05-04 MED ORDER — IOHEXOL 350 MG/ML SOLN
100.0000 mL | Freq: Once | INTRAVENOUS | Status: AC | PRN
  Administered 2024-05-04: 95 mL via INTRAVENOUS

## 2024-05-10 ENCOUNTER — Telehealth: Payer: Self-pay

## 2024-05-10 NOTE — Telephone Encounter (Signed)
 Lab Results reviewed with pt as per Dr. Karry note.  Pt verbalized understanding and had no additional questions. Routed to PCP

## 2024-05-11 ENCOUNTER — Ambulatory Visit: Payer: Self-pay | Admitting: Cardiology

## 2024-05-12 ENCOUNTER — Ambulatory Visit (INDEPENDENT_AMBULATORY_CARE_PROVIDER_SITE_OTHER): Admitting: Family Medicine

## 2024-05-12 ENCOUNTER — Other Ambulatory Visit (HOSPITAL_COMMUNITY): Payer: Self-pay

## 2024-05-12 VITALS — BP 130/67 | HR 57 | Ht 71.5 in | Wt 224.2 lb

## 2024-05-12 DIAGNOSIS — M25461 Effusion, right knee: Secondary | ICD-10-CM

## 2024-05-12 MED ORDER — DICLOFENAC SODIUM 1 % EX GEL
2.0000 g | Freq: Four times a day (QID) | CUTANEOUS | 1 refills | Status: AC | PRN
  Filled 2024-05-12: qty 200, 24d supply, fill #0

## 2024-05-12 NOTE — Patient Instructions (Addendum)
 You should hear back from sports medicine to schedule an appointment within the next couple of weeks  In the meantime, you can continue using Aleve or ibuprofen to help reduce swelling and discomfort.  I recommend trying Voltaren  gel over-the-counter  Continue icing as needed.  You can also try using a compression sleeve or Ace wrap around her knee  Please seek medical attention if you develop any fevers, worsening swelling, redness, warmth in the area, or lose sensation in your toes or are unable to walk

## 2024-05-12 NOTE — Progress Notes (Signed)
    SUBJECTIVE:   CHIEF COMPLAINT / HPI:   R knee swelling and discomfort Ongoing about 5 days No injury, fevers, redness, warmth Reports a history of gout in his feet but that is typically much more painful and inflamed Reports a history of left knee surgery for a football injury several years ago Thinks it may be arthritis  No significant swelling in his other knee or any other joints  PERTINENT  PMH / PSH: HTN, history of prostate cancer, CKD, gout  OBJECTIVE:   BP 130/67   Pulse (!) 57   Ht 5' 11.5 (1.816 m)   Wt 224 lb 3.2 oz (101.7 kg)   SpO2 96%   BMI 30.83 kg/m   General: NAD, pleasant, able to participate in exam Respiratory: No respiratory distress Skin: warm and dry, no rashes noted Psych: Normal affect and mood  R knee Inspection: Swelling compared to left. otherwise no gross deformity or ecchymoses or wounds Palpation: Palpable suprapatellar effusion.  Nontender to palpation throughout anterior and posterior knee ROM: Full range of motion without restriction or popping Strength: 5/5 Special tests: No pain with varus/valgus stress, negative anterior/posterior drawers, negative Apley's, no significant laxity  ASSESSMENT/PLAN:   Assessment & Plan Swelling of right knee Suspect secondary to OA Attempted POCUS however had technical issues with the butterfly.  From the little that I was able to visualize I did see a notable suprapatellar effusion Recommend Voltaren , NSAIDs, icing/compression to help with the swelling.  Documented history of CKD but his most recent kidney function has been normal Placed referral to sports medicine for further evaluation given significant swelling on exam, they may consider formal ultrasound and/or additional imaging His pain seems well-controlled but if it is OA could consider injection down the road   Payton Coward, MD [facility]

## 2024-05-21 ENCOUNTER — Other Ambulatory Visit: Payer: Self-pay

## 2024-05-24 ENCOUNTER — Ambulatory Visit: Attending: Cardiology | Admitting: Cardiology

## 2024-05-24 ENCOUNTER — Other Ambulatory Visit (HOSPITAL_COMMUNITY): Payer: Self-pay

## 2024-05-24 ENCOUNTER — Encounter: Payer: Self-pay | Admitting: Cardiology

## 2024-05-24 VITALS — BP 130/72 | HR 65 | Resp 18 | Ht 71.0 in | Wt 215.8 lb

## 2024-05-24 DIAGNOSIS — C61 Malignant neoplasm of prostate: Secondary | ICD-10-CM | POA: Insufficient documentation

## 2024-05-24 DIAGNOSIS — I428 Other cardiomyopathies: Secondary | ICD-10-CM | POA: Diagnosis not present

## 2024-05-24 DIAGNOSIS — I2510 Atherosclerotic heart disease of native coronary artery without angina pectoris: Secondary | ICD-10-CM | POA: Insufficient documentation

## 2024-05-24 DIAGNOSIS — I25118 Atherosclerotic heart disease of native coronary artery with other forms of angina pectoris: Secondary | ICD-10-CM | POA: Insufficient documentation

## 2024-05-24 DIAGNOSIS — I10 Essential (primary) hypertension: Secondary | ICD-10-CM | POA: Diagnosis not present

## 2024-05-24 DIAGNOSIS — I5042 Chronic combined systolic (congestive) and diastolic (congestive) heart failure: Secondary | ICD-10-CM | POA: Diagnosis not present

## 2024-05-24 MED ORDER — ISOSORBIDE MONONITRATE ER 60 MG PO TB24
60.0000 mg | ORAL_TABLET | Freq: Every day | ORAL | 3 refills | Status: AC
  Filled 2024-05-24 – 2024-06-09 (×2): qty 90, 90d supply, fill #0
  Filled 2024-11-03: qty 90, 90d supply, fill #1

## 2024-05-24 NOTE — Patient Instructions (Signed)
 Medication Instructions:    INCREASE: Imdur to 60mg  daily   Lab Work: None Ordered If you have labs (blood work) drawn today and your tests are completely normal, you will receive your results only by: MyChart Message (if you have MyChart) OR A paper copy in the mail If you have any lab test that is abnormal or we need to change your treatment, we will call you to review the results.   Testing/Procedures: None Ordered   Follow-Up: At [facility], you and your health needs are our priority.  As part of our continuing mission to provide you with exceptional heart care, we have created designated Provider Care Teams.  These Care Teams include your primary Cardiologist (physician) and Advanced Practice Providers (APPs -  Physician Assistants and Nurse Practitioners) who all work together to provide you with the care you need, when you need it.  We recommend signing up for the patient portal called "MyChart".  Sign up information is provided on this After Visit Summary.  MyChart is used to connect with patients for Virtual Visits (Telemedicine).  Patients are able to view lab/test results, encounter notes, upcoming appointments, etc.  Non-urgent messages can be sent to your provider as well.   To learn more about what you can do with MyChart, go to [email or web address].    Your next appointment:   3 month(s)  The format for your next appointment:   In Person  Provider:   Gypsy Balsam, MD    Other Instructions NA

## 2024-05-24 NOTE — Progress Notes (Unsigned)
 Cardiology Office Note:    Date:  05/24/2024   ID:  [name], DOB July 25, 1956, MRN [id number]  PCP:  Janna Ferrier, DO  Cardiologist:  Lamar Fitch, MD    Referring MD: Janna Ferrier, DO   Chief Complaint  Patient presents with   Follow-up    History of Present Illness:    [name] is a 68 y.o. male past medical history significant for prostate cancer, congestive heart failure with previous diminished ejection fraction normalization, he was sent to us  for evaluation for potential coronary artery disease, coronary CT angio showed possibility of diagonal branch disease severe at the same time there was multiple calcifications with very high calcium  score, low FFR at the distal vessels probably related to simply deterioration of lower flow rather than discrete disease.  He comes today to talk about this.  He said he is doing quite well he said he is working the garden quite busy and have no difficulty doing it.  When he get tired he simply slows down stop and then recovering goes back.  Did not notice any changes recently.  Past Medical History:  Diagnosis Date   Alcohol abuse    still drink beer   CHF (congestive heart failure) (HCC)    Chronic combined systolic and diastolic heart failure, NYHA class 3 (HCC) 06/02/2012   Chronic kidney disease    stage 3 no nephrologist   Chronic systolic heart failure (HCC) 06/02/2012   Gout    none in years   History of radiation therapy    completed 03-22-2021   Hyperlipidemia    Hypertension    Need for prophylactic vaccination and inoculation against single disease 04/15/2018   NICM (nonischemic cardiomyopathy) (HCC) 09/04/2012   Prostate cancer (HCC) 07/2020   Psoriasis    Possible psoriasis in the past   Pulmonary nodule, right 11/03/2012    Past Surgical History:  Procedure Laterality Date   COLONOSCOPY     CYSTOSCOPY  01/23/2021   Procedure: CYSTOSCOPY FLEXIBLE;  Surgeon: Rosalind Zachary NOVAK, MD;  Location: [facility];  Service: Urology;;  no seeds found in bladder   KNEE SURGERY Left 1970's   arthroscopic knee out of joint   LEFT AND RIGHT HEART CATHETERIZATION WITH CORONARY ANGIOGRAM N/A 05/25/2012   Procedure: LEFT AND RIGHT HEART CATHETERIZATION WITH CORONARY ANGIOGRAM;  Surgeon: Toribio JONELLE Fuel, MD;  Location: [facility] CATH LAB;  Service: Cardiovascular;  Laterality: N/A;   POLYPECTOMY     RADIOACTIVE SEED IMPLANT N/A 01/23/2021   Procedure: RADIOACTIVE SEED IMPLANT/BRACHYTHERAPY IMPLANT;  Surgeon: Rosalind Zachary NOVAK, MD;  Location: [facility];  Service: Urology;  Laterality: N/A;   57  seeds implanted   SKIN GRAFT     L arm got caught in machine in 1979   SPACE OAR INSTILLATION N/A 01/23/2021   Procedure: SPACE OAR INSTILLATION;  Surgeon: Rosalind Zachary NOVAK, MD;  Location: [facility];  Service: Urology;  Laterality: N/A;    Current Medications: Current Meds  Medication Sig   amLODipine  (NORVASC ) 10 MG tablet Take 1 tablet (10 mg total) by mouth daily.   aspirin  EC 81 MG tablet Take 1 tablet (81 mg total) by mouth daily. Swallow whole.   atorvastatin  (LIPITOR) 40 MG tablet Take 1 tablet  by mouth daily.   azelastine  (OPTIVAR ) 0.05 % ophthalmic solution Place 1 drop into both eyes 2 (two) times daily.   carvedilol  (COREG ) 3.125 MG tablet Take 1 tablet by mouth 2 (two) times daily  with a meal.   diclofenac  Sodium (VOLTAREN ) 1 % GEL Apply 2 grams topically 4 (four) times daily as needed for pain.   isosorbide  mononitrate (IMDUR ) 30 MG 24 hr tablet Take 1 tablet (30 mg total) by mouth daily.   losartan  (COZAAR ) 25 MG tablet Take 1 tablet (25 mg total) by mouth at bedtime.   nitroGLYCERIN  (NITROSTAT ) 0.4 MG SL tablet Place 1 tablet (0.4 mg total) under the tongue every 5 (five) minutes as needed for chest pain.   selenium  sulfide (SELSUN ) 2.5 % lotion Apply 1 application topically daily as needed for irritation   tamsulosin  (FLOMAX ) 0.4 MG CAPS capsule Take  1 capsule (0.4 mg total) by mouth daily.   triamcinolone  cream (KENALOG ) 0.5 % Apply 1 Application topically to affected area(s) daily as needed.   Current Facility-Administered Medications for the 05/24/24 encounter (Office Visit) with Nollie Terlizzi J, MD  Medication   0.9 %  sodium chloride  infusion     Allergies:   Patient has no known allergies.   Social History   Socioeconomic History   Marital status: Legally Separated    Spouse name: Not on file   Number of children: 7   Years of education: 12   Highest education level: 12th grade  Occupational History    Comment: Retired. Formerly disabled due to heart failure.  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 Planning to move out next week. One of his daughters will be coming to stay with him.       Mobile home with some steps, has handrails. Non-working smoke alarms.    Likes to eat meat, pasta, vegetables, and fruit. Sweet tea is what he likes to drink, V8 juice.      Patient sings in choir.       Social Drivers of Corporate investment banker Strain: Low Risk  (02/02/2024)   Overall Financial Resource Strain (CARDIA)    Difficulty of Paying Living Expenses: Not hard at all  Food Insecurity: No Food Insecurity (02/02/2024)   Hunger Vital  Sign    Worried About Running Out of Food in the Last Year: Never true    Ran Out of Food in the Last Year: Never true  Transportation Needs: No Transportation Needs (02/02/2024)   PRAPARE - Administrator, Civil Service (Medical): No    Lack of Transportation (Non-Medical): No  Physical Activity: Insufficiently Active (02/02/2024)   Exercise Vital Sign    Days of Exercise per Week: 2 days    Minutes of Exercise per Session: 30 min  Stress: No Stress Concern Present (02/02/2024)   [employer] of Occupational Health - Occupational  Stress Questionnaire    Feeling of Stress : Not at all  Social Connections: Moderately Integrated (02/02/2024)   Social Connection and Isolation Panel    Frequency of Communication with Friends and Family: Once a week    Frequency of Social Gatherings with Friends and Family: More than three times a week    Attends Religious Services: More than 4 times per year    Active Member of [employer] or Organizations: Yes    Attends Banker Meetings: Never    Marital Status: Separated     Family History: The patient's family history includes Breast cancer in his daughter; Cancer in his brother and sister; Colon polyps in his brother and brother; Heart failure in his father and another family member; Prostate cancer in his brother and brother; Stroke in his mother. There is no history of Colon cancer, Esophageal cancer, Rectal cancer, or Stomach cancer. ROS:   Please see the history of present illness.    All 14 point review of systems negative except as described per history of present illness  EKGs/Labs/Other Studies Reviewed:    EKG Interpretation Date/Time:  Monday May 24 2024 10:26:58 EDT Ventricular Rate:  65 PR Interval:  194 QRS Duration:  104 QT Interval:  438 QTC Calculation: 455 R Axis:   21  Text Interpretation: Normal sinus rhythm Moderate voltage criteria for LVH, may be normal variant ( R in aVL , Sokolow-Lyon ) When compared  with ECG of 27-Apr-2024 10:45, PR interval has decreased Confirmed by Bernie Charleston [phone]) on 05/24/2024 10:58:12 AM    Recent Labs: 02/05/2024: ALT 16; BNP 78.7; BUN 14; Hemoglobin 12.8; Platelets 211; Potassium 4.7; Sodium 140 05/04/2024: Creatinine, Ser 1.20  Recent Lipid Panel    Component Value Date/Time   CHOL 147 04/27/2024 1131   TRIG 43 04/27/2024 1131   HDL 69 04/27/2024 1131   CHOLHDL 2.1 04/27/2024 1131   CHOLHDL 6.0 04/10/2016 0356   VLDL 23 04/10/2016 0356   LDLCALC 68 04/27/2024 1131    Physical Exam:    VS:  BP 130/72 (BP Location: Left Arm, Patient Position: Sitting, Cuff Size: Normal)   Pulse 65   Resp 18   Ht 5' 11 (1.803 m)   Wt 215 lb 12 oz (97.9 kg)   SpO2 95%   BMI 30.09 kg/m     Wt Readings from Last 3 Encounters:  05/24/24 215 lb 12 oz (97.9 kg)  05/12/24 224 lb 3.2 oz (101.7 kg)  04/27/24 224 lb (101.6 kg)     GEN:  Well nourished, well developed in no acute distress HEENT: Normal NECK: No JVD; No carotid bruits LYMPHATICS: No lymphadenopathy CARDIAC: RRR, no murmurs, no rubs, no gallops RESPIRATORY:  Clear to auscultation without rales, wheezing or rhonchi  ABDOMEN: Soft, non-tender, non-distended MUSCULOSKELETAL:  No edema; No deformity  SKIN: Warm and dry LOWER EXTREMITIES: no swelling NEUROLOGIC:  Alert and oriented x 3 PSYCHIATRIC:  Normal affect   ASSESSMENT:    1. NICM (nonischemic cardiomyopathy) (HCC)   2. Coronary artery disease of native artery of native heart with stable angina pectoris (HCC)   3. Chronic combined systolic and diastolic heart failure, NYHA class 3 (HCC)   4. Primary hypertension   5. Malignant neoplasm of prostate (HCC)    PLAN:    In order of problems listed above:  Coronary artery disease: A long discussion with him about options we did talk about cardiac catheterization versus medical therapy definitely preferred to use medical therapy.  I  will increase his Imdur  from 30-60 we will continue rest of  the medication which include antiplatelets therapy as well as Lipitor, we will continue watching him very carefully I will see him back in my office in about 2 to 3 months. Nonischemic cardiomyopathy last echocardiogram showed preserved left ventricle ejection fraction continue monitoring. Malignant prostate CA, that being followed by urology.  Stable. Congestive heart failure compensated. Essential hypertension blood pressure well-controlled. Dyslipidemia I did review K PN which show me data from 04/27/2024 with LDL of 68 HDL 69 continue monitoring   Medication Adjustments/Labs and Tests Ordered: Current medicines are reviewed at length with the patient today.  Concerns regarding medicines are outlined above.  Orders Placed This Encounter  Procedures   EKG 12-Lead   Medication changes: No orders of the defined types were placed in this encounter.   Signed, Lamar DOROTHA Fitch, MD, [facility] 05/24/2024 11:07 AM    [facility]

## 2024-05-25 ENCOUNTER — Other Ambulatory Visit: Payer: Self-pay | Admitting: Family Medicine

## 2024-05-25 ENCOUNTER — Telehealth: Payer: Self-pay

## 2024-05-25 ENCOUNTER — Ambulatory Visit
Admission: RE | Admit: 2024-05-25 | Discharge: 2024-05-25 | Disposition: A | Discharge status: Home / Self Care | Source: Ambulatory Visit | Attending: Family Medicine | Admitting: Family Medicine

## 2024-05-25 ENCOUNTER — Encounter: Payer: Self-pay | Admitting: Family Medicine

## 2024-05-25 ENCOUNTER — Ambulatory Visit (INDEPENDENT_AMBULATORY_CARE_PROVIDER_SITE_OTHER): Admitting: Family Medicine

## 2024-05-25 VITALS — BP 133/67 | Ht 72.0 in | Wt 215.0 lb

## 2024-05-25 DIAGNOSIS — G8929 Other chronic pain: Secondary | ICD-10-CM | POA: Diagnosis not present

## 2024-05-25 DIAGNOSIS — M25461 Effusion, right knee: Secondary | ICD-10-CM | POA: Diagnosis not present

## 2024-05-25 DIAGNOSIS — M25561 Pain in right knee: Secondary | ICD-10-CM

## 2024-05-25 NOTE — Assessment & Plan Note (Addendum)
 Patient's right knee pain and swelling are most likely due to underlying osteoarthritis/degenerative joint disease given his medial joint line tenderness, trace effusion, and otherwise reassuring exam.   Discussed with patient that repeat x-rays of his right knee will allow us  to see if osteoarthritis has developed/progressed since his x-rays in 2019. Additionally, provided patient the option of a steroid injection in clinic today for relief of symptoms but patient declined treatment at this time. Advised patient to continue using Voltaren  gel and Tylenol  as needed for pain, however, he should avoid oral NSAIDs given his history of chronic kidney disease. Lastly, recommended patient wear a knee sleeve/brace while exercising/being active to help control swelling and provide additional support. Advised patient to follow-up as needed.   - X-ray of right knee  - Continue Voltaren  gel and Tylenol  as needed   - Encouraged use of brace/sleeve with activity  - Follow-up as needed

## 2024-05-25 NOTE — Progress Notes (Signed)
 DATE OF VISIT: 05/25/2024        [name] DOB: 20-Sep-1956 MRN: [id number]  CC:  Right knee pain  History of present Illness: [name] is a 68 y.o. male who presents for evaluation of right knee pain.  Patient reports that he has been experiencing pain in his right knee for the past month. He has a history of pain and swelling in this knee but says that it usually resolves on its own. He was on 05/12/2024 by his primary care provider who recommended rest, ice, Voltaren  gel, and NSAIDs. Patient has been using Voltaren  gel without much relief. He denies any injury or trauma recently to his right knee but does note that he dislocated it back in high school. In January of 2023 he had fluid drained from the knee that was positive for monosodium urate crystals. X-ray of the right knee from June of 2019 was unremarkable.   Medications:  Outpatient Encounter Medications as of 05/25/2024  Medication Sig   amLODipine  (NORVASC ) 10 MG tablet Take 1 tablet (10 mg total) by mouth daily.   aspirin  EC 81 MG tablet Take 1 tablet (81 mg total) by mouth daily. Swallow whole.   atorvastatin  (LIPITOR) 40 MG tablet Take 1 tablet  by mouth daily.   azelastine  (OPTIVAR ) 0.05 % ophthalmic solution Place 1 drop into both eyes 2 (two) times daily.   carvedilol  (COREG ) 3.125 MG tablet Take 1 tablet by mouth 2 (two) times daily with a meal.   diclofenac  Sodium (VOLTAREN ) 1 % GEL Apply 2 grams topically 4 (four) times daily as needed for pain.   isosorbide  mononitrate (IMDUR ) 60 MG 24 hr tablet Take 1 tablet (60 mg total) by mouth daily.   losartan  (COZAAR ) 25 MG tablet Take 1 tablet (25 mg total) by mouth at bedtime.   nitroGLYCERIN  (NITROSTAT ) 0.4 MG SL tablet Place 1 tablet (0.4 mg total) under the tongue every 5 (five) minutes as needed for chest pain.   selenium  sulfide (SELSUN ) 2.5 % lotion Apply 1 application topically daily as needed for irritation   tamsulosin  (FLOMAX ) 0.4 MG CAPS capsule Take 1 capsule (0.4  mg total) by mouth daily.   triamcinolone  cream (KENALOG ) 0.5 % Apply 1 Application topically to affected area(s) daily as needed.   Facility-Administered Encounter Medications as of 05/25/2024  Medication   0.9 %  sodium chloride  infusion    Allergies: has no known allergies.  Physical Examination: Vitals: BP 133/67   Ht 6' (1.829 m)   Wt 215 lb (97.5 kg)   BMI 29.16 kg/m  GENERAL:  [name] is a 68 y.o. male appearing their stated age, alert and oriented x 3, in no apparent distress.  SKIN: no rashes or lesions, skin clean, dry, intact MSK: Right knee Inspection: There is a slight effusion of the right knee. No bony or soft tissue abnormalities appreciated.  Palpation: There is medial joint line tenderness to palpation. No tenderness to palpation of the quadriceps tendon, patella, patellar tendon, lateral joint line, or tibial tuberosity.  ROM: FROM with flexion and extension. No ligamentous laxity appreciated with varus/valgus stress.  Strength: 5/5 with knee flexion/extension, hip flexion, and plantarflexion/dorsiflexion.  Special Tests: Negative Anterior/Posterior drawer, negative McMurray's, Negative Apley's   Neuro/Vasc: sensation intact to light touch, pulses 2+ and symmetric pedal pulses bilaterally, no edema  Radiology: 03/11/2018 XRAY Right Knee personally reviewed and interpreted by myself and Dr Teressa today showing:  - no acute bony abnormalities, no significant arthritis noted Assessment & Plan  Chronic pain of right knee Patient's right knee pain and swelling are most likely due to underlying osteoarthritis/degenerative joint disease given his medial joint line tenderness, trace effusion, and otherwise reassuring exam.   Discussed with patient that repeat x-rays of his right knee will allow us  to see if osteoarthritis has developed/progressed since his x-rays in 2019. Additionally, provided patient the option of a steroid injection in clinic today for relief of  symptoms but patient declined treatment at this time. Advised patient to continue using Voltaren  gel and Tylenol  as needed for pain, however, he should avoid oral NSAIDs given his history of chronic kidney disease. Lastly, recommended patient wear a knee sleeve/brace while exercising/being active to help control swelling and provide additional support. Advised patient to follow-up as needed.   - X-ray of right knee  - Continue Voltaren  gel and Tylenol  as needed   - Encouraged use of brace/sleeve with activity  - Follow-up as needed   Patient expressed understanding & agreement with above.  Encounter Diagnosis  Name Primary?   Chronic pain of right knee Yes    Orders Placed This Encounter  Procedures   DG Knee 4 Views W/Patella Right   Signe Ravel, MS4 [facility]

## 2024-05-25 NOTE — Telephone Encounter (Signed)
 Pt discussed with Dr. Krasowski at appt 05-24-24

## 2024-06-02 ENCOUNTER — Other Ambulatory Visit (HOSPITAL_COMMUNITY): Payer: Self-pay

## 2024-06-04 ENCOUNTER — Ambulatory Visit: Payer: Self-pay | Admitting: Family Medicine

## 2024-06-04 NOTE — Progress Notes (Signed)
 Xrays reviewed, MyChart message sent.

## 2024-06-09 ENCOUNTER — Other Ambulatory Visit: Payer: Self-pay | Admitting: Family Medicine

## 2024-06-09 ENCOUNTER — Other Ambulatory Visit (HOSPITAL_COMMUNITY): Payer: Self-pay

## 2024-06-09 DIAGNOSIS — I10 Essential (primary) hypertension: Secondary | ICD-10-CM

## 2024-06-10 ENCOUNTER — Other Ambulatory Visit (HOSPITAL_COMMUNITY): Payer: Self-pay

## 2024-06-10 MED ORDER — AMLODIPINE BESYLATE 10 MG PO TABS
10.0000 mg | ORAL_TABLET | Freq: Every day | ORAL | 1 refills | Status: AC
  Filled 2024-06-10: qty 90, 90d supply, fill #0
  Filled 2024-11-03: qty 90, 90d supply, fill #1

## 2024-06-10 MED ORDER — ATORVASTATIN CALCIUM 40 MG PO TABS
40.0000 mg | ORAL_TABLET | Freq: Every day | ORAL | 3 refills | Status: AC
  Filled 2024-06-10: qty 90, 90d supply, fill #0
  Filled 2024-11-03: qty 90, 90d supply, fill #1

## 2024-06-30 DIAGNOSIS — N189 Chronic kidney disease, unspecified: Secondary | ICD-10-CM | POA: Insufficient documentation

## 2024-06-30 DIAGNOSIS — L409 Psoriasis, unspecified: Secondary | ICD-10-CM | POA: Insufficient documentation

## 2024-06-30 DIAGNOSIS — I509 Heart failure, unspecified: Secondary | ICD-10-CM | POA: Insufficient documentation

## 2024-06-30 DIAGNOSIS — Z923 Personal history of irradiation: Secondary | ICD-10-CM | POA: Insufficient documentation

## 2024-06-30 DIAGNOSIS — F1010 Alcohol abuse, uncomplicated: Secondary | ICD-10-CM | POA: Insufficient documentation

## 2024-07-01 ENCOUNTER — Ambulatory Visit: Admitting: Cardiology

## 2024-07-01 ENCOUNTER — Ambulatory Visit: Attending: Cardiology | Admitting: Cardiology

## 2024-07-01 ENCOUNTER — Encounter: Payer: Self-pay | Admitting: Cardiology

## 2024-07-01 VITALS — BP 138/62 | HR 68 | Ht 71.0 in | Wt 222.4 lb

## 2024-07-01 DIAGNOSIS — I428 Other cardiomyopathies: Secondary | ICD-10-CM | POA: Insufficient documentation

## 2024-07-01 DIAGNOSIS — I25118 Atherosclerotic heart disease of native coronary artery with other forms of angina pectoris: Secondary | ICD-10-CM | POA: Insufficient documentation

## 2024-07-01 DIAGNOSIS — C61 Malignant neoplasm of prostate: Secondary | ICD-10-CM | POA: Diagnosis not present

## 2024-07-01 DIAGNOSIS — N1831 Chronic kidney disease, stage 3a: Secondary | ICD-10-CM | POA: Diagnosis not present

## 2024-07-01 NOTE — Patient Instructions (Signed)

## 2024-07-01 NOTE — Progress Notes (Unsigned)
 Cardiology Office Note:    Date:  07/01/2024   ID:  [name], DOB [birth year], MRN [id number]  PCP:  Janna Ferrier, DO  Cardiologist:  Lamar Fitch, MD    Referring MD: Janna Ferrier, DO   No chief complaint on file. Doing very well  History of Present Illness:    [name] is a 69 y.o. male past medical history significant for prostate cancer, congestive heart failure with previously diminished left ventricular ejection fraction but now with normalization, coronary CT angio was done which showed possibility of significant hemodynamically lesion of the diagonal branch also very high calcium  score but low FFR distally which is probably related to simply deterioration of flow rather than discrete stenosis.  Last time I have seen you had a long discussion with what to do with the situation we discussed option cardiac catheterization versus medical therapy, he chose medical therapy.  The increased dose of his isosorbide  mononitrate comes today to my office he is doing great asymptomatic goes to gym on the regular basis walk on the treadmill for 1 hour 2.5 mph he has no difficulty doing this  Past Medical History:  Diagnosis Date   Alcohol abuse    still drink beer   CHF (congestive heart failure) (HCC)    Chronic combined systolic and diastolic heart failure, NYHA class 3 (HCC) 06/02/2012   Chronic kidney disease    stage 3 no nephrologist   Chronic systolic heart failure (HCC) 06/02/2012   Gout    none in years   History of radiation therapy    completed 03-22-2021   Hyperlipidemia    Hypertension    Need for prophylactic vaccination and inoculation against single disease 04/15/2018   NICM (nonischemic cardiomyopathy) (HCC) 09/04/2012   Prostate cancer (HCC) 07/2020   Psoriasis    Possible psoriasis in the past   Pulmonary nodule, right 11/03/2012    Past Surgical History:  Procedure Laterality Date   COLONOSCOPY     CYSTOSCOPY  01/23/2021   Procedure: CYSTOSCOPY  FLEXIBLE;  Surgeon: Rosalind Zachary NOVAK, MD;  Location: [facility];  Service: Urology;;  no seeds found in bladder   KNEE SURGERY Left 1970's   arthroscopic knee out of joint   LEFT AND RIGHT HEART CATHETERIZATION WITH CORONARY ANGIOGRAM N/A 05/25/2012   Procedure: LEFT AND RIGHT HEART CATHETERIZATION WITH CORONARY ANGIOGRAM;  Surgeon: Toribio JONELLE Fuel, MD;  Location: [facility] CATH LAB;  Service: Cardiovascular;  Laterality: N/A;   POLYPECTOMY     RADIOACTIVE SEED IMPLANT N/A 01/23/2021   Procedure: RADIOACTIVE SEED IMPLANT/BRACHYTHERAPY IMPLANT;  Surgeon: Rosalind Zachary NOVAK, MD;  Location: [facility];  Service: Urology;  Laterality: N/A;   57  seeds implanted   SKIN GRAFT     L arm got caught in machine in 1979   SPACE OAR INSTILLATION N/A 01/23/2021   Procedure: SPACE OAR INSTILLATION;  Surgeon: Rosalind Zachary NOVAK, MD;  Location: [facility];  Service: Urology;  Laterality: N/A;    Current Medications: Current Meds  Medication Sig   amLODipine  (NORVASC ) 10 MG tablet Take 1 tablet (10 mg total) by mouth daily.   aspirin  EC 81 MG tablet Take 1 tablet (81 mg total) by mouth daily. Swallow whole.   atorvastatin  (LIPITOR) 40 MG tablet Take 1 tablet (40 mg total) by mouth daily.   azelastine  (OPTIVAR ) 0.05 % ophthalmic solution Place 1 drop into both eyes 2 (two) times daily.   carvedilol  (COREG ) 3.125 MG tablet Take 1 tablet  by mouth 2 (two) times daily with a meal.   diclofenac  Sodium (VOLTAREN ) 1 % GEL Apply 2 grams topically 4 (four) times daily as needed for pain.   isosorbide  mononitrate (IMDUR ) 60 MG 24 hr tablet Take 1 tablet (60 mg total) by mouth daily.   losartan  (COZAAR ) 25 MG tablet Take 1 tablet (25 mg total) by mouth at bedtime.   nitroGLYCERIN  (NITROSTAT ) 0.4 MG SL tablet Place 1 tablet (0.4 mg total) under the tongue every 5 (five) minutes as needed for chest pain.   selenium  sulfide (SELSUN ) 2.5 % lotion Apply 1 application topically  daily as needed for irritation   tamsulosin  (FLOMAX ) 0.4 MG CAPS capsule Take 1 capsule (0.4 mg total) by mouth daily.   triamcinolone  cream (KENALOG ) 0.5 % Apply 1 Application topically to affected area(s) daily as needed.   Current Facility-Administered Medications for the 07/01/24 encounter (Office Visit) with Cordelro Gautreau J, MD  Medication   0.9 %  sodium chloride  infusion     Allergies:   Patient has no known allergies.   Social History   Socioeconomic History   Marital status: Legally Separated    Spouse name: Not on file   Number of children: 7   Years of education: 12   Highest education level: 12th grade  Occupational History    Comment: Retired. Formerly disabled due to heart failure.  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 Planning to move out next week. One of his daughters will be coming to stay with him.       Mobile home with some steps, has handrails. Non-working smoke alarms.    Likes to eat meat, pasta, vegetables, and fruit. Sweet tea is what he likes to drink, V8 juice.      Patient sings in choir.       Social Drivers of Corporate investment banker Strain: Low Risk  (02/02/2024)   Overall Financial Resource Strain (CARDIA)    Difficulty of Paying Living Expenses: Not  hard at all  Food Insecurity: No Food Insecurity (02/02/2024)   Hunger Vital Sign    Worried About Running Out of Food in the Last Year: Never true    Ran Out of Food in the Last Year: Never true  Transportation Needs: No Transportation Needs (02/02/2024)   PRAPARE - Administrator, Civil Service (Medical): No    Lack of Transportation (Non-Medical): No  Physical Activity: Insufficiently Active (02/02/2024)   Exercise Vital Sign    Days of Exercise per Week: 2 days    Minutes of Exercise per Session: 30 min  Stress: No Stress Concern Present (02/02/2024)   [place]  [employer]stitute of Occupational Health - Occupational Stress Questionnaire    Feeling of Stress : Not at all  Social Connections: Moderately Integrated (02/02/2024)   Social Connection and Isolation Panel    Frequency of Communication with Friends and Family: Once a week    Frequency of Social Gatherings with Friends and Family: More than three times a week    Attends Religious Services: More than 4 times per year    Active Member of [employer] or Organizations: Yes    Attends Banker Meetings: Never    Marital Status: Separated     Family History: The patient's family history includes Breast cancer in his daughter; Cancer in his brother and sister; Colon polyps in his brother and brother; Heart failure in his father and another family member; Prostate cancer in his brother and brother; Stroke in his mother. There is no history of Colon cancer, Esophageal cancer, Rectal cancer, or Stomach cancer. ROS:   Please see the history of present illness.    All 14 point review of systems negative except as described per history of present illness  EKGs/Labs/Other Studies Reviewed:         Recent Labs: 02/05/2024: ALT 16; BNP 78.7; BUN 14; Hemoglobin 12.8; Platelets 211; Potassium 4.7; Sodium 140 05/04/2024: Creatinine, Ser 1.20  Recent Lipid Panel    Component Value Date/Time   CHOL 147 04/27/2024 1131   TRIG 43 04/27/2024  1131   HDL 69 04/27/2024 1131   CHOLHDL 2.1 04/27/2024 1131   CHOLHDL 6.0 04/10/2016 0356   VLDL 23 04/10/2016 0356   LDLCALC 68 04/27/2024 1131    Physical Exam:    VS:  BP 138/62   Pulse 68   Ht 5' 11 (1.803 m)   Wt 222 lb 6.4 oz (100.9 kg)   SpO2 95%   BMI 31.02 kg/m     Wt Readings from Last 3 Encounters:  07/01/24 222 lb 6.4 oz (100.9 kg)  05/25/24 215 lb (97.5 kg)  05/24/24 215 lb 12 oz (97.9 kg)     GEN:  Well nourished, well developed in no acute distress HEENT: Normal NECK: No JVD; No carotid bruits LYMPHATICS: No lymphadenopathy CARDIAC: RRR, no murmurs, no rubs, no gallops RESPIRATORY:  Clear to auscultation without rales, wheezing or rhonchi  ABDOMEN: Soft, non-tender, non-distended MUSCULOSKELETAL:  No edema; No deformity  SKIN: Warm and dry LOWER EXTREMITIES: no swelling NEUROLOGIC:  Alert and oriented x 3 PSYCHIATRIC:  Normal affect   ASSESSMENT:    1. NICM (nonischemic cardiomyopathy) (HCC)   2. Coronary artery disease of native artery of native heart with stable angina pectoris   3. Malignant neoplasm of prostate (HCC)   4. Stage 3a chronic kidney disease (HCC)    PLAN:    In order of problems listed above:  Nonischemic cardiomyopathy, normalization continue monitoring. Coronary disease with a diagonal branch disease likely completely asymptomatic, will continue monitoring. Malignant cancer of prostate stable follow-up oncology team. Stage III kidney failure.  Noted. Overall he is doing very well a warning about signs and symptoms of problem we will continue monitoring   Medication Adjustments/Labs and Tests Ordered: Current medicines are reviewed at length with the patient today.  Concerns regarding medicines are outlined above.  No orders of the defined types were placed in this encounter.  Medication changes: No orders of the defined types were placed in this encounter.   Signed, Lamar DOROTHA Fitch, MD, [facility] 07/01/2024 2:38 PM     [facility]

## 2024-07-08 ENCOUNTER — Telehealth: Payer: Self-pay

## 2024-07-08 DIAGNOSIS — Z8546 Personal history of malignant neoplasm of prostate: Secondary | ICD-10-CM | POA: Diagnosis not present

## 2024-07-08 NOTE — Telephone Encounter (Signed)
 Patient scheduled on Monday 07/12/24, thanks.

## 2024-07-08 NOTE — Telephone Encounter (Signed)
 Patient called office to see what his next steps could being about his knee is still giving him problems, please advise, thanks.

## 2024-07-12 ENCOUNTER — Ambulatory Visit (INDEPENDENT_AMBULATORY_CARE_PROVIDER_SITE_OTHER): Admitting: Family Medicine

## 2024-07-12 ENCOUNTER — Encounter: Payer: Self-pay | Admitting: Family Medicine

## 2024-07-12 VITALS — BP 115/54 | Ht 71.0 in | Wt 222.0 lb

## 2024-07-12 DIAGNOSIS — M1711 Unilateral primary osteoarthritis, right knee: Secondary | ICD-10-CM | POA: Diagnosis not present

## 2024-07-12 DIAGNOSIS — N183 Chronic kidney disease, stage 3 unspecified: Secondary | ICD-10-CM

## 2024-07-12 MED ORDER — METHYLPREDNISOLONE ACETATE 40 MG/ML IJ SUSP
40.0000 mg | Freq: Once | INTRAMUSCULAR | Status: AC
  Administered 2024-07-12: 40 mg via INTRA_ARTICULAR

## 2024-07-12 NOTE — Progress Notes (Signed)
 DATE OF VISIT: 07/12/2024        [name] DOB: 1956/04/16 MRN: [id number]  Discussed the use of AI scribe software for clinical note transcription with the patient, who gave verbal consent to proceed.  History of Present Illness [name] is a 68 year old male with knee arthritis who presents with right knee pain and swelling after exercise. Last seen by me 05/25/24 - recommended voltaren  gel and bracing prn  Right knee pain and swelling - Acute onset of sharp right knee pain after walking on a treadmill for 55 minutes, described as feeling like 'a nail drove in it' - Pain recurred after continuing to walk for a few more minutes - Significant swelling developed in the right knee upon returning home - Pain characterized as a 'toothache' type of ache - Knee brace provides symptomatic relief - Tiger Balm provides some relief; uncertain benefit from Voltaren  gel - No recent use of heat or ice - No recent cortisone injections or joint aspiration; last performed in the 1970s  Mechanical knee symptoms - Chronic history of right knee clicking, catching, and popping - No recent change in mechanical symptoms  Imaging and prior interventions - Recent x-rays (May 25, 2024) demonstrated mild arthritic changes, narrowing of the medial joint space, and mild effusion - History of prior cortisone injections and joint aspiration in the 1970s  Functional impact - Unable to use treadmill at the gym, which is his primary form of exercise - Misses regular exercise due to knee symptoms  Medication use - Takes aspirin  - No use of other blood thinners - No medication allergies - unable to take NSAIDs due to CKD    Medications:  Outpatient Encounter Medications as of 07/12/2024  Medication Sig   amLODipine  (NORVASC ) 10 MG tablet Take 1 tablet (10 mg total) by mouth daily.   aspirin  EC 81 MG tablet Take 1 tablet (81 mg total) by mouth daily. Swallow whole.   atorvastatin  (LIPITOR) 40 MG  tablet Take 1 tablet (40 mg total) by mouth daily.   azelastine  (OPTIVAR ) 0.05 % ophthalmic solution Place 1 drop into both eyes 2 (two) times daily.   carvedilol  (COREG ) 3.125 MG tablet Take 1 tablet by mouth 2 (two) times daily with a meal.   diclofenac  Sodium (VOLTAREN ) 1 % GEL Apply 2 grams topically 4 (four) times daily as needed for pain.   isosorbide  mononitrate (IMDUR ) 60 MG 24 hr tablet Take 1 tablet (60 mg total) by mouth daily.   losartan  (COZAAR ) 25 MG tablet Take 1 tablet (25 mg total) by mouth at bedtime.   nitroGLYCERIN  (NITROSTAT ) 0.4 MG SL tablet Place 1 tablet (0.4 mg total) under the tongue every 5 (five) minutes as needed for chest pain.   selenium  sulfide (SELSUN ) 2.5 % lotion Apply 1 application topically daily as needed for irritation   tamsulosin  (FLOMAX ) 0.4 MG CAPS capsule Take 1 capsule (0.4 mg total) by mouth daily.   triamcinolone  cream (KENALOG ) 0.5 % Apply 1 Application topically to affected area(s) daily as needed.   Facility-Administered Encounter Medications as of 07/12/2024  Medication   0.9 %  sodium chloride  infusion   [COMPLETED] methylPREDNISolone  acetate (DEPO-MEDROL ) injection 40 mg    Allergies: has no known allergies.  Physical Examination: Vitals: BP (!) 115/54   Ht 5' 11 (1.803 m)   Wt 222 lb (100.7 kg)   BMI 30.96 kg/m  GENERAL:  [name] is a 68 y.o. male appearing their stated age, alert and oriented x 3,  in no apparent distress.  SKIN: Scattered psoriatic plaques bilateral lower legs MSK: KNEE: Right knee with trace effusion.  Fullness in the posterior knee likely consistent with small Baker's cyst.  No increased redness or warmth.  Full range of motion with pain at terminal flexion.  Tender palpation along the medial and lateral joint line.  Pain with McMurray, no palpable click.  No ligamentous laxity. Left knee with full range of motion without pain, weakness, instability Walking without a limp Neurovascular intact  distally  Radiology: XRAY: Right knee x-rays 05/25/2024 personally reviewed and interpreted by me today showing: - Medial joint space narrowing - Small effusion - No other bony abnormalities  Assessment and Plan Assessment & Plan Right knee osteoarthritis with likely small Baker's cyst Chronic osteoarthritis with likely small Baker's cyst, exacerbated by treadmill use. X-rays show mild arthritic changes, medial joint space narrowing, and effusion.  Unable to take NSAIDs due to CKD.  No recent injections -Treatment options reviewed.  Would be candidate for trial of cortisone injections today, especially since he cannot take oral NSAIDs.  He is interested in this today. - Administered cortisone injection to right knee as noted below. Discussed risks of bleeding, infection, joint breakdown. Informed consent obtained. - Advised rest and avoidance of treadmill for 2-3 days post-injection. - Recommended heat, ice, Voltaren  gel, or Tiger Balm for pain management as needed. - Consider physical therapy to strengthen muscles and reduce knee pressure in the future. - Continue to use knee sleeve as he is doing - Schedule follow-up in 6 weeks to assess progress.  PROCEDURE:  Risks & benefits of RT knee cortisone injection reviewed. Consent obtained. Time-out completed. Patient prepped and draped in the normal fashion. Area cleansed with alcohol. Ethyl chloride spray used to anesthetize the skin. Solution of 4 mL 1% lidocaine  with 1 mL methylprednisolone  (Depo-medrol ) 40mg /mL injected into the RT knee using a 25-gauge 1.5-inch needle via the anterior medial approach. Patient tolerated procedure well without any complications. Area covered with adhesive bandage.  Post-procedure care reviewed, all questions answered.   Chronic kidney disease stage III - Unable to take oral NSAIDs.  Can use topical Voltaren  gel as needed sparingly - Due to inability to take oral NSAIDs, is candidate for cortisone injection.   Completed as noted above     Patient expressed understanding & agreement with above.  Encounter Diagnoses  Name Primary?   Primary osteoarthritis of right knee Yes   Stage 3 chronic kidney disease, unspecified whether stage 3a or 3b CKD (HCC)     No orders of the defined types were placed in this encounter.    VISIT SUMMARY: Today, you were seen for right knee pain and swelling that started after exercising on the treadmill. You described the pain as sharp and like a 'toothache,' and noted significant swelling in your right knee. You have a history of knee arthritis and mechanical symptoms like clicking and popping. Recent x-rays showed mild arthritis and a Baker's cyst.  YOUR PLAN: -RIGHT KNEE OSTEOARTHRITIS WITH BAKER'S CYST: You have chronic osteoarthritis in your right knee, which means the cartilage in your knee is wearing down, causing pain and swelling. A Baker's cyst is a fluid-filled swelling that can develop behind the knee. Today, you received a cortisone injection to help reduce inflammation and pain. You should start to feel relief in 3-5 days, and it may last up to 2 weeks. Please rest and avoid using the treadmill for 2-3 days. You can use heat, ice, Voltaren  gel,  or Tiger Balm to manage pain as needed. Physical therapy may help strengthen your muscles and reduce pressure on your knee.  INSTRUCTIONS: Please schedule a follow-up appointment in 6 weeks to assess your progress. If you have any concerns or if your symptoms worsen, contact our office. Contains text generated by Abridge.

## 2024-07-12 NOTE — Patient Instructions (Signed)

## 2024-07-15 DIAGNOSIS — C61 Malignant neoplasm of prostate: Secondary | ICD-10-CM | POA: Diagnosis not present

## 2024-07-19 ENCOUNTER — Other Ambulatory Visit: Payer: Self-pay

## 2024-07-19 ENCOUNTER — Other Ambulatory Visit (HOSPITAL_COMMUNITY): Payer: Self-pay

## 2024-08-23 ENCOUNTER — Ambulatory Visit (INDEPENDENT_AMBULATORY_CARE_PROVIDER_SITE_OTHER): Admitting: Family Medicine

## 2024-08-23 ENCOUNTER — Encounter: Payer: Self-pay | Admitting: Family Medicine

## 2024-08-23 VITALS — BP 138/88 | Ht 71.0 in | Wt 222.0 lb

## 2024-08-23 DIAGNOSIS — M1711 Unilateral primary osteoarthritis, right knee: Secondary | ICD-10-CM

## 2024-08-23 DIAGNOSIS — M25561 Pain in right knee: Secondary | ICD-10-CM | POA: Diagnosis not present

## 2024-08-23 DIAGNOSIS — G8929 Other chronic pain: Secondary | ICD-10-CM

## 2024-08-23 NOTE — Progress Notes (Signed)
 DATE OF VISIT: 08/23/2024        [name] DOB: 04-Oct-1955 MRN: [id number]  Discussed the use of AI scribe software for clinical note transcription with the patient, who gave verbal consent to proceed.  History of Present Illness [name] is a 68 year old male with right knee arthritis who presents with persistent knee pain despite prior treatment.  RT Knee pain - Persistent knee pain without significant improvement following recent cortisone injection at last visit 07/12/24 - Mild improvement for 2-3 days post-injection, then return to baseline pain - Pain described as feeling like a bruise - No new injuries to the knee - Pain becomes intolerable within thirty minutes without use of a knee sleeve  Pain management interventions - Continues to use Tiger Balm and Voltaren  gel for symptomatic relief - Significant relief with use of a knee sleeve  Surgical history and structural knee abnormalities - History of multiple left knee surgeries during high school for cartilage problems    Medications:  Outpatient Encounter Medications as of 08/23/2024  Medication Sig   amLODipine  (NORVASC ) 10 MG tablet Take 1 tablet (10 mg total) by mouth daily.   aspirin  EC 81 MG tablet Take 1 tablet (81 mg total) by mouth daily. Swallow whole.   atorvastatin  (LIPITOR) 40 MG tablet Take 1 tablet (40 mg total) by mouth daily.   azelastine  (OPTIVAR ) 0.05 % ophthalmic solution Place 1 drop into both eyes 2 (two) times daily.   carvedilol  (COREG ) 3.125 MG tablet Take 1 tablet by mouth 2 (two) times daily with a meal.   diclofenac  Sodium (VOLTAREN ) 1 % GEL Apply 2 grams topically 4 (four) times daily as needed for pain.   isosorbide  mononitrate (IMDUR ) 60 MG 24 hr tablet Take 1 tablet (60 mg total) by mouth daily.   losartan  (COZAAR ) 25 MG tablet Take 1 tablet (25 mg total) by mouth at bedtime.   nitroGLYCERIN  (NITROSTAT ) 0.4 MG SL tablet Place 1 tablet (0.4 mg total) under the tongue every 5 (five)  minutes as needed for chest pain.   selenium  sulfide (SELSUN ) 2.5 % lotion Apply 1 application topically daily as needed for irritation   tamsulosin  (FLOMAX ) 0.4 MG CAPS capsule Take 1 capsule (0.4 mg total) by mouth daily.   triamcinolone  cream (KENALOG ) 0.5 % Apply 1 Application topically to affected area(s) daily as needed.   Facility-Administered Encounter Medications as of 08/23/2024  Medication   0.9 %  sodium chloride  infusion    Allergies: has no known allergies.  Physical Examination: Vitals: BP 138/88   Ht 5' 11 (1.803 m)   Wt 222 lb (100.7 kg)   BMI 30.96 kg/m  GENERAL:  [name] is a 68 y.o. male appearing their stated age, alert and oriented x 3, in no apparent distress.  MSK: Right knee without swelling or effusion.  Tender palpation along the medial joint line, no lateral joint line tenderness.  No ligamentous laxity.  Mild discomfort with McMurray.  Walking without a limp. Neurovascularly intact distally Assessment & Plan Ongoing acute on chronic right knee pain with associated osteoarthritis  Chronic osteoarthritis with persistent pain. Previous cortisone injection minimally effective.  - Ordered MRI of right knee to assess cartilage and arthritis severity.  Patient would be interested in surgical intervention if indicated - Continue knee brace and topical medications (Tiger Balm and Voltaren  gel). - Following MRI may consider potential gel lubricating injections or surgical intervention. - Follow-up 1 week after MRI to review results and discuss further treatment  Patient expressed understanding & agreement with above.  Encounter Diagnoses  Name Primary?   Chronic pain of right knee Yes   Primary osteoarthritis of right knee     Orders Placed This Encounter  Procedures   MR Knee Right Wo Contrast     Contains text generated by Abridge.

## 2024-08-29 ENCOUNTER — Other Ambulatory Visit (HOSPITAL_COMMUNITY): Payer: Self-pay

## 2024-08-29 ENCOUNTER — Other Ambulatory Visit: Payer: Self-pay | Admitting: Family Medicine

## 2024-08-29 DIAGNOSIS — L309 Dermatitis, unspecified: Secondary | ICD-10-CM

## 2024-08-30 ENCOUNTER — Ambulatory Visit: Attending: Cardiology | Admitting: Cardiology

## 2024-08-30 ENCOUNTER — Other Ambulatory Visit: Payer: Self-pay

## 2024-08-30 ENCOUNTER — Encounter: Payer: Self-pay | Admitting: Cardiology

## 2024-08-30 ENCOUNTER — Other Ambulatory Visit (HOSPITAL_COMMUNITY): Payer: Self-pay

## 2024-08-30 VITALS — BP 152/72 | HR 73 | Ht 71.0 in

## 2024-08-30 DIAGNOSIS — I5022 Chronic systolic (congestive) heart failure: Secondary | ICD-10-CM | POA: Diagnosis not present

## 2024-08-30 DIAGNOSIS — C61 Malignant neoplasm of prostate: Secondary | ICD-10-CM | POA: Insufficient documentation

## 2024-08-30 DIAGNOSIS — I25118 Atherosclerotic heart disease of native coronary artery with other forms of angina pectoris: Secondary | ICD-10-CM | POA: Diagnosis not present

## 2024-08-30 DIAGNOSIS — N289 Disorder of kidney and ureter, unspecified: Secondary | ICD-10-CM | POA: Diagnosis not present

## 2024-08-30 MED ORDER — TRIAMCINOLONE ACETONIDE 0.5 % EX CREA
1.0000 | TOPICAL_CREAM | Freq: Every day | CUTANEOUS | 3 refills | Status: AC | PRN
  Filled 2024-08-30: qty 15, 30d supply, fill #0
  Filled 2024-09-29: qty 15, 30d supply, fill #1
  Filled 2024-11-03: qty 15, 30d supply, fill #2

## 2024-08-30 NOTE — Progress Notes (Signed)
 Cardiology Office Note:    Date:  08/30/2024   ID:  [name], DOB Aug 18, 1956, MRN [id number]  PCP:  Janna Ferrier, DO  Cardiologist:  Lamar Fitch, MD    Referring MD: Janna Ferrier, DO   Chief Complaint  Patient presents with   Follow-up    History of Present Illness:    [name] is a 68 y.o. male past medical history significant for prostate cancer, congestive heart failure previously diminished left ventricular ejection fraction with normalization, coronary CT angio showed possibility of significant hemodynamically lesion in the diagonal branch but very high calcium  score and low distal FFR but with no specific discrete stenosis, comes today to months for follow-up, doing well denies have any chest pain tightness squeezing pressure burning chest, the problem right now is he is knee did give him a lot of problems he is to go to gym and walk for 1 hour on the table now he cannot do it because of pain in the knee.  Denies have any chest pain tightness squeezing pressure mid chest no shortness of breath no other issues from cardiac standpoint review  Past Medical History:  Diagnosis Date   Alcohol abuse    still drink beer   CHF (congestive heart failure) (HCC)    Chronic combined systolic and diastolic heart failure, NYHA class 3 (HCC) 06/02/2012   Chronic kidney disease    stage 3 no nephrologist   Chronic systolic heart failure (HCC) 06/02/2012   Gout    none in years   History of radiation therapy    completed 03-22-2021   Hyperlipidemia    Hypertension    Need for prophylactic vaccination and inoculation against single disease 04/15/2018   NICM (nonischemic cardiomyopathy) (HCC) 09/04/2012   Prostate cancer (HCC) 07/2020   Psoriasis    Possible psoriasis in the past   Pulmonary nodule, right 11/03/2012    Past Surgical History:  Procedure Laterality Date   COLONOSCOPY     CYSTOSCOPY  01/23/2021   Procedure: CYSTOSCOPY FLEXIBLE;  Surgeon: Rosalind Zachary NOVAK, MD;  Location: [facility];  Service: Urology;;  no seeds found in bladder   KNEE SURGERY Left 1970's   arthroscopic knee out of joint   LEFT AND RIGHT HEART CATHETERIZATION WITH CORONARY ANGIOGRAM N/A 05/25/2012   Procedure: LEFT AND RIGHT HEART CATHETERIZATION WITH CORONARY ANGIOGRAM;  Surgeon: Toribio JONELLE Fuel, MD;  Location: [facility] CATH LAB;  Service: Cardiovascular;  Laterality: N/A;   POLYPECTOMY     RADIOACTIVE SEED IMPLANT N/A 01/23/2021   Procedure: RADIOACTIVE SEED IMPLANT/BRACHYTHERAPY IMPLANT;  Surgeon: Rosalind Zachary NOVAK, MD;  Location: [facility];  Service: Urology;  Laterality: N/A;   57  seeds implanted   SKIN GRAFT     L arm got caught in machine in 1979   SPACE OAR INSTILLATION N/A 01/23/2021   Procedure: SPACE OAR INSTILLATION;  Surgeon: Rosalind Zachary NOVAK, MD;  Location: [facility];  Service: Urology;  Laterality: N/A;    Current Medications: Current Meds  Medication Sig   amLODipine  (NORVASC ) 10 MG tablet Take 1 tablet (10 mg total) by mouth daily.   aspirin  EC 81 MG tablet Take 1 tablet (81 mg total) by mouth daily. Swallow whole.   atorvastatin  (LIPITOR) 40 MG tablet Take 1 tablet (40 mg total) by mouth daily.   azelastine  (OPTIVAR ) 0.05 % ophthalmic solution Place 1 drop into both eyes 2 (two) times daily.   carvedilol  (COREG ) 3.125 MG tablet Take 1 tablet  by mouth 2 (two) times daily with a meal.   diclofenac  Sodium (VOLTAREN ) 1 % GEL Apply 2 grams topically 4 (four) times daily as needed for pain.   isosorbide  mononitrate (IMDUR ) 60 MG 24 hr tablet Take 1 tablet (60 mg total) by mouth daily.   losartan  (COZAAR ) 25 MG tablet Take 1 tablet (25 mg total) by mouth at bedtime.   nitroGLYCERIN  (NITROSTAT ) 0.4 MG SL tablet Place 1 tablet (0.4 mg total) under the tongue every 5 (five) minutes as needed for chest pain.   selenium  sulfide (SELSUN ) 2.5 % lotion Apply 1 application topically daily as needed for irritation    tamsulosin  (FLOMAX ) 0.4 MG CAPS capsule Take 1 capsule (0.4 mg total) by mouth daily.   triamcinolone  cream (KENALOG ) 0.5 % Apply 1 Application topically to affected area(s) daily as needed.   Current Facility-Administered Medications for the 08/30/24 encounter (Office Visit) with Mimi Debellis J, MD  Medication   0.9 %  sodium chloride  infusion     Allergies:   Patient has no known allergies.   Social History   Socioeconomic History   Marital status: Legally Separated    Spouse name: Not on file   Number of children: 7   Years of education: 12   Highest education level: 12th grade  Occupational History    Comment: Retired. Formerly disabled due to heart failure.  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 Planning to move out next week. One of his daughters will be coming to stay with him.       Mobile home with some steps, has handrails. Non-working smoke alarms.    Likes to eat meat, pasta, vegetables, and fruit. Sweet tea is what he likes to drink, V8 juice.      Patient sings in choir.       Social Drivers of Corporate Investment Banker Strain: Low Risk  (02/02/2024)   Overall Financial Resource Strain (CARDIA)    Difficulty of Paying Living Expenses: Not hard at all  Food Insecurity: No  Food Insecurity (02/02/2024)   Hunger Vital Sign    Worried About Running Out of Food in the Last Year: Never true    Ran Out of Food in the Last Year: Never true  Transportation Needs: No Transportation Needs (02/02/2024)   PRAPARE - Administrator, Civil Service (Medical): No    Lack of Transportation (Non-Medical): No  Physical Activity: Insufficiently Active (02/02/2024)   Exercise Vital Sign    Days of Exercise per Week: 2 days    Minutes of Exercise per Session: 30 min  Stress: No Stress Concern Present (02/02/2024)   [place]  [employer] of Occupational Health - Occupational Stress Questionnaire    Feeling of Stress : Not at all  Social Connections: Moderately Integrated (02/02/2024)   Social Connection and Isolation Panel    Frequency of Communication with Friends and Family: Once a week    Frequency of Social Gatherings with Friends and Family: More than three times a week    Attends Religious Services: More than 4 times per year    Active Member of [employer] or Organizations: Yes    Attends Banker Meetings: Never    Marital Status: Separated     Family History: The patient's family history includes Breast cancer in his daughter; Cancer in his brother and sister; Colon polyps in his brother and brother; Heart failure in his father and another family member; Prostate cancer in his brother and brother; Stroke in his mother. There is no history of Colon cancer, Esophageal cancer, Rectal cancer, or Stomach cancer. ROS:   Please see the history of present illness.    All 14 point review of systems negative except as described per history of present illness  EKGs/Labs/Other Studies Reviewed:         Recent Labs: 02/05/2024: ALT 16; BNP 78.7; BUN 14; Hemoglobin 12.8; Platelets 211; Potassium 4.7; Sodium 140 05/04/2024: Creatinine, Ser 1.20  Recent Lipid Panel    Component Value Date/Time   CHOL 147 04/27/2024 1131   TRIG 43 04/27/2024 1131   HDL 69 04/27/2024 1131    CHOLHDL 2.1 04/27/2024 1131   CHOLHDL 6.0 04/10/2016 0356   VLDL 23 04/10/2016 0356   LDLCALC 68 04/27/2024 1131    Physical Exam:    VS:  BP (!) 160/84   Pulse 73   Ht 5' 11 (1.803 m)   SpO2 96%   BMI 30.96 kg/m     Wt Readings from Last 3 Encounters:  08/23/24 222 lb (100.7 kg)  07/12/24 222 lb (100.7 kg)  07/01/24 222 lb 6.4 oz (100.9 kg)     GEN:  Well nourished, well developed in no acute distress HEENT: Normal NECK: No JVD; No carotid bruits LYMPHATICS: No lymphadenopathy CARDIAC: RRR, no murmurs, no rubs, no gallops RESPIRATORY:  Clear to auscultation without rales, wheezing or rhonchi  ABDOMEN: Soft, non-tender, non-distended MUSCULOSKELETAL:  No edema; No deformity  SKIN: Warm and dry LOWER EXTREMITIES: no swelling NEUROLOGIC:  Alert and oriented x 3 PSYCHIATRIC:  Normal affect   ASSESSMENT:    1. Coronary artery disease of native artery of native heart with stable angina pectoris   2. Chronic systolic heart failure (HCC)   3. Malignant neoplasm of prostate (HCC)   4. Renal insufficiency    PLAN:    In order of problems listed above:  History of cardiomyopathy, normalization last echocardiogram in December.  Will continue present management.  We do have difficulty putting him on guideline directed medical therapy because of tolerance of medication and kidney dysfunction. Essential hypertension uncontrolled today we will check in before he leaves the room will check Chem-7 if Chem-7 is fine we will double the dose of losartan . Prostate cancer stable. Renal insufficiency again kidney function will be checked, dyslipidemia did review KPN which show me LDL 68 HDL 69 continue present management   Medication Adjustments/Labs and Tests Ordered: Current medicines are reviewed at length with the patient today.  Concerns regarding medicines are outlined above.  No orders of the defined types were placed in this encounter.  Medication changes: No orders of the  defined types were placed  in this encounter.   Signed, Lamar DOROTHA Fitch, MD, [facility] 08/30/2024 10:57 AM    [facility]

## 2024-08-30 NOTE — Patient Instructions (Signed)
Medication Instructions:  Your physician recommends that you continue on your current medications as directed. Please refer to the Current Medication list given to you today.  *If you need a refill on your cardiac medications before your next appointment, please call your pharmacy*   Lab Work: 3rd Floor [address] BMP- today If you have labs (blood work) drawn today and your tests are completely normal, you will receive your results only by: MyChart Message (if you have MyChart) OR A paper copy in the mail If you have any lab test that is abnormal or we need to change your treatment, we will call you to review the results.   Testing/Procedures: None Ordered   Follow-Up: At [facility], you and your health needs are our priority.  As part of our continuing mission to provide you with exceptional heart care, we have created designated Provider Care Teams.  These Care Teams include your primary Cardiologist (physician) and Advanced Practice Providers (APPs -  Physician Assistants and Nurse Practitioners) who all work together to provide you with the care you need, when you need it.  We recommend signing up for the patient portal called "MyChart".  Sign up information is provided on this After Visit Summary.  MyChart is used to connect with patients for Virtual Visits (Telemedicine).  Patients are able to view lab/test results, encounter notes, upcoming appointments, etc.  Non-urgent messages can be sent to your provider as well.   To learn more about what you can do with MyChart, go to [email or web address].com.    Your next appointment:   6 month(s)  The format for your next appointment:   In Person  Provider:   Robert Krasowski, MD    Other Instructions NA  

## 2024-08-30 NOTE — Addendum Note (Signed)
 Addended by: ARLOA PLANAS D on: 08/30/2024 11:10 AM   Modules accepted: Orders

## 2024-08-31 LAB — BASIC METABOLIC PANEL WITH GFR
BUN/Creatinine Ratio: 17 (ref 10–24)
BUN: 17 mg/dL (ref 8–27)
CO2: 27 mmol/L (ref 20–29)
Calcium: 9.4 mg/dL (ref 8.6–10.2)
Chloride: 101 mmol/L (ref 96–106)
Creatinine, Ser: 1.02 mg/dL (ref 0.76–1.27)
Glucose: 115 mg/dL — ABNORMAL HIGH (ref 70–99)
Potassium: 4.7 mmol/L (ref 3.5–5.2)
Sodium: 143 mmol/L (ref 134–144)
eGFR: 79 mL/min/1.73 (ref >59)

## 2024-09-03 ENCOUNTER — Ambulatory Visit: Payer: Self-pay | Admitting: Cardiology

## 2024-09-03 DIAGNOSIS — N183 Chronic kidney disease, stage 3 unspecified: Secondary | ICD-10-CM

## 2024-09-03 DIAGNOSIS — R7989 Other specified abnormal findings of blood chemistry: Secondary | ICD-10-CM

## 2024-09-11 ENCOUNTER — Ambulatory Visit
Admission: RE | Admit: 2024-09-11 | Discharge: 2024-09-11 | Disposition: A | Discharge status: Home / Self Care | Source: Ambulatory Visit | Attending: Family Medicine | Admitting: Family Medicine

## 2024-09-11 DIAGNOSIS — M1711 Unilateral primary osteoarthritis, right knee: Secondary | ICD-10-CM

## 2024-09-11 DIAGNOSIS — M23321 Other meniscus derangements, posterior horn of medial meniscus, right knee: Secondary | ICD-10-CM | POA: Diagnosis not present

## 2024-09-11 DIAGNOSIS — G8929 Other chronic pain: Secondary | ICD-10-CM

## 2024-09-13 ENCOUNTER — Ambulatory Visit: Payer: Self-pay | Admitting: Family Medicine

## 2024-09-13 ENCOUNTER — Other Ambulatory Visit: Payer: Self-pay | Admitting: *Deleted

## 2024-09-13 DIAGNOSIS — M1711 Unilateral primary osteoarthritis, right knee: Secondary | ICD-10-CM

## 2024-09-13 DIAGNOSIS — G8929 Other chronic pain: Secondary | ICD-10-CM

## 2024-09-13 NOTE — Progress Notes (Signed)
 Spoke to patient on the phone 09/13/2022 at 10:50 AM EST.  Called to review MRI results for his knee.  Continues to have ongoing pain, was bothersome over the weekend.  MRI showing complex tear of the posterior horn of the medial meniscus with a flipped fragment in the inferior gutter.  Also has a radial tear of the lateral meniscus.  Mild partial-thickness cartilage loss of the compartment.  At this time he has had limited improvement with prior cortisone injection.  Continues to have pain.  Concerned that flipped fragment may be causing his symptoms.  Will refer to orthopedic surgeon at this time for further evaluation and consideration of possible arthroscopy or other treatment.  I recommend evaluation with Dr. Velinda Chancy with [facility].  Referral will be placed.  He will follow-up with me on an as needed basis after seeing the surgeon.  He expressed understanding and agreement with above.  All questions were answered.  --------------------------------------------- Hello [place]-can you please place a referral to Dr. Velinda Chancy with [facility] for chronic right knee pain with MRI showing complex medial meniscus tear with flipped fragment.  Limited improvement with prior cortisone.  Please eval for possible surgery.  Can you also please send patient a MyChart message with contact information for Dr. Chancy?  Please let me know if you have any questions.  Thanks!  -Rainell

## 2024-09-14 ENCOUNTER — Other Ambulatory Visit (HOSPITAL_COMMUNITY): Payer: Self-pay

## 2024-09-14 MED ORDER — LOSARTAN POTASSIUM 50 MG PO TABS
50.0000 mg | ORAL_TABLET | Freq: Every day | ORAL | 3 refills | Status: AC
  Filled 2024-09-14: qty 90, 90d supply, fill #0

## 2024-09-14 NOTE — Telephone Encounter (Signed)
-----   Message from Lamar Fitch, MD sent at 09/03/2024  9:12 AM EST ----- Chem-7 looks good, increase losartan  to 50 mg daily, Chem-7 next week ----- Message ----- From: Interface, [facility] Lab Results In Sent: 08/31/2024   5:38 AM EST To: Lamar JINNY Fitch, MD

## 2024-09-22 LAB — BASIC METABOLIC PANEL WITH GFR
BUN/Creatinine Ratio: 17 (ref 10–24)
BUN: 23 mg/dL (ref 8–27)
CO2: 23 mmol/L (ref 20–29)
Calcium: 8.8 mg/dL (ref 8.6–10.2)
Chloride: 102 mmol/L (ref 96–106)
Creatinine, Ser: 1.33 mg/dL — ABNORMAL HIGH (ref 0.76–1.27)
Glucose: 134 mg/dL — ABNORMAL HIGH (ref 70–99)
Potassium: 4.5 mmol/L (ref 3.5–5.2)
Sodium: 139 mmol/L (ref 134–144)
eGFR: 58 mL/min/1.73 — ABNORMAL LOW

## 2024-09-27 ENCOUNTER — Other Ambulatory Visit (HOSPITAL_COMMUNITY): Payer: Self-pay

## 2024-09-27 NOTE — Addendum Note (Signed)
 Addended by: GLENFORD ALAN CROME on: 09/27/2024 07:58 AM   Modules accepted: Orders

## 2024-09-29 ENCOUNTER — Other Ambulatory Visit (HOSPITAL_COMMUNITY): Payer: Self-pay

## 2024-10-02 LAB — BASIC METABOLIC PANEL WITH GFR
BUN/Creatinine Ratio: 16 (ref 10–24)
BUN: 16 mg/dL (ref 8–27)
CO2: 25 mmol/L (ref 20–29)
Calcium: 8.9 mg/dL (ref 8.6–10.2)
Chloride: 102 mmol/L (ref 96–106)
Creatinine, Ser: 1.02 mg/dL (ref 0.76–1.27)
Glucose: 100 mg/dL — ABNORMAL HIGH (ref 70–99)
Potassium: 4.6 mmol/L (ref 3.5–5.2)
Sodium: 138 mmol/L (ref 134–144)
eGFR: 79 mL/min/1.73

## 2024-10-04 ENCOUNTER — Other Ambulatory Visit (HOSPITAL_COMMUNITY): Payer: Self-pay

## 2024-11-03 ENCOUNTER — Other Ambulatory Visit: Payer: Self-pay | Admitting: Family Medicine

## 2024-11-04 ENCOUNTER — Other Ambulatory Visit (HOSPITAL_COMMUNITY): Payer: Self-pay

## 2024-11-04 ENCOUNTER — Other Ambulatory Visit: Payer: Self-pay

## 2024-11-04 MED ORDER — SELENIUM SULFIDE 2.5 % EX LOTN
1.0000 | TOPICAL_LOTION | Freq: Every day | CUTANEOUS | 12 refills | Status: AC | PRN
  Filled 2024-11-04: qty 120, 60d supply, fill #0

## 2024-11-04 MED ORDER — CARVEDILOL 3.125 MG PO TABS
3.1250 mg | ORAL_TABLET | Freq: Two times a day (BID) | ORAL | 3 refills | Status: AC
  Filled 2024-11-04 (×2): qty 180, 90d supply, fill #0

## 2025-02-07 ENCOUNTER — Encounter
